# Patient Record
Sex: Female | Born: 1945 | Race: White | Hispanic: No | Marital: Married | Smoking: Never smoker
Health system: Southern US, Community
[De-identification: ages and names within clinical notes are randomized; demographics above are authoritative.]

## PROBLEM LIST (undated history)

## (undated) DIAGNOSIS — R519 Headache, unspecified: Secondary | ICD-10-CM

## (undated) DIAGNOSIS — I5022 Chronic systolic (congestive) heart failure: Secondary | ICD-10-CM

## (undated) DIAGNOSIS — I255 Ischemic cardiomyopathy: Secondary | ICD-10-CM

## (undated) DIAGNOSIS — I2510 Atherosclerotic heart disease of native coronary artery without angina pectoris: Secondary | ICD-10-CM

## (undated) DIAGNOSIS — Z79899 Other long term (current) drug therapy: Secondary | ICD-10-CM

## (undated) DIAGNOSIS — IMO0001 Reserved for inherently not codable concepts without codable children: Secondary | ICD-10-CM

## (undated) DIAGNOSIS — T7840XA Allergy, unspecified, initial encounter: Secondary | ICD-10-CM

## (undated) DIAGNOSIS — I219 Acute myocardial infarction, unspecified: Secondary | ICD-10-CM

## (undated) DIAGNOSIS — L659 Nonscarring hair loss, unspecified: Secondary | ICD-10-CM

## (undated) DIAGNOSIS — Z800 Family history of malignant neoplasm of digestive organs: Secondary | ICD-10-CM

## (undated) DIAGNOSIS — H269 Unspecified cataract: Secondary | ICD-10-CM

## (undated) DIAGNOSIS — E785 Hyperlipidemia, unspecified: Secondary | ICD-10-CM

## (undated) DIAGNOSIS — G4700 Insomnia, unspecified: Secondary | ICD-10-CM

## (undated) DIAGNOSIS — R5381 Other malaise: Secondary | ICD-10-CM

## (undated) DIAGNOSIS — R51 Headache: Secondary | ICD-10-CM

## (undated) DIAGNOSIS — M1990 Unspecified osteoarthritis, unspecified site: Secondary | ICD-10-CM

## (undated) DIAGNOSIS — R5383 Other fatigue: Secondary | ICD-10-CM

## (undated) DIAGNOSIS — Z8249 Family history of ischemic heart disease and other diseases of the circulatory system: Secondary | ICD-10-CM

## (undated) DIAGNOSIS — I10 Essential (primary) hypertension: Secondary | ICD-10-CM

## (undated) HISTORY — DX: Other long term (current) drug therapy: Z79.899

## (undated) HISTORY — DX: Headache: R51

## (undated) HISTORY — DX: Unspecified osteoarthritis, unspecified site: M19.90

## (undated) HISTORY — DX: Insomnia, unspecified: G47.00

## (undated) HISTORY — DX: Essential (primary) hypertension: I10

## (undated) HISTORY — DX: Headache, unspecified: R51.9

## (undated) HISTORY — DX: Other malaise: R53.81

## (undated) HISTORY — DX: Other fatigue: R53.83

## (undated) HISTORY — PX: EYE SURGERY: SHX253

## (undated) HISTORY — DX: Allergy, unspecified, initial encounter: T78.40XA

## (undated) HISTORY — DX: Family history of malignant neoplasm of digestive organs: Z80.0

## (undated) HISTORY — DX: Family history of ischemic heart disease and other diseases of the circulatory system: Z82.49

## (undated) HISTORY — DX: Unspecified cataract: H26.9

## (undated) HISTORY — DX: Hyperlipidemia, unspecified: E78.5

---

## 1974-07-06 HISTORY — PX: DILATION AND CURETTAGE OF UTERUS: SHX78

## 1988-07-06 HISTORY — PX: MASTOIDECTOMY: SHX711

## 1996-07-06 HISTORY — PX: CYSTECTOMY: SHX5119

## 1998-07-17 ENCOUNTER — Other Ambulatory Visit: Admission: RE | Admit: 1998-07-17 | Discharge: 1998-07-17 | Payer: Self-pay | Admitting: Obstetrics and Gynecology

## 1999-03-31 ENCOUNTER — Ambulatory Visit (HOSPITAL_COMMUNITY): Admission: RE | Admit: 1999-03-31 | Discharge: 1999-03-31 | Payer: Self-pay | Admitting: Cardiology

## 1999-07-07 HISTORY — PX: CARDIAC CATHETERIZATION: SHX172

## 1999-10-16 ENCOUNTER — Other Ambulatory Visit: Admission: RE | Admit: 1999-10-16 | Discharge: 1999-10-16 | Payer: Self-pay | Admitting: Obstetrics and Gynecology

## 2000-08-16 ENCOUNTER — Ambulatory Visit (HOSPITAL_COMMUNITY): Admission: RE | Admit: 2000-08-16 | Discharge: 2000-08-16 | Payer: Self-pay | Admitting: Gastroenterology

## 2000-12-01 ENCOUNTER — Other Ambulatory Visit: Admission: RE | Admit: 2000-12-01 | Discharge: 2000-12-01 | Payer: Self-pay | Admitting: Obstetrics and Gynecology

## 2001-12-02 ENCOUNTER — Other Ambulatory Visit: Admission: RE | Admit: 2001-12-02 | Discharge: 2001-12-02 | Payer: Self-pay | Admitting: Obstetrics and Gynecology

## 2002-12-06 ENCOUNTER — Other Ambulatory Visit: Admission: RE | Admit: 2002-12-06 | Discharge: 2002-12-06 | Payer: Self-pay | Admitting: Obstetrics and Gynecology

## 2003-02-02 ENCOUNTER — Encounter: Admission: RE | Admit: 2003-02-02 | Discharge: 2003-02-02 | Payer: Self-pay | Admitting: Family Medicine

## 2003-02-02 ENCOUNTER — Encounter: Payer: Self-pay | Admitting: Family Medicine

## 2003-09-12 ENCOUNTER — Ambulatory Visit (HOSPITAL_COMMUNITY): Admission: RE | Admit: 2003-09-12 | Discharge: 2003-09-12 | Payer: Self-pay | Admitting: Unknown Physician Specialty

## 2003-09-21 ENCOUNTER — Encounter: Admission: RE | Admit: 2003-09-21 | Discharge: 2003-09-21 | Payer: Self-pay | Admitting: Family Medicine

## 2003-09-27 ENCOUNTER — Ambulatory Visit (HOSPITAL_COMMUNITY): Admission: RE | Admit: 2003-09-27 | Discharge: 2003-09-28 | Payer: Self-pay | Admitting: Neurosurgery

## 2003-09-27 HISTORY — PX: CERVICAL SPINE SURGERY: SHX589

## 2003-12-19 ENCOUNTER — Other Ambulatory Visit: Admission: RE | Admit: 2003-12-19 | Discharge: 2003-12-19 | Payer: Self-pay | Admitting: Obstetrics and Gynecology

## 2004-05-16 ENCOUNTER — Encounter: Admission: RE | Admit: 2004-05-16 | Discharge: 2004-05-16 | Payer: Self-pay | Admitting: Family Medicine

## 2004-07-02 ENCOUNTER — Encounter (HOSPITAL_COMMUNITY): Admission: RE | Admit: 2004-07-02 | Discharge: 2004-08-01 | Payer: Self-pay | Admitting: Neurosurgery

## 2004-12-31 ENCOUNTER — Other Ambulatory Visit: Admission: RE | Admit: 2004-12-31 | Discharge: 2004-12-31 | Payer: Self-pay | Admitting: Obstetrics and Gynecology

## 2005-08-05 ENCOUNTER — Ambulatory Visit (HOSPITAL_COMMUNITY): Admission: RE | Admit: 2005-08-05 | Discharge: 2005-08-05 | Payer: Self-pay | Admitting: Family Medicine

## 2006-01-27 ENCOUNTER — Other Ambulatory Visit: Admission: RE | Admit: 2006-01-27 | Discharge: 2006-01-27 | Payer: Self-pay | Admitting: Obstetrics and Gynecology

## 2009-04-30 ENCOUNTER — Encounter: Payer: Self-pay | Admitting: Cardiology

## 2009-05-20 ENCOUNTER — Encounter: Payer: Self-pay | Admitting: Cardiology

## 2009-06-19 DIAGNOSIS — I10 Essential (primary) hypertension: Secondary | ICD-10-CM | POA: Insufficient documentation

## 2009-06-19 DIAGNOSIS — G4700 Insomnia, unspecified: Secondary | ICD-10-CM

## 2009-06-19 DIAGNOSIS — R079 Chest pain, unspecified: Secondary | ICD-10-CM

## 2009-06-20 ENCOUNTER — Encounter: Payer: Self-pay | Admitting: Cardiology

## 2009-07-03 ENCOUNTER — Encounter (INDEPENDENT_AMBULATORY_CARE_PROVIDER_SITE_OTHER): Payer: Self-pay | Admitting: *Deleted

## 2009-07-03 ENCOUNTER — Ambulatory Visit: Payer: Self-pay | Admitting: Cardiology

## 2009-07-03 DIAGNOSIS — R0602 Shortness of breath: Secondary | ICD-10-CM | POA: Insufficient documentation

## 2009-07-08 ENCOUNTER — Ambulatory Visit: Payer: Self-pay | Admitting: Cardiology

## 2009-10-16 ENCOUNTER — Ambulatory Visit: Payer: Self-pay | Admitting: Cardiology

## 2010-08-05 NOTE — Assessment & Plan Note (Signed)
Summary: NP-CHEST PAIN WITH EXERTION   Visit Type:  Initial Consult Primary Provider:  Mauverine Hall,MD  CC:  new patient eval, SOB, and chest pain.  History of Present Illness: the patient is a very pleasant 65 year old female seen by Dr. Margo Aye. The patient has no prior history of coronary artery disease, and has a strong family history. She has an older brother with a heart attack and bypass surgery as well as a younger brother with a myocardial infarction and underwent angioplasty. The patient has very little in the way of risk factors. She does have hypercholesterolemia but her HDL cholesterol is 75 mg percent with an LDL of 120. Based on these data, the patient's Framingham risk for a 10 year heart CHD risk is 4%, with no indication for statin drug therapy the less the LDL cholesterol is greater than 160 mg percent. Of note is also the patient had a cardiac catheterization in 2002 demonstrating no significant coronary artery disease. This procedure was done at the time for chest pain and dyspnea.  The patient's main complaint now is dyspnea which are sitting on him for quite a while. Her dyspnea is both at rest or on exertion. She even describes dyspnea sometimes when turning over in bed. She attributes this to a sedentary lifestyle and a stressful job as an Production designer, theatre/television/film. She does travel in the way of exercise. She also has associated to her increased shortness of breath with weight gain particularly around the waist. She denies however any orthopnea or PND. She has no palpitations or syncope and no exertional chest pain.the patient denies having had a chest X. ray done within the last 5-6 years.   Preventive Screening-Counseling & Management  Alcohol-Tobacco     Smoking Status: never  Current Problems (verified): 1)  Insomnia  (ICD-780.52) 2)  Malaise and Fatigue  (ICD-780.79) 3)  Hormone Replacement Therapy  (ICD-V07.4) 4)  Hypertension  (ICD-401.9) 5)  Chest Pain Unspecified   (ICD-786.50)  Current Medications (verified): 1)  Prempro 0.3-1.5 Mg Tabs (Conj Estrog-Medroxyprogest Ace) .... Take 1 Tablet By Mouth Once A Day 2)  Amlodipine Besylate 5 Mg Tabs (Amlodipine Besylate) .... Take 1 Tablet By Mouth Once A Day 3)  Lunesta 2 Mg Tabs (Eszopiclone) .... 1/4 By Mouth At Bedtime As Needed 4)  Aspir-Low 81 Mg Tbec (Aspirin) .... Take 1 Tablet By Mouth Once A Day 5)  Caltrate 600+d 600-400 Mg-Unit Tabs (Calcium Carbonate-Vitamin D) .... Take 1 Tablet By Mouth Once A Day 6)  B-100  Tabs (Vitamins-Lipotropics) .... Take 1 Tablet By Mouth Once A Day 7)  Vitamin D 2000 Unit Tabs (Cholecalciferol) .... Take 1 Tablet By Mouth Once A Day 8)  Fish Oil Maximum Strength 1200 Mg Caps (Omega-3 Fatty Acids) .... Take 1 Tablet By Mouth Once A Day  Allergies: 1)  ! Demerol 2)  ! Sulfa 3)  ! * Shellfish  Comments:  Nurse/Medical Assistant: The patient's medications and allergies were reviewed with the patient and were updated in the Medication and Allergy Lists. Meds listed on new patient forms.   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) neck surgery with C6-C7 cervical fusion 4-5 yrs ago D&C in 1976 2nd to miscarriage  Family History: Last  updated: 06/19/2009 Mother died age 22 from either MI or stroke father died age 22 (colon cancer) also had a pacemaker father had a stent in leg Brother age 60 who had pancreatic cancer he also had to have a cardiac stent Another brother have cardiac bypass in his 64's positive family hx of thyroid issues in a brother,sister and father  Social History: Last updated: 06/19/2009 Married town Production designer, 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  The patient denies fatigue, malaise, fever, vision loss, decreased hearing, hoarseness, chest pain, palpitations, prolonged cough, wheezing, sleep apnea, coughing up blood, abdominal pain, blood in stool, nausea, vomiting, diarrhea, heartburn, incontinence, blood in urine, muscle weakness, joint pain, leg swelling, rash, skin lesions, headache, fainting, dizziness, depression, anxiety, enlarged lymph nodes, easy bruising or bleeding, and environmental allergies.    Vital Signs:  Patient profile:   65 year old female Height:      65 inches Weight:      152 pounds BMI:     25.39 O2 Sat:      98 % Pulse rate:   60 / minute BP sitting:   143 / 78  (left arm) Cuff size:   regular  Vitals Entered By: Carlye Grippe (July 03, 2009 9:51 AM)  Nutrition Counseling: Patient's BMI is greater than 25 and therefore counseled on weight management options. CC: new patient eval,SOB,chest pain   Physical Exam  General:  Well developed, well nourished, in no acute distress. Head:  normocephalic and atraumatic Eyes:  PERRLA/EOM intact; conjunctiva and lids normal. Mouth:  Teeth, gums and palate normal. Oral mucosa normal. Neck:  Neck supple, no JVD. No masses, thyromegaly or abnormal cervical nodes. Lungs:  Clear bilaterally to auscultation and percussion. Heart:  Non-displaced PMI, chest non-tender; regular rate and rhythm, S1, S2 without murmurs, rubs or gallops. Carotid upstroke normal, no bruit. Normal abdominal aortic size, no bruits. Femorals normal pulses, no bruits. Pedals normal pulses. No edema, no varicosities. Abdomen:  Bowel sounds positive; abdomen soft and non-tender without masses, organomegaly, or hernias noted. No  hepatosplenomegaly. Msk:  Back normal, normal gait. Muscle strength and tone normal. Pulses:  pulses normal in all 4 extremities Extremities:  No clubbing or cyanosis. Neurologic:  Alert and oriented x 3. Skin:  Intact without lesions or rashes. Cervical Nodes:  no significant adenopathy Psych:  Normal affect.   Impression & Recommendations:  Problem # 1:  DYSPNEA (ICD-786.05) the patient reports dyspnea particularly exertion. I doubt this represents ischemic heart disease or primary lung disease. I suspect a large part patient is deconditioned. She does have a strong family history of coronary artery disease in female siblings. However, her Framingham cardiac risk or is still low at 4%. [Risk of myocardial infarction or coronary artery death.]. Based on ATP T. guidelines and her current lipid panel the patient does not meet lipid-lowering therapy. Her pretest probability for coronary artery disease is so low that even coronary artery calcium scoring is not indicated. We will proceed with a exercise echocardiographic study to evaluate the patient's exercise tolerance, make sure she does not have LV dysfunction or underlying heart disease limiting her.we'll also order a chest x-ray PA and lateral Her updated medication list for this problem includes:    Amlodipine Besylate  5 Mg Tabs (Amlodipine besylate) .Marland Kitchen... Take 1 tablet by mouth once a day    Aspir-low 81 Mg Tbec (Aspirin) .Marland Kitchen... Take 1 tablet by mouth once a day  Problem # 2:  HYPERTENSION (ICD-401.9) the patient blood pressure somewhat elevated in the office today. This can be further monitored by Dr. Margo Aye. If it remains elevated I would suggest obtaining a plasma renin activity level which will help guide the appropriate therapy with vasodilator therapy versus diuretic treatment. Her updated medication list for this problem includes:    Amlodipine Besylate 5 Mg Tabs (Amlodipine besylate) .Marland Kitchen... Take 1 tablet by mouth once a day    Aspir-low  81 Mg Tbec (Aspirin) .Marland Kitchen... Take 1 tablet by mouth once a day  Problem # 3:  HORMONE REPLACEMENT THERAPY (ICD-V07.4) Assessment: Comment Only  Other Orders: T-Chest x-ray, 2 views (71020) Echo- Stress (Stress Echo)  Patient Instructions: 1)  Stress Echo 2)  Chest X-ray - PA & Lat. 3)  If all testing normal, okay to cancell follow up appt.  4)  Follow up in  3 months.

## 2010-08-05 NOTE — Letter (Signed)
Summary: Internal Correspondence/ PATIENT HISTORY FORM  Internal Correspondence/ PATIENT HISTORY FORM   Imported By: Dorise Hiss 07/11/2009 14:11:21  _____________________________________________________________________  External Attachment:    Type:   Image     Comment:   External Document

## 2010-08-05 NOTE — Assessment & Plan Note (Signed)
Summary: 3 MONTH F/U PER REMINDER-JM   Visit Type:  Follow-up Primary Provider:  Mauverine Hall,MD  CC:  shortness of breath.  History of Present Illness: the patient is a 65 year old female with no prior stroke coronary arteries but strong family history of CAD. Basal prior evaluation in December calculated the patient's Framingham cardiac risk or at 4%. She did not meet guidelines for lipid-lowering therapy. She underwent a stress echocardiogram which was within normal limits. Ejection fraction is 55-60% and although there were EKG changes suggestive of ischemia the echocardiographic images were normal.  The patient now reports no recurrent chest pain shortness of breath orthopnea PND. She has no palpitations or syncope. She stable from a cardiovascular perspective.  Current Problems (verified): 1)  Dyspnea  (ICD-786.05) 2)  Insomnia  (ICD-780.52) 3)  Malaise and Fatigue  (ICD-780.79) 4)  Hormone Replacement Therapy  (ICD-V07.4) 5)  Hypertension  (ICD-401.9) 6)  Chest Pain Unspecified  (ICD-786.50)  Current Medications (verified): 1)  Prempro 0.3-1.5 Mg Tabs (Conj Estrog-Medroxyprogest Ace) .... Take 1 Tablet By Mouth Once A Day 2)  Amlodipine Besylate 5 Mg Tabs (Amlodipine Besylate) .... Take 1 Tablet By Mouth Once A Day 3)  Lunesta 2 Mg Tabs (Eszopiclone) .... 1/4 By Mouth At Bedtime As Needed 4)  Aspir-Low 81 Mg Tbec (Aspirin) .... Take 1 Tablet By Mouth Once A Day 5)  Caltrate 600+d 600-400 Mg-Unit Tabs (Calcium Carbonate-Vitamin D) .... Take 1 Tablet By Mouth Once A Day 6)  B-100  Tabs (Vitamins-Lipotropics) .... Take 1 Tablet By Mouth Once A Day 7)  Vitamin D 2000 Unit Tabs (Cholecalciferol) .... Take 1 Tablet By Mouth Once A Day 8)  Fish Oil Maximum Strength 1200 Mg Caps (Omega-3 Fatty Acids) .... Take 1 Tablet By Mouth Once A Day 9)  Ocuvite .Marland Kitchen.. 1 By Mouth Daily  Allergies (verified): 1)  ! Demerol 2)  ! Sulfa 3)  ! 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) neck surgery with C6-C7 cervical fusion 4-5 yrs ago D&C in 1976 2nd to miscarriage  Family History: Last updated: Jul 18, 2009 Mother died age 25 from either MI or stroke father died age 66 (colon cancer) also had a pacemaker father had a stent in leg Brother age 84 who had pancreatic cancer he also had to have a cardiac stent Another brother have cardiac bypass in his 75's positive family hx of thyroid issues in a brother,sister and father  Social History: Last updated: Jul 18, 2009 Married town Production designer, 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[place], [place]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 rare glass of wine no drugs Denies no tobacco  Risk Factors: Smoking Status: never (07/03/2009)  Vital Signs:  Patient profile:   65 year old female Height:      65 inches Weight:      154 pounds BMI:     25.72 Pulse rate:   55 / minute Resp:     16 per minute BP sitting:   145 / 72  (left arm)  Vitals Entered By: Marrion Coy, CNA (October 16, 2009 8:32 AM) CC: shortness of breath   Physical Exam  Additional Exam:  General: Well-developed, well-nourished in no distress head: Normocephalic and atraumatic eyes PERRLA/EOMI intact, conjunctiva and lids normal nose:  No deformity or lesions mouth normal dentition, normal posterior pharynx neck: Supple, no JVD.  No masses, thyromegaly or abnormal cervical nodes lungs: Normal breath sounds bilaterally without wheezing.  Normal percussion heart: regular rate and rhythm with normal S1 and S2, no S3 or S4.  PMI is normal.  No pathological murmurs abdomen: Normal bowel sounds, abdomen is soft and nontender without masses, organomegaly or hernias noted.  No  hepatosplenomegaly musculoskeletal: Back normal, normal gait muscle strength and tone normal pulsus: Pulse is normal in all 4 extremities Extremities: No peripheral pitting edema neurologic: Alert and oriented x 3 skin: Intact without lesions or rashes cervical nodes: No significant adenopathy psychologic: Normal affect    Impression & Recommendations:  Problem # 1:  DYSPNEA (ICD-786.05) improved. No evidence of ischemia by stress echocardiography. Her updated medication list for this problem includes:    Amlodipine Besylate 5 Mg Tabs (Amlodipine besylate) .Marland Kitchen... Take 1 tablet by mouth once a day    Aspir-low 81 Mg Tbec (Aspirin) .Marland Kitchen... Take 1 tablet by mouth once a day  Problem # 2:  HYPERTENSION (ICD-401.9) Assessment: Comment Only  Her updated medication list for this problem includes:    Amlodipine Besylate 5 Mg Tabs (Amlodipine besylate) .Marland Kitchen... Take 1 tablet by mouth once a day    Aspir-low 81 Mg Tbec (Aspirin) .Marland Kitchen... Take 1 tablet by mouth once a day  Problem # 3:  CHEST PAIN UNSPECIFIED (ICD-786.50) resolved. Her updated medication list for this problem includes:    Amlodipine Besylate 5 Mg Tabs (Amlodipine besylate) .Marland Kitchen... Take 1 tablet by mouth once a day    Aspir-low 81 Mg Tbec (Aspirin) .Marland Kitchen... Take 1 tablet by mouth once a day  Patient Instructions: 1)  Your physician recommends that you continue on your current medications as directed. Please refer to the Current Medication list given to you today. 2)  No follow up needed.

## 2010-08-13 ENCOUNTER — Telehealth (INDEPENDENT_AMBULATORY_CARE_PROVIDER_SITE_OTHER): Payer: Self-pay | Admitting: *Deleted

## 2010-08-21 NOTE — Progress Notes (Signed)
  Phone Note Other Incoming   Request: Send information Summary of Call: Request for records received from [facility] processing center. Request forwarded to [facility].  5 yrs

## 2010-11-21 NOTE — Op Note (Signed)
NAME:  Kelli Roy, Kelli Roy                           ACCOUNT NO.:  [id number]   MEDICAL RECORD NO.:  [id number]                   PATIENT TYPE:  OIB   LOCATION:  3006                                 FACILITY:  [facility]   PHYSICIAN:  Clydene Fake, M.D.               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   PROCEDURE:  Anterior cervical decompression, diskectomy and fusion, C4-5  with left __________ allograft with tether anterior cervical plate.   SURGEON:  Clydene Fake, M.D.   ASSISTANT:  Hewitt Shorts, M.D.   ANESTHESIA:  General endotracheal tube.   ESTIMATED BLOOD LOSS:  Minimal.   BLOOD GIVEN:  None.   DRAINS:  None.   COMPLICATIONS:  None.   INDICATIONS FOR PROCEDURE:  The patient is a 65 year old woman who had neck,  left shoulder pain, right arm pain and weakness, __________, disk  herniation, left side, C4-5.  The patient did have significant weakness of  the left deltoid radiculopathy.   PROCEDURE IN DETAIL:  The patient was brought to the operating room, general  anesthesia induced. The patient was placed in halter traction, prepped and  draped in the sterile fashion.  The site of incision was injected with 10 cc  of Lidocaine with epinephrine.  An incision was then made from the midline  to the anterior border of the sternocleidomastoid muscle on the left side of  the neck.  The incision was taken down to the platysma and hemostasis was  obtained with Bovie cauterization.  The platysma was incised with the Bovie  and blunt dissection was taken through the anterior cervical fascia to the  anterior cervical spine.  The needle was placed in the interspace, x-ray was  obtained showing this was the 4-5 interspace.  The disk space was incised  with a 15 blade as the needle was removed.  The longus colli muscles were  reflected laterally on each side using the Bovie and self-retaining  retractor system was then placed.  Disk space was then again incised with  the 15 blade and diskectomy performed with pituitary rongeurs and curet's.  Anterior osteophytes were removed with Leksell rongeurs and distraction pins  placed at the C4-5 and the interspace distracted.  Curet's, 1 and 2 mm  Kerrison punches were then used to continue the diskectomy and remove  posterior ligaments and posterior disk herniation, posterior osteophytes.  Bilateral foraminotomies were then performed.  Large fragments of disk were  removed from the left side over the nerve root to where when finished good  central and lateral decompression.  Hemostasis obtained with Gelfoam and  thrombin.  Gelfoam was then irrigated out.  A high speed drill was used to  remove the cartilaginous end plates.  The height of the disk space was  measured.  __________ bone trials measured to be 6 mm.  A 6 mm allograft  bone was then tapped in place, countersinking it about a millimeter or so.  We checked posterior to the graft and there was plenty of room between bone  graft and dura on each side.  Distraction was removed, along with the  distraction pins.  Hemostasis obtained with Gelfoam and thrombin.  Weight  was removed from the traction.  A tethered anterior cervical plate was  placed over the anterior cervical spine, two screws were placed in the C4  and two in the C5.  These were locked down.  Self-retaining retractors were  removed.  Hemostasis obtained with Bipolar cauterization and Gelfoam with  thrombin.  Gelfoam was irrigated out with good hemostasis.  We started the  closure.  X-rays obtained, showing good position of bone plate and screws at  the C4-5 level.  The platysma was closed with 3-0 Vicryl  interrupted suture,  subcutaneous tissue was closed with the same, and the skin closed with  Benzoin and Steri-Strips.  A dressing was placed.  The patient was placed in  a soft cervical collar, awakened from anesthesia and taken to recovery room  in stable condition.                                               Clydene Fake, M.D.    JRH/MEDQ  D:  09/27/2003  T:  09/28/2003  Job:  [id number]

## 2010-11-21 NOTE — Procedures (Signed)
[place]  Patient:    Kelli Roy, Kelli Roy                       MRN: [id number] Proc. Date: 08/16/00 Adm. Date:  08/16/00 Attending:  Verlin Roy, M.D. CC:         Kelli Roy, M.D.   Procedure Report  DATE OF BIRTH:  1946/04/26  REFERRING PHYSICIAN:  Desma Roy, M.D.  PROCEDURE PERFORMED:  Colonoscopy.  ENDOSCOPIST:  Kelli Roy, M.D.  INDICATIONS FOR PROCEDURE:  The patient is a 65 year old female who is referred to me by Dr. Donia Roy to evaluate intermittent hematochezia. Ms. Kelli Roy has been evaluated by Dr. Hassie Roy approximately two years ago and placed on nitroglycerin ointment to heal a fissure.  Ms. Kelli Roy 38 year old brother was recently diagnosed with rectal cancer.  Ms. Kelli Roy viewed our colonoscopy education film.  I discussed with her the complications associated with colonoscopy and polypectomy including intestinal bleeding and intestinal perforation.  Ms. Kelli Roy has signed the operative permit.  CHRONIC MEDICATIONS:  Prempro for hormone replacement therapy.  BC powder for headaches.  Sudafed for sinus congestion.  PAST MEDICAL HISTORY:  Mastectomy in 1989, fibroid cyst of the uterus requiring surgery, anal fissures.  FAMILY HISTORY:  A 17 year old brother diagnosed with rectal cancer a few months ago.  SOCIAL HISTORY:  Ms. Kelli Roy husband is in good health and her 2 year old son is in good health. I discussed with the patient the complications associated with colonoscopy and polypectomy including intestinal bleeding and intestinal perforation.  The patient has signed the operative permit.  PREMEDICATION:  Fentanyl 25 mcg, Versed 5 mg.  ENDOSCOPE:  Olympus pediatric colonoscope.  DESCRIPTION OF PROCEDURE:  After obtaining informed consent, the patient was placed in the left lateral decubitus position.  I administered intravenous fentanyl and intravenous Versed to achieve  sedation for the procedure.  The patients blood pressure, oxygen saturation and cardiac rhythm were monitored throughout the procedure and documented in the medical record.  Anal inspection was normal.  Digital rectal exam was normal.  I did not detect an anal fissure.  The Olympus pediatric colonoscope was then introduced into the rectum and under direct vision, advanced to the cecum as identified by a normal-appearing ileocecal valve.  Colonic preparation for the exam today was excellent.  Rectum:  Normal.  Sigmoid colon:  Normal.  Descending colon:  Normal.  Splenic flexure:  Normal.  Transverse colon:  Normal.  Hepatic flexure:  Normal.  Ascending colon:  Normal.  Cecum and ileocecal valve:  Normal.  ASSESSMENT:  Normal proctocolonoscopy to the cecum.  No endoscopic evidence of for the presence of colorectal neoplasia.  RECOMMENDATIONS:  Repeat colonoscopy in approximately five years based on her brothers diagnosis of rectal cancer. DD:  08/16/00 TD:  08/16/00 Job: [id number]

## 2011-05-25 LAB — HM COLONOSCOPY: HM Colonoscopy: NORMAL

## 2011-08-14 ENCOUNTER — Ambulatory Visit (HOSPITAL_COMMUNITY)
Admission: RE | Admit: 2011-08-14 | Discharge: 2011-08-14 | Disposition: A | Discharge status: Home / Self Care | Payer: BC Managed Care – PPO | Source: Ambulatory Visit | Attending: Internal Medicine | Admitting: Internal Medicine

## 2011-08-14 DIAGNOSIS — M25619 Stiffness of unspecified shoulder, not elsewhere classified: Secondary | ICD-10-CM | POA: Insufficient documentation

## 2011-08-14 DIAGNOSIS — M6281 Muscle weakness (generalized): Secondary | ICD-10-CM | POA: Insufficient documentation

## 2011-08-14 DIAGNOSIS — M25519 Pain in unspecified shoulder: Secondary | ICD-10-CM | POA: Insufficient documentation

## 2011-08-14 DIAGNOSIS — IMO0001 Reserved for inherently not codable concepts without codable children: Secondary | ICD-10-CM | POA: Insufficient documentation

## 2011-08-14 NOTE — Evaluation (Signed)
Occupational Therapy Evaluation  Patient Details  Name: Kelli Roy MRN: [id number] Date of Birth: 1945-11-11  Today's Date: 08/14/2011 Time: 4098-1191 Time Calculation (min): 45 min  Visit#: 1  of 8   Re-eval: 09/11/11  Assessment Diagnosis: Bilateral [place] Bursitis Next MD Visit: August , 2013 Prior Therapy: None  Past Medical History: No past medical history on file. Past Surgical History: No past surgical history on file.  Subjective Symptoms/Limitations Symptoms: S: When I start to go back behind my head. back and neck it get stiff, and hurts a little. Limitations: Ms. Sisk states she had a fall onto left side on 05/2010 and another fall OCt. 2012 on the right side. She states she also had cervical neck surgery to repair a disc approximately 6-7 years ago. She first noticed decreased function in her arms after last fall in October of 2012. Pain Assessment Currently in Pain?: Yes Pain Score:   1 Pain Location: Shoulder Pain Orientation: Right;Left Pain Radiating Towards: Right neck Pain Onset: More than a month ago Pain Frequency: Intermittent Pain Relieving Factors: rest Effect of Pain on Daily Activities: Pain only with end range of ER and elevation. Multiple Pain Sites: No  Precautions/Restrictions  Precautions Precautions:  (None) Restrictions Weight Bearing Restrictions: No  Prior Functioning  Home Living Lives With: Spouse Receives Help From: Family Type of Home: House Home Layout: Two level;Bed/bath upstairs Alternate Level Stairs-Rails: None Alternate Level Stairs-Number of Steps: 3 to get in and 12 inside Home Access: Stairs to enter Entrance Stairs-Rails: None Entrance Stairs-Number of Steps: 2 Bathroom Shower/Tub: Electrical engineer Equipment: None Prior Function Level of Independence: Independent with basic ADLs;Independent with homemaking with ambulation;Independent with gait;Independent with  transfers Able to Take Stairs?: Yes Driving: Yes Vocation: Full time employment 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[employer]) Leisure: Hobbies-no  Assessment ADL/Vision/Perception Perception Perception: Within Functional Limits Praxis Praxis: Intact  Cognition/Observation Cognition Overall Cognitive Status: Appears within functional limits for tasks assessed Arousal/Alertness: Awake/alert Orientation Level: Oriented X4 Safety/Judgment: Appears intact  Sensation/Coordination/Edema  Intact  Additional Assessments RUE Assessment RUE Assessment: Exceptions to [facility] RUE AROM (degrees) Right Shoulder Extension  0-60: 65 Degrees Right Shoulder Flexion  0-170: 142 Degrees Right Shoulder ABduction 0-140: 132 Degrees Right Shoulder Internal Rotation  0-70: 70 Degrees Right Shoulder External Rotation  0-90: 70 Degrees RUE PROM (degrees) Right Shoulder Flexion  0-170: 175 Degrees Right Shoulder ABduction 0-140: 178 Degrees RUE Strength Right Shoulder Flexion: 3+/5 Right Shoulder ABduction: 3+/5 Right Shoulder External Rotation: 4/5 LUE Assessment LUE Assessment: Exceptions to WFL LUE AROM (degrees) Left Shoulder Extension  0-60: 60 Degrees Left Shoulder Flexion  0-170: 140 Degrees Left Shoulder ABduction 0-40: 115 Degrees Left Shoulder Internal Rotation  0-70: 69 Degrees Left Shoulder External Rotation  0-90: 70 Degrees LUE PROM (degrees) Left Shoulder Flexion  0-170: 175 Degrees Left Shoulder ABduction 0-40: 150 Degrees LUE Strength Left Shoulder Flexion: 3+/5 Left Shoulder Extension: 4/5 Left Shoulder ABduction: 3+/5 Palpation Palpation: Patient demonstrates restrictions around scapula      Occupational Therapy Assessment and Plan OT Assessment and Plan Clinical Impression Statement: A: Patient demonstrates tight Scapular and humeral movement with pain at end range of AROM. PROM WNL. ER 4/5 bilaterally IR Flex ABD 3/5 Rehab Potential:  Excellent OT Frequency: Min 2X/week OT Duration: 4 weeks OT Treatment/Interventions: Self-care/ADL training;Therapeutic exercise;Therapeutic activities;Patient/family education;Manual therapy;Modalities OT Plan: P: Skilled OT requred to restore mobility and strength and decrease pain in BUE shoulders and to train  in a HEP for same.   Goals Home Exercise Program Pt will Perform Home Exercise Program: Independently Short Term Goals Time to Complete Short Term Goals: 2 weeks Short Term Goal 1: Patient will be independnt with AAROM and isometrics. Short Term Goal 2: Pateint will state no pain on end range movement into forward flexion and abduction. Short Term Goal 3: Patient will regain 20 degrees AROM of Flex and ABD. Short Term Goal 4: Patient will be able to wash her own hair and put on jewlry with no pain Short Term Goal 5: Patient will demonstrate increased strength in shoulder Flex / Abd to 4/5 Long Term Goals Long Term Goal 1: Patient will be independent with t-band exercises and AAROM and PROM of shoulders. Long Term Goal 2: Pateint will demonstrate 4+/5 Shoulder strength Flex and ABD/ IR/ ER. Long Term Goal 3: Patient will return to full functional use of shoulders reaching above head for light objects without pain. Long Term Goal 4: Patient will have decreased scapular restrictions from Mod to [place].  Problem List Patient Active Problem List  Diagnoses  . HYPERTENSION  . INSOMNIA  . MALAISE AND FATIGUE  . DYSPNEA  . CHEST PAIN UNSPECIFIED    End of Session Activity Tolerance: Patient tolerated treatment well General Behavior During Session: [facility] for tasks performed Cognition: [facility] for tasks performed   Lisa Roca OTR/L 08/14/2011, 12:05 PM  Physician Documentation Your signature is required to indicate approval of the treatment plan as stated above.  Please sign and either send electronically or make a copy of this report for your files and return this physician  signed original.  Please mark one 1.__approve of plan  2. ___approve of plan with the following conditions.   ______________________________                                                          _____________________ Physician Signature                                                                                                             Date

## 2011-08-17 ENCOUNTER — Ambulatory Visit (HOSPITAL_COMMUNITY): Payer: BC Managed Care – PPO | Admitting: Occupational Therapy

## 2011-08-18 ENCOUNTER — Telehealth (HOSPITAL_COMMUNITY): Payer: Self-pay

## 2011-08-18 ENCOUNTER — Ambulatory Visit (HOSPITAL_COMMUNITY): Payer: BC Managed Care – PPO | Admitting: Occupational Therapy

## 2011-08-20 ENCOUNTER — Ambulatory Visit (HOSPITAL_COMMUNITY): Payer: BC Managed Care – PPO | Admitting: Occupational Therapy

## 2011-08-24 ENCOUNTER — Ambulatory Visit (HOSPITAL_COMMUNITY): Payer: BC Managed Care – PPO | Admitting: Occupational Therapy

## 2011-08-27 ENCOUNTER — Ambulatory Visit (HOSPITAL_COMMUNITY)
Admission: RE | Admit: 2011-08-27 | Discharge: 2011-08-27 | Disposition: A | Discharge status: Home / Self Care | Payer: BC Managed Care – PPO | Source: Ambulatory Visit | Attending: Internal Medicine | Admitting: Internal Medicine

## 2011-08-27 NOTE — Progress Notes (Signed)
Occupational Therapy Treatment  Patient Details  Name: JEREMIE ABDELAZIZ MRN: [id number] Date of Birth: 02-21-1946  Today's Date: 08/27/2011 Time: 1100-1145 Therapeutic Exercise 45 min. Time Calculation (min): 45 min  Visit#: 2  of 8   Re-eval: 09/11/11   Subjective Symptoms/Limitations Symptoms: S: I was unable to come due to a sinus infection, and had so things come upat work that made it hard to get away." Repetition: Decreases Symptoms Pain Assessment Currently in Pain?: Yes Pain Score:   1 Pain Location: Shoulder Pain Orientation: Right;Left Pain Onset: More than a month ago Pain Frequency: Rarely Pain Relieving Factors: Rest  Precautions/Restrictions None    Exercise/Treatments Supine Protraction: AAROM;AROM;10 reps;PROM Horizontal ABduction: AAROM;AROM;10 reps;PROM External Rotation: AAROM;AROM;10 reps;PROM Internal Rotation: AAROM;AROM;10 reps;PROM Flexion: AROM;AAROM;10 reps;PROM ABduction: AROM;AAROM;10 reps;PROM Ball Flexion: 20 reps ABduction: 20 reps Right/Left: 5 reps ROM / Strengthening / Isometric Strengthening UBE (Upper Arm Bike): 3' forward 3' backward. level1 Wall Wash: 2'  Occupational Therapy Assessment and Plan OT Assessment and Plan Clinical Impression Statement: A: Decreased tightness and decreased discomfort today throughout range bilateral shoulders. Patient given supine AROM and wall wash and ball AAROM seated. Rehab Potential: Excellent OT Frequency: Min 2X/week OT Duration: 4 weeks OT Treatment/Interventions: Self-care/ADL training;Therapeutic exercise;Manual therapy;Modalities;Therapeutic activities;Patient/family education OT Plan: P: Begin training in sometrics and continue scapular strengthening.   Goals Home Exercise Program Pt will Perform Home Exercise Program: Independently Short Term Goals Time to Complete Short Term Goals: 2 weeks Short Term Goal 1: Patient will be independnt with AAROM and isometrics. Short Term Goal 2:  Pateint will state no pain on end range movement into forward flexion and abduction. Short Term Goal 3: Patient will regain 20 degrees AROM of Flex and ABD. Short Term Goal 4: Patient will be able to wash her own hair and put on jewlry with no pain Short Term Goal 5: Patient will demonstrate increased strength in shoulder Flex / Abd to 4/5 Long Term Goals Long Term Goal 1: Patient will be independent with t-band exercises and AAROM and PROM of shoulders. Long Term Goal 2: Pateint will demonstrate 4+/5 Shoulder strength Flex and ABD/ IR/ ER. Long Term Goal 3: Patient will return to full functional use of shoulders reaching above head for light objects without pain. Long Term Goal 4: Patient will have decreased scapular restrictions from Mod to [place].  Problem List Patient Active Problem List  Diagnoses  . HYPERTENSION  . INSOMNIA  . MALAISE AND FATIGUE  . DYSPNEA  . CHEST PAIN UNSPECIFIED    End of Session Activity Tolerance: Patient tolerated treatment well General Behavior During Session: [facility] for tasks performed Cognition: [facility] for tasks performed  Lisa Roca OTR/L 08/27/2011, 11:52 AM

## 2011-08-31 ENCOUNTER — Ambulatory Visit (HOSPITAL_COMMUNITY)
Admission: RE | Admit: 2011-08-31 | Discharge: 2011-08-31 | Disposition: A | Discharge status: Home / Self Care | Payer: BC Managed Care – PPO | Source: Ambulatory Visit | Attending: Internal Medicine | Admitting: Internal Medicine

## 2011-08-31 NOTE — Progress Notes (Signed)
Occupational Therapy Treatment  Patient Details  Name: Kelli Roy MRN: [id number] Date of Birth: 07/20/1945  Today's Date: 08/31/2011 Time: 4098-1191 Time Calculation (min): 58 min  Visit#: 3  of 8   Re-eval: 09/11/11 Manual Therapy [phone] 27' Therapeutic Exercise [id number] 30'    Subjective Symptoms/Limitations Symptoms: S: I am more limited by my range of motion.  Precautions/Restrictions     Exercise/Treatments Supine Protraction: PROM;10 reps;AAROM;15 reps Horizontal ABduction: PROM;10 reps;AAROM;15 reps External Rotation: PROM;10 reps;AAROM;15 reps Internal Rotation: PROM;10 reps;AAROM;15 reps Flexion: PROM;10 reps;AAROM;15 reps ABduction: PROM;10 reps;AAROM;15 reps Seated Elevation: AROM;10 reps Extension: AROM;10 reps Retraction: AROM;10 reps Row: AROM;10 reps Therapy Ball Flexion: 20 reps ABduction: 20 reps Right/Left: 5 reps ROM / Strengthening / Isometric Strengthening UBE (Upper Arm Bike): 3' forward 3' backward. level1.5 Wall Wash: 2' Thumb Tacks: 1'      Manual Therapy Manual Therapy: Myofascial release Myofascial Release: MFR and manual stretching to bilateral shoulders to decrease restrictions along scapula, upper trap and upper arm to decrease pain and increase AROM.  Occupational Therapy Assessment and Plan OT Assessment and Plan Clinical Impression Statement: A:  Added multiple new exercises which patient tolerated well. Rehab Potential: Excellent OT Plan: P:  Add tband for scapular strengthening.   Goals Home Exercise Program Pt will Perform Home Exercise Program: Independently Short Term Goals Time to Complete Short Term Goals: 2 weeks Short Term Goal 1: Patient will be independnt with AAROM and isometrics. Short Term Goal 2: Pateint will state no pain on end range movement into forward flexion and abduction. Short Term Goal 3: Patient will regain 20 degrees AROM of Flex and ABD. Short Term Goal 4: Patient will be able to wash her  own hair and put on jewlry with no pain Short Term Goal 5: Patient will demonstrate increased strength in shoulder Flex / Abd to 4/5 Long Term Goals Long Term Goal 1: Patient will be independent with t-band exercises and AAROM and PROM of shoulders. Long Term Goal 2: Pateint will demonstrate 4+/5 Shoulder strength Flex and ABD/ IR/ ER. Long Term Goal 3: Patient will return to full functional use of shoulders reaching above head for light objects without pain. Long Term Goal 4: Patient will have decreased scapular restrictions from Mod to [place].  Problem List Patient Active Problem List  Diagnoses  . HYPERTENSION  . INSOMNIA  . MALAISE AND FATIGUE  . DYSPNEA  . CHEST PAIN UNSPECIFIED    End of Session Activity Tolerance: Patient tolerated treatment well General Behavior During Session: [facility] for tasks performed Cognition: [facility] for tasks performed  GO No functional reporting required   Deneka Greenwalt L. Noralee Stain, COTA/L  08/31/2011, 5:56 PM

## 2011-09-03 ENCOUNTER — Ambulatory Visit (HOSPITAL_COMMUNITY): Payer: BC Managed Care – PPO | Admitting: Occupational Therapy

## 2011-09-07 ENCOUNTER — Ambulatory Visit (HOSPITAL_COMMUNITY)
Admission: RE | Admit: 2011-09-07 | Discharge: 2011-09-07 | Disposition: A | Discharge status: Home / Self Care | Payer: BC Managed Care – PPO | Source: Ambulatory Visit | Attending: Internal Medicine | Admitting: Internal Medicine

## 2011-09-07 DIAGNOSIS — M25519 Pain in unspecified shoulder: Secondary | ICD-10-CM | POA: Insufficient documentation

## 2011-09-07 DIAGNOSIS — IMO0001 Reserved for inherently not codable concepts without codable children: Secondary | ICD-10-CM | POA: Insufficient documentation

## 2011-09-07 DIAGNOSIS — M25619 Stiffness of unspecified shoulder, not elsewhere classified: Secondary | ICD-10-CM | POA: Insufficient documentation

## 2011-09-07 DIAGNOSIS — M6281 Muscle weakness (generalized): Secondary | ICD-10-CM | POA: Insufficient documentation

## 2011-09-07 NOTE — Progress Notes (Signed)
Occupational Therapy Treatment  Patient Details  Name: Kelli Roy MRN: [id number] Date of Birth: [birth year]  Today's Date: 09/07/2011 Time: 8295-6213 Time Calculation (min): 44 min  Visit#: 4  of 8   Re-eval: 09/11/11 Manual Therapy [phone] 18' Therapeutic Exercise [id number] 25'    Subjective Symptoms/Limitations Symptoms: S:  I feel ok.  The ROM does not seem quite as stiff. Pain Assessment Currently in Pain?: No/denies  Precautions/Restrictions     Exercise/Treatments Supine Protraction: PROM;10 reps;AAROM;15 reps Horizontal ABduction: PROM;10 reps;AAROM;15 reps External Rotation: PROM;10 reps;AAROM;15 reps Internal Rotation: PROM;10 reps;AAROM;15 reps Flexion: PROM;10 reps;AAROM;15 reps ABduction: PROM;10 reps;AAROM;15 reps Seated Extension: Theraband;10 reps Theraband Level (Shoulder Extension): Level 3 (Green) Retraction: Theraband;10 reps Theraband Level (Shoulder Retraction): Level 3 (Green) Row: Theraband;10 reps Theraband Level (Shoulder Row): Level 3 (Green) Therapy Ball Flexion: 20 reps ABduction: 20 reps Right/Left: 5 reps ROM / Strengthening / Isometric Strengthening UBE (Upper Arm Bike): 3' forward 3' backward. level1.5 Wall Wash: 3' Thumb Tacks: 1'          Manual Therapy Manual Therapy: Myofascial release Myofascial Release: MFR and manual stretching to bilateral shoulders to decrease restrictions along scapula, upper trap and upper arm to decrease pain and increase AROM.  Occupational Therapy Assessment and Plan OT Assessment and Plan Clinical Impression Statement: A:  Added tband to scapular strengthening.  Good PROM Rehab Potential: Excellent OT Plan: P:  switch from AAROM to AROM supine.   Goals Home Exercise Program Pt will Perform Home Exercise Program: Independently Short Term Goals Time to Complete Short Term Goals: 2 weeks Short Term Goal 1: Patient will be independnt with AAROM and isometrics. Short Term Goal 2: Pateint  will state no pain on end range movement into forward flexion and abduction. Short Term Goal 3: Patient will regain 20 degrees AROM of Flex and ABD. Short Term Goal 4: Patient will be able to wash her own hair and put on jewlry with no pain Short Term Goal 5: Patient will demonstrate increased strength in shoulder Flex / Abd to 4/5 Long Term Goals Long Term Goal 1: Patient will be independent with t-band exercises and AAROM and PROM of shoulders. Long Term Goal 2: Pateint will demonstrate 4+/5 Shoulder strength Flex and ABD/ IR/ ER. Long Term Goal 3: Patient will return to full functional use of shoulders reaching above head for light objects without pain. Long Term Goal 4: Patient will have decreased scapular restrictions from Mod to [place].  Problem List Patient Active Problem List  Diagnoses  . HYPERTENSION  . INSOMNIA  . MALAISE AND FATIGUE  . DYSPNEA  . CHEST PAIN UNSPECIFIED    End of Session Activity Tolerance: Patient tolerated treatment well General Behavior During Session: [facility] for tasks performed Cognition: [facility] for tasks performed  GO No functional reporting required   Erdem Naas L. Noralee Stain, COTA/L  09/07/2011, 6:16 PM

## 2011-09-10 ENCOUNTER — Ambulatory Visit (HOSPITAL_COMMUNITY)
Admission: RE | Admit: 2011-09-10 | Discharge: 2011-09-10 | Disposition: A | Discharge status: Home / Self Care | Payer: BC Managed Care – PPO | Source: Ambulatory Visit | Attending: Internal Medicine | Admitting: Internal Medicine

## 2011-09-10 NOTE — Progress Notes (Signed)
Occupational Therapy Treatment  Patient Details  Name: TWANISHA FOULK MRN: [id number] Date of Birth: Jul 20, 1945  Today's Date: 09/10/2011 Time: 1478-2956 Time Calculation (min): 59 min  Visit#: 5  of 8   Re-eval: 09/11/11 Manual Therapy [phone] 34' Therapeutic Exercise [id number] 24'    Subjective Symptoms/Limitations Symptoms: S:  I wake up all night I need a new mattress. Pain Assessment Currently in Pain?: No/denies  Precautions/Restrictions     Exercise/Treatments Supine Protraction: PROM;AROM;10 reps Horizontal ABduction: PROM;AROM;10 reps External Rotation: PROM;AROM;10 reps Internal Rotation: PROM;AROM;10 reps Flexion: PROM;AROM;10 reps ABduction: PROM;AROM;10 reps Seated Extension: Theraband;12 reps Theraband Level (Shoulder Extension): Level 3 (Green) Retraction: Theraband;12 reps Theraband Level (Shoulder Retraction): Level 3 (Green) Row: Theraband;12 reps Theraband Level (Shoulder Row): Level 3 (Green) Therapy Ball Flexion: 20 reps ABduction: 20 reps Right/Left: 5 reps ROM / Strengthening / Isometric Strengthening UBE (Upper Arm Bike): 3' forward 3' backward. level1.5 Wall Wash: 3' Thumb Tacks: 1'         Manual Therapy Manual Therapy: Myofascial release Myofascial Release: MFR and manual stretching to bilateral shoulders to decrease restrictions along scapula, upper trap and upper arm to decrease pain and increase AROM.  Occupational Therapy Assessment and Plan OT Assessment and Plan Clinical Impression Statement: A: Full PROM  Switched from AAROM to AROM Rehab Potential: Excellent OT Plan: P: REASSESS   Goals Home Exercise Program Pt will Perform Home Exercise Program: Independently Short Term Goals Time to Complete Short Term Goals: 2 weeks Short Term Goal 1: Patient will be independnt with AAROM and isometrics. Short Term Goal 2: Pateint will state no pain on end range movement into forward flexion and abduction. Short Term Goal 3: Patient  will regain 20 degrees AROM of Flex and ABD. Short Term Goal 4: Patient will be able to wash her own hair and put on jewlry with no pain Short Term Goal 5: Patient will demonstrate increased strength in shoulder Flex / Abd to 4/5 Long Term Goals Long Term Goal 1: Patient will be independent with t-band exercises and AAROM and PROM of shoulders. Long Term Goal 2: Pateint will demonstrate 4+/5 Shoulder strength Flex and ABD/ IR/ ER. Long Term Goal 3: Patient will return to full functional use of shoulders reaching above head for light objects without pain. Long Term Goal 4: Patient will have decreased scapular restrictions from Mod to [place].  Problem List Patient Active Problem List  Diagnoses  . HYPERTENSION  . INSOMNIA  . MALAISE AND FATIGUE  . DYSPNEA  . CHEST PAIN UNSPECIFIED    End of Session Activity Tolerance: Patient tolerated treatment well General Behavior During Session: [facility] for tasks performed Cognition: [facility] for tasks performed  GO No functional reporting required   Lalia Loudon L. Noralee Stain, COTA/L  09/10/2011, 6:14 PM

## 2011-09-14 ENCOUNTER — Ambulatory Visit (HOSPITAL_COMMUNITY)
Admission: RE | Admit: 2011-09-14 | Discharge: 2011-09-14 | Disposition: A | Discharge status: Home / Self Care | Payer: BC Managed Care – PPO | Source: Ambulatory Visit | Attending: Internal Medicine | Admitting: Internal Medicine

## 2011-09-14 NOTE — Progress Notes (Signed)
Occupational Therapy Treatment  Patient Details  Name: BRITANNI YARDE MRN: [id number] Date of Birth: 1946-03-22  Today's Date: 09/14/2011 Time: 9629-5284 Time Calculation (min): 56 min  Visit#: 6  of 16   Re-eval: 10/12/11 Manual Therapy [phone] 17' Therapeutic Exercise [phone] 19'    Subjective Symptoms/Limitations Symptoms: S:  It feels good. Pain Assessment Currently in Pain?: No/denies  Precautions/Restrictions     Exercise/Treatments Supine Protraction: PROM;Strengthening;10 reps Protraction Weight (lbs): 1# Horizontal ABduction: PROM;Strengthening;10 reps Horizontal ABduction Weight (lbs): 1# External Rotation: PROM;Strengthening;10 reps External Rotation Weight (lbs): 1# Internal Rotation: PROM;Strengthening;10 reps Internal Rotation Weight (lbs): 1# Flexion: PROM;Strengthening;10 reps Shoulder Flexion Weight (lbs): 1# ABduction: PROM;Strengthening;10 reps Shoulder ABduction Weight (lbs): 1# Seated Extension: Theraband;12 reps Theraband Level (Shoulder Extension): Level 3 (Green) Retraction: Theraband;12 reps Theraband Level (Shoulder Retraction): Level 3 (Green) Row: Theraband;12 reps Theraband Level (Shoulder Row): Level 3 (Green) Protraction: Strengthening;10 reps Protraction Weight (lbs): 1# Horizontal ABduction: Strengthening;10 reps Horizontal ABduction Weight (lbs): 1# External Rotation: Strengthening;10 reps External Rotation Weight (lbs): 1# Internal Rotation: Strengthening;10 reps Internal Rotation Weight (lbs): 1# Flexion: Strengthening;10 reps Flexion Weight (lbs): 1# Abduction: Strengthening;10 reps ABduction Weight (lbs): 1#   Therapy Ball Flexion: 20 reps ABduction: 20 reps Right/Left: 5 reps ROM / Strengthening / Isometric Strengthening UBE (Upper Arm Bike): 3' forward 3' backward. level 2.0 Wall Wash: 4' Thumb Tacks: 1' Prot/Ret//Elev/Dep: 1'      Manual Therapy Manual Therapy: Myofascial release Myofascial Release: MFR and  manual stretching to bilateral shoulders to decrease restrictions along scapula, upper trap and upper arm to decrease pain and increase AROM.  Occupational Therapy Assessment and Plan OT Assessment and Plan Clinical Impression Statement: A:  See progress note. Rehab Potential: Excellent OT Plan: P:  Continue 2x a week for 4 weeks.  Add tband to HEP.   Goals Home Exercise Program Pt will Perform Home Exercise Program: Independently Short Term Goals Time to Complete Short Term Goals: 2 weeks Short Term Goal 1: Patient will be independnt with AAROM and isometrics. Short Term Goal 1 Progress: Met Short Term Goal 2: Pateint will state no pain on end range movement into forward flexion and abduction. Short Term Goal 2 Progress: Met Short Term Goal 3: Patient will regain 20 degrees AROM of Flex and ABD. Short Term Goal 3 Progress: Met Short Term Goal 4: Patient will be able to wash her own hair and put on jewlry with no pain Short Term Goal 4 Progress: Met Short Term Goal 5: Patient will demonstrate increased strength in shoulder Flex / Abd to 4/5 Short Term Goal 5 Progress: Met Long Term Goals Long Term Goal 1: Patient will be independent with t-band exercises and AAROM and PROM of shoulders. Long Term Goal 1 Progress: Progressing toward goal Long Term Goal 2: Pateint will demonstrate 4+/5 Shoulder strength Flex and ABD/ IR/ ER. Long Term Goal 2 Progress: Met Long Term Goal 3: Patient will return to full functional use of shoulders reaching above head for light objects without pain. Long Term Goal 3 Progress: Met Long Term Goal 4: Patient will have decreased scapular restrictions from Mod to [place]. Long Term Goal 4 Progress: Met  Problem List Patient Active Problem List  Diagnoses  . HYPERTENSION  . INSOMNIA  . MALAISE AND FATIGUE  . DYSPNEA  . CHEST PAIN UNSPECIFIED    End of Session Activity Tolerance: Patient tolerated treatment well General Behavior During Session: [facility] for  tasks performed Cognition: [facility] for tasks performed  GO No functional reporting  required   Abhay Godbolt L. Noralee Stain, COTA/L  09/14/2011, 6:08 PM

## 2011-09-17 ENCOUNTER — Ambulatory Visit (HOSPITAL_COMMUNITY)
Admission: RE | Admit: 2011-09-17 | Discharge: 2011-09-17 | Disposition: A | Discharge status: Home / Self Care | Payer: BC Managed Care – PPO | Source: Ambulatory Visit | Attending: Internal Medicine | Admitting: Internal Medicine

## 2011-09-17 NOTE — Progress Notes (Signed)
Occupational Therapy Treatment  Patient Details  Name: Kelli Roy MRN: [id number] Date of Birth: 06-Sep-1945  Today's Date: 09/17/2011 Time: 1700-1746 Time Calculation (min): 46 min  Visit#: 7  of 16   Re-eval: 10/12/11 Manual Therapy 500-519 19' Therapeutic Exercise 520-546 26'    Subjective Symptoms/Limitations Symptoms: S:  Unless I move it doesn't hurt. Pain Assessment Currently in Pain?: No/denies  Precautions/Restrictions     Exercise/Treatments Supine Protraction: PROM;Strengthening;10 reps Protraction Weight (lbs): 2# Horizontal ABduction: PROM;Strengthening;10 reps Horizontal ABduction Weight (lbs): 2# External Rotation: PROM;Strengthening;10 reps External Rotation Weight (lbs): 2# Internal Rotation: PROM;Strengthening;10 reps Internal Rotation Weight (lbs): 2# Flexion: PROM;Strengthening;10 reps Shoulder Flexion Weight (lbs): 2# ABduction: PROM;Strengthening;10 reps Shoulder ABduction Weight (lbs): 2# Seated Extension: Theraband;15 reps Theraband Level (Shoulder Extension): Level 3 (Green) Retraction: Theraband;15 reps Theraband Level (Shoulder Retraction): Level 3 (Green) Row: Theraband;15 reps Theraband Level (Shoulder Row): Level 3 (Green) Protraction: Strengthening;10 reps Protraction Weight (lbs): 1# Horizontal ABduction: Strengthening;10 reps Horizontal ABduction Weight (lbs): 1# External Rotation: Strengthening;10 reps;Theraband;15 reps Theraband Level (Shoulder External Rotation): Level 3 (Green) External Rotation Weight (lbs): 1# Internal Rotation: Strengthening;10 reps;Theraband;15 reps Theraband Level (Shoulder Internal Rotation): Level 3 (Green) Internal Rotation Weight (lbs): 1# Flexion: Strengthening;10 reps Flexion Weight (lbs): 1# Abduction: Strengthening;10 reps ABduction Weight (lbs): 1# Therapy Ball Flexion: 20 reps ABduction: 20 reps Right/Left: 5 reps ROM / Strengthening / Isometric Strengthening UBE (Upper Arm Bike): 3'  forward 3' backward. level 2.5 Wall Wash: 4' Thumb Tacks: 1'       Manual Therapy Manual Therapy: Myofascial release Myofascial Release: MFR and manual stretching to bilateral shoulders to decrease restrictions along scapula, upper trap and upper arm to decrease pain and increase AROM.  Occupational Therapy Assessment and Plan OT Assessment and Plan Clinical Impression Statement: A:  Increased supine to 2#.   Rehab Potential: Excellent OT Plan: P:  Add cybex press and row.   Goals Home Exercise Program Pt will Perform Home Exercise Program: Independently Short Term Goals Time to Complete Short Term Goals: 2 weeks Short Term Goal 1: Patient will be independnt with AAROM and isometrics. Short Term Goal 2: Pateint will state no pain on end range movement into forward flexion and abduction. Short Term Goal 3: Patient will regain 20 degrees AROM of Flex and ABD. Short Term Goal 4: Patient will be able to wash her own hair and put on jewlry with no pain Short Term Goal 5: Patient will demonstrate increased strength in shoulder Flex / Abd to 4/5 Long Term Goals Long Term Goal 1: Patient will be independent with t-band exercises and AAROM and PROM of shoulders. Long Term Goal 2: Pateint will demonstrate 4+/5 Shoulder strength Flex and ABD/ IR/ ER. Long Term Goal 3: Patient will return to full functional use of shoulders reaching above head for light objects without pain. Long Term Goal 4: Patient will have decreased scapular restrictions from Mod to [place].  Problem List Patient Active Problem List  Diagnoses  . HYPERTENSION  . INSOMNIA  . MALAISE AND FATIGUE  . DYSPNEA  . CHEST PAIN UNSPECIFIED    End of Session Activity Tolerance: Patient tolerated treatment well General Behavior During Session: [facility] for tasks performed Cognition: [facility] for tasks performed  GO No functional reporting required   Abundio Teuscher L. Noralee Stain, COTA/L  09/17/2011, 5:44 PM

## 2012-01-21 ENCOUNTER — Encounter: Payer: Self-pay | Admitting: *Deleted

## 2012-01-21 ENCOUNTER — Encounter: Payer: Self-pay | Admitting: Internal Medicine

## 2012-02-08 ENCOUNTER — Other Ambulatory Visit: Payer: Self-pay

## 2012-02-08 DIAGNOSIS — R0602 Shortness of breath: Secondary | ICD-10-CM

## 2012-02-18 ENCOUNTER — Ambulatory Visit (HOSPITAL_COMMUNITY)
Admission: RE | Admit: 2012-02-18 | Discharge: 2012-02-18 | Disposition: A | Discharge status: Home / Self Care | Payer: BC Managed Care – PPO | Source: Ambulatory Visit | Attending: Internal Medicine | Admitting: Internal Medicine

## 2012-02-18 DIAGNOSIS — R0602 Shortness of breath: Secondary | ICD-10-CM | POA: Insufficient documentation

## 2012-02-24 NOTE — Procedures (Signed)
NAME:  MANSI, TOKAR                 ACCOUNT NO.:  [id number]  MEDICAL RECORD NO.:  [id number]  LOCATION:                                 FACILITY:  PHYSICIAN:  Artrell Lawless L. Juanetta Gosling, M.D.DATE OF BIRTH:  Aug 11, 1945  DATE OF PROCEDURE:  02/23/2012 DATE OF DISCHARGE:                           PULMONARY FUNCTION TEST   Reason for pulmonary function testing is shortness of breath.  1. Spirometry shows no definite ventilatory defect or airflow     obstruction. 2. Lung volumes show mild restrictive change. 3. DLCO is mildly reduced, it does correct some when volume is     accounted for. 4. Airway resistance is slightly high which may indicate airflow     obstruction that is not seen on spirometry. 5. 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     Tawfiq Favila L. Juanetta Gosling, M.D.     ELH/MEDQ  D:  02/23/2012  T:  02/24/2012  Job:  [id number]  cc:   Juline Patch, M.D. Fax: [phone]

## 2012-03-29 LAB — PULMONARY FUNCTION TEST

## 2012-04-06 DIAGNOSIS — D239 Other benign neoplasm of skin, unspecified: Secondary | ICD-10-CM | POA: Diagnosis not present

## 2012-04-06 DIAGNOSIS — L821 Other seborrheic keratosis: Secondary | ICD-10-CM | POA: Diagnosis not present

## 2012-04-06 DIAGNOSIS — L989 Disorder of the skin and subcutaneous tissue, unspecified: Secondary | ICD-10-CM | POA: Diagnosis not present

## 2012-04-08 DIAGNOSIS — Z1231 Encounter for screening mammogram for malignant neoplasm of breast: Secondary | ICD-10-CM | POA: Diagnosis not present

## 2012-06-08 DIAGNOSIS — I10 Essential (primary) hypertension: Secondary | ICD-10-CM | POA: Diagnosis not present

## 2012-06-13 DIAGNOSIS — M899 Disorder of bone, unspecified: Secondary | ICD-10-CM | POA: Diagnosis not present

## 2012-06-13 DIAGNOSIS — G4700 Insomnia, unspecified: Secondary | ICD-10-CM | POA: Diagnosis not present

## 2012-06-13 DIAGNOSIS — I10 Essential (primary) hypertension: Secondary | ICD-10-CM | POA: Diagnosis not present

## 2012-06-13 DIAGNOSIS — M949 Disorder of cartilage, unspecified: Secondary | ICD-10-CM | POA: Diagnosis not present

## 2012-06-20 DIAGNOSIS — H524 Presbyopia: Secondary | ICD-10-CM | POA: Diagnosis not present

## 2012-06-20 DIAGNOSIS — H353190 Nonexudative age-related macular degeneration, unspecified eye, stage unspecified: Secondary | ICD-10-CM | POA: Diagnosis not present

## 2012-06-20 DIAGNOSIS — H5200 Hypermetropia, unspecified eye: Secondary | ICD-10-CM | POA: Diagnosis not present

## 2012-06-20 DIAGNOSIS — H52229 Regular astigmatism, unspecified eye: Secondary | ICD-10-CM | POA: Diagnosis not present

## 2012-07-12 DIAGNOSIS — I10 Essential (primary) hypertension: Secondary | ICD-10-CM | POA: Diagnosis not present

## 2012-07-18 DIAGNOSIS — Z124 Encounter for screening for malignant neoplasm of cervix: Secondary | ICD-10-CM | POA: Diagnosis not present

## 2012-07-18 DIAGNOSIS — Z0000 Encounter for general adult medical examination without abnormal findings: Secondary | ICD-10-CM | POA: Diagnosis not present

## 2012-07-18 DIAGNOSIS — Z01419 Encounter for gynecological examination (general) (routine) without abnormal findings: Secondary | ICD-10-CM | POA: Diagnosis not present

## 2012-07-28 DIAGNOSIS — I10 Essential (primary) hypertension: Secondary | ICD-10-CM | POA: Diagnosis not present

## 2012-07-29 DIAGNOSIS — L259 Unspecified contact dermatitis, unspecified cause: Secondary | ICD-10-CM | POA: Diagnosis not present

## 2012-08-01 DIAGNOSIS — L259 Unspecified contact dermatitis, unspecified cause: Secondary | ICD-10-CM | POA: Diagnosis not present

## 2012-08-02 DIAGNOSIS — I10 Essential (primary) hypertension: Secondary | ICD-10-CM | POA: Diagnosis not present

## 2012-08-05 DIAGNOSIS — L259 Unspecified contact dermatitis, unspecified cause: Secondary | ICD-10-CM | POA: Diagnosis not present

## 2012-11-03 DIAGNOSIS — R609 Edema, unspecified: Secondary | ICD-10-CM | POA: Diagnosis not present

## 2012-11-30 DIAGNOSIS — M79609 Pain in unspecified limb: Secondary | ICD-10-CM | POA: Diagnosis not present

## 2012-11-30 DIAGNOSIS — I10 Essential (primary) hypertension: Secondary | ICD-10-CM | POA: Diagnosis not present

## 2012-11-30 DIAGNOSIS — R0609 Other forms of dyspnea: Secondary | ICD-10-CM | POA: Diagnosis not present

## 2012-11-30 DIAGNOSIS — R0989 Other specified symptoms and signs involving the circulatory and respiratory systems: Secondary | ICD-10-CM | POA: Diagnosis not present

## 2012-11-30 DIAGNOSIS — M1710 Unilateral primary osteoarthritis, unspecified knee: Secondary | ICD-10-CM | POA: Diagnosis not present

## 2013-04-10 DIAGNOSIS — Z1231 Encounter for screening mammogram for malignant neoplasm of breast: Secondary | ICD-10-CM | POA: Diagnosis not present

## 2013-04-26 DIAGNOSIS — L909 Atrophic disorder of skin, unspecified: Secondary | ICD-10-CM | POA: Diagnosis not present

## 2013-04-26 DIAGNOSIS — L821 Other seborrheic keratosis: Secondary | ICD-10-CM | POA: Diagnosis not present

## 2013-04-26 DIAGNOSIS — D239 Other benign neoplasm of skin, unspecified: Secondary | ICD-10-CM | POA: Diagnosis not present

## 2013-06-05 DIAGNOSIS — I10 Essential (primary) hypertension: Secondary | ICD-10-CM | POA: Diagnosis not present

## 2013-06-05 DIAGNOSIS — Z1331 Encounter for screening for depression: Secondary | ICD-10-CM | POA: Diagnosis not present

## 2013-06-05 DIAGNOSIS — M1990 Unspecified osteoarthritis, unspecified site: Secondary | ICD-10-CM | POA: Diagnosis not present

## 2013-06-05 DIAGNOSIS — E7800 Pure hypercholesterolemia, unspecified: Secondary | ICD-10-CM | POA: Diagnosis not present

## 2013-06-05 DIAGNOSIS — E559 Vitamin D deficiency, unspecified: Secondary | ICD-10-CM | POA: Diagnosis not present

## 2013-06-05 DIAGNOSIS — Z0000 Encounter for general adult medical examination without abnormal findings: Secondary | ICD-10-CM | POA: Diagnosis not present

## 2013-07-25 DIAGNOSIS — Z0000 Encounter for general adult medical examination without abnormal findings: Secondary | ICD-10-CM | POA: Diagnosis not present

## 2013-07-25 DIAGNOSIS — Z01419 Encounter for gynecological examination (general) (routine) without abnormal findings: Secondary | ICD-10-CM | POA: Diagnosis not present

## 2013-09-13 DIAGNOSIS — E7800 Pure hypercholesterolemia, unspecified: Secondary | ICD-10-CM | POA: Diagnosis not present

## 2013-09-13 DIAGNOSIS — Z79899 Other long term (current) drug therapy: Secondary | ICD-10-CM | POA: Diagnosis not present

## 2013-09-19 DIAGNOSIS — I872 Venous insufficiency (chronic) (peripheral): Secondary | ICD-10-CM | POA: Diagnosis not present

## 2013-09-19 DIAGNOSIS — L821 Other seborrheic keratosis: Secondary | ICD-10-CM | POA: Diagnosis not present

## 2013-10-16 DIAGNOSIS — H353190 Nonexudative age-related macular degeneration, unspecified eye, stage unspecified: Secondary | ICD-10-CM | POA: Diagnosis not present

## 2013-10-16 DIAGNOSIS — H524 Presbyopia: Secondary | ICD-10-CM | POA: Diagnosis not present

## 2013-10-16 DIAGNOSIS — H5200 Hypermetropia, unspecified eye: Secondary | ICD-10-CM | POA: Diagnosis not present

## 2013-10-16 DIAGNOSIS — H52229 Regular astigmatism, unspecified eye: Secondary | ICD-10-CM | POA: Diagnosis not present

## 2013-10-31 DIAGNOSIS — D485 Neoplasm of uncertain behavior of skin: Secondary | ICD-10-CM | POA: Diagnosis not present

## 2013-12-07 DIAGNOSIS — E785 Hyperlipidemia, unspecified: Secondary | ICD-10-CM | POA: Diagnosis not present

## 2013-12-07 DIAGNOSIS — E7800 Pure hypercholesterolemia, unspecified: Secondary | ICD-10-CM | POA: Diagnosis not present

## 2013-12-07 DIAGNOSIS — G4700 Insomnia, unspecified: Secondary | ICD-10-CM | POA: Diagnosis not present

## 2013-12-07 DIAGNOSIS — E559 Vitamin D deficiency, unspecified: Secondary | ICD-10-CM | POA: Diagnosis not present

## 2013-12-07 DIAGNOSIS — I10 Essential (primary) hypertension: Secondary | ICD-10-CM | POA: Diagnosis not present

## 2013-12-07 DIAGNOSIS — S81009A Unspecified open wound, unspecified knee, initial encounter: Secondary | ICD-10-CM | POA: Diagnosis not present

## 2013-12-14 DIAGNOSIS — H5200 Hypermetropia, unspecified eye: Secondary | ICD-10-CM | POA: Diagnosis not present

## 2013-12-14 DIAGNOSIS — H2589 Other age-related cataract: Secondary | ICD-10-CM | POA: Diagnosis not present

## 2013-12-26 ENCOUNTER — Encounter (HOSPITAL_COMMUNITY): Payer: Self-pay | Admitting: Pharmacy Technician

## 2013-12-26 DIAGNOSIS — H2589 Other age-related cataract: Secondary | ICD-10-CM | POA: Diagnosis not present

## 2013-12-26 NOTE — Patient Instructions (Signed)
Your procedure is scheduled on: 01/04/2014  Report to [facility] at  740  AM.  Call this number if you have problems the morning of surgery: [phone]   Do not eat food or drink liquids :After Midnight.      Take these medicines the morning of surgery with A SIP OF WATER: amlodipine   Do not wear jewelry, make-up or nail polish.  Do not wear lotions, powders, or perfumes.   Do not shave 48 hours prior to surgery.  Do not bring valuables to the hospital.  Contacts, dentures or bridgework may not be worn into surgery.  Leave suitcase in the car. After surgery it may be brought to your room.  For patients admitted to the hospital, checkout time is 11:00 AM the day of discharge.   Patients discharged the day of surgery will not be allowed to drive home.  :     Please read over the following fact sheets that you were given: Coughing and Deep Breathing, Surgical Site Infection Prevention, Anesthesia Post-op Instructions and Care and Recovery After Surgery    Cataract A cataract is a clouding of the lens of the eye. When a lens becomes cloudy, vision is reduced based on the degree and nature of the clouding. Many cataracts reduce vision to some degree. Some cataracts make people more near-sighted as they develop. Other cataracts increase glare. Cataracts that are ignored and become worse can sometimes look white. The white color can be seen through the pupil. CAUSES   Aging. However, cataracts may occur at any age, even in newborns.   Certain drugs.   Trauma to the eye.   Certain diseases such as diabetes.   Specific eye diseases such as chronic inflammation inside the eye or a sudden attack of a rare form of glaucoma.   Inherited or acquired medical problems.  SYMPTOMS   Gradual, progressive drop in vision in the affected eye.   Severe, rapid visual loss. This most often happens when trauma is the cause.  DIAGNOSIS  To detect a cataract, an eye doctor examines the lens. Cataracts  are best diagnosed with an exam of the eyes with the pupils enlarged (dilated) by drops.  TREATMENT  For an early cataract, vision may improve by using different eyeglasses or stronger lighting. If that does not help your vision, surgery is the only effective treatment. A cataract needs to be surgically removed when vision loss interferes with your everyday activities, such as driving, reading, or watching TV. A cataract may also have to be removed if it prevents examination or treatment of another eye problem. Surgery removes the cloudy lens and usually replaces it with a substitute lens (intraocular lens, IOL).  At a time when both you and your doctor agree, the cataract will be surgically removed. If you have cataracts in both eyes, only one is usually removed at a time. This allows the operated eye to heal and be out of danger from any possible problems after surgery (such as infection or poor wound healing). In rare cases, a cataract may be doing damage to your eye. In these cases, your caregiver may advise surgical removal right away. The vast majority of people who have cataract surgery have better vision afterward. HOME CARE INSTRUCTIONS  If you are not planning surgery, you may be asked to do the following:  Use different eyeglasses.   Use stronger or brighter lighting.   Ask your eye doctor about reducing your medicine dose or changing medicines if  it is thought that a medicine caused your cataract. Changing medicines does not make the cataract go away on its own.   Become familiar with your surroundings. Poor vision can lead to injury. Avoid bumping into things on the affected side. You are at a higher risk for tripping or falling.   Exercise extreme care when driving or operating machinery.   Wear sunglasses if you are sensitive to bright light or experiencing problems with glare.  SEEK IMMEDIATE MEDICAL CARE IF:   You have a worsening or sudden vision loss.   You notice redness,  swelling, or increasing pain in the eye.   You have a fever.  Document Released: 06/22/2005 Document Revised: 06/11/2011 Document Reviewed: 02/13/2011 [facility] Patient Information 2012 [place].PATIENT INSTRUCTIONS POST-ANESTHESIA  IMMEDIATELY FOLLOWING SURGERY:  Do not drive or operate machinery for the first twenty four hours after surgery.  Do not make any important decisions for twenty four hours after surgery or while taking narcotic pain medications or sedatives.  If you develop intractable nausea and vomiting or a severe headache please notify your doctor immediately.  FOLLOW-UP:  Please make an appointment with your surgeon as instructed. You do not need to follow up with anesthesia unless specifically instructed to do so.  WOUND CARE INSTRUCTIONS (if applicable):  Keep a dry clean dressing on the anesthesia/puncture wound site if there is drainage.  Once the wound has quit draining you may leave it open to air.  Generally you should leave the bandage intact for twenty four hours unless there is drainage.  If the epidural site drains for more than 36-48 hours please call the anesthesia department.  QUESTIONS?:  Please feel free to call your physician or the hospital operator if you have any questions, and they will be happy to assist you.

## 2013-12-27 ENCOUNTER — Encounter (HOSPITAL_COMMUNITY): Payer: Self-pay

## 2013-12-27 ENCOUNTER — Encounter (HOSPITAL_COMMUNITY)
Admission: RE | Admit: 2013-12-27 | Discharge: 2013-12-27 | Disposition: A | Discharge status: Home / Self Care | Payer: Medicare Other | Source: Ambulatory Visit | Attending: Ophthalmology | Admitting: Ophthalmology

## 2013-12-27 DIAGNOSIS — Z01812 Encounter for preprocedural laboratory examination: Secondary | ICD-10-CM | POA: Diagnosis not present

## 2013-12-27 DIAGNOSIS — Z01810 Encounter for preprocedural cardiovascular examination: Secondary | ICD-10-CM | POA: Insufficient documentation

## 2013-12-27 HISTORY — DX: Nonscarring hair loss, unspecified: L65.9

## 2013-12-27 LAB — BASIC METABOLIC PANEL
BUN: 15 mg/dL (ref 6–23)
CO2: 26 mEq/L (ref 19–32)
Calcium: 9.5 mg/dL (ref 8.4–10.5)
Chloride: 104 mEq/L (ref 96–112)
Creatinine, Ser: 0.74 mg/dL (ref 0.50–1.10)
GFR calc Af Amer: >90 mL/min (ref >90)
GFR calc non Af Amer: 85 mL/min — ABNORMAL LOW (ref >90)
Glucose, Bld: 132 mg/dL — ABNORMAL HIGH (ref 70–99)
Potassium: 4.3 mEq/L (ref 3.7–5.3)
SODIUM: 142 meq/L (ref 137–147)

## 2013-12-27 LAB — HEMOGLOBIN AND HEMATOCRIT, BLOOD
HEMATOCRIT: 39.5 % (ref 36.0–46.0)
Hemoglobin: 13.4 g/dL (ref 12.0–15.0)

## 2013-12-27 NOTE — Pre-Procedure Instructions (Signed)
Patient given information to sign up for my chart at home. 

## 2014-01-03 MED ORDER — LIDOCAINE HCL (PF) 1 % IJ SOLN
INTRAMUSCULAR | Status: AC
  Filled 2014-01-03: qty 2

## 2014-01-03 MED ORDER — TETRACAINE HCL 0.5 % OP SOLN
OPHTHALMIC | Status: AC
  Filled 2014-01-03: qty 2

## 2014-01-03 MED ORDER — NEOMYCIN-POLYMYXIN-DEXAMETH 3.5-10000-0.1 OP SUSP
OPHTHALMIC | Status: AC
  Filled 2014-01-03: qty 5

## 2014-01-03 MED ORDER — CYCLOPENTOLATE-PHENYLEPHRINE OP SOLN OPTIME - NO CHARGE
OPHTHALMIC | Status: AC
  Filled 2014-01-03: qty 2

## 2014-01-03 MED ORDER — PHENYLEPHRINE HCL 2.5 % OP SOLN
OPHTHALMIC | Status: AC
  Filled 2014-01-03: qty 15

## 2014-01-03 MED ORDER — LIDOCAINE HCL 3.5 % OP GEL
OPHTHALMIC | Status: AC
  Filled 2014-01-03: qty 1

## 2014-01-04 ENCOUNTER — Ambulatory Visit (HOSPITAL_COMMUNITY): Payer: Medicare Other | Admitting: Anesthesiology

## 2014-01-04 ENCOUNTER — Encounter (HOSPITAL_COMMUNITY)
Admission: RE | Disposition: A | Discharge status: Home / Self Care | Payer: Self-pay | Source: Ambulatory Visit | Attending: Ophthalmology

## 2014-01-04 ENCOUNTER — Encounter (HOSPITAL_COMMUNITY): Payer: Medicare Other | Admitting: Anesthesiology

## 2014-01-04 ENCOUNTER — Encounter (HOSPITAL_COMMUNITY): Payer: Self-pay | Admitting: *Deleted

## 2014-01-04 ENCOUNTER — Ambulatory Visit (HOSPITAL_COMMUNITY)
Admission: RE | Admit: 2014-01-04 | Discharge: 2014-01-04 | Disposition: A | Discharge status: Home / Self Care | Payer: Medicare Other | Source: Ambulatory Visit | Attending: Ophthalmology | Admitting: Ophthalmology

## 2014-01-04 DIAGNOSIS — Z79899 Other long term (current) drug therapy: Secondary | ICD-10-CM | POA: Diagnosis not present

## 2014-01-04 DIAGNOSIS — I10 Essential (primary) hypertension: Secondary | ICD-10-CM | POA: Insufficient documentation

## 2014-01-04 DIAGNOSIS — Z7982 Long term (current) use of aspirin: Secondary | ICD-10-CM | POA: Diagnosis not present

## 2014-01-04 DIAGNOSIS — H2589 Other age-related cataract: Secondary | ICD-10-CM | POA: Insufficient documentation

## 2014-01-04 DIAGNOSIS — H269 Unspecified cataract: Secondary | ICD-10-CM | POA: Diagnosis not present

## 2014-01-04 HISTORY — PX: CATARACT EXTRACTION W/PHACO: SHX586

## 2014-01-04 SURGERY — PHACOEMULSIFICATION, CATARACT, WITH IOL INSERTION
Anesthesia: Monitor Anesthesia Care | Site: Eye | Laterality: Left

## 2014-01-04 MED ORDER — EPINEPHRINE HCL 1 MG/ML IJ SOLN
INTRAMUSCULAR | Status: AC
  Filled 2014-01-04: qty 1

## 2014-01-04 MED ORDER — LACTATED RINGERS IV SOLN
INTRAVENOUS | Status: DC
  Administered 2014-01-04: 1000 mL via INTRAVENOUS

## 2014-01-04 MED ORDER — NEOMYCIN-POLYMYXIN-DEXAMETH 3.5-10000-0.1 OP SUSP
OPHTHALMIC | Status: DC | PRN
  Administered 2014-01-04: 2 [drp] via OPHTHALMIC

## 2014-01-04 MED ORDER — PROVISC 10 MG/ML IO SOLN
INTRAOCULAR | Status: DC | PRN
  Administered 2014-01-04: 0.85 mL via INTRAOCULAR

## 2014-01-04 MED ORDER — EPINEPHRINE HCL 1 MG/ML IJ SOLN
INTRAOCULAR | Status: DC | PRN
  Administered 2014-01-04: 09:00:00

## 2014-01-04 MED ORDER — PHENYLEPHRINE HCL 2.5 % OP SOLN
1.0000 [drp] | OPHTHALMIC | Status: AC
  Administered 2014-01-04 (×3): 1 [drp] via OPHTHALMIC

## 2014-01-04 MED ORDER — MIDAZOLAM HCL 2 MG/2ML IJ SOLN
INTRAMUSCULAR | Status: AC
  Filled 2014-01-04: qty 2

## 2014-01-04 MED ORDER — FENTANYL CITRATE 0.05 MG/ML IJ SOLN
INTRAMUSCULAR | Status: AC
  Filled 2014-01-04: qty 2

## 2014-01-04 MED ORDER — POVIDONE-IODINE 5 % OP SOLN
OPHTHALMIC | Status: DC | PRN
  Administered 2014-01-04: 1 via OPHTHALMIC

## 2014-01-04 MED ORDER — MIDAZOLAM HCL 2 MG/2ML IJ SOLN
1.0000 mg | INTRAMUSCULAR | Status: DC | PRN
  Administered 2014-01-04: 2 mg via INTRAVENOUS

## 2014-01-04 MED ORDER — CYCLOPENTOLATE-PHENYLEPHRINE 0.2-1 % OP SOLN
1.0000 [drp] | OPHTHALMIC | Status: AC
  Administered 2014-01-04 (×3): 1 [drp] via OPHTHALMIC

## 2014-01-04 MED ORDER — LIDOCAINE HCL 3.5 % OP GEL
1.0000 "application " | Freq: Once | OPHTHALMIC | Status: AC
  Administered 2014-01-04: 1 via OPHTHALMIC

## 2014-01-04 MED ORDER — FENTANYL CITRATE 0.05 MG/ML IJ SOLN
25.0000 ug | INTRAMUSCULAR | Status: AC
  Administered 2014-01-04 (×2): 25 ug via INTRAVENOUS

## 2014-01-04 MED ORDER — MIDAZOLAM HCL 5 MG/5ML IJ SOLN
INTRAMUSCULAR | Status: DC | PRN
  Administered 2014-01-04: 2 mg via INTRAVENOUS

## 2014-01-04 MED ORDER — TETRACAINE HCL 0.5 % OP SOLN
1.0000 [drp] | OPHTHALMIC | Status: AC
  Administered 2014-01-04 (×3): 1 [drp] via OPHTHALMIC

## 2014-01-04 MED ORDER — LIDOCAINE HCL (PF) 1 % IJ SOLN
INTRAMUSCULAR | Status: DC | PRN
  Administered 2014-01-04: .7 mL

## 2014-01-04 MED ORDER — BSS IO SOLN
INTRAOCULAR | Status: DC | PRN
  Administered 2014-01-04: 15 mL

## 2014-01-04 SURGICAL SUPPLY — 33 items
CAPSULAR TENSION RING-AMO (OPHTHALMIC RELATED) IMPLANT
CLOTH BEACON ORANGE TIMEOUT ST (SAFETY) ×2 IMPLANT
EYE SHIELD UNIVERSAL CLEAR (GAUZE/BANDAGES/DRESSINGS) ×2 IMPLANT
GLOVE BIO SURGEON STRL SZ 6.5 (GLOVE) IMPLANT
GLOVE BIO SURGEONS STRL SZ 6.5 (GLOVE)
GLOVE BIOGEL PI IND STRL 6.5 (GLOVE) IMPLANT
GLOVE BIOGEL PI IND STRL 7.0 (GLOVE) IMPLANT
GLOVE BIOGEL PI IND STRL 7.5 (GLOVE) IMPLANT
GLOVE BIOGEL PI INDICATOR 6.5 (GLOVE) ×2
GLOVE BIOGEL PI INDICATOR 7.0 (GLOVE) ×2
GLOVE BIOGEL PI INDICATOR 7.5 (GLOVE)
GLOVE ECLIPSE 6.5 STRL STRAW (GLOVE) IMPLANT
GLOVE ECLIPSE 7.0 STRL STRAW (GLOVE) IMPLANT
GLOVE ECLIPSE 7.5 STRL STRAW (GLOVE) IMPLANT
GLOVE EXAM NITRILE LRG STRL (GLOVE) IMPLANT
GLOVE EXAM NITRILE MD LF STRL (GLOVE) IMPLANT
GLOVE SKINSENSE NS SZ6.5 (GLOVE)
GLOVE SKINSENSE NS SZ7.0 (GLOVE)
GLOVE SKINSENSE STRL SZ6.5 (GLOVE) IMPLANT
GLOVE SKINSENSE STRL SZ7.0 (GLOVE) IMPLANT
KIT VITRECTOMY (OPHTHALMIC RELATED) IMPLANT
PAD ARMBOARD 7.5X6 YLW CONV (MISCELLANEOUS) ×2 IMPLANT
PROC W NO LENS (INTRAOCULAR LENS)
PROC W SPEC LENS (INTRAOCULAR LENS)
PROCESS W NO LENS (INTRAOCULAR LENS) IMPLANT
PROCESS W SPEC LENS (INTRAOCULAR LENS) IMPLANT
RING MALYGIN (MISCELLANEOUS) IMPLANT
SIGHTPATH CAT PROC W REG LENS (Ophthalmic Related) ×3 IMPLANT
SYR TB 1ML LL NO SAFETY (SYRINGE) ×2 IMPLANT
TAPE SURG TRANSPORE 1 IN (GAUZE/BANDAGES/DRESSINGS) IMPLANT
TAPE SURGICAL TRANSPORE 1 IN (GAUZE/BANDAGES/DRESSINGS) ×2
VISCOELASTIC ADDITIONAL (OPHTHALMIC RELATED) IMPLANT
WATER STERILE IRR 250ML POUR (IV SOLUTION) ×2 IMPLANT

## 2014-01-04 NOTE — Anesthesia Postprocedure Evaluation (Signed)
  Anesthesia Post-op Note  Patient: Kelli Roy  Procedure(s) Performed: Procedure(s) with comments: CATARACT EXTRACTION PHACO AND INTRAOCULAR LENS PLACEMENT (IOC) (Left) - CDE:5.65  Patient Location: Short Stay  Anesthesia Type:MAC  Level of Consciousness: awake, alert  and oriented  Airway and Oxygen Therapy: Patient Spontanous Breathing and Patient connected to face mask oxygen  Post-op Pain: none  Post-op Assessment: Post-op Vital signs reviewed, Patient's Cardiovascular Status Stable, Respiratory Function Stable, Patent Airway and No signs of Nausea or vomiting  Post-op Vital Signs: Reviewed and stable  Last Vitals:  Filed Vitals:   01/04/14 0802  BP: 158/68  Pulse: 70  Temp: 36.6 C  Resp: 18    Complications: No apparent anesthesia complications

## 2014-01-04 NOTE — Anesthesia Preprocedure Evaluation (Signed)
Anesthesia Evaluation  Patient identified by MRN, date of birth, ID band Patient awake    Reviewed: Allergy & Precautions, H&P , NPO status , Patient's Chart, lab work & pertinent test results  Airway Mallampati: II TM Distance: >3 FB     Dental   Pulmonary shortness of breath,  breath sounds clear to auscultation        Cardiovascular hypertension, Pt. on medications Rhythm:Regular Rate:Normal     Neuro/Psych  Headaches,    GI/Hepatic negative GI ROS,   Endo/Other    Renal/GU      Musculoskeletal   Abdominal   Peds  Hematology   Anesthesia Other Findings   Reproductive/Obstetrics                           Anesthesia Physical Anesthesia Plan  ASA: II  Anesthesia Plan: MAC   Post-op Pain Management:    Induction: Intravenous  Airway Management Planned: Nasal Cannula  Additional Equipment:   Intra-op Plan:   Post-operative Plan:   Informed Consent: I have reviewed the patients History and Physical, chart, labs and discussed the procedure including the risks, benefits and alternatives for the proposed anesthesia with the patient or authorized representative who has indicated his/her understanding and acceptance.     Plan Discussed with:   Anesthesia Plan Comments:         Anesthesia Quick Evaluation

## 2014-01-04 NOTE — Discharge Instructions (Signed)

## 2014-01-04 NOTE — H&P (Signed)
I have reviewed the H&P, the patient was re-examined, and I have identified no interval changes in medical condition and plan of care since the history and physical of record  

## 2014-01-04 NOTE — Transfer of Care (Signed)
Immediate Anesthesia Transfer of Care Note  Patient: Kelli Roy  Procedure(s) Performed: Procedure(s) with comments: CATARACT EXTRACTION PHACO AND INTRAOCULAR LENS PLACEMENT (IOC) (Left) - CDE:5.65  Patient Location: Short Stay  Anesthesia Type:MAC  Level of Consciousness: awake  Airway & Oxygen Therapy: Patient Spontanous Breathing  Post-op Assessment: Report given to PACU RN  Post vital signs: Reviewed  Complications: No apparent anesthesia complications

## 2014-01-04 NOTE — Op Note (Addendum)
Date of Admission: 01/04/2014  Date of Surgery: 01/04/2014   Pre-Op Dx: Cataract Left Eye  Post-Op Dx: Senile Combined Cataract Left  Eye,  Dx Code 366.19  Surgeon: Tonny Branch, M.D.  Assistants: None  Anesthesia: Topical with MAC  Indications: Painless, progressive loss of vision with compromise of daily activities.  Surgery: Cataract Extraction with Intraocular lens Implant Left Eye  Discription: The patient had dilating drops and viscous lidocaine placed into the Left eye in the pre-op holding area. After transfer to the operating room, a time out was performed. The patient was then prepped and draped. Beginning with a 54 degree blade a paracentesis port was made at the surgeon's 2 o'clock position. The anterior chamber was then filled with 1% non-preserved lidocaine. This was followed by filling the anterior chamber with Provisc.  A 2.11mm keratome blade was used to make a clear corneal incision at the temporal limbus.  A bent cystatome needle was used to create a continuous tear capsulotomy. Hydrodissection was performed with balanced salt solution on a Fine canula. The lens nucleus was then removed using the phacoemulsification handpiece. Residual cortex was removed with the I&A handpiece. The anterior chamber and capsular bag were refilled with Provisc. A posterior chamber intraocular lens was placed into the capsular bag with it's injector. The implant was positioned with the Kuglan hook. The Provisc was then removed from the anterior chamber and capsular bag with the I&A handpiece. Stromal hydration of the main incision and paracentesis port was performed with BSS on a Fine canula. The wounds were tested for leak which was negative. The patient tolerated the procedure well. There were no operative complications. The patient was then transferred to the recovery room in stable condition.  Complications: None  Specimen: None  EBL: None  Prosthetic device: Hoya iSert 250, power 26.0 D, SN  [id number].

## 2014-01-08 ENCOUNTER — Encounter (HOSPITAL_COMMUNITY): Payer: Self-pay | Admitting: Ophthalmology

## 2014-01-22 ENCOUNTER — Encounter (HOSPITAL_COMMUNITY): Payer: Self-pay | Admitting: Pharmacy Technician

## 2014-01-22 DIAGNOSIS — H2589 Other age-related cataract: Secondary | ICD-10-CM | POA: Diagnosis not present

## 2014-01-22 DIAGNOSIS — H5231 Anisometropia: Secondary | ICD-10-CM | POA: Diagnosis not present

## 2014-01-22 DIAGNOSIS — H18509 Unspecified hereditary corneal dystrophies, unspecified eye: Secondary | ICD-10-CM | POA: Diagnosis not present

## 2014-01-23 MED ORDER — ONDANSETRON HCL 4 MG/2ML IJ SOLN: 4.0000 mg | Freq: Once | INTRAMUSCULAR | Status: AC | PRN

## 2014-01-23 MED ORDER — FENTANYL CITRATE 0.05 MG/ML IJ SOLN: 25.0000 ug | INTRAMUSCULAR | Status: DC | PRN

## 2014-01-24 ENCOUNTER — Encounter (HOSPITAL_COMMUNITY)
Admission: RE | Admit: 2014-01-24 | Discharge: 2014-01-24 | Disposition: A | Discharge status: Home / Self Care | Payer: Medicare Other | Source: Ambulatory Visit | Attending: Ophthalmology | Admitting: Ophthalmology

## 2014-01-26 MED ORDER — TETRACAINE HCL 0.5 % OP SOLN
OPHTHALMIC | Status: AC
  Filled 2014-01-26: qty 2

## 2014-01-26 MED ORDER — PHENYLEPHRINE HCL 2.5 % OP SOLN
OPHTHALMIC | Status: AC
  Filled 2014-01-26: qty 15

## 2014-01-26 MED ORDER — LIDOCAINE HCL (PF) 1 % IJ SOLN
INTRAMUSCULAR | Status: AC
  Filled 2014-01-26: qty 2

## 2014-01-26 MED ORDER — LIDOCAINE HCL 3.5 % OP GEL
OPHTHALMIC | Status: AC
  Filled 2014-01-26: qty 1

## 2014-01-26 MED ORDER — NEOMYCIN-POLYMYXIN-DEXAMETH 3.5-10000-0.1 OP SUSP
OPHTHALMIC | Status: AC
  Filled 2014-01-26: qty 5

## 2014-01-26 MED ORDER — CYCLOPENTOLATE-PHENYLEPHRINE OP SOLN OPTIME - NO CHARGE
OPHTHALMIC | Status: AC
  Filled 2014-01-26: qty 2

## 2014-01-29 ENCOUNTER — Encounter (HOSPITAL_COMMUNITY): Payer: Medicare Other | Admitting: Anesthesiology

## 2014-01-29 ENCOUNTER — Encounter (HOSPITAL_COMMUNITY)
Admission: RE | Disposition: A | Discharge status: Home / Self Care | Payer: Self-pay | Source: Ambulatory Visit | Attending: Ophthalmology

## 2014-01-29 ENCOUNTER — Ambulatory Visit (HOSPITAL_COMMUNITY)
Admission: RE | Admit: 2014-01-29 | Discharge: 2014-01-29 | Disposition: A | Discharge status: Home / Self Care | Payer: Medicare Other | Source: Ambulatory Visit | Attending: Ophthalmology | Admitting: Ophthalmology

## 2014-01-29 ENCOUNTER — Encounter (HOSPITAL_COMMUNITY): Payer: Self-pay

## 2014-01-29 ENCOUNTER — Ambulatory Visit (HOSPITAL_COMMUNITY): Payer: Medicare Other | Admitting: Anesthesiology

## 2014-01-29 DIAGNOSIS — I10 Essential (primary) hypertension: Secondary | ICD-10-CM | POA: Insufficient documentation

## 2014-01-29 DIAGNOSIS — H2589 Other age-related cataract: Secondary | ICD-10-CM | POA: Insufficient documentation

## 2014-01-29 DIAGNOSIS — H259 Unspecified age-related cataract: Secondary | ICD-10-CM | POA: Diagnosis not present

## 2014-01-29 HISTORY — PX: CATARACT EXTRACTION W/PHACO: SHX586

## 2014-01-29 LAB — GLUCOSE, CAPILLARY: GLUCOSE-CAPILLARY: 103 mg/dL — AB (ref 70–99)

## 2014-01-29 SURGERY — PHACOEMULSIFICATION, CATARACT, WITH IOL INSERTION
Anesthesia: Monitor Anesthesia Care | Site: Eye | Laterality: Right

## 2014-01-29 MED ORDER — FENTANYL CITRATE 0.05 MG/ML IJ SOLN
INTRAMUSCULAR | Status: AC
  Filled 2014-01-29: qty 2

## 2014-01-29 MED ORDER — BSS IO SOLN
INTRAOCULAR | Status: DC | PRN
Start: 2014-01-29 — End: 2014-01-29
  Administered 2014-01-29: 15 mL

## 2014-01-29 MED ORDER — MIDAZOLAM HCL 2 MG/2ML IJ SOLN
INTRAMUSCULAR | Status: AC
  Filled 2014-01-29: qty 2

## 2014-01-29 MED ORDER — NEOMYCIN-POLYMYXIN-DEXAMETH 3.5-10000-0.1 OP SUSP
OPHTHALMIC | Status: DC | PRN
Start: 2014-01-29 — End: 2014-01-29
  Administered 2014-01-29: 2 [drp] via OPHTHALMIC

## 2014-01-29 MED ORDER — LIDOCAINE 3.5 % OP GEL OPTIME - NO CHARGE
OPHTHALMIC | Status: DC | PRN
Start: 2014-01-29 — End: 2014-01-29
  Administered 2014-01-29: 1 [drp] via OPHTHALMIC

## 2014-01-29 MED ORDER — MIDAZOLAM HCL 5 MG/5ML IJ SOLN
INTRAMUSCULAR | Status: DC | PRN
  Administered 2014-01-29 (×2): 1 mg via INTRAVENOUS

## 2014-01-29 MED ORDER — LIDOCAINE HCL (PF) 1 % IJ SOLN
INTRAMUSCULAR | Status: DC | PRN
  Administered 2014-01-29: .5 mL

## 2014-01-29 MED ORDER — LIDOCAINE HCL 3.5 % OP GEL
1.0000 "application " | Freq: Once | OPHTHALMIC | Status: AC
  Administered 2014-01-29: 1 via OPHTHALMIC

## 2014-01-29 MED ORDER — PHENYLEPHRINE HCL 2.5 % OP SOLN
1.0000 [drp] | OPHTHALMIC | Status: AC
  Administered 2014-01-29 (×3): 1 [drp] via OPHTHALMIC

## 2014-01-29 MED ORDER — MIDAZOLAM HCL 2 MG/2ML IJ SOLN
1.0000 mg | INTRAMUSCULAR | Status: DC | PRN
  Administered 2014-01-29: 2 mg via INTRAVENOUS

## 2014-01-29 MED ORDER — EPINEPHRINE HCL 1 MG/ML IJ SOLN
INTRAMUSCULAR | Status: AC
  Filled 2014-01-29: qty 1

## 2014-01-29 MED ORDER — NA CHONDROIT SULF-NA HYALURON 40-30 MG/ML IO SOLN
INTRAOCULAR | Status: DC | PRN
Start: 2014-01-29 — End: 2014-01-29
  Administered 2014-01-29: 0.5 mL via INTRAOCULAR

## 2014-01-29 MED ORDER — LACTATED RINGERS IV SOLN
INTRAVENOUS | Status: DC
  Administered 2014-01-29: 10:00:00 via INTRAVENOUS

## 2014-01-29 MED ORDER — CYCLOPENTOLATE-PHENYLEPHRINE 0.2-1 % OP SOLN
1.0000 [drp] | OPHTHALMIC | Status: AC
  Administered 2014-01-29 (×3): 1 [drp] via OPHTHALMIC

## 2014-01-29 MED ORDER — POVIDONE-IODINE 5 % OP SOLN
OPHTHALMIC | Status: DC | PRN
Start: 2014-01-29 — End: 2014-01-29
  Administered 2014-01-29: 1 via OPHTHALMIC

## 2014-01-29 MED ORDER — LACTATED RINGERS IV SOLN
INTRAVENOUS | Status: DC | PRN
  Administered 2014-01-29: 08:00:00 via INTRAVENOUS

## 2014-01-29 MED ORDER — EPINEPHRINE HCL 1 MG/ML IJ SOLN
INTRAMUSCULAR | Status: DC | PRN
  Administered 2014-01-29: 10:00:00

## 2014-01-29 MED ORDER — PROVISC 10 MG/ML IO SOLN
INTRAOCULAR | Status: DC | PRN
  Administered 2014-01-29: 0.85 mL via INTRAOCULAR

## 2014-01-29 MED ORDER — TETRACAINE HCL 0.5 % OP SOLN
1.0000 [drp] | OPHTHALMIC | Status: AC
  Administered 2014-01-29 (×3): 1 [drp] via OPHTHALMIC

## 2014-01-29 MED ORDER — TETRACAINE HCL 0.5 % OP SOLN
OPHTHALMIC | Status: AC
  Filled 2014-01-29: qty 2

## 2014-01-29 MED ORDER — FENTANYL CITRATE 0.05 MG/ML IJ SOLN
25.0000 ug | INTRAMUSCULAR | Status: AC
  Administered 2014-01-29: 25 ug via INTRAVENOUS

## 2014-01-29 SURGICAL SUPPLY — 12 items
CLOTH BEACON ORANGE TIMEOUT ST (SAFETY) ×2 IMPLANT
EYE SHIELD UNIVERSAL CLEAR (GAUZE/BANDAGES/DRESSINGS) ×2 IMPLANT
GLOVE BIOGEL PI IND STRL 7.0 (GLOVE) IMPLANT
GLOVE BIOGEL PI INDICATOR 7.0 (GLOVE) ×2
GLOVE EXAM NITRILE MD LF STRL (GLOVE) ×2 IMPLANT
GLOVE SURG SS PI 7.5 STRL IVOR (GLOVE) ×2 IMPLANT
PAD ARMBOARD 7.5X6 YLW CONV (MISCELLANEOUS) ×2 IMPLANT
SIGHTPATH CAT PROC W REG LENS (Ophthalmic Related) ×3 IMPLANT
SYRINGE LUER LOK 1CC (MISCELLANEOUS) ×2 IMPLANT
TAPE SURG TRANSPORE 1 IN (GAUZE/BANDAGES/DRESSINGS) IMPLANT
TAPE SURGICAL TRANSPORE 1 IN (GAUZE/BANDAGES/DRESSINGS) ×2
WATER STERILE IRR 250ML POUR (IV SOLUTION) ×2 IMPLANT

## 2014-01-29 NOTE — Transfer of Care (Signed)
Immediate Anesthesia Transfer of Care Note  Patient: Kelli Roy  Procedure(s) Performed: Procedure(s): CATARACT EXTRACTION PHACO AND INTRAOCULAR LENS PLACEMENT RIGHT EYE CDE=4.49 (Right)  Patient Location: Short Stay  Anesthesia Type:MAC  Level of Consciousness: awake, alert , oriented and patient cooperative  Airway & Oxygen Therapy: Patient Spontanous Breathing  Post-op Assessment: Report given to PACU RN and Post -op Vital signs reviewed and stable  Post vital signs: Reviewed and stable  Complications: No apparent anesthesia complications

## 2014-01-29 NOTE — Anesthesia Postprocedure Evaluation (Signed)
  Anesthesia Post-op Note  Patient: Kelli Roy  Procedure(s) Performed: Procedure(s): CATARACT EXTRACTION PHACO AND INTRAOCULAR LENS PLACEMENT RIGHT EYE CDE=4.49 (Right)  Patient Location: Short Stay  Anesthesia Type:MAC  Level of Consciousness: awake, alert , oriented and patient cooperative  Airway and Oxygen Therapy: Patient Spontanous Breathing  Post-op Pain: none  Post-op Assessment: Post-op Vital signs reviewed, Patient's Cardiovascular Status Stable, Respiratory Function Stable, Patent Airway, No signs of Nausea or vomiting and Pain level controlled  Post-op Vital Signs: Reviewed and stable  Last Vitals:  Filed Vitals:   01/29/14 0940  BP: 105/55  Pulse:   Temp:   Resp: 49    Complications: No apparent anesthesia complications

## 2014-01-29 NOTE — Op Note (Signed)
Date of Admission: 01/29/2014  Date of Surgery: 01/29/2014   Pre-Op Dx: Cataract Right Eye  Post-Op Dx: Senile Combined  Cataract Right  Eye,  Dx Code 366.19  Surgeon: Tonny Branch, M.D.  Assistants: None  Anesthesia: Topical with MAC  Indications: Painless, progressive loss of vision with compromise of daily activities.  Surgery: Cataract Extraction with Intraocular lens Implant Right Eye  Discription: The patient had dilating drops and viscous lidocaine placed into the Right eye in the pre-op holding area. After transfer to the operating room, a time out was performed. The patient was then prepped and draped. Beginning with a 60 degree blade a paracentesis port was made at the surgeon's 2 o'clock position. The anterior chamber was then filled with 1% non-preserved lidocaine. This was followed by filling the anterior chamber with Viscoat.  A 2.47mm keratome blade was used to make a clear corneal incision at the temporal limbus.  A bent cystatome needle was used to create a continuous tear capsulotomy. Hydrodissection was performed with balanced salt solution on a Fine canula. The lens nucleus was then removed using the phacoemulsification handpiece. Residual cortex was removed with the I&A handpiece. The anterior chamber and capsular bag were refilled with Provisc. A posterior chamber intraocular lens was placed into the capsular bag with it's injector. The implant was positioned with the Kuglan hook. The Provisc was then removed from the anterior chamber and capsular bag with the I&A handpiece. Stromal hydration of the main incision and paracentesis port was performed with BSS on a Fine canula. The wounds were tested for leak which was negative. The patient tolerated the procedure well. There were no operative complications. The patient was then transferred to the recovery room in stable condition.  Complications: None  Specimen: None  EBL: None  Prosthetic device: Hoya iSert 250, power 23.0  D, SN [id number].

## 2014-01-29 NOTE — Anesthesia Preprocedure Evaluation (Signed)
Anesthesia Evaluation  Patient identified by MRN, date of birth, ID band Patient awake    Reviewed: Allergy & Precautions, H&P , NPO status , Patient's Chart, lab work & pertinent test results  Airway Mallampati: II TM Distance: >3 FB     Dental   Pulmonary shortness of breath,  breath sounds clear to auscultation        Cardiovascular hypertension, Pt. on medications Rhythm:Regular Rate:Normal     Neuro/Psych  Headaches,    GI/Hepatic negative GI ROS,   Endo/Other    Renal/GU      Musculoskeletal   Abdominal   Peds  Hematology   Anesthesia Other Findings   Reproductive/Obstetrics                           Anesthesia Physical Anesthesia Plan  ASA: II  Anesthesia Plan: MAC   Post-op Pain Management:    Induction: Intravenous  Airway Management Planned: Nasal Cannula  Additional Equipment:   Intra-op Plan:   Post-operative Plan:   Informed Consent: I have reviewed the patients History and Physical, chart, labs and discussed the procedure including the risks, benefits and alternatives for the proposed anesthesia with the patient or authorized representative who has indicated his/her understanding and acceptance.     Plan Discussed with:   Anesthesia Plan Comments:         Anesthesia Quick Evaluation

## 2014-01-29 NOTE — Anesthesia Procedure Notes (Signed)
Procedure Name: MAC Date/Time: 01/29/2014 9:41 AM Performed by: Andree Elk, Zubayr Bednarczyk A Pre-anesthesia Checklist: Patient identified, Timeout performed, Emergency Drugs available, Suction available and Patient being monitored Patient Re-evaluated:Patient Re-evaluated prior to inductionOxygen Delivery Method: Nasal cannula

## 2014-01-29 NOTE — Discharge Instructions (Signed)
Cataract Surgery Care After Refer to this sheet in the next few weeks. These instructions provide you with information on caring for yourself after your procedure. Your caregiver may also give you more specific instructions. Your treatment has been planned according to current medical practices, but problems sometimes occur. Call your caregiver if you have any problems or questions after your procedure.  HOME CARE INSTRUCTIONS   Avoid strenuous activities as directed by your caregiver.  Ask your caregiver when you can resume driving.  Use eyedrops or other medicines to help healing and control pressure inside your eye as directed by your caregiver.  Only take over-the-counter or prescription medicines for pain, discomfort, or fever as directed by your caregiver.  Do not to touch or rub your eyes.  You may be instructed to use a protective shield during the first few days and nights after surgery. If not, wear sunglasses to protect your eyes. This is to protect the eye from pressure or from being accidentally bumped.  Keep the area around your eye clean and dry. Avoid swimming or allowing water to hit you directly in the face while showering. Keep soap and shampoo out of your eyes.  Do not bend or lift heavy objects. Bending increases pressure in the eye. You can walk, climb stairs, and do light household chores.  Do not put a contact lens into the eye that had surgery until your caregiver says it is okay to do so.  Ask your doctor when you can return to work. This will depend on the kind of work that you do. If you work in a dusty environment, you may be advised to wear protective eyewear for a period of time.  Ask your caregiver when it will be safe to engage in sexual activity.  Continue with your regular eye exams as directed by your caregiver. What to expect:  It is normal to feel itching and mild discomfort for a few days after cataract surgery. Some fluid discharge is also common,  and your eye may be sensitive to light and touch.  After 1 to 2 days, even moderate discomfort should disappear. In most cases, healing will take about 6 weeks.  If you received an intraocular lens (IOL), you may notice that colors are very bright or have a blue tinge. Also, if you have been in bright sunlight, everything may appear reddish for a few hours. If you see these color tinges, it is because your lens is clear and no longer cloudy. Within a few months after receiving an IOL, these extra colors should go away. When you have healed, you will probably need new glasses. SEEK MEDICAL CARE IF:   You have increased bruising around your eye.  You have discomfort not helped by medicine. SEEK IMMEDIATE MEDICAL CARE IF:   You have a fever.  You have a worsening or sudden vision loss.  You have redness, swelling, or increasing pain in the eye.  You have a thick discharge from the eye that had surgery. MAKE SURE YOU:  Understand these instructions.  Will watch your condition.  Will get help right away if you are not doing well or get worse. Document Released: 01/09/2005 Document Revised: 09/14/2011 Document Reviewed: 02/13/2011 [facility] Patient Information 2015 [place], [place]. This information is not intended to replace advice given to you by your health care provider. Make sure you discuss any questions you have with your health care provider.    PATIENT INSTRUCTIONS POST-ANESTHESIA  IMMEDIATELY FOLLOWING SURGERY:  Do not  drive or operate machinery for the first twenty four hours after surgery.  Do not make any important decisions for twenty four hours after surgery or while taking narcotic pain medications or sedatives.  If you develop intractable nausea and vomiting or a severe headache please notify your doctor immediately.  FOLLOW-UP:  Please make an appointment with your surgeon as instructed. You do not need to follow up with anesthesia unless specifically instructed to do  so.  WOUND CARE INSTRUCTIONS (if applicable):  Keep a dry clean dressing on the anesthesia/puncture wound site if there is drainage.  Once the wound has quit draining you may leave it open to air.  Generally you should leave the bandage intact for twenty four hours unless there is drainage.  If the epidural site drains for more than 36-48 hours please call the anesthesia department.  QUESTIONS?:  Please feel free to call your physician or the hospital operator if you have any questions, and they will be happy to assist you.

## 2014-01-29 NOTE — H&P (Signed)
I have reviewed the H&P, the patient was re-examined, and I have identified no interval changes in medical condition and plan of care since the history and physical of record  

## 2014-01-30 ENCOUNTER — Encounter (HOSPITAL_COMMUNITY): Payer: Self-pay | Admitting: Ophthalmology

## 2014-03-06 DIAGNOSIS — I219 Acute myocardial infarction, unspecified: Secondary | ICD-10-CM

## 2014-03-06 HISTORY — DX: Acute myocardial infarction, unspecified: I21.9

## 2014-03-07 ENCOUNTER — Emergency Department (HOSPITAL_COMMUNITY)
Admission: EM | Admit: 2014-03-07 | Discharge: 2014-03-07 | Disposition: A | Discharge status: Home / Self Care | Payer: Medicare Other | Source: Home / Self Care | Attending: Emergency Medicine | Admitting: Emergency Medicine

## 2014-03-07 ENCOUNTER — Emergency Department (HOSPITAL_COMMUNITY): Payer: Medicare Other

## 2014-03-07 ENCOUNTER — Encounter (HOSPITAL_COMMUNITY): Payer: Self-pay | Admitting: Emergency Medicine

## 2014-03-07 DIAGNOSIS — Z79899 Other long term (current) drug therapy: Secondary | ICD-10-CM

## 2014-03-07 DIAGNOSIS — Z862 Personal history of diseases of the blood and blood-forming organs and certain disorders involving the immune mechanism: Secondary | ICD-10-CM | POA: Insufficient documentation

## 2014-03-07 DIAGNOSIS — R51 Headache: Secondary | ICD-10-CM | POA: Insufficient documentation

## 2014-03-07 DIAGNOSIS — I219 Acute myocardial infarction, unspecified: Secondary | ICD-10-CM | POA: Diagnosis not present

## 2014-03-07 DIAGNOSIS — K270 Acute peptic ulcer, site unspecified, with hemorrhage: Secondary | ICD-10-CM | POA: Insufficient documentation

## 2014-03-07 DIAGNOSIS — Z8639 Personal history of other endocrine, nutritional and metabolic disease: Secondary | ICD-10-CM

## 2014-03-07 DIAGNOSIS — I10 Essential (primary) hypertension: Secondary | ICD-10-CM | POA: Diagnosis not present

## 2014-03-07 DIAGNOSIS — Z9889 Other specified postprocedural states: Secondary | ICD-10-CM

## 2014-03-07 DIAGNOSIS — Z7982 Long term (current) use of aspirin: Secondary | ICD-10-CM

## 2014-03-07 DIAGNOSIS — R220 Localized swelling, mass and lump, head: Secondary | ICD-10-CM

## 2014-03-07 DIAGNOSIS — R221 Localized swelling, mass and lump, neck: Secondary | ICD-10-CM

## 2014-03-07 DIAGNOSIS — R079 Chest pain, unspecified: Secondary | ICD-10-CM

## 2014-03-07 DIAGNOSIS — I2109 ST elevation (STEMI) myocardial infarction involving other coronary artery of anterior wall: Secondary | ICD-10-CM | POA: Diagnosis not present

## 2014-03-07 DIAGNOSIS — Z91040 Latex allergy status: Secondary | ICD-10-CM

## 2014-03-07 DIAGNOSIS — G4700 Insomnia, unspecified: Secondary | ICD-10-CM

## 2014-03-07 DIAGNOSIS — K279 Peptic ulcer, site unspecified, unspecified as acute or chronic, without hemorrhage or perforation: Secondary | ICD-10-CM

## 2014-03-07 DIAGNOSIS — Z872 Personal history of diseases of the skin and subcutaneous tissue: Secondary | ICD-10-CM

## 2014-03-07 LAB — PRO B NATRIURETIC PEPTIDE: PRO B NATRI PEPTIDE: 112.8 pg/mL (ref 0–125)

## 2014-03-07 LAB — CBC
HCT: 41.7 % (ref 36.0–46.0)
Hemoglobin: 14.1 g/dL (ref 12.0–15.0)
MCH: 31.4 pg (ref 26.0–34.0)
MCHC: 33.8 g/dL (ref 30.0–36.0)
MCV: 92.9 fL (ref 78.0–100.0)
Platelets: 217 10*3/uL (ref 150–400)
RBC: 4.49 MIL/uL (ref 3.87–5.11)
RDW: 12.8 % (ref 11.5–15.5)
WBC: 6.4 10*3/uL (ref 4.0–10.5)

## 2014-03-07 LAB — TROPONIN I

## 2014-03-07 LAB — BASIC METABOLIC PANEL
Anion gap: 11 (ref 5–15)
BUN: 16 mg/dL (ref 6–23)
CO2: 27 mEq/L (ref 19–32)
Calcium: 10.2 mg/dL (ref 8.4–10.5)
Chloride: 101 mEq/L (ref 96–112)
Creatinine, Ser: 0.76 mg/dL (ref 0.50–1.10)
GFR calc Af Amer: >90 mL/min (ref >90)
GFR calc non Af Amer: 84 mL/min — ABNORMAL LOW (ref >90)
Glucose, Bld: 99 mg/dL (ref 70–99)
Potassium: 4.4 mEq/L (ref 3.7–5.3)
Sodium: 139 mEq/L (ref 137–147)

## 2014-03-07 MED ORDER — RANITIDINE HCL 150 MG PO TABS: 150.0000 mg | ORAL_TABLET | Freq: Two times a day (BID) | ORAL | Status: DC

## 2014-03-07 MED ORDER — GI COCKTAIL ~~LOC~~
30.0000 mL | Freq: Once | ORAL | Status: AC
  Administered 2014-03-07: 30 mL via ORAL
  Filled 2014-03-07: qty 30

## 2014-03-07 NOTE — ED Notes (Signed)
The pt took 650 mg of aspirin enroute here tonight

## 2014-03-07 NOTE — Discharge Instructions (Signed)
Please follow the instructions provided.  Be sure to follow up with your primary care provider regarding management of this pain.  Please start the prescription of Zantac.  Be cautious with medicines containing ibuprofen, aspirin including BC powder.    SEEK IMMEDIATE MEDICAL ATTENTION IF: You have severe chest pain, especially if the pain is crushing or pressure-like and spreads to the arms, back, neck, or jaw, or if you have sweating, nausea (feeling sick to your stomach), or shortness of breath. THIS IS AN EMERGENCY. Don't wait to see if the pain will go away. Get medical help at once. Call 911 or [phone] (operator). DO NOT drive yourself to the hospital.  Your chest pain gets worse and does not go away with rest.  You have an attack of chest pain lasting longer than usual, despite rest and treatment with the medications your caregiver has prescribed.  You wake from sleep with chest pain or shortness of breath.  You feel dizzy or faint.  You have chest pain not typical of your usual pain for which you originally saw your caregiver.

## 2014-03-07 NOTE — ED Notes (Signed)
Pt sts she feels that gas in her chest is gone.  No pain reported.

## 2014-03-07 NOTE — ED Notes (Signed)
Pt sts she had one episode of diarrhea today.  Pt sts last night when she had her episode of chest pain that it radiated to her back.  No episodes today.

## 2014-03-07 NOTE — ED Notes (Signed)
The pt is c/o  Mid-chest pain with bi-lateral arm radiation since last pm.  With some sob that she reports she has all the time sl nausea.   No previous history

## 2014-03-07 NOTE — ED Provider Notes (Signed)
68 year old female no history of coronary disease but has history of hypertension and hyperlipidemia. She uses BC powder multiple times a week chronically, She presents because of intermittent chest pain described as epigastric, radiating to bilateral back, worse with lying down and better with sitting up. She took Rolaids with some improvement, no associated shortness of breath or swelling. On exam the patient is clear heart sounds, clear lung sounds, no tachycardia no reproducible tenderness. No peripheral edema. She was given a GI cocktail with complete resolution of the patient's symptoms, this is low risk chest pain, she'll be discharged to followup in the outpatient setting on Zantac. The patient has expressed her understanding to the indications for return followup with her family doctor.  Medical screening examination/treatment/procedure(s) were conducted as a shared visit with non-physician practitioner(s) and myself.  I personally evaluated the patient during the encounter.  Clinical Impression: Chest Pain, PUD        Johnna Acosta, MD 03/08/14 1440

## 2014-03-07 NOTE — ED Provider Notes (Signed)
CSN: [id number]     Arrival date & time 03/07/14  2031 History   First MD Initiated Contact with Patient 03/07/14 2057     Chief Complaint  Patient presents with  . Chest Pain   (Consider location/radiation/quality/duration/timing/severity/associated sxs/prior Treatment) HPI Kelli Roy is a 68 yo female with PMH: HTN, HLD, presenting to the ED with report of sudden onset chest pain.  Pt reports she had been sleeping on the couch last night and felt well.  She woke up around midnight, went to bed and upon lying flat, felt a sudden chest tightness, it radiated through to her back and down both arms. It slowly improved when she sat up and resolved enough for her to go to sleep.  Today, she felt well until about 3 pm and she laid down again and had a recurrence of the previous episode.  It lasted a few minutes, improved with sitting up, but has not fully resolved.  Pt currently ranks "chest tightness" as 3-4/10.  She endorses a mild cough for the last week, and reports she had 1 episode of diarrhea today but it has resolved.  She denies fever, chills, nausea, vomiting or abd pain.  Past Medical History  Diagnosis Date  . Hypertension   . Hyperlipidemia   . Sinus drainage   . Chest pain, unspecified   . Malaise and fatigue   . Insomnia   . Shortness of breath   . Family history of colon cancer   . Family history of cardiovascular disease   . History of long-term treatment with high-risk medication   . Sinus headache   . Alopecia    Past Surgical History  Procedure Laterality Date  . Cardiac catheterization  2001    showed no blockages  . Cystectomy  1998    fibroid cysts  . 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 Cervical spine surgery  09/27/2003    C6-67 servical fusion  . Dilation and curettage of uterus  1976    2nd to miscarriage  . Cataract extraction w/phaco Left 01/04/2014    Procedure: CATARACT EXTRACTION PHACO AND INTRAOCULAR LENS  PLACEMENT (IOC);  Surgeon: Tonny Branch, MD;  Location: [facility];  Service: Ophthalmology;  Laterality: Left;  CDE:5.65  . Cataract extraction w/phaco Right 01/29/2014    Procedure: CATARACT EXTRACTION PHACO AND INTRAOCULAR LENS PLACEMENT RIGHT EYE CDE=4.49;  Surgeon: Tonny Branch, MD;  Location: [facility];  Service: Ophthalmology;  Laterality: Right;   Family History  Problem Relation Age of Onset  . Stroke Mother   . Heart attack Mother   . Peripheral vascular disease Father     had stent in leg  . Colon cancer Father   . Atrial fibrillation Father     with pacemaker  . Thyroid disease Father   . Pancreatic cancer Brother   . Coronary artery disease Brother     with Stent  . Coronary artery disease Brother     with CABG in his 21's  . Thyroid disease Brother   . Thyroid disease Sister    History  Substance Use Topics  . Smoking status: Never Smoker   . Smokeless tobacco: Never Used  . Alcohol Use: Yes     Comment: rare glass of wine   OB History   Grav Para Term Preterm Abortions TAB SAB Ect Mult Living  Review of Systems  Constitutional: Negative for fever and chills.  HENT: Negative for sore throat.   Eyes: Negative for visual disturbance.  Respiratory: Positive for cough and chest tightness. Negative for shortness of breath.   Cardiovascular: Positive for chest pain. Negative for leg swelling.  Gastrointestinal: Negative for nausea, vomiting, abdominal pain and diarrhea.  Genitourinary: Negative for dysuria.  Musculoskeletal: Negative for myalgias.  Skin: Negative for rash.  Neurological: Negative for weakness, numbness and headaches.    Allergies  Demerol; Dexilant; Meperidine hcl; Sulfonamide derivatives; and Latex  Home Medications   Prior to Admission medications   Medication Sig Start Date End Date Taking? Authorizing Provider  amLODipine (NORVASC) 5 MG tablet Take 5 mg by mouth daily.    Historical Provider, MD  aspirin EC 81 MG tablet Take 81 mg  by mouth daily.    Historical Provider, MD  Calcium Carbonate-Vitamin D (CALTRATE 600+D) 600-400 MG-UNIT per tablet Take 1 tablet by mouth daily.    Historical Provider, MD  Cholecalciferol (VITAMIN D) 2000 UNITS tablet Take 2,000 Units by mouth daily.    Historical Provider, MD  eszopiclone (LUNESTA) 2 MG TABS Take 1 mg by mouth at bedtime as needed for sleep.     Historical Provider, MD  Glucosamine-Chondroit-Vit C-Mn (GLUCOSAMINE CHONDR 1500 COMPLX PO) Take 2 tablets by mouth daily.    Historical Provider, MD  irbesartan (AVAPRO) 300 MG tablet Take 300 mg by mouth daily.    Historical Provider, MD  Magnesium 400 MG CAPS Take 400 mg by mouth daily.    Historical Provider, MD  Multiple Vitamins-Minerals (ALIVE WOMENS ENERGY PO) Take 1 tablet by mouth daily.    Historical Provider, MD  Omega-3 Fatty Acids (FISH OIL) 1200 MG CAPS Take 2 capsules by mouth daily.    Historical Provider, MD  vitamin B-12 (CYANOCOBALAMIN) 1000 MCG tablet Take 1,000 mcg by mouth daily.    Historical Provider, MD   BP 144/73  Pulse 67  Temp(Src) 98.4 F (36.9 C) (Oral)  Resp 16  SpO2 98% Physical Exam  Nursing note and vitals reviewed. Constitutional: She is oriented to person, place, and time. She appears well-developed and well-nourished. No distress.  HENT:  Head: Normocephalic and atraumatic.  Mouth/Throat: Oropharynx is clear and moist. No oropharyngeal exudate.  Eyes: Conjunctivae are normal.  Neck: Neck supple. No thyromegaly present.  Cardiovascular: Normal rate, regular rhythm, S1 normal, S2 normal and intact distal pulses.  Exam reveals no gallop.   No murmur heard. Pulses:      Radial pulses are 2+ on the right side, and 2+ on the left side.       Dorsalis pedis pulses are 2+ on the right side, and 2+ on the left side.  Tender to palpation over sternum  Pulmonary/Chest: Effort normal and breath sounds normal. No respiratory distress. She has no wheezes. She has no rhonchi. She has no rales. She  exhibits no tenderness.  Abdominal: Soft. There is no tenderness.  Musculoskeletal: She exhibits no tenderness.  Lymphadenopathy:    She has no cervical adenopathy.  Neurological: She is alert and oriented to person, place, and time.  Skin: Skin is warm and dry. No rash noted. She is not diaphoretic.  Psychiatric: She has a normal mood and affect.   ED Course  Procedures (including critical care time) Labs Review Labs Reviewed  BASIC METABOLIC PANEL - Abnormal; Notable for the following:    GFR calc non Af Amer 84 (*)    All other components within  normal limits  CBC  PRO B NATRIURETIC PEPTIDE  TROPONIN I    Imaging Review Dg Chest 2 View  03/07/2014   CLINICAL DATA:  chest pain  EXAM: CHEST - 2 VIEW  COMPARISON:  07/08/2009 and earlier studies  FINDINGS: Lungs are clear. Heart size and mediastinal contours are within normal limits. No effusion. Cervical fixation hardware noted.  IMPRESSION: No acute cardiopulmonary disease.   Electronically Signed   By: Arne Cleveland M.D.   On: 03/07/2014 23:12     EKG Interpretation   Date/Time:  Wednesday March 07 2014 20:37:33 EDT Ventricular Rate:  66 PR Interval:  128 QRS Duration: 86 QT Interval:  396 QTC Calculation: 415 R Axis:   29 Text Interpretation:  Normal sinus rhythm Cannot rule out Anterior infarct  , age undetermined Abnormal ECG since last tracing no significant change  Confirmed by MILLER  MD, [place] ([id number]) on 03/07/2014 9:54:38 PM      MDM   Final diagnoses:  Peptic ulcer disease   MDM Number of Diagnoses or Management Options   CBC, BMP, BNP, and EKG ordered in triage.  Chest Xray and GI cocktail ordered. Heart score: 3 only due to age and 2 risk factors.  Based on presentation, chest pain is not likely of cardiac or pulmonary etiology, her vital signs are stable, no tracheal deviation, no JVD or new murmur, RRR, breath sounds equal bilaterally, EKG without acute abnormalities, negative troponin, and negative  CXR.  Pt's pain resolved after GI cocktail.  Patient appears safe to be discharged home.  Discharge instructions include follow up with PCP in regards to today's hospital visit and prescription for H2 blocker. Pt appears reliable for follow up and is agreeable to discharge.  Return precautions include return to the ED is CP becomes exertional, associated with diaphoresis or nausea, radiates to left jaw/arm, worsens or becomes concerning in any way.  Case has been discussed with and seen by Dr. Sabra Heck who agrees with the above plan to discharge.   Filed Vitals:   03/07/14 2130 03/07/14 2145 03/07/14 2215 03/07/14 2310  BP: 135/62 133/58 135/61 154/66  Pulse: 62 53 56 52  Temp:      TempSrc:      Resp: 16 13 17 14   SpO2: 98% 97% 95% 96%    Meds given in ED:  Medications  gi cocktail (Maalox,Lidocaine,Donnatal) (30 mLs Oral Given 03/07/14 2223)    Discharge Medication List as of 03/07/2014 11:14 PM    START taking these medications   Details  ranitidine (ZANTAC) 150 MG tablet Take 1 tablet (150 mg total) by mouth 2 (two) times daily., Starting 03/07/2014, Until Discontinued, Print              Britt Bottom, NP 03/08/14 0005

## 2014-03-08 ENCOUNTER — Emergency Department (HOSPITAL_COMMUNITY): Payer: Medicare Other

## 2014-03-08 ENCOUNTER — Inpatient Hospital Stay (HOSPITAL_COMMUNITY): Payer: Medicare Other

## 2014-03-08 ENCOUNTER — Encounter (HOSPITAL_COMMUNITY): Payer: Self-pay | Admitting: Emergency Medicine

## 2014-03-08 ENCOUNTER — Encounter (HOSPITAL_COMMUNITY)
Admission: EM | Disposition: A | Discharge status: Home / Self Care | Payer: Self-pay | Source: Home / Self Care | Attending: Cardiology

## 2014-03-08 ENCOUNTER — Inpatient Hospital Stay (HOSPITAL_COMMUNITY)
Admission: EM | Admit: 2014-03-08 | Discharge: 2014-03-12 | DRG: 248 | Disposition: A | Discharge status: Home / Self Care | Payer: Medicare Other | Attending: Cardiology | Admitting: Cardiology

## 2014-03-08 DIAGNOSIS — I509 Heart failure, unspecified: Secondary | ICD-10-CM | POA: Diagnosis not present

## 2014-03-08 DIAGNOSIS — Z8249 Family history of ischemic heart disease and other diseases of the circulatory system: Secondary | ICD-10-CM

## 2014-03-08 DIAGNOSIS — I2582 Chronic total occlusion of coronary artery: Secondary | ICD-10-CM | POA: Diagnosis present

## 2014-03-08 DIAGNOSIS — E785 Hyperlipidemia, unspecified: Secondary | ICD-10-CM | POA: Diagnosis present

## 2014-03-08 DIAGNOSIS — I2109 ST elevation (STEMI) myocardial infarction involving other coronary artery of anterior wall: Principal | ICD-10-CM | POA: Diagnosis present

## 2014-03-08 DIAGNOSIS — I059 Rheumatic mitral valve disease, unspecified: Secondary | ICD-10-CM | POA: Diagnosis not present

## 2014-03-08 DIAGNOSIS — Z91040 Latex allergy status: Secondary | ICD-10-CM | POA: Diagnosis not present

## 2014-03-08 DIAGNOSIS — Z7982 Long term (current) use of aspirin: Secondary | ICD-10-CM

## 2014-03-08 DIAGNOSIS — I255 Ischemic cardiomyopathy: Secondary | ICD-10-CM

## 2014-03-08 DIAGNOSIS — Z882 Allergy status to sulfonamides status: Secondary | ICD-10-CM

## 2014-03-08 DIAGNOSIS — J811 Chronic pulmonary edema: Secondary | ICD-10-CM | POA: Diagnosis not present

## 2014-03-08 DIAGNOSIS — I5021 Acute systolic (congestive) heart failure: Secondary | ICD-10-CM | POA: Diagnosis not present

## 2014-03-08 DIAGNOSIS — I2102 ST elevation (STEMI) myocardial infarction involving left anterior descending coronary artery: Secondary | ICD-10-CM | POA: Diagnosis present

## 2014-03-08 DIAGNOSIS — I2589 Other forms of chronic ischemic heart disease: Secondary | ICD-10-CM | POA: Diagnosis present

## 2014-03-08 DIAGNOSIS — Z800 Family history of malignant neoplasm of digestive organs: Secondary | ICD-10-CM

## 2014-03-08 DIAGNOSIS — Z823 Family history of stroke: Secondary | ICD-10-CM | POA: Diagnosis not present

## 2014-03-08 DIAGNOSIS — Z888 Allergy status to other drugs, medicaments and biological substances status: Secondary | ICD-10-CM

## 2014-03-08 DIAGNOSIS — I5043 Acute on chronic combined systolic (congestive) and diastolic (congestive) heart failure: Secondary | ICD-10-CM

## 2014-03-08 DIAGNOSIS — I10 Essential (primary) hypertension: Secondary | ICD-10-CM | POA: Diagnosis present

## 2014-03-08 DIAGNOSIS — I749 Embolism and thrombosis of unspecified artery: Secondary | ICD-10-CM | POA: Diagnosis present

## 2014-03-08 DIAGNOSIS — R079 Chest pain, unspecified: Secondary | ICD-10-CM | POA: Diagnosis not present

## 2014-03-08 DIAGNOSIS — I213 ST elevation (STEMI) myocardial infarction of unspecified site: Secondary | ICD-10-CM

## 2014-03-08 DIAGNOSIS — I2510 Atherosclerotic heart disease of native coronary artery without angina pectoris: Secondary | ICD-10-CM

## 2014-03-08 DIAGNOSIS — I219 Acute myocardial infarction, unspecified: Secondary | ICD-10-CM | POA: Diagnosis not present

## 2014-03-08 HISTORY — PX: LEFT HEART CATHETERIZATION WITH CORONARY ANGIOGRAM: SHX5451

## 2014-03-08 HISTORY — PX: PERCUTANEOUS CORONARY STENT INTERVENTION (PCI-S): SHX5485

## 2014-03-08 HISTORY — DX: Ischemic cardiomyopathy: I25.5

## 2014-03-08 HISTORY — DX: Chronic systolic (congestive) heart failure: I50.22

## 2014-03-08 HISTORY — DX: Atherosclerotic heart disease of native coronary artery without angina pectoris: I25.10

## 2014-03-08 LAB — COMPREHENSIVE METABOLIC PANEL
ALT: 15 U/L (ref 0–35)
AST: 20 U/L (ref 0–37)
Albumin: 3.8 g/dL (ref 3.5–5.2)
Alkaline Phosphatase: 128 U/L — ABNORMAL HIGH (ref 39–117)
Anion gap: 15 (ref 5–15)
BUN: 19 mg/dL (ref 6–23)
CALCIUM: 10.2 mg/dL (ref 8.4–10.5)
CO2: 26 mEq/L (ref 19–32)
Chloride: 102 mEq/L (ref 96–112)
Creatinine, Ser: 0.82 mg/dL (ref 0.50–1.10)
GFR calc non Af Amer: 71 mL/min — ABNORMAL LOW (ref >90)
GFR, EST AFRICAN AMERICAN: 83 mL/min — AB (ref >90)
GLUCOSE: 149 mg/dL — AB (ref 70–99)
Potassium: 4.4 mEq/L (ref 3.7–5.3)
Sodium: 143 mEq/L (ref 137–147)
Total Bilirubin: 0.5 mg/dL (ref 0.3–1.2)
Total Protein: 7.1 g/dL (ref 6.0–8.3)

## 2014-03-08 LAB — PROTIME-INR
INR: 0.98 (ref 0.00–1.49)
Prothrombin Time: 13 seconds (ref 11.6–15.2)

## 2014-03-08 LAB — CBC
HCT: 42.1 % (ref 36.0–46.0)
Hemoglobin: 13.7 g/dL (ref 12.0–15.0)
MCH: 31 pg (ref 26.0–34.0)
MCHC: 32.5 g/dL (ref 30.0–36.0)
MCV: 95.2 fL (ref 78.0–100.0)
PLATELETS: 204 10*3/uL (ref 150–400)
RBC: 4.42 MIL/uL (ref 3.87–5.11)
RDW: 12.8 % (ref 11.5–15.5)
WBC: 9.9 10*3/uL (ref 4.0–10.5)

## 2014-03-08 LAB — POCT I-STAT TROPONIN I: TROPONIN I, POC: 0.07 ng/mL (ref 0.00–0.08)

## 2014-03-08 LAB — MRSA PCR SCREENING: MRSA by PCR: NEGATIVE

## 2014-03-08 LAB — HEMOGLOBIN A1C
Hgb A1c MFr Bld: 6 % — ABNORMAL HIGH (ref <5.7)
MEAN PLASMA GLUCOSE: 126 mg/dL — AB (ref <117)

## 2014-03-08 LAB — APTT: aPTT: 24 seconds (ref 24–37)

## 2014-03-08 LAB — POCT ACTIVATED CLOTTING TIME: Activated Clotting Time: 709 seconds

## 2014-03-08 LAB — TROPONIN I: Troponin I: >20 ng/mL (ref <0.30)

## 2014-03-08 SURGERY — LEFT HEART CATHETERIZATION WITH CORONARY ANGIOGRAM
Anesthesia: LOCAL

## 2014-03-08 MED ORDER — SODIUM CHLORIDE 0.9 % IV SOLN: 1.0000 mL/kg/h | INTRAVENOUS | Status: AC

## 2014-03-08 MED ORDER — NITROGLYCERIN 0.2 MG/ML ON CALL CATH LAB
INTRAVENOUS | Status: AC
Start: 2014-03-08 — End: 2014-03-08
  Filled 2014-03-08: qty 1

## 2014-03-08 MED ORDER — ACETAMINOPHEN 325 MG PO TABS: 650.0000 mg | ORAL_TABLET | ORAL | Status: DC | PRN

## 2014-03-08 MED ORDER — HEPARIN (PORCINE) IN NACL 2-0.9 UNIT/ML-% IJ SOLN
INTRAMUSCULAR | Status: AC
  Filled 2014-03-08: qty 1500

## 2014-03-08 MED ORDER — BIVALIRUDIN 250 MG IV SOLR
INTRAVENOUS | Status: AC
  Filled 2014-03-08: qty 250

## 2014-03-08 MED ORDER — MIDAZOLAM HCL 2 MG/2ML IJ SOLN
INTRAMUSCULAR | Status: AC
  Filled 2014-03-08: qty 2

## 2014-03-08 MED ORDER — ASPIRIN 81 MG PO CHEW: 324.0000 mg | CHEWABLE_TABLET | Freq: Once | ORAL | Status: DC

## 2014-03-08 MED ORDER — ATORVASTATIN CALCIUM 80 MG PO TABS
80.0000 mg | ORAL_TABLET | Freq: Every day | ORAL | Status: DC
  Administered 2014-03-08 – 2014-03-11 (×4): 80 mg via ORAL
  Filled 2014-03-08 (×5): qty 1

## 2014-03-08 MED ORDER — TICAGRELOR 90 MG PO TABS
90.0000 mg | ORAL_TABLET | Freq: Two times a day (BID) | ORAL | Status: DC
  Administered 2014-03-08 – 2014-03-12 (×8): 90 mg via ORAL
  Filled 2014-03-08 (×11): qty 1

## 2014-03-08 MED ORDER — FUROSEMIDE 10 MG/ML IJ SOLN: 20.0000 mg | Freq: Once | INTRAMUSCULAR | Status: DC

## 2014-03-08 MED ORDER — ONDANSETRON HCL 4 MG/2ML IJ SOLN
4.0000 mg | Freq: Four times a day (QID) | INTRAMUSCULAR | Status: DC | PRN
  Administered 2014-03-08: 4 mg via INTRAVENOUS
  Filled 2014-03-08: qty 2

## 2014-03-08 MED ORDER — MORPHINE SULFATE 2 MG/ML IJ SOLN
2.0000 mg | INTRAMUSCULAR | Status: DC | PRN
  Administered 2014-03-08 (×2): 2 mg via INTRAVENOUS
  Filled 2014-03-08 (×2): qty 1

## 2014-03-08 MED ORDER — HEPARIN SODIUM (PORCINE) 1000 UNIT/ML IJ SOLN
INTRAMUSCULAR | Status: AC
  Filled 2014-03-08: qty 1

## 2014-03-08 MED ORDER — TICAGRELOR 90 MG PO TABS
ORAL_TABLET | ORAL | Status: AC
  Filled 2014-03-08: qty 2

## 2014-03-08 MED ORDER — FUROSEMIDE 10 MG/ML IJ SOLN: 40.0000 mg | Freq: Once | INTRAMUSCULAR | Status: DC

## 2014-03-08 MED ORDER — CETYLPYRIDINIUM CHLORIDE 0.05 % MT LIQD
7.0000 mL | Freq: Two times a day (BID) | OROMUCOSAL | Status: DC
  Administered 2014-03-08 – 2014-03-11 (×4): 7 mL via OROMUCOSAL

## 2014-03-08 MED ORDER — ASPIRIN 81 MG PO CHEW
81.0000 mg | CHEWABLE_TABLET | Freq: Every day | ORAL | Status: DC
Start: 2014-03-09 — End: 2014-03-12
  Administered 2014-03-09 – 2014-03-12 (×4): 81 mg via ORAL
  Filled 2014-03-08 (×4): qty 1

## 2014-03-08 MED ORDER — FUROSEMIDE 10 MG/ML IJ SOLN
INTRAMUSCULAR | Status: AC
  Administered 2014-03-08: 40 mg via INTRAVENOUS
  Filled 2014-03-08: qty 4

## 2014-03-08 MED ORDER — NITROGLYCERIN 0.4 MG SL SUBL: 0.4000 mg | SUBLINGUAL_TABLET | SUBLINGUAL | Status: DC | PRN

## 2014-03-08 MED ORDER — LIDOCAINE HCL (PF) 1 % IJ SOLN
INTRAMUSCULAR | Status: AC
  Filled 2014-03-08: qty 30

## 2014-03-08 MED ORDER — HEPARIN SODIUM (PORCINE) 5000 UNIT/ML IJ SOLN
5000.0000 [IU] | Freq: Three times a day (TID) | INTRAMUSCULAR | Status: DC
Start: 2014-03-08 — End: 2014-03-12
  Administered 2014-03-08 – 2014-03-12 (×11): 5000 [IU] via SUBCUTANEOUS
  Filled 2014-03-08 (×15): qty 1

## 2014-03-08 MED ORDER — MORPHINE SULFATE 4 MG/ML IJ SOLN: 4.0000 mg | Freq: Once | INTRAMUSCULAR | Status: DC

## 2014-03-08 MED ORDER — TICAGRELOR 90 MG PO TABS: 90.0000 mg | ORAL_TABLET | Freq: Two times a day (BID) | ORAL | Status: DC

## 2014-03-08 MED ORDER — FENTANYL CITRATE 0.05 MG/ML IJ SOLN
INTRAMUSCULAR | Status: AC
  Filled 2014-03-08: qty 2

## 2014-03-08 MED ORDER — FUROSEMIDE 10 MG/ML IJ SOLN
INTRAMUSCULAR | Status: AC
  Filled 2014-03-08: qty 2

## 2014-03-08 MED ORDER — SODIUM CHLORIDE 0.9 % IV SOLN: INTRAVENOUS | Status: DC

## 2014-03-08 MED ORDER — VERAPAMIL HCL 2.5 MG/ML IV SOLN
INTRAVENOUS | Status: AC
  Filled 2014-03-08: qty 2

## 2014-03-08 MED ORDER — FUROSEMIDE 10 MG/ML IJ SOLN
40.0000 mg | INTRAMUSCULAR | Status: AC
  Administered 2014-03-08: 40 mg via INTRAVENOUS

## 2014-03-08 MED ORDER — ASPIRIN 81 MG PO CHEW: 81.0000 mg | CHEWABLE_TABLET | Freq: Every day | ORAL | Status: DC

## 2014-03-08 MED ORDER — HEPARIN SODIUM (PORCINE) 5000 UNIT/ML IJ SOLN
60.0000 [IU]/kg | INTRAMUSCULAR | Status: AC
  Administered 2014-03-08: 4000 [IU] via INTRAVENOUS

## 2014-03-08 NOTE — Progress Notes (Signed)
Responded to page to provide support to patient and family.  Patient came into Ed by EMS  with Chest pains and was later sent to cath lab.   Chaplain spoke with patient's husband who indicated that patient had had some changes in her health relating to her heart and requested that she be brought to ED for evaluation.  Husband ask that I pray with him and for patient.  Husband is calm and positive about patient outcome.  Chaplain provided ministry of  presence,emotional and spiritual support, empathetic listening and conversation. Supported Biochemist, clinical and promoted information sharing.  Will follow as needed.  03/08/14 1100  Clinical Encounter Type  Visited With Family;Patient not available;Health care provider  Visit Type Initial;Spiritual support;Pre-op  Referral From Nurse  Spiritual Encounters  Spiritual Needs Prayer;Emotional  Stress Factors  Patient Stress Factors None identified  Family Stress Factors Health changes  Jaclynn Major, [place]

## 2014-03-08 NOTE — Progress Notes (Signed)
Called to see, pt with increased SOB and now with rales throughout.  sats decreased and her oxygen per [place] has been increased.   EF is 25-30% and grade 2 diastolic dysfunction. Cannot rule out apical thrombus.  Will recheck Echo with definity in AM.  BP improved from earlier, will give lasix. Dr. P. Martinique evaluated her as well.   Heart RRR Lungs with wet rales throughout.

## 2014-03-08 NOTE — Care Management Note (Addendum)
    Page 1 of 1   03/12/2014     2:01:03 PM CARE MANAGEMENT NOTE 03/12/2014  Patient:  Kelli Roy, Kelli Roy   Account Number:  [id number]  Date Initiated:  03/08/2014  Documentation initiated by:  Elissa Hefty  Subjective/Objective Assessment:   adm w mi     Action/Plan:   lives w husband, pcp dr Delfino Lovett pang   Anticipated DC Date:     Anticipated DC Plan:        [place]  CM consult  Medication Assistance      Choice offered to / List presented to:             Status of service:  Completed, signed off Medicare Important Message given?  YES (If response is "NO", the following Medicare IM given date fields will be blank) Date Medicare IM given:  03/12/2014 Medicare IM given by:  [facility] Date Additional Medicare IM given:   Additional Medicare IM given by:    Discharge Disposition:  HOME/SELF CARE  Per UR Regulation:  Reviewed for med. necessity/level of care/duration of stay  If discussed at [place] of Stay Meetings, dates discussed:    Comments:  9/4  1410 debbie dowell rn,bsn gave pt 30day free and copay assist card for brilinta. pt states has drug plan w [employer] medic[employer] complete for meds.

## 2014-03-08 NOTE — Progress Notes (Signed)
Echocardiogram 2D Echocardiogram has been performed.  Kelli Roy 03/08/2014, 2:36 PM

## 2014-03-08 NOTE — Progress Notes (Signed)
Cecilie Kicks notified of pt coughing, with coarse crackles audible in bases and increased oxygen requirements.  Will continue to monitor pt closely.

## 2014-03-08 NOTE — ED Notes (Signed)
Pt to ED via [facility] for evaluation of chest pain onset this AM- pt has had intermittent CP for the past 2 days- pt was seen here last night for the same and dx with reflux.  Pt currently c/o chest pain non radiating to center of chest that started 1 hour prior to arrival - 324 ASA and 1 Nitro given prior to arrival.  Denies relief of pain with Nitro.  Pt denies SOB, admits to some nausea.  NSR on monitor, IV in place.

## 2014-03-08 NOTE — ED Provider Notes (Signed)
Medical screening examination/treatment/procedure(s) were conducted as a shared visit with non-physician practitioner(s) and myself.  I personally evaluated the patient during the encounter  Please see my separate respective documentation pertaining to this patient encounter   Johnna Acosta, MD 03/08/14 [phone]

## 2014-03-08 NOTE — CV Procedure (Signed)
    Cardiac Catheterization Procedure Note  Name: Kelli Roy MRN: [id number] DOB: 31-Dec-1945  Procedure: Left Heart Cath, Selective Coronary Angiography, LV angiography, PTCA and stenting of the proximal LAD  Indication: 68 yo WF with history of HTN, HL, and family history of CAD presents with an anterior STEMI.  Procedural Details:  The right wrist was prepped, draped, and anesthetized with 1% lidocaine. Using the modified Seldinger technique, a 6 French slender sheath was introduced into the right radial artery. 3 mg of verapamil was administered through the sheath, weight-based unfractionated heparin was administered intravenously. Standard Judkins catheters were used for selective coronary angiography and left ventriculography. Catheter exchanges were performed over an exchange length guidewire.  PROCEDURAL FINDINGS Hemodynamics: AO 90/53 mean 69 mm Hg LV 89/27 mm Hg   Coronary angiography: Coronary dominance: right  Left mainstem: There is 50-70% stenosis in the distal left main.  Left anterior descending (LAD): 70-80% ostial LAD disease. The proximal LAD is 100% occluded.  Left circumflex (LCx): There is 50-70% ostial LCx disease. The proximal vessel has 40-50% stenosis.  Right coronary artery (RCA): The RCA is a dominant vessel. There is diffuse 50% disease in the proximal to mid vessel.  Left ventriculography: Left ventricular systolic function is assessed post PCI. LVEF is estimated at 25-30%. There is akinesis of the Mid anterior wall and distal inferior wall. There is dyskinesis of the distal anterior wall and apex. There is moderate MR.  PCI Note:  Following the diagnostic procedure, the decision was made to proceed with PCI.  Weight-based bivalirudin was given for anticoagulation. Brilinta 180 mg was given orally. Once a therapeutic ACT was achieved, a 6 [place] XBLAD 3.5  guide catheter was inserted.  A prowater coronary guidewire was used to cross the lesion.  The  lesion was predilated with a 2.5 mm balloon.  The lesion was then stented with a 2.75 x 20 mm Veriflex stent.  The stent was postdilated with a 3.0 mm noncompliant balloon.  Following PCI, there was 0% residual stenosis and TIMI-3 flow. Final angiography confirmed an excellent result. The patient tolerated the procedure well and was pain free at the end of procedure. There were no immediate procedural complications. A TR band was used for radial hemostasis. The patient was transferred to the post catheterization recovery area for further monitoring.  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 2. Severe LV dysfunction. 3. Elevated LVEDP 4. Successful stenting of the proximal LAD with a BMS.   Recommendations:  Continue DAPT. Will transfer to ICU for further management. Echo ordered. Hold beta blocker and ACEi for now due to low BP. I think ultimately the patient will need to be considered for CABG for management of Left main and ostial LAD/LCx disease. For this reason a BMS was used.    Peter Martinique, [place] 03/08/2014, 11:08 AM

## 2014-03-08 NOTE — H&P (Signed)
Physician History and Physical    Patient ID: Kelli Roy MRN: [id number] DOB/AGE: 69-Feb-1947 69 y.o. Admit date: 03/08/2014  Primary Care Physician: Tommy Medal, MD Primary Cardiologist N/A  HPI: 68 yo WF with history of HTN and hyperlipidema presents with an anterior STEMI this am. She was seen in the ED yesterday with intermittent chest pain. Ecg was normal. Labs and CXR were normal. She was DC home. Today at 7:15 am she developed acute substernal chest pain that was more severe. Pain radiated into throat and was associated with diaphoresis and nausea. She presented again to the ED and Ecg demonstrated ST elevation in the anterior leads. She had remote cardiac cath in 2001 that was normal. A stress Echo in 2010 showed ST changes but normal Echo findings. She has not been on cholesterol lowering therapy. No history of bleeding or TIA/CVA.  Review of systems complete and found to be negative unless listed above  Past Medical History  Diagnosis Date  . Hypertension   . Hyperlipidemia   . Sinus drainage   . Chest pain, unspecified   . Malaise and fatigue   . Insomnia   . Shortness of breath   . Family history of colon cancer   . Family history of cardiovascular disease   . History of long-term treatment with high-risk medication   . Sinus headache   . Alopecia     Family History  Problem Relation Age of Onset  . Stroke Mother   . Heart attack Mother   . Peripheral vascular disease Father     had stent in leg  . Colon cancer Father   . Atrial fibrillation Father     with pacemaker  . Thyroid disease Father   . Pancreatic cancer Brother   . Coronary artery disease Brother     with Stent  . Coronary artery disease Brother     with CABG in his 66's  . Thyroid disease Brother   . Thyroid disease Sister     History   Social History  . Marital Status: Married    Spouse Name: N/A    Number of Children: N/A  . Years of Education: N/A   Occupational History  . Not on  file.   Social History Main Topics  . Smoking status: Never Smoker   . Smokeless tobacco: Never Used  . Alcohol Use: Yes     Comment: rare glass of wine  . Drug Use: No  . Sexual Activity: Yes    Birth Control/ Protection: Post-menopausal   Other Topics Concern  . Not on file   Social History Narrative  . No narrative on file    Past Surgical History  Procedure Laterality Date  . Cardiac catheterization  2001    showed no blockages  . Cystectomy  1998    fibroid cysts  . 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 Cervical spine surgery  09/27/2003    C6-67 servical fusion  . Dilation and curettage of uterus  1976    2nd to miscarriage  . Cataract extraction w/phaco Left 01/04/2014    Procedure: CATARACT EXTRACTION PHACO AND INTRAOCULAR LENS PLACEMENT (IOC);  Surgeon: Tonny Branch, MD;  Location: [facility];  Service: Ophthalmology;  Laterality: Left;  CDE:5.65  . Cataract extraction w/phaco Right 01/29/2014    Procedure: CATARACT EXTRACTION PHACO AND INTRAOCULAR LENS PLACEMENT RIGHT EYE CDE=4.49;  Surgeon: Tonny Branch, MD;  Location: [facility];  Service: Ophthalmology;  Laterality: Right;     Prescriptions prior to admission  Medication Sig Dispense Refill  . amLODipine (NORVASC) 5 MG tablet Take 5 mg by mouth daily.      Marland Kitchen aspirin EC 81 MG tablet Take 81 mg by mouth daily.      . Calcium Carbonate-Vitamin D (CALTRATE 600+D) 600-400 MG-UNIT per tablet Take 1 tablet by mouth daily.      . Cholecalciferol (VITAMIN D) 2000 UNITS tablet Take 2,000 Units by mouth daily.      . eszopiclone (LUNESTA) 1 MG TABS tablet Take 0.5 mg by mouth at bedtime as needed for sleep. Take immediately before bedtime      . Glucosamine-Chondroit-Vit C-Mn (GLUCOSAMINE CHONDR 1500 COMPLX PO) Take 2 tablets by mouth daily.      . irbesartan (AVAPRO) 300 MG tablet Take 300 mg by mouth daily.      . Magnesium 400 MG CAPS Take 400 mg by mouth daily.      . Multiple  Vitamins-Minerals (ALIVE WOMENS ENERGY PO) Take 1 tablet by mouth daily.      . Omega-3 Fatty Acids (FISH OIL) 1200 MG CAPS Take 2 capsules by mouth daily.      . ranitidine (ZANTAC) 150 MG tablet Take 1 tablet (150 mg total) by mouth 2 (two) times daily.  60 tablet  0  . vitamin B-12 (CYANOCOBALAMIN) 1000 MCG tablet Take 1,000 mcg by mouth daily.        Physical Exam: Blood pressure 121/65, pulse 84, temperature 97.7 F (36.5 C), temperature source Oral, resp. rate 22, height 5\' 5"  (1.651 m), weight 160 lb 0.9 oz (72.6 kg), SpO2 97.00%. Current Weight  03/08/14 160 lb 0.9 oz (72.6 kg)  03/08/14 160 lb 0.9 oz (72.6 kg)  01/29/14 160 lb (72.576 kg)    GENERAL:  Well appearing WF in moderate distress. HEENT:  PERRL, EOMI, sclera are clear. Oropharynx is clear. NECK:  No jugular venous distention, carotid upstroke brisk and symmetric, no bruits, no thyromegaly or adenopathy LUNGS:  Clear to auscultation bilaterally CHEST:  Unremarkable HEART:  RRR,  PMI not displaced or sustained,S1 and S2 within normal limits, no S3, no S4: no clicks, no rubs, no murmurs ABD:  Soft, nontender. BS +, no masses or bruits. No hepatomegaly, no splenomegaly EXT:  2 + pulses throughout, no edema, no cyanosis no clubbing SKIN:  Warm and dry.  No rashes NEURO:  Alert and oriented x 3. Cranial nerves II through XII intact. PSYCH:  Cognitively intact    Labs:   Lab Results  Component Value Date   WBC 9.9 03/08/2014   HGB 13.7 03/08/2014   HCT 42.1 03/08/2014   MCV 95.2 03/08/2014   PLT 204 03/08/2014    Recent Labs Lab 03/08/14 0944  NA 143  K 4.4  CL 102  CO2 26  BUN 19  CREATININE 0.82  CALCIUM 10.2  PROT 7.1  BILITOT 0.5  ALKPHOS 128*  ALT 15  AST 20  GLUCOSE 149*   Lab Results  Component Value Date   TROPONINI <0.30 03/07/2014    No results found for this basename: CHOL   No results found for this basename: HDL   No results found for this basename: LDLCALC   No results found for this  basename: TRIG   No results found for this basename: CHOLHDL   No results found for this basename: LDLDIRECT    Lab Results  Component Value Date   PROBNP 112.8 03/07/2014   No results  found for this basename: TSH   No results found for this basename: HGBA1C    Radiology: Dg Chest 2 View  03/07/2014   CLINICAL DATA:  chest pain  EXAM: CHEST - 2 VIEW  COMPARISON:  07/08/2009 and earlier studies  FINDINGS: Lungs are clear. Heart size and mediastinal contours are within normal limits. No effusion. Cervical fixation hardware noted.  IMPRESSION: No acute cardiopulmonary disease.   Electronically Signed   By: Arne Cleveland M.D.   On: 03/07/2014 23:12    EKG: NSR, ST elevation in I, V1-4 c/w anterior MI  ASSESSMENT AND PLAN:  1. Acute anterior STEMI 2. HTN 3. Hyperlipidemia.  4. Family history of early CAD  Plan: emergent Cardiac cath with PCI.   Signed: Esias Mory [place], [place]  03/08/2014, 1:19 PM

## 2014-03-08 NOTE — ED Provider Notes (Signed)
CSN: [id number]     Arrival date & time 03/08/14  0913 History   First MD Initiated Contact with Patient 03/08/14 [phone]     Chief Complaint  Patient presents with  . Chest Pain    Patient is a 68 y.o. female presenting with chest pain. The history is provided by the patient.  Chest Pain Pain location:  Substernal area Pain quality: aching and pressure   Radiates to: throat. Pain radiates to the back: no   Pain severity:  Severe Duration: Off an on for last two days.  This epsisode about 0730 am. Timing:  Intermittent Progression:  Worsening Chronicity:  New Context: movement and at rest   Relieved by:  Nothing Ineffective treatments:  Nitroglycerin and aspirin Associated symptoms: nausea and shortness of breath   Associated symptoms: no abdominal pain, no fever and not vomiting   Risk factors: high cholesterol and hypertension   Risk factors: no smoking     Past Medical History  Diagnosis Date  . Hypertension   . Hyperlipidemia   . Sinus drainage   . Chest pain, unspecified   . Malaise and fatigue   . Insomnia   . Shortness of breath   . Family history of colon cancer   . Family history of cardiovascular disease   . History of long-term treatment with high-risk medication   . Sinus headache   . Alopecia    Past Surgical History  Procedure Laterality Date  . Cardiac catheterization  2001    showed no blockages  . Cystectomy  1998    fibroid cysts  . 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 Cervical spine surgery  09/27/2003    C6-67 servical fusion  . Dilation and curettage of uterus  1976    2nd to miscarriage  . Cataract extraction w/phaco Left 01/04/2014    Procedure: CATARACT EXTRACTION PHACO AND INTRAOCULAR LENS PLACEMENT (IOC);  Surgeon: Tonny Branch, MD;  Location: [facility];  Service: Ophthalmology;  Laterality: Left;  CDE:5.65  . Cataract extraction w/phaco Right 01/29/2014    Procedure: CATARACT EXTRACTION PHACO AND  INTRAOCULAR LENS PLACEMENT RIGHT EYE CDE=4.49;  Surgeon: Tonny Branch, MD;  Location: [facility];  Service: Ophthalmology;  Laterality: Right;   Family History  Problem Relation Age of Onset  . Stroke Mother   . Heart attack Mother   . Peripheral vascular disease Father     had stent in leg  . Colon cancer Father   . Atrial fibrillation Father     with pacemaker  . Thyroid disease Father   . Pancreatic cancer Brother   . Coronary artery disease Brother     with Stent  . Coronary artery disease Brother     with CABG in his 33's  . Thyroid disease Brother   . Thyroid disease Sister    History  Substance Use Topics  . Smoking status: Never Smoker   . Smokeless tobacco: Never Used  . Alcohol Use: Yes     Comment: rare glass of wine   OB History   Grav Para Term Preterm Abortions TAB SAB Ect Mult Living                 Review of Systems  Constitutional: Negative for fever.  Respiratory: Positive for shortness of breath.   Cardiovascular: Positive for chest pain.  Gastrointestinal: Positive for nausea. Negative for vomiting and abdominal pain.  All other systems reviewed and are negative.  Allergies  Demerol; Dexilant; Meperidine hcl; Sulfonamide derivatives; and Latex  Home Medications   Prior to Admission medications   Medication Sig Start Date End Date Taking? Authorizing Provider  amLODipine (NORVASC) 5 MG tablet Take 5 mg by mouth daily.    Historical Provider, MD  aspirin EC 81 MG tablet Take 81 mg by mouth daily.    Historical Provider, MD  Calcium Carbonate-Vitamin D (CALTRATE 600+D) 600-400 MG-UNIT per tablet Take 1 tablet by mouth daily.    Historical Provider, MD  Cholecalciferol (VITAMIN D) 2000 UNITS tablet Take 2,000 Units by mouth daily.    Historical Provider, MD  eszopiclone (LUNESTA) 1 MG TABS tablet Take 0.5 mg by mouth at bedtime as needed for sleep. Take immediately before bedtime    Historical Provider, MD  Glucosamine-Chondroit-Vit C-Mn (GLUCOSAMINE  CHONDR 1500 COMPLX PO) Take 2 tablets by mouth daily.    Historical Provider, MD  irbesartan (AVAPRO) 300 MG tablet Take 300 mg by mouth daily.    Historical Provider, MD  Magnesium 400 MG CAPS Take 400 mg by mouth daily.    Historical Provider, MD  Multiple Vitamins-Minerals (ALIVE WOMENS ENERGY PO) Take 1 tablet by mouth daily.    Historical Provider, MD  Omega-3 Fatty Acids (FISH OIL) 1200 MG CAPS Take 2 capsules by mouth daily.    Historical Provider, MD  ranitidine (ZANTAC) 150 MG tablet Take 1 tablet (150 mg total) by mouth 2 (two) times daily. 03/07/14   Britt Bottom, NP  vitamin B-12 (CYANOCOBALAMIN) 1000 MCG tablet Take 1,000 mcg by mouth daily.    Historical Provider, MD   BP 116/64  Pulse 79  Temp(Src) 97.7 F (36.5 C) (Oral)  Resp 20  SpO2 99% Physical Exam  Nursing note and vitals reviewed. Constitutional: She appears well-developed and well-nourished. No distress.  HENT:  Head: Normocephalic and atraumatic.  Right Ear: External ear normal.  Left Ear: External ear normal.  Eyes: Conjunctivae are normal. Right eye exhibits no discharge. Left eye exhibits no discharge. No scleral icterus.  Neck: Neck supple. No tracheal deviation present.  Cardiovascular: Normal rate, regular rhythm and intact distal pulses.   Pulmonary/Chest: Effort normal and breath sounds normal. No stridor. No respiratory distress. She has no wheezes. She has no rales.  Abdominal: Soft. Bowel sounds are normal. She exhibits no distension. There is no tenderness. There is no rebound and no guarding.  Musculoskeletal: She exhibits no edema and no tenderness.  Neurological: She is alert. She has normal strength. No cranial nerve deficit (no facial droop, extraocular movements intact, no slurred speech) or sensory deficit. She exhibits normal muscle tone. She displays no seizure activity. Coordination normal.  Skin: Skin is warm and dry. No rash noted.  Psychiatric: She has a normal mood and affect.     ED Course  Procedures (including critical care time) CRITICAL CARE Performed by: WIOXB,DZH Total critical care time: 35 Critical care time was exclusive of separately billable procedures and treating other patients. Critical care was necessary to treat or prevent imminent or life-threatening deterioration. Critical care was time spent personally by me on the following activities: development of treatment plan with patient and/or surrogate as well as nursing, discussions with consultants, evaluation of patient's response to treatment, examination of patient, obtaining history from patient or surrogate, ordering and performing treatments and interventions, ordering and review of laboratory studies, ordering and review of radiographic studies, pulse oximetry and re-evaluation of patient's condition.  Labs Review Labs Reviewed  APTT  CBC  COMPREHENSIVE METABOLIC  PANEL  PROTIME-INR  Randolm Idol, ED    Imaging Review Dg Chest 2 View  03/07/2014   CLINICAL DATA:  chest pain  EXAM: CHEST - 2 VIEW  COMPARISON:  07/08/2009 and earlier studies  FINDINGS: Lungs are clear. Heart size and mediastinal contours are within normal limits. No effusion. Cervical fixation hardware noted.  IMPRESSION: No acute cardiopulmonary disease.   Electronically Signed   By: Arne Cleveland M.D.   On: 03/07/2014 23:12     EKG Interpretation   Date/Time:  Thursday March 08 2014 09:23:53 EDT Ventricular Rate:  68 PR Interval:  139 QRS Duration: 79 QT Interval:  385 QTC Calculation: 409 R Axis:   62 Text Interpretation:  Age not entered, assumed to be  68 years old for  purpose of ECG interpretation Sinus rhythm Low voltage, precordial leads  st elevation concerning for acute ischemia changed since prior ECG  Confirmed by Wyatte Dames  MD-J, Alissah Redmon ([id number]) on 03/08/2014 9:37:12 AM     Code Stemi called at 2703 by me after review of the EKG.  MDM   Final diagnoses:  ST elevation myocardial infarction  (STEMI), unspecified artery    Pt seen in the ED yesterday for chest discomfort.  Negative workup.   Stuttering sx last few days. EKG today shows new changes concerning for ischemia.  Active pain in the ED.  Will start heparin, morphine.  Code stemi called, plan on cardiac cath.     Dorie Rank, MD 03/08/14 [phone]

## 2014-03-09 DIAGNOSIS — I5043 Acute on chronic combined systolic (congestive) and diastolic (congestive) heart failure: Secondary | ICD-10-CM

## 2014-03-09 DIAGNOSIS — I5021 Acute systolic (congestive) heart failure: Secondary | ICD-10-CM

## 2014-03-09 DIAGNOSIS — I10 Essential (primary) hypertension: Secondary | ICD-10-CM

## 2014-03-09 DIAGNOSIS — I2109 ST elevation (STEMI) myocardial infarction involving other coronary artery of anterior wall: Secondary | ICD-10-CM

## 2014-03-09 DIAGNOSIS — E785 Hyperlipidemia, unspecified: Secondary | ICD-10-CM

## 2014-03-09 LAB — BASIC METABOLIC PANEL
ANION GAP: 14 (ref 5–15)
BUN: 17 mg/dL (ref 6–23)
CO2: 24 mEq/L (ref 19–32)
Calcium: 9.5 mg/dL (ref 8.4–10.5)
Chloride: 101 mEq/L (ref 96–112)
Creatinine, Ser: 0.81 mg/dL (ref 0.50–1.10)
GFR calc Af Amer: 84 mL/min — ABNORMAL LOW (ref >90)
GFR, EST NON AFRICAN AMERICAN: 72 mL/min — AB (ref >90)
GLUCOSE: 155 mg/dL — AB (ref 70–99)
Potassium: 4.1 mEq/L (ref 3.7–5.3)
SODIUM: 139 meq/L (ref 137–147)

## 2014-03-09 LAB — CBC
HCT: 41.2 % (ref 36.0–46.0)
Hemoglobin: 13.9 g/dL (ref 12.0–15.0)
MCH: 31.2 pg (ref 26.0–34.0)
MCHC: 33.7 g/dL (ref 30.0–36.0)
MCV: 92.4 fL (ref 78.0–100.0)
Platelets: 226 10*3/uL (ref 150–400)
RBC: 4.46 MIL/uL (ref 3.87–5.11)
RDW: 12.9 % (ref 11.5–15.5)
WBC: 13.7 10*3/uL — AB (ref 4.0–10.5)

## 2014-03-09 LAB — LIPID PANEL
CHOLESTEROL: 264 mg/dL — AB (ref 0–200)
HDL: 60 mg/dL (ref >39)
LDL Cholesterol: 183 mg/dL — ABNORMAL HIGH (ref 0–99)
TRIGLYCERIDES: 103 mg/dL (ref <150)
Total CHOL/HDL Ratio: 4.4 RATIO
VLDL: 21 mg/dL (ref 0–40)

## 2014-03-09 LAB — TROPONIN I: Troponin I: >20 ng/mL (ref <0.30)

## 2014-03-09 MED ORDER — DIPHENHYDRAMINE HCL 50 MG/ML IJ SOLN: 12.5000 mg | Freq: Once | INTRAMUSCULAR | Status: DC

## 2014-03-09 MED ORDER — FUROSEMIDE 10 MG/ML IJ SOLN: 40.0000 mg | Freq: Two times a day (BID) | INTRAMUSCULAR | Status: DC

## 2014-03-09 MED ORDER — DIPHENHYDRAMINE HCL 25 MG PO CAPS
25.0000 mg | ORAL_CAPSULE | Freq: Four times a day (QID) | ORAL | Status: DC | PRN
  Administered 2014-03-09 – 2014-03-11 (×4): 25 mg via ORAL
  Filled 2014-03-09 (×4): qty 1

## 2014-03-09 MED ORDER — DIPHENHYDRAMINE HCL 25 MG PO CAPS: 25.0000 mg | ORAL_CAPSULE | Freq: Four times a day (QID) | ORAL | Status: DC | PRN

## 2014-03-09 MED ORDER — FUROSEMIDE 10 MG/ML IJ SOLN
40.0000 mg | Freq: Once | INTRAMUSCULAR | Status: AC
  Administered 2014-03-09: 40 mg via INTRAVENOUS
  Filled 2014-03-09: qty 4

## 2014-03-09 MED FILL — Sodium Chloride IV Soln 0.9%: INTRAVENOUS | Qty: 50 | Status: AC

## 2014-03-09 NOTE — Progress Notes (Signed)
     Patient complaining of itching and rash on UE and LE with erythema. Patient not in distress and feeling okay otherwise. Will give PO benadryl PRN q6hrs. She is worried this will make her too sleepy and she is on Lasix and will need to get up to go to the bathroom. I told her to take one dose and if it makes her too sleepy she does not have to take it again.   Angelena Form PA-C  [facility]

## 2014-03-09 NOTE — Clinical Documentation Improvement (Signed)
PLEASE SPECIFY TYPE AND ACUITY OF CHF: Possible Clinical Conditions?  Chronic Systolic Congestive Heart Failure Chronic Diastolic Congestive Heart Failure Chronic Systolic & Diastolic Congestive Heart Failure Acute Systolic Congestive Heart Failure Acute Diastolic Congestive Heart Failure Acute Systolic & Diastolic Congestive Heart Failure Acute on Chronic Systolic Congestive Heart Failure Acute on Chronic Diastolic Congestive Heart Failure Acute on Chronic Systolic & Diastolic Congestive Heart Failure Other Condition Cannot Clinically Determine  Supporting Information:(As per notes) "Acute left heart failure (LVEF 25-30% by echo) with pulmonary edema responding to Lasix and percutaneous coronary intervention with LAD recanalization."   Thank You, Alessandra Grout, RN, BSN, CCDS,Clinical Documentation Specialist:  [phone]  [phone]=[facility] [place]- Health Information Management

## 2014-03-09 NOTE — Progress Notes (Signed)
       Patient Name: Kelli Roy Date of Encounter: 03/09/2014    SUBJECTIVE: The patient is doing well this morning. She said no recurrent angina and denies dyspnea. She did develop congestive heart failure that improved after IV Lasix last evening. She denies orthopnea.  TELEMETRY:  Normal sinus rhythm Filed Vitals:   03/09/14 0700 03/09/14 0745 03/09/14 0800 03/09/14 1145  BP: 106/63  85/51   Pulse: 73  80   Temp:  98 F (36.7 C)  97.5 F (36.4 C)  TempSrc:  Oral  Oral  Resp: 18  17 15   Height:      Weight:      SpO2: 98%  98% 96%    Intake/Output Summary (Last 24 hours) at 03/09/14 1256 Last data filed at 03/09/14 1000  Gross per 24 hour  Intake   1293 ml  Output   1300 ml  Net     -7 ml   LABS: Basic Metabolic Panel:  Recent Labs  03/08/14 0944 03/09/14  NA 143 139  K 4.4 4.1  CL 102 101  CO2 26 24  GLUCOSE 149* 155*  BUN 19 17  CREATININE 0.82 0.81  CALCIUM 10.2 9.5   CBC:  Recent Labs  03/08/14 0944 03/09/14  WBC 9.9 13.7*  HGB 13.7 13.9  HCT 42.1 41.2  MCV 95.2 92.4  PLT 204 226   Cardiac Enzymes:  Recent Labs  03/08/14 1700 03/08/14 2351 03/09/14 0620  TROPONINI >20.00* >20.00* >20.00*   BNP    Component Value Date/Time   PROBNP 112.8 03/07/2014 2057    Electrocardiogram: Normal sinus rhythm with poor R-wave progression in precordial T-wave abnormality. Perform 03/09/2014  Hemoglobin A1C:  Recent Labs  03/08/14 0944  HGBA1C 6.0*   Fasting Lipid Panel:  Recent Labs  03/09/14  CHOL 264*  HDL 60  LDLCALC 183*  TRIG 103  CHOLHDL 4.4    Radiology/Studies:  Pulmonary edema on chest x-ray post PCI, 03/08/2014  Physical Exam: Blood pressure 85/51, pulse 80, temperature 97.5 F (36.4 C), temperature source Oral, resp. rate 15, height 5\' 5"  (1.651 m), weight 160 lb 0.9 oz (72.6 kg), SpO2 96.00%. Weight change:   Wt Readings from Last 3 Encounters:  03/08/14 160 lb 0.9 oz (72.6 kg)  03/08/14 160 lb 0.9 oz (72.6 kg)    01/29/14 160 lb (72.576 kg)   Neck veins are flat. A loud S4 gallop is heard. No rub or murmur. Abdomen is soft. No edema is noted. Radial cath site is unremarkable.  ASSESSMENT:  1. Proximal LAD total occlusion treated with bare-metal stent in setting of anterior ST elevation myocardial infarction 2. Severe residual coronary disease including distal left main and ostial circumflex/LAD 70-80% stenoses. Right coronary contains diffuse 50% proximal to mid narrowing. No recurrent angina since treatment of the proximal LAD in the acute setting. 3. Acute left heart failure (LVEF 25-30% by echo) with pulmonary edema responding to Lasix and percutaneous coronary intervention with LAD recanalization. Clinically improved. 4. Hypertension  Plan:  1. Institute beta blocker therapy as tolerated by blood pressure and hemodynamics 2. Resume renin angiotensin blockade 1 blood pressure allows 3. Dual antiplatelet therapy for at least 4 weeks 4. Life-Vest prior to discharge 5. Statin therapy 6. Diuretic therapy as tolerated by blood pressure and has required to render heart failure asymptomatic   Signed, Sinclair Grooms 03/09/2014, 12:56 PM

## 2014-03-09 NOTE — Progress Notes (Addendum)
CARDIAC REHAB PHASE I   PRE:  Rate/Rhythm: 80 SR  BP:  Supine: 94/68  Sitting:   Standing:    SaO2: 98 2L 94 RA  MODE:  Ambulation: 270 ft   POST:  Rate/Rhythm: 92 Sr  BP:  Supine:   Sitting: 87/52  Standing:    SaO2: 93 RA 1330-1455 Pt tolerated ambulation well without c/o of cp or SOB. Pt to recliner after walk with call light in reach. Completed MI, stent, CHF and pre-op education with pt. She voices understanding. I gave her MI, CHF and heart surgery education booklets. We discussed sternal precautions, use of IS and walking post-op. Pt is not appropriate for Outpt. CRP due to [facility] in a month.   Rodney Langton RN 03/09/2014 2:47 PM

## 2014-03-10 DIAGNOSIS — I219 Acute myocardial infarction, unspecified: Secondary | ICD-10-CM

## 2014-03-10 MED ORDER — HYDROCORTISONE 1 % EX CREA
TOPICAL_CREAM | Freq: Two times a day (BID) | CUTANEOUS | Status: DC
  Administered 2014-03-10 – 2014-03-11 (×3): 1 via TOPICAL
  Administered 2014-03-11 – 2014-03-12 (×2): via TOPICAL
  Filled 2014-03-10: qty 28

## 2014-03-10 MED ORDER — LISINOPRIL 5 MG PO TABS
5.0000 mg | ORAL_TABLET | Freq: Every day | ORAL | Status: DC
  Administered 2014-03-11: 5 mg via ORAL
  Filled 2014-03-10 (×2): qty 1

## 2014-03-10 MED ORDER — CARVEDILOL 3.125 MG PO TABS
3.1250 mg | ORAL_TABLET | Freq: Two times a day (BID) | ORAL | Status: DC
  Administered 2014-03-11 – 2014-03-12 (×2): 3.125 mg via ORAL
  Filled 2014-03-10 (×6): qty 1

## 2014-03-10 MED ORDER — LISINOPRIL 2.5 MG PO TABS
2.5000 mg | ORAL_TABLET | Freq: Every day | ORAL | Status: DC
  Filled 2014-03-10: qty 1

## 2014-03-10 NOTE — Progress Notes (Signed)
       Patient Name: Kelli Roy Date of Encounter: 03/10/2014    SUBJECTIVE: The patient is doing well this morning. She said no recurrent angina and denies dyspnea.  She has a diffuse macular rash across her trunk which is itching.  TELEMETRY:  Normal sinus rhythm Filed Vitals:   03/10/14 0300 03/10/14 0400 03/10/14 0600 03/10/14 0745  BP: 98/60 86/38 90/51  100/51  Pulse: 85     Temp: 98.5 F (36.9 C)   98.8 F (37.1 C)  TempSrc: Oral   Oral  Resp: 16     Height:      Weight:      SpO2: 94%   98%    Intake/Output Summary (Last 24 hours) at 03/10/14 1022 Last data filed at 03/10/14 0200  Gross per 24 hour  Intake    320 ml  Output    400 ml  Net    -80 ml   LABS: Basic Metabolic Panel:  Recent Labs  03/08/14 0944 03/09/14  NA 143 139  K 4.4 4.1  CL 102 101  CO2 26 24  GLUCOSE 149* 155*  BUN 19 17  CREATININE 0.82 0.81  CALCIUM 10.2 9.5   CBC:  Recent Labs  03/08/14 0944 03/09/14  WBC 9.9 13.7*  HGB 13.7 13.9  HCT 42.1 41.2  MCV 95.2 92.4  PLT 204 226   Cardiac Enzymes:  Recent Labs  03/08/14 1700 03/08/14 2351 03/09/14 0620  TROPONINI >20.00* >20.00* >20.00*   BNP    Component Value Date/Time   PROBNP 112.8 03/07/2014 2057    Electrocardiogram: Normal sinus rhythm with poor R-wave progression in precordial T-wave abnormality. Perform 03/09/2014  Hemoglobin A1C:  Recent Labs  03/08/14 0944  HGBA1C 6.0*   Fasting Lipid Panel:  Recent Labs  03/09/14  CHOL 264*  HDL 60  LDLCALC 183*  TRIG 103  CHOLHDL 4.4    Radiology/Studies:  Pulmonary edema on chest x-ray post PCI, 03/08/2014  Physical Exam: Blood pressure 100/51, pulse 85, temperature 98.8 F (37.1 C), temperature source Oral, resp. rate 16, height 5\' 5"  (1.651 m), weight 160 lb 0.9 oz (72.6 kg), SpO2 98.00%. Weight change:   Wt Readings from Last 3 Encounters:  03/08/14 160 lb 0.9 oz (72.6 kg)  03/08/14 160 lb 0.9 oz (72.6 kg)  01/29/14 160 lb (72.576 kg)   GEN-  NAD OP claer Neck veins are flat. CV- A loud S4 gallop is heard. No rub or murmur. Lungs are clear Abdomen is soft. No edema is noted. Radial cath site is unremarkable.  ASSESSMENT/PLAN:  1. Proximal LAD total occlusion treated with bare-metal stent in setting of anterior ST elevation myocardial infarction 2. Severe residual coronary disease including distal left main and ostial circumflex/LAD 70-80% stenoses. Right coronary contains diffuse 50% proximal to mid narrowing. No recurrent angina since treatment of the proximal LAD in the acute setting.   3. Acute left heart failure (LVEF 25-30% by echo) with pulmonary edema responding to Lasix and percutaneous coronary intervention with LAD recanalization. Clinically improved.  Dr Tamala Julian recommends lifevest at discharge.  This is reasonable.  Will need to continue to titrate medical therapy as BP allows 4. Hypertension 5. Rash- will try to determine source, topical steroids for itching, benadryl prn  Transfer to telemetry today  Signed, Thompson Grayer 03/10/2014, 10:22 AM

## 2014-03-11 MED ORDER — LISINOPRIL 2.5 MG PO TABS
2.5000 mg | ORAL_TABLET | Freq: Every day | ORAL | Status: DC
  Filled 2014-03-11: qty 1

## 2014-03-11 NOTE — Progress Notes (Signed)
Pt bp low 80/40's  Asymptomatic, called Dr. Stanford Breed made him aware. Orders given to change lisinopril to 2.5mg . Monitoring will continue.

## 2014-03-11 NOTE — Progress Notes (Signed)
       Patient Name: Kelli Roy Date of Encounter: 03/11/2014    SUBJECTIVE: No chest pain or dyspnea; rash improving  TELEMETRY:  Normal sinus rhythm Filed Vitals:   03/10/14 1826 03/10/14 1827 03/10/14 1925 03/11/14 0557  BP: 103/47 92/49 100/63 104/58  Pulse:   87 84  Temp: 98.1 F (36.7 C)  98.3 F (36.8 C) 98.2 F (36.8 C)  TempSrc:   Oral Oral  Resp: 18  16 16   Height:      Weight:      SpO2: 100%  100% 94%    Intake/Output Summary (Last 24 hours) at 03/11/14 0931 Last data filed at 03/10/14 1700  Gross per 24 hour  Intake    635 ml  Output    650 ml  Net    -15 ml   LABS: Basic Metabolic Panel:  Recent Labs  03/08/14 0944 03/09/14  NA 143 139  K 4.4 4.1  CL 102 101  CO2 26 24  GLUCOSE 149* 155*  BUN 19 17  CREATININE 0.82 0.81  CALCIUM 10.2 9.5   CBC:  Recent Labs  03/08/14 0944 03/09/14  WBC 9.9 13.7*  HGB 13.7 13.9  HCT 42.1 41.2  MCV 95.2 92.4  PLT 204 226   Cardiac Enzymes:  Recent Labs  03/08/14 1700 03/08/14 2351 03/09/14 0620  TROPONINI >20.00* >20.00* >20.00*   BNP    Component Value Date/Time   PROBNP 112.8 03/07/2014 2057     Hemoglobin A1C:  Recent Labs  03/08/14 0944  HGBA1C 6.0*   Fasting Lipid Panel:  Recent Labs  03/09/14  CHOL 264*  HDL 60  LDLCALC 183*  TRIG 103  CHOLHDL 4.4    Radiology/Studies:  Pulmonary edema on chest x-ray post PCI, 03/08/2014  Physical Exam: Blood pressure 104/58, pulse 84, temperature 98.2 F (36.8 C), temperature source Oral, resp. rate 16, height 5\' 5"  (1.651 m), weight 160 lb 0.9 oz (72.6 kg), SpO2 94.00%. Weight change:   Wt Readings from Last 3 Encounters:  03/08/14 160 lb 0.9 oz (72.6 kg)  03/08/14 160 lb 0.9 oz (72.6 kg)  01/29/14 160 lb (72.576 kg)   GEN- NAD OP claer Neck veins are flat. CV- A loud S4 gallop is heard. No rub or murmur. Lungs are clear Abdomen is soft. No edema is noted. Radial cath site is unremarkable.  ASSESSMENT/PLAN:  1.  Proximal LAD total occlusion treated with bare-metal stent in setting of anterior ST elevation myocardial infarction; continue ASA, brilinta and statin. 2. Severe residual coronary disease including distal left main and ostial circumflex/LAD 70-80% stenoses. Right coronary contains diffuse 50% proximal to mid narrowing. No recurrent angina since treatment of the proximal LAD in the acute setting.  Patient states Dr Cyndia Bent has seen and plans CABG in 4 weeks 3. Acute left heart failure (LVEF 25-30% by echo)-ICM; continue ACEI and beta blocker. Lifevest at discharge. Previous echo - thrombus could not be excluded; will repeat with definity. 4. Hypertension 5. Rash- improving, following  DC tomorrow if life vest arranged and fu limited echo shows no thrombus.  Alex Gardener 03/11/2014, 9:31 AM

## 2014-03-12 ENCOUNTER — Encounter (HOSPITAL_COMMUNITY): Payer: Self-pay | Admitting: Nurse Practitioner

## 2014-03-12 DIAGNOSIS — E785 Hyperlipidemia, unspecified: Secondary | ICD-10-CM

## 2014-03-12 DIAGNOSIS — I255 Ischemic cardiomyopathy: Secondary | ICD-10-CM

## 2014-03-12 DIAGNOSIS — I219 Acute myocardial infarction, unspecified: Secondary | ICD-10-CM

## 2014-03-12 DIAGNOSIS — I2510 Atherosclerotic heart disease of native coronary artery without angina pectoris: Secondary | ICD-10-CM

## 2014-03-12 MED ORDER — TICAGRELOR 90 MG PO TABS: 90.0000 mg | ORAL_TABLET | Freq: Two times a day (BID) | ORAL | Status: DC

## 2014-03-12 MED ORDER — CARVEDILOL 3.125 MG PO TABS: 3.1250 mg | ORAL_TABLET | Freq: Two times a day (BID) | ORAL | Status: DC

## 2014-03-12 MED ORDER — NITROGLYCERIN 0.4 MG SL SUBL: 0.4000 mg | SUBLINGUAL_TABLET | SUBLINGUAL | Status: DC | PRN

## 2014-03-12 MED ORDER — PERFLUTREN LIPID MICROSPHERE
1.0000 mL | INTRAVENOUS | Status: AC | PRN
  Administered 2014-03-12: 2 mL via INTRAVENOUS
  Filled 2014-03-12: qty 10

## 2014-03-12 MED ORDER — ATORVASTATIN CALCIUM 80 MG PO TABS: 80.0000 mg | ORAL_TABLET | Freq: Every day | ORAL | Status: DC

## 2014-03-12 NOTE — Progress Notes (Addendum)
   Subjective: No CP  No SOB NO dizzienss Objective: Filed Vitals:   03/11/14 1032 03/11/14 1430 03/11/14 2122 03/12/14 0645  BP: 105/60 88/54 87/50  101/53  Pulse: 90 79 75 73  Temp:  97.6 F (36.4 C) 98.4 F (36.9 C) 98 F (36.7 C)  TempSrc:  Oral Oral Oral  Resp:   16 18  Height:      Weight:      SpO2:  97% 93% 95%   Weight change:   Intake/Output Summary (Last 24 hours) at 03/12/14 0754 Last data filed at 03/11/14 1700  Gross per 24 hour  Intake    240 ml  Output      0 ml  Net    240 ml    General: Alert, awake, oriented x3, in no acute distress Neck:  JVP is normal Heart: Regular rate and rhythm, without murmurs, rubs, gallops.  Lungs: Clear to auscultation.  No rales or wheezes. Exemities:  No edema.   Neuro: Grossly intact, nonfocal.   Lab Results: No results found for this or any previous visit (from the past 24 hour(s)).  Studies/Results: No results found.  Medications: Reviewed   @PROBHOSP @  1.  CAD  Patient s/p STEMI with prox LAD occlusion.  Received BMS .  Severe CAD elsewhere (LM, 70/80% ostial LCx/LAD; 50% RCA.  Plan for CABG in 4 wks.   Contine ASA/Brilinta/statin  2.  Acute systolic CHF Volume status looks good  Continue medical Rx.  Patient does note being on  Lisinopril in past  Had to stop due to cough  WIll stop  If BP improves Cozaar 12.5 mg  Cannot Rx 6.25 mg.   Echo to exclude thrombus.  Need to check on lifevest.  3.  HTN  BP low  Patient denies dizziness.  WIll need to follow    4.  HL  Keep on lipitor 80   Dorris Carnes 03/12/2014, 7:54 AM

## 2014-03-12 NOTE — Discharge Summary (Signed)
Discharge Summary   Patient ID: Kelli Roy,  MRN: [id number], DOB/AGE: January 10, 1946 68 y.o.  Admit date: 03/08/2014 Discharge date: 03/12/2014  Primary Care Provider: Tommy Medal Primary Cardiologist: P. Martinique, MD   Discharge Diagnoses Principal Problem:   ST elevation myocardial infarction (STEMI) involving left anterior descending (LAD) coronary artery with complication  **S/P PCI/BMS to the LAD this admission.  **Residual moderate LM, proximal LAD, and LCX disease.  **Consideration for CABG in the future.  Active Problems:   Acute systolic heart failure/Ischemic Cardiomyopathy  **EF 25-30% by echo this admission.  **Discharge weight 160 lbs.  **LifeVest placed @ discharge.   CAD (coronary artery disease), native coronary artery   Hypertension   Hyperlipidemia  Allergies Allergies  Allergen Reactions  . Demerol [Meperidine]     Nausea   . Dexilant [Dexlansoprazole]     Rash   . Meperidine Hcl   . Sulfonamide Derivatives     Rash   . Latex Rash   Procedures  Cardiac Catheterization and Percutaneous Coronary Intervention 9.3.2015  Hemodynamics: AO 90/53 mean 69 mm Hg LV 89/27 mm Hg              Coronary angiography: Coronary dominance: right  Left mainstem: There is 50-70% stenosis in the distal left main. Left anterior descending (LAD): 70-80% ostial LAD disease. The proximal LAD is 100% occluded.   **The proximal LAD was successfully stented using a 2.75 x 50mm Veriflex bare-metal stent.  Left circumflex (LCx): There is 50-70% ostial LCx disease. The proximal vessel has 40-50% stenosis. Right coronary artery (RCA): The RCA is a dominant vessel. There is diffuse 50% disease in the proximal to mid vessel.  Left ventriculography: Left ventricular systolic function is assessed post PCI. LVEF is estimated at 25-30%. There is akinesis of the Mid anterior wall and distal inferior wall. There is dyskinesis of the distal anterior wall and apex. There is moderate  MR. _____________   2D Echocardiogram 9.3.2015  Study Conclusions  - Left ventricle: The cavity size was normal. Systolic function was   severely reduced. The estimated ejection fraction was in the   range of 25% to 30%. There is akinesis of the   mid-apicalanteroseptal and inferior myocardium. Features are   consistent with a pseudonormal left ventricular filling pattern,   with concomitant abnormal relaxation and increased filling   pressure (grade 2 diastolic dysfunction). Cannot exclude apical   thrombus (consider Definity constrast study). - Mitral valve: There was mild regurgitation. _____________   Limited 2D Echocardiogram (to assess for apical thrombus) 9.7.2015  No evidence of LV thrombus. _____________   History of Present Illness  68 y/o female with a h/o HTN and hyperlipidemia, along with a family h/o CAD.  She was in her USOH until the day prior to admission, when she began to experience intermittent chest pain.  She presented to the ED where labs and ECG were within normal limits and she was discharged home.  Unfortunately, on the morning of 9/3, she developed recurrent, severe, substernal chest pain associated with nausea and diaphoresis.  She presented to the [facility] ED again and this time her ECG was notable for anterior ST elevation.  A Code STEMI was activated and she was taken to the cardiac catheterization laboratory for emergent evaluation.  Hospital Course  Emergent diagnostic catheterization was performed and revealed moderate distal left main/ostial LAD disease, along with an occluded proximal LAD.  She also had moderate LCX and RCA disease.  The LAD was felt to be the  culprit and was successfully treated with a 2.75 x 20 mm Veriflex bare-metal stent.  A BMS was intentionally chosen secondary to moderate multivessel coronary artery disease and the strong likelihood that she would require coronary artery bypass grafting in the near future.  EF was 25-30% by  ventriculography.  She tolerated the procedure well and post-procedure was monitored in the coronary intensive care unit.  There, she peaked her troponin @ > 20.  She also developed dyspnea and required IV lasix.  2D echocardiogram was performed in follow-up and corroborated ventriculography findings, with an EF of 25-30% with mid-apical anteroseptal and inferior akinesis.  An apical thrombus could not be ruled out and recommendation was made for a limited echo with contrast to better evaluate.  This has been performed this AM and did not show any evidence of an LV thrombus.  On 9/3, Kelli Roy c/o itching and rash.  A diffuse macular rash was noted and she was treated with oral benadryl and subsequently topical steroids with improvement in rash and symptoms.  It was unclear as to what was the cause of her allergic reaction.  She was placed on lisinopril in the setting of ICM, however later reported past-intolerance to it (cough) and it was thus discontinued.  She was also placed on ASA, brilinta, statin, and bb therapy.  Her blood pressure has been relatively low, and thus at this time, we are not able to place her on an ARB.  In the setting of bare-metal stent placement, she will require Brilinta for a minimum of 4 wks.  Kelli Roy has been ambulating without symptoms or limitations.  In the setting of newly diagnosed ischemic cardiomyopathy with severe LV dysfunction, decision was made to place a LifeVest prior to discharge.  This has been carried out this morning.  She will be discharged home today in good condition and we will arrange for early f/u within the next week.  As noted above, she will likely be referred for cardiothoracic surgery evaluation in the setting of multivessel CAD including the LM and ostial LAD.  Discharge Vitals Blood pressure 101/53, pulse 73, temperature 98 F (36.7 C), temperature source Oral, resp. rate 18, height 5\' 5"  (1.651 m), weight 160 lb 0.9 oz (72.6 kg), SpO2 95.00%.    Filed Weights   03/08/14 0950 03/08/14 1130  Weight: 160 lb (72.576 kg) 160 lb 0.9 oz (72.6 kg)   Labs  CBC Lab Results  Component Value Date   WBC 13.7* 03/09/2014   HGB 13.9 03/09/2014   HCT 41.2 03/09/2014   MCV 92.4 03/09/2014   PLT 226 03/12/6733    Basic Metabolic Panel Lab Results  Component Value Date   CREATININE 0.81 03/09/2014   BUN 17 03/09/2014   NA 139 03/09/2014   K 4.1 03/09/2014   CL 101 03/09/2014   CO2 24 03/09/2014    Liver Function Tests Lab Results  Component Value Date   ALT 15 03/08/2014   AST 20 03/08/2014   ALKPHOS 128* 03/08/2014   BILITOT 0.5 03/08/2014    Cardiac Enzymes Lab Results  Component Value Date   TROPONINI >20.00* 03/09/2014   Hemoglobin A1C Lab Results  Component Value Date   HGBA1C 6.0* 03/08/2014   Fasting Lipid Panel Lab Results  Component Value Date   CHOL 264* 03/09/2014   HDL 60 03/09/2014   LDLCALC 183* 03/09/2014   TRIG 103 03/09/2014   CHOLHDL 4.4 03/09/2014     Disposition  Pt is being discharged home today  in good condition.  Follow-up Plans & Appointments  Follow-up Information   Follow up with Peter Martinique, MD. (We will arrange f/u and contact you.)    Specialty:  Cardiology   Contact information:   [address] [place] [address] [phone]       Follow up with Tommy Medal, MD. (as scheduled.)    Specialty:  Internal Medicine   Contact information:   [address] [place] [address] [phone]       Discharge Medications    Medication List    STOP taking these medications       amLODipine 5 MG tablet  Commonly known as:  NORVASC     irbesartan 300 MG tablet  Commonly known as:  AVAPRO      TAKE these medications       ALIVE WOMENS ENERGY PO  Take 1 tablet by mouth daily.     aspirin EC 81 MG tablet  Take 81 mg by mouth daily.     atorvastatin 80 MG tablet  Commonly known as:  LIPITOR  Take 1 tablet (80 mg total) by mouth daily at 6 PM.     CALTRATE 600+D  600-400 MG-UNIT per tablet  Generic drug:  Calcium Carbonate-Vitamin D  Take 1 tablet by mouth daily.     carvedilol 3.125 MG tablet  Commonly known as:  COREG  Take 1 tablet (3.125 mg total) by mouth 2 (two) times daily with a meal.     eszopiclone 1 MG Tabs tablet  Commonly known as:  LUNESTA  Take 0.5 mg by mouth at bedtime as needed for sleep. Take immediately before bedtime     Fish Oil 1200 MG Caps  Take 2 capsules by mouth daily.     GLUCOSAMINE CHONDR 1500 COMPLX PO  Take 2 tablets by mouth daily.     Magnesium 400 MG Caps  Take 400 mg by mouth daily.     ranitidine 150 MG tablet  Commonly known as:  ZANTAC  Take 1 tablet (150 mg total) by mouth 2 (two) times daily.     ticagrelor 90 MG Tabs tablet  Commonly known as:  BRILINTA  Take 1 tablet (90 mg total) by mouth 2 (two) times daily.     vitamin B-12 1000 MCG tablet  Commonly known as:  CYANOCOBALAMIN  Take 1,000 mcg by mouth daily.     Vitamin D 2000 UNITS tablet  Take 2,000 Units by mouth daily.       Outstanding Labs/Studies  Lipids and LFT's in 8 wks.  Duration of Discharge Encounter   Greater than 30 minutes including physician time.  SignedMurray Hodgkins NP 03/12/2014, 12:06 PM

## 2014-03-12 NOTE — Progress Notes (Addendum)
Echo Lab Limited Echocardiogram with Definity completed.  [place], RDCS 03/12/2014 11:24 AM

## 2014-03-12 NOTE — Discharge Summary (Signed)
Agree with plans for D/C.

## 2014-03-12 NOTE — Discharge Instructions (Signed)
**  PLEASE REMEMBER TO BRING ALL OF YOUR MEDICATIONS TO EACH OF YOUR FOLLOW-UP OFFICE VISITS. ° °NO HEAVY LIFTING X 4 WEEKS. °NO SEXUAL ACTIVITY X 4 WEEKS. °NO DRIVING X 2 WEEKS. °NO SOAKING BATHS, HOT TUBS, POOLS, ETC., X 7 DAYS. ° °Radial Site Care °Refer to this sheet in the next few weeks. These instructions provide you with information on caring for yourself after your procedure. Your caregiver may also give you more specific instructions. Your treatment has been planned according to current medical practices, but problems sometimes occur. Call your caregiver if you have any problems or questions after your procedure. °HOME CARE INSTRUCTIONS °· You may shower the day after the procedure. Remove the bandage (dressing) and gently wash the site with plain soap and water. Gently pat the site dry.  °· Do not apply powder or lotion to the site.  °· Do not submerge the affected site in water for 3 to 5 days.  °· Inspect the site at least twice daily.  °· Do not flex or bend the affected arm for 24 hours.  °· No lifting over 5 pounds (2.3 kg) for 5 days after your procedure.  °· Do not drive home if you are discharged the same day of the procedure. Have someone else drive you.  ° °What to expect: °· Any bruising will usually fade within 1 to 2 weeks.  °· Blood that collects in the tissue (hematoma) may be painful to the touch. It should usually decrease in size and tenderness within 1 to 2 weeks.  °SEEK IMMEDIATE MEDICAL CARE IF: °· You have unusual pain at the radial site.  °· You have redness, warmth, swelling, or pain at the radial site.  °· You have drainage (other than a small amount of blood on the dressing).  °· You have chills.  °· You have a fever or persistent symptoms for more than 72 hours.  °· You have a fever and your symptoms suddenly get worse.  °· Your arm becomes pale, cool, tingly, or numb.  °· You have heavy bleeding from the site. Hold pressure on the site.  ° °

## 2014-03-13 ENCOUNTER — Encounter: Payer: Self-pay | Admitting: Nurse Practitioner

## 2014-03-13 ENCOUNTER — Ambulatory Visit (INDEPENDENT_AMBULATORY_CARE_PROVIDER_SITE_OTHER): Payer: Medicare Other | Admitting: Nurse Practitioner

## 2014-03-13 ENCOUNTER — Telehealth: Payer: Self-pay | Admitting: *Deleted

## 2014-03-13 VITALS — BP 118/70 | HR 84 | Ht 65.0 in | Wt 158.0 lb

## 2014-03-13 DIAGNOSIS — I219 Acute myocardial infarction, unspecified: Secondary | ICD-10-CM

## 2014-03-13 DIAGNOSIS — I10 Essential (primary) hypertension: Secondary | ICD-10-CM

## 2014-03-13 DIAGNOSIS — E785 Hyperlipidemia, unspecified: Secondary | ICD-10-CM

## 2014-03-13 DIAGNOSIS — I213 ST elevation (STEMI) myocardial infarction of unspecified site: Secondary | ICD-10-CM

## 2014-03-13 NOTE — Telephone Encounter (Signed)
S/w pt will come in today for appointment

## 2014-03-13 NOTE — Patient Instructions (Addendum)
Stay on your current medicines  Monitor your blood pressure at home - hopefully we will add additional medicine when you come back  I will see you this Friday at 2:30pm  I have called Dr. Cyndia Bent and his office will be in touch - "TCTS"  Call the [place] office at [phone] if you have any questions, problems or concerns.

## 2014-03-13 NOTE — Progress Notes (Signed)
Kelli Roy Date of Birth: [birth year] Medical Record #[id number]  History of Present Illness: Kelli Roy is seen back today for a post hospital visit. Seen for Dr. Martinique.. She has HTN, HLD and family history of CAD.   Recently admitted with chest pain. Had emergent cath showing moderate distal left main/ostial LAD disease along with an occluded proximal LAD. Also with moderate LCX and RCA disease. BMS to the LAD was performed. BMS used due to strong likelihood of needing CABG with her moderate multivessel disease. EF was 25 to 30% by echo and was concerning for an apical thrombus however repeat limited echo at discharge with contrast did not show any evidence of LV thrombus. Troponin peaked over 20. She was discharged yesterday on Brilinta for a minimum of 4 weeks with ASA, statin and BB therapy. Her course was complicated by a rash - not clear as to the etiology - she was on ACE and has had past cough - thus her ACE was stopped. BP little soft so ARB was not started. She does have a LifeVest in place.   Comes in today. Here with her husband today. Just discharged and has barely been home 24 hours. Little anxious. No chest pain but has had some indigestion off and on - nothing like her prior chest pain syndrome. BP running 110 at home. Not dizzy. Rash still an issue. Has her LifeVest in place. Understands the need for daily weights, sodium restriction, etc.   Current Outpatient Prescriptions  Medication Sig Dispense Refill  . aspirin EC 81 MG tablet Take 81 mg by mouth daily.      Marland Kitchen atorvastatin (LIPITOR) 80 MG tablet Take 1 tablet (80 mg total) by mouth daily at 6 PM.  30 tablet  6  . carvedilol (COREG) 3.125 MG tablet Take 1 tablet (3.125 mg total) by mouth 2 (two) times daily with a meal.  60 tablet  6  . nitroGLYCERIN (NITROSTAT) 0.4 MG SL tablet Place 1 tablet (0.4 mg total) under the tongue every 5 (five) minutes as needed for chest pain.  25 tablet  3  . ticagrelor (BRILINTA) 90 MG TABS  tablet Take 1 tablet (90 mg total) by mouth 2 (two) times daily.  60 tablet  2   No current facility-administered medications for this visit.    Allergies  Allergen Reactions  . Demerol [Meperidine]     Nausea   . Dexilant [Dexlansoprazole]     Rash   . Meperidine Hcl   . Sulfonamide Derivatives     Rash   . Latex Rash    Past Medical History  Diagnosis Date  . Hypertension   . Hyperlipidemia   . Malaise and fatigue   . Insomnia   . Family history of colon cancer   . Family history of cardiovascular disease   . History of long-term treatment with high-risk medication   . Sinus headache   . Alopecia   . CAD (coronary artery disease)     a. 03/2014 Ant STEMI/PCI: LM 50-70d, LAD 70-80ost, 100p (2.75x20 Veriflex BMS), LCX 50-70ost, 40-50p, RCA dom - 50p/m, EF 25-30%, mid ant/dist inf AK, dist ant/apical DK, mod MR.  . Ischemic cardiomyopathy     a. 03/2014 Echo: EF 25-30%, mid-apical/anteroseptal and inf AK, Gr 2 DD, ? apical thrombus, Mild MR.-->Discharged with LifeVest.  . Chronic systolic CHF (congestive heart failure)     a. 03/2014 Echo: EF 25-30%.    Past Surgical History  Procedure Laterality Date  .  Cardiac catheterization  2001    showed no blockages  . Cystectomy  1998    fibroid cysts  . 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 Cervical spine surgery  09/27/2003    C6-67 servical fusion  . Dilation and curettage of uterus  1976    2nd to miscarriage  . Cataract extraction w/phaco Left 01/04/2014    Procedure: CATARACT EXTRACTION PHACO AND INTRAOCULAR LENS PLACEMENT (IOC);  Surgeon: Tonny Branch, MD;  Location: [facility];  Service: Ophthalmology;  Laterality: Left;  CDE:5.65  . Cataract extraction w/phaco Right 01/29/2014    Procedure: CATARACT EXTRACTION PHACO AND INTRAOCULAR LENS PLACEMENT RIGHT EYE CDE=4.49;  Surgeon: Tonny Branch, MD;  Location: [facility];  Service: Ophthalmology;  Laterality: Right;    History  Smoking status    . Never Smoker   Smokeless tobacco  . Never Used    History  Alcohol Use  . Yes    Comment: rare glass of wine    Family History  Problem Relation Age of Onset  . Stroke Mother   . Heart attack Mother   . Peripheral vascular disease Father     had stent in leg  . Colon cancer Father   . Atrial fibrillation Father     with pacemaker  . Thyroid disease Father   . Pancreatic cancer Brother   . Coronary artery disease Brother     with Stent  . Coronary artery disease Brother     with CABG in his 71's  . Thyroid disease Brother   . Thyroid disease Sister     Review of Systems: The review of systems is per the HPI.  All other systems were reviewed and are negative.  Physical Exam: BP 118/70  Pulse 84  Ht 5\' 5"  (1.651 m)  Wt 158 lb (71.668 kg)  BMI 26.29 kg/m2 Patient is very pleasant and in no acute distress. Skin is warm and dry. Color is normal.  HEENT is unremarkable. Normocephalic/atraumatic. PERRL. Sclera are nonicteric. Neck is supple. No masses. No JVD. Lungs are clear. Cardiac exam shows a regular rate and rhythm. Abdomen is soft. Extremities are without edema. Gait and ROM are intact. No gross neurologic deficits noted.  Wt Readings from Last 3 Encounters:  03/13/14 158 lb (71.668 kg)  03/08/14 160 lb 0.9 oz (72.6 kg)  03/08/14 160 lb 0.9 oz (72.6 kg)    LABORATORY DATA/PROCEDURES:  Lab Results  Component Value Date   WBC 13.7* 03/09/2014   HGB 13.9 03/09/2014   HCT 41.2 03/09/2014   PLT 226 03/09/2014   GLUCOSE 155* 03/09/2014   CHOL 264* 03/09/2014   TRIG 103 03/09/2014   HDL 60 03/09/2014   LDLCALC 183* 03/09/2014   ALT 15 03/08/2014   AST 20 03/08/2014   NA 139 03/09/2014   K 4.1 03/09/2014   CL 101 03/09/2014   CREATININE 0.81 03/09/2014   BUN 17 03/09/2014   CO2 24 03/09/2014   INR 0.98 03/08/2014   HGBA1C 6.0* 03/08/2014    BNP (last 3 results)  Recent Labs  03/07/14 2057  PROBNP 112.8     Assessment / Plan: 1. Recent anterior MI with BMS to the LAD with  residual CAD - felt to need CABG  2. Ischemic CM - LifeVest in place. EF of 25%. On low dose beta blocker. BP still a little soft. Would like to see her back on Friday - hope to start ARB and  then see weekly to titrate her medicines.   3. HLD - on Lipitor.   4. Rash - not clear as to the etiology. Ok to continue to use Benadryl.   See back on Friday. Check labs on Friday. Add ARB hopefully. Referral to Dr. Cyndia Bent arranged.   Patient is agreeable to this plan and will call if any problems develop in the interim.   Burtis Junes, RN, [place] [address] [place], [place]  [address] [phone]

## 2014-03-16 ENCOUNTER — Ambulatory Visit (INDEPENDENT_AMBULATORY_CARE_PROVIDER_SITE_OTHER): Payer: Medicare Other | Admitting: Nurse Practitioner

## 2014-03-16 ENCOUNTER — Encounter: Payer: Self-pay | Admitting: Nurse Practitioner

## 2014-03-16 VITALS — BP 100/70 | HR 68 | Ht 65.5 in | Wt 158.0 lb

## 2014-03-16 DIAGNOSIS — I2109 ST elevation (STEMI) myocardial infarction involving other coronary artery of anterior wall: Secondary | ICD-10-CM

## 2014-03-16 DIAGNOSIS — I5022 Chronic systolic (congestive) heart failure: Secondary | ICD-10-CM | POA: Diagnosis not present

## 2014-03-16 LAB — BASIC METABOLIC PANEL
BUN: 13 mg/dL (ref 6–23)
CO2: 27 mEq/L (ref 19–32)
Calcium: 9.3 mg/dL (ref 8.4–10.5)
Chloride: 102 mEq/L (ref 96–112)
Creatinine, Ser: 0.7 mg/dL (ref 0.4–1.2)
GFR: 84 mL/min (ref >60.00)
Glucose, Bld: 92 mg/dL (ref 70–99)
Potassium: 4.1 mEq/L (ref 3.5–5.1)
Sodium: 136 mEq/L (ref 135–145)

## 2014-03-16 LAB — CBC
HCT: 37.6 % (ref 36.0–46.0)
Hemoglobin: 12.8 g/dL (ref 12.0–15.0)
MCHC: 34.1 g/dL (ref 30.0–36.0)
MCV: 92.5 fl (ref 78.0–100.0)
Platelets: 214 10*3/uL (ref 150.0–400.0)
RBC: 4.07 Mil/uL (ref 3.87–5.11)
RDW: 13.4 % (ref 11.5–15.5)
WBC: 8.4 10*3/uL (ref 4.0–10.5)

## 2014-03-16 MED ORDER — LOSARTAN POTASSIUM 25 MG PO TABS: 12.5000 mg | ORAL_TABLET | Freq: Every day | ORAL | Status: DC

## 2014-03-16 MED ORDER — ISOSORBIDE MONONITRATE ER 30 MG PO TB24: 15.0000 mg | ORAL_TABLET | Freq: Every day | ORAL | Status: DC

## 2014-03-16 MED ORDER — LOSARTAN POTASSIUM 25 MG PO TABS: 25.0000 mg | ORAL_TABLET | Freq: Every day | ORAL | Status: DC

## 2014-03-16 NOTE — Patient Instructions (Addendum)
We will check labs today  I am starting low dose Losartan 25 mg take just 1/2 tablet at night (12.5mg )  I am starting low dose Imdur 30 mg to take only 1/2 tablet daily (15mg )  Monitor your blood pressure at home  I will see you in a week  Call the [place] office at [phone] if you have any questions, problems or concerns.

## 2014-03-16 NOTE — Progress Notes (Signed)
Kelli Roy Date of Birth: 1945/07/12 Medical Record #[id number]  History of Present Illness: Ms. Kelli Roy is seen back today for a 3 day visit. Seen for Dr. Martinique.. She has HTN, HLD and family history of CAD.   Recently admitted with chest pain. Had emergent cath showing moderate distal left main/ostial LAD disease along with an occluded proximal LAD. Also with moderate LCX and RCA disease. BMS to the LAD was performed. BMS used due to strong likelihood of needing CABG with her moderate multivessel disease. EF was 25 to 30% by echo and was concerning for an apical thrombus however repeat limited echo at discharge with contrast did not show any evidence of LV thrombus. Troponin peaked over 20. She was discharged yesterday on Brilinta for a minimum of 4 weeks with ASA, statin and BB therapy. Her course was complicated by a rash - not clear as to the etiology - she was on ACE and has had past cough - thus her ACE was stopped. BP little soft so ARB was not started. She does have a LifeVest in place.   I saw her on Tuesday of this week - had not even been home barely 24 hours - was doing ok. I asked her to come back today to reassess her BP and see if we could start ARB therapy. I also referred her to Dr. Cyndia Bent for CABG. Has her LifeVest in place.  Comes in today. Here with a friend today. Doing ok. Has done better as the week has progressed but a little discomfort earlier today - fleeting - happened after eating. She is not doing much in the way of activity. Not driving. Life Vest ok. BP about 110 at home. She has not been dizzy at all. Sees Dr. Cyndia Bent next Wednesday. Lots of questions about activity, diet, etc. Weight stable. No swelling. No shortness of breath.   Current Outpatient Prescriptions  Medication Sig Dispense Refill  . aspirin EC 81 MG tablet Take 81 mg by mouth daily.      Marland Kitchen atorvastatin (LIPITOR) 80 MG tablet Take 1 tablet (80 mg total) by mouth daily at 6 PM.  30 tablet  6  .  carvedilol (COREG) 3.125 MG tablet Take 1 tablet (3.125 mg total) by mouth 2 (two) times daily with a meal.  60 tablet  6  . nitroGLYCERIN (NITROSTAT) 0.4 MG SL tablet Place 1 tablet (0.4 mg total) under the tongue every 5 (five) minutes as needed for chest pain.  25 tablet  3  . ticagrelor (BRILINTA) 90 MG TABS tablet Take 1 tablet (90 mg total) by mouth 2 (two) times daily.  60 tablet  2   No current facility-administered medications for this visit.    Allergies  Allergen Reactions  . Demerol [Meperidine]     Nausea   . Dexilant [Dexlansoprazole]     Rash   . Lisinopril     cough  . Meperidine Hcl   . Sulfonamide Derivatives     Rash   . Latex Rash    Past Medical History  Diagnosis Date  . Hypertension   . Hyperlipidemia   . Malaise and fatigue   . Insomnia   . Family history of colon cancer   . Family history of cardiovascular disease   . History of long-term treatment with high-risk medication   . Sinus headache   . Alopecia   . CAD (coronary artery disease)     a. 03/2014 Ant STEMI/PCI: LM 50-70d, LAD 70-80ost, 100p (  2.75x20 Veriflex BMS), LCX 50-70ost, 40-50p, RCA dom - 50p/m, EF 25-30%, mid ant/dist inf AK, dist ant/apical DK, mod MR.  . Ischemic cardiomyopathy     a. 03/2014 Echo: EF 25-30%, mid-apical/anteroseptal and inf AK, Gr 2 DD, ? apical thrombus, Mild MR.-->Discharged with LifeVest.  . Chronic systolic CHF (congestive heart failure)     a. 03/2014 Echo: EF 25-30%.    Past Surgical History  Procedure Laterality Date  . Cardiac catheterization  2001    showed no blockages  . Cystectomy  1998    fibroid cysts  . 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 Cervical spine surgery  09/27/2003    C6-67 servical fusion  . Dilation and curettage of uterus  1976    2nd to miscarriage  . Cataract extraction w/phaco Left 01/04/2014    Procedure: CATARACT EXTRACTION PHACO AND INTRAOCULAR LENS PLACEMENT (IOC);  Surgeon: Tonny Branch, MD;  Location: [facility];  Service: Ophthalmology;  Laterality: Left;  CDE:5.65  . Cataract extraction w/phaco Right 01/29/2014    Procedure: CATARACT EXTRACTION PHACO AND INTRAOCULAR LENS PLACEMENT RIGHT EYE CDE=4.49;  Surgeon: Tonny Branch, MD;  Location: [facility];  Service: Ophthalmology;  Laterality: Right;    History  Smoking status  . Never Smoker   Smokeless tobacco  . Never Used    History  Alcohol Use  . Yes    Comment: rare glass of wine    Family History  Problem Relation Age of Onset  . Stroke Mother   . Heart attack Mother   . Peripheral vascular disease Father     had stent in leg  . Colon cancer Father   . Atrial fibrillation Father     with pacemaker  . Thyroid disease Father   . Pancreatic cancer Brother   . Coronary artery disease Brother     with Stent  . Coronary artery disease Brother     with CABG in his 63's  . Thyroid disease Brother   . Thyroid disease Sister     Review of Systems: The review of systems is per the HPI.  All other systems were reviewed and are negative.  Physical Exam: BP 100/70  Pulse 68  Ht 5' 5.5" (1.664 m)  Wt 158 lb (71.668 kg)  BMI 25.88 kg/m2 Patient is very pleasant and in no acute distress. Skin is warm and dry. Color is normal.  HEENT is unremarkable. Normocephalic/atraumatic. PERRL. Sclera are nonicteric. Neck is supple. No masses. No JVD. Lungs are clear. Cardiac exam shows a regular rate and rhythm. Abdomen is soft. Extremities are without edema. Gait and ROM are intact. No gross neurologic deficits noted.  Wt Readings from Last 3 Encounters:  03/16/14 158 lb (71.668 kg)  03/13/14 158 lb (71.668 kg)  03/08/14 160 lb 0.9 oz (72.6 kg)    LABORATORY DATA/PROCEDURES: PENDING  Lab Results  Component Value Date   WBC 13.7* 03/09/2014   HGB 13.9 03/09/2014   HCT 41.2 03/09/2014   PLT 226 03/09/2014   GLUCOSE 155* 03/09/2014   CHOL 264* 03/09/2014   TRIG 103 03/09/2014   HDL 60 03/09/2014   LDLCALC 183* 03/09/2014   ALT 15  03/08/2014   AST 20 03/08/2014   NA 139 03/09/2014   K 4.1 03/09/2014   CL 101 03/09/2014   CREATININE 0.81 03/09/2014   BUN 17 03/09/2014   CO2 24 03/09/2014   INR 0.98 03/08/2014   HGBA1C 6.0* 03/08/2014  BNP (last 3 results)  Recent Labs  03/07/14 2057  PROBNP 112.8   Procedure: Left Heart Cath, Selective Coronary Angiography, LV angiography, PTCA and stenting of the proximal LAD  Indication: 68 yo WF with history of HTN, HL, and family history of CAD presents with an anterior STEMI.  Procedural Details: The right wrist was prepped, draped, and anesthetized with 1% lidocaine. Using the modified Seldinger technique, a 6 French slender sheath was introduced into the right radial artery. 3 mg of verapamil was administered through the sheath, weight-based unfractionated heparin was administered intravenously. Standard Judkins catheters were used for selective coronary angiography and left ventriculography. Catheter exchanges were performed over an exchange length guidewire.  PROCEDURAL FINDINGS  Hemodynamics:  AO 90/53 mean 69 mm Hg  LV 89/27 mm Hg  Coronary angiography:  Coronary dominance: right  Left mainstem: There is 50-70% stenosis in the distal left main.  Left anterior descending (LAD): 70-80% ostial LAD disease. The proximal LAD is 100% occluded.  Left circumflex (LCx): There is 50-70% ostial LCx disease. The proximal vessel has 40-50% stenosis.  Right coronary artery (RCA): The RCA is a dominant vessel. There is diffuse 50% disease in the proximal to mid vessel.  Left ventriculography: Left ventricular systolic function is assessed post PCI. LVEF is estimated at 25-30%. There is akinesis of the Mid anterior wall and distal inferior wall. There is dyskinesis of the distal anterior wall and apex. There is moderate MR.  PCI Note: Following the diagnostic procedure, the decision was made to proceed with PCI. Weight-based bivalirudin was given for anticoagulation. Brilinta 180 mg was given orally.  Once a therapeutic ACT was achieved, a 6 [place] XBLAD 3.5 guide catheter was inserted. A prowater coronary guidewire was used to cross the lesion. The lesion was predilated with a 2.5 mm balloon. The lesion was then stented with a 2.75 x 20 mm Veriflex stent. The stent was postdilated with a 3.0 mm noncompliant balloon. Following PCI, there was 0% residual stenosis and TIMI-3 flow. Final angiography confirmed an excellent result. The patient tolerated the procedure well and was pain free at the end of procedure. There were no immediate procedural complications. A TR band was used for radial hemostasis. The patient was transferred to the post catheterization recovery area for further monitoring.  PCI Data:  Vessel - LAD/Segment - proximal  Percent Stenosis (pre) 100%  TIMI-flow 0  Stent 2.75 x 20 mm Veriflex  Percent Stenosis (post) 0%  TIMI-flow (post) 3  Final Conclusions:  1. 3 vessel and left main CAD. Culprit is occlusion of the proximal LAD.  2. Severe LV dysfunction.  3. Elevated LVEDP  4. Successful stenting of the proximal LAD with a BMS.  Recommendations:  Continue DAPT. Will transfer to ICU for further management. Echo ordered. Hold beta blocker and ACEi for now due to low BP. I think ultimately the patient will need to be considered for CABG for management of Left main and ostial LAD/LCx disease. For this reason a BMS was used.  Peter Martinique, [place]  03/08/2014, 11:08 AM     Echo Study Conclusions from September 2015  - Left ventricle: Limited study with contrast. LVEF is severely depressed 20 to 25% with akinesis of septum, distal lateral, anterior, inferior and apical walls. No thrombus.   Assessment / Plan:  1. Recent anterior MI with BMS to the LAD with residual CAD - felt to need CABG - already referred to Dr. Cyndia Bent -  Sees him next week. Will add very  low dose Imdur for this "indigestion/chest discomfort". See back next Wednesday  2. Ischemic CM - LifeVest in place. EF  of 25%. On low dose beta blocker. Will try to add low dose ARB.   3. HLD - on Lipitor.   4. Rash - not clear as to the etiology. Ok to continue to use Benadryl. Still not totally resolved.   I will see her next Wednesday as well. Check baseline labs today. Really want her to keep avoiding strenuous activities until we get her totally revascularized.   Patient is agreeable to this plan and will call if any problems develop in the interim.   Burtis Junes, RN, [place] [address] [place], [place]  [address] [phone]

## 2014-03-19 NOTE — Progress Notes (Signed)
We will discuss on Wednesday.

## 2014-03-21 ENCOUNTER — Encounter: Payer: Self-pay | Admitting: Nurse Practitioner

## 2014-03-21 ENCOUNTER — Encounter: Payer: Self-pay | Admitting: Surgery

## 2014-03-21 ENCOUNTER — Ambulatory Visit (INDEPENDENT_AMBULATORY_CARE_PROVIDER_SITE_OTHER): Payer: Medicare Other | Admitting: Nurse Practitioner

## 2014-03-21 ENCOUNTER — Institutional Professional Consult (permissible substitution) (INDEPENDENT_AMBULATORY_CARE_PROVIDER_SITE_OTHER): Payer: Medicare Other | Admitting: Surgery

## 2014-03-21 VITALS — BP 116/72 | HR 70 | Ht 65.5 in | Wt 157.0 lb

## 2014-03-21 VITALS — BP 112/64 | HR 67 | Ht 65.5 in | Wt 157.2 lb

## 2014-03-21 DIAGNOSIS — I209 Angina pectoris, unspecified: Secondary | ICD-10-CM

## 2014-03-21 DIAGNOSIS — I2109 ST elevation (STEMI) myocardial infarction involving other coronary artery of anterior wall: Secondary | ICD-10-CM

## 2014-03-21 DIAGNOSIS — E785 Hyperlipidemia, unspecified: Secondary | ICD-10-CM

## 2014-03-21 DIAGNOSIS — I2102 ST elevation (STEMI) myocardial infarction involving left anterior descending coronary artery: Secondary | ICD-10-CM

## 2014-03-21 DIAGNOSIS — I10 Essential (primary) hypertension: Secondary | ICD-10-CM

## 2014-03-21 DIAGNOSIS — I2510 Atherosclerotic heart disease of native coronary artery without angina pectoris: Secondary | ICD-10-CM

## 2014-03-21 DIAGNOSIS — I5022 Chronic systolic (congestive) heart failure: Secondary | ICD-10-CM | POA: Diagnosis not present

## 2014-03-21 DIAGNOSIS — I25119 Atherosclerotic heart disease of native coronary artery with unspecified angina pectoris: Secondary | ICD-10-CM

## 2014-03-21 NOTE — Patient Instructions (Signed)
Stay on your current medicines  Call me on Friday and let me know "what the plan is" and then we will decide when to see  Ok to use benadryl and cortisone   Call the [place] office at [phone] if you have any questions, problems or concerns.

## 2014-03-21 NOTE — Progress Notes (Signed)
Kelli Roy Date of Birth: 12-Dec-1945 Medical Record #[id number]  History of Present Illness: Kelli Roy is seen back today for a 5 day check visit. Seen for Dr. Martinique. She has HTN, HLD and family history of CAD.   Recently admitted in early September with chest pain. Had emergent cath showing moderate distal left main/ostial LAD disease along with an occluded proximal LAD. Also with moderate LCX and RCA disease. BMS to the LAD was performed. BMS used due to strong likelihood of needing CABG with her moderate multivessel disease. EF was 25 to 30% by echo and was concerning for an apical thrombus however repeat limited echo at discharge with contrast did not show any evidence of LV thrombus. Troponin peaked over 20. She was discharged on September 6th on Brilinta for a minimum of 4 weeks with ASA, statin and BB therapy. Her course was complicated by a rash - not clear as to the etiology - she was on ACE and has had past cough - thus her ACE was stopped. BP little soft so ARB was not started. She does have a LifeVest in place.  I saw her the following day and then 4 days later. Have added low dose Imdur and low dose ARB to her regimen - she had been on losartan in the past. Referred to Dr. Cyndia Bent as well.   Comes in today. Here with her husband today. She is doing ok. Not dizzy or lightheaded. Ok on her medicines. Had a little headache but this has resolved. No chest pain. BP running 893'Y systolic at home. She is walking for about 7 minutes a day and feels ok with this. Seeing Dr. Cyndia Bent later today. Still with some intermittent rash. Still with diffuse itching. She has had longstanding dermatology issues - she has alopecia - really never diagnosed as why. She is worried about not being able to use her turbine after surgery.    Current Outpatient Prescriptions  Medication Sig Dispense Refill  . aspirin EC 81 MG tablet Take 81 mg by mouth daily.      Marland Kitchen atorvastatin (LIPITOR) 80 MG tablet Take 1  tablet (80 mg total) by mouth daily at 6 PM.  30 tablet  6  . carvedilol (COREG) 3.125 MG tablet Take 1 tablet (3.125 mg total) by mouth 2 (two) times daily with a meal.  60 tablet  6  . isosorbide mononitrate (IMDUR) 30 MG 24 hr tablet Take 0.5 tablets (15 mg total) by mouth daily.  90 tablet  3  . losartan (COZAAR) 25 MG tablet Take 0.5 tablets (12.5 mg total) by mouth daily.  30 tablet  3  . nitroGLYCERIN (NITROSTAT) 0.4 MG SL tablet Place 1 tablet (0.4 mg total) under the tongue every 5 (five) minutes as needed for chest pain.  25 tablet  3  . ticagrelor (BRILINTA) 90 MG TABS tablet Take 1 tablet (90 mg total) by mouth 2 (two) times daily.  60 tablet  2   No current facility-administered medications for this visit.    Allergies  Allergen Reactions  . Demerol [Meperidine]     Nausea   . Dexilant [Dexlansoprazole]     Rash   . Lisinopril     cough  . Meperidine Hcl   . Sulfonamide Derivatives     Rash   . Latex Rash    Past Medical History  Diagnosis Date  . Hypertension   . Hyperlipidemia   . Malaise and fatigue   . Insomnia   .  Family history of colon cancer   . Family history of cardiovascular disease   . History of long-term treatment with high-risk medication   . Sinus headache   . Alopecia   . CAD (coronary artery disease)     a. 03/2014 Ant STEMI/PCI: LM 50-70d, LAD 70-80ost, 100p (2.75x20 Veriflex BMS), LCX 50-70ost, 40-50p, RCA dom - 50p/m, EF 25-30%, mid ant/dist inf AK, dist ant/apical DK, mod MR.  . Ischemic cardiomyopathy     a. 03/2014 Echo: EF 25-30%, mid-apical/anteroseptal and inf AK, Gr 2 DD, ? apical thrombus, Mild MR.-->Discharged with LifeVest.  . Chronic systolic CHF (congestive heart failure)     a. 03/2014 Echo: EF 25-30%.    Past Surgical History  Procedure Laterality Date  . Cardiac catheterization  2001    showed no blockages  . Cystectomy  1998    fibroid cysts  . 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 Cervical spine surgery  09/27/2003    C6-67 servical fusion  . Dilation and curettage of uterus  1976    2nd to miscarriage  . Cataract extraction w/phaco Left 01/04/2014    Procedure: CATARACT EXTRACTION PHACO AND INTRAOCULAR LENS PLACEMENT (IOC);  Surgeon: Tonny Branch, MD;  Location: [facility];  Service: Ophthalmology;  Laterality: Left;  CDE:5.65  . Cataract extraction w/phaco Right 01/29/2014    Procedure: CATARACT EXTRACTION PHACO AND INTRAOCULAR LENS PLACEMENT RIGHT EYE CDE=4.49;  Surgeon: Tonny Branch, MD;  Location: [facility];  Service: Ophthalmology;  Laterality: Right;    History  Smoking status  . Never Smoker   Smokeless tobacco  . Never Used    History  Alcohol Use  . Yes    Comment: rare glass of wine    Family History  Problem Relation Age of Onset  . Stroke Mother   . Heart attack Mother   . Peripheral vascular disease Father     had stent in leg  . Colon cancer Father   . Atrial fibrillation Father     with pacemaker  . Thyroid disease Father   . Pancreatic cancer Brother   . Coronary artery disease Brother     with Stent  . Coronary artery disease Brother     with CABG in his 42's  . Thyroid disease Brother   . Thyroid disease Sister     Review of Systems: The review of systems is per the HPI.  All other systems were reviewed and are negative.  Physical Exam: BP 112/64  Pulse 67  Ht 5' 5.5" (1.664 m)  Wt 157 lb 3.2 oz (71.305 kg)  BMI 25.75 kg/m2  SpO2 97% Patient is very pleasant and in no acute distress. Skin is warm and dry. Color is normal.  HEENT is unremarkable. Normocephalic/atraumatic. PERRL. Sclera are nonicteric. Neck is supple. No masses. No JVD. Lungs are clear. Cardiac exam shows a regular rate and rhythm. Life Vest in place. Abdomen is soft. Extremities are without edema. Gait and ROM are intact. No gross neurologic deficits noted.  Wt Readings from Last 3 Encounters:  03/21/14 157 lb 3.2 oz (71.305 kg)  03/16/14 158 lb (71.668 kg)    03/13/14 158 lb (71.668 kg)    LABORATORY DATA/PROCEDURES:  Lab Results  Component Value Date   WBC 8.4 03/16/2014   HGB 12.8 03/16/2014   HCT 37.6 03/16/2014   PLT 214.0 03/16/2014   GLUCOSE 92 03/16/2014   CHOL 264* 03/09/2014   TRIG 103  03/09/2014   HDL 60 03/09/2014   LDLCALC 183* 03/09/2014   ALT 15 03/08/2014   AST 20 03/08/2014   NA 136 03/16/2014   K 4.1 03/16/2014   CL 102 03/16/2014   CREATININE 0.7 03/16/2014   BUN 13 03/16/2014   CO2 27 03/16/2014   INR 0.98 03/08/2014   HGBA1C 6.0* 03/08/2014    BNP (last 3 results)  Recent Labs  03/07/14 2057  PROBNP 112.8    Procedure: Left Heart Cath, Selective Coronary Angiography, LV angiography, PTCA and stenting of the proximal LAD  Indication: 68 yo WF with history of HTN, HL, and family history of CAD presents with an anterior STEMI.  Procedural Details: The right wrist was prepped, draped, and anesthetized with 1% lidocaine. Using the modified Seldinger technique, a 6 French slender sheath was introduced into the right radial artery. 3 mg of verapamil was administered through the sheath, weight-based unfractionated heparin was administered intravenously. Standard Judkins catheters were used for selective coronary angiography and left ventriculography. Catheter exchanges were performed over an exchange length guidewire.  PROCEDURAL FINDINGS  Hemodynamics:  AO 90/53 mean 69 mm Hg  LV 89/27 mm Hg  Coronary angiography:  Coronary dominance: right  Left mainstem: There is 50-70% stenosis in the distal left main.  Left anterior descending (LAD): 70-80% ostial LAD disease. The proximal LAD is 100% occluded.  Left circumflex (LCx): There is 50-70% ostial LCx disease. The proximal vessel has 40-50% stenosis.  Right coronary artery (RCA): The RCA is a dominant vessel. There is diffuse 50% disease in the proximal to mid vessel.  Left ventriculography: Left ventricular systolic function is assessed post PCI. LVEF is estimated at 25-30%. There is  akinesis of the Mid anterior wall and distal inferior wall. There is dyskinesis of the distal anterior wall and apex. There is moderate MR.  PCI Note: Following the diagnostic procedure, the decision was made to proceed with PCI. Weight-based bivalirudin was given for anticoagulation. Brilinta 180 mg was given orally. Once a therapeutic ACT was achieved, a 6 [place] XBLAD 3.5 guide catheter was inserted. A prowater coronary guidewire was used to cross the lesion. The lesion was predilated with a 2.5 mm balloon. The lesion was then stented with a 2.75 x 20 mm Veriflex stent. The stent was postdilated with a 3.0 mm noncompliant balloon. Following PCI, there was 0% residual stenosis and TIMI-3 flow. Final angiography confirmed an excellent result. The patient tolerated the procedure well and was pain free at the end of procedure. There were no immediate procedural complications. A TR band was used for radial hemostasis. The patient was transferred to the post catheterization recovery area for further monitoring.  PCI Data:  Vessel - LAD/Segment - proximal  Percent Stenosis (pre) 100%  TIMI-flow 0  Stent 2.75 x 20 mm Veriflex  Percent Stenosis (post) 0%  TIMI-flow (post) 3  Final Conclusions:  1. 3 vessel and left main CAD. Culprit is occlusion of the proximal LAD.  2. Severe LV dysfunction.  3. Elevated LVEDP  4. Successful stenting of the proximal LAD with a BMS.  Recommendations:  Continue DAPT. Will transfer to ICU for further management. Echo ordered. Hold beta blocker and ACEi for now due to low BP. I think ultimately the patient will need to be considered for CABG for management of Left main and ostial LAD/LCx disease. For this reason a BMS was used.  Peter Martinique, [place]  03/08/2014, 11:08 AM    Echo Study Conclusions from September 2015  -  Left ventricle: Limited study with contrast. LVEF is severely depressed 20 to 25% with akinesis of septum, distal lateral, anterior, inferior and apical  walls. No thrombus.  Assessment / Plan:  1. Recent anterior MI with BMS to the LAD with residual CAD - felt to need CABG - already referred to Dr. Cyndia Bent - Sees him later today. She is doing well clinically. Now almost 2 weeks out from her MI. Has 2 more weeks of Brilinta. May need to transition with Integrelin.   2. Ischemic CM - LifeVest in place. EF of 25%. On low dose beta blocker. Have added low dose ARB as well - she has been on losartan before with no issue.   3. HLD - on Lipitor.   4. Rash - not clear as to the etiology and she has chronic derm issues with her skin and her hair (alopecia). The itching may be from the [place] - she is going to stay on all of her current medicines and continue to use benadryl and cortisone as needed.   Seeing Dr. Cyndia Bent today. She will call on Friday and give me an update. Will then decide about follow up here. I will be happy to continue to see.   Patient is agreeable to this plan and will call if any problems develop in the interim.   Burtis Junes, RN, [place] [address] [place], [place]  [address] [phone]

## 2014-03-23 ENCOUNTER — Encounter: Payer: Self-pay | Admitting: Surgery

## 2014-03-23 ENCOUNTER — Other Ambulatory Visit: Payer: Self-pay | Admitting: *Deleted

## 2014-03-23 ENCOUNTER — Telehealth: Payer: Self-pay | Admitting: Nurse Practitioner

## 2014-03-23 DIAGNOSIS — I2109 ST elevation (STEMI) myocardial infarction involving other coronary artery of anterior wall: Secondary | ICD-10-CM

## 2014-03-23 NOTE — Telephone Encounter (Signed)
S/w pt is agreeable to plan will come in on September 25 for echo and October 2 for ov appt with Cecille Rubin.

## 2014-03-23 NOTE — Telephone Encounter (Signed)
S/w pt saw Dr. Cyndia Bent, Friday, September 11th and would like to have a echo done on pt prior to seeing Dr. Cyndia Bent back on October 7th.  Will send to Cecille Rubin to advise.

## 2014-03-23 NOTE — Progress Notes (Signed)
Cardiothoracic Surgery Consultation   PCP is Tommy Medal, MD Referring Provider is Martinique, Peter M, MD  Chief Complaint  Patient presents with  . NEW CARDIAC    CATHED 9/3    HPI:  The patient is a 68 year old woman with hypertension and hyperlipidemia who presented on 03/07/2014 with a 2 day history of intermittent epigastric and lower substernal chest pain. In the ER her troponin was negative and her ECG showed no new changes. She was given a GI cocktail and her symptoms resolved so she was discharged. The following morning she developed recurrent pain in her chest associated with nausea and shortness of breath. She returned to the ER and her ECG showed anterior and inferior ST elevation with a troponin of 0.07. She was taken to the cath lab as a code STEMI and the culprit was a occluded proximal LAD. She also had 50-70% distal LM, 70-80% ostial LAD, 50-70% ostial LCX and diffuse 50% proximal RCA disease. The LAD was opened with PCI with BMS with a good result. The LVEF post- PCI on ventriculogram was 25-30% with moderate MR. There was akinesis of the mid anterior and distal inferior walls with dyskinesis of the distal anterior wall and apex. Her troponin later that day was > 20. She developed some pulmonary edema that night that responded to diuretics. Her ECG on 03/10/2014, 3 days after he PCI showed diffuse anterolateral ST changes. She was started on Coreg and Cozaar and maintained on Brilinta. She had a life vest placed at discharge. She says that she has been feeling fairly good but still having a little substernal chest discomfort at times.    Past Medical History  Diagnosis Date  . Hypertension   . Hyperlipidemia   . Malaise and fatigue   . Insomnia   . Family history of colon cancer   . Family history of cardiovascular disease   . History of long-term treatment with high-risk medication   . Sinus headache   . Alopecia   . CAD (coronary artery disease)     a. 03/2014 Ant  STEMI/PCI: LM 50-70d, LAD 70-80ost, 100p (2.75x20 Veriflex BMS), LCX 50-70ost, 40-50p, RCA dom - 50p/m, EF 25-30%, mid ant/dist inf AK, dist ant/apical DK, mod MR.  . Ischemic cardiomyopathy     a. 03/2014 Echo: EF 25-30%, mid-apical/anteroseptal and inf AK, Gr 2 DD, ? apical thrombus, Mild MR.-->Discharged with LifeVest.  . Chronic systolic CHF (congestive heart failure)     a. 03/2014 Echo: EF 25-30%.    Past Surgical History  Procedure Laterality Date  . Cardiac catheterization  2001    showed no blockages  . Cystectomy  1998    fibroid cysts  . 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 Cervical spine surgery  09/27/2003    C6-67 servical fusion  . Dilation and curettage of uterus  1976    2nd to miscarriage  . Cataract extraction w/phaco Left 01/04/2014    Procedure: CATARACT EXTRACTION PHACO AND INTRAOCULAR LENS PLACEMENT (IOC);  Surgeon: Tonny Branch, MD;  Location: [facility];  Service: Ophthalmology;  Laterality: Left;  CDE:5.65  . Cataract extraction w/phaco Right 01/29/2014    Procedure: CATARACT EXTRACTION PHACO AND INTRAOCULAR LENS PLACEMENT RIGHT EYE CDE=4.49;  Surgeon: Tonny Branch, MD;  Location: [facility];  Service: Ophthalmology;  Laterality: Right;    Family History  Problem Relation Age of Onset  . Stroke Mother   . Heart attack Mother   .  Peripheral vascular disease Father     had stent in leg  . Colon cancer Father   . Atrial fibrillation Father     with pacemaker  . Thyroid disease Father   . Pancreatic cancer Brother   . Coronary artery disease Brother     with Stent  . Coronary artery disease Brother     with CABG in his 52's  . Thyroid disease Brother   . Thyroid disease Sister     Social History History  Substance Use Topics  . Smoking status: Never Smoker   . Smokeless tobacco: Never Used  . Alcohol Use: Yes     Comment: rare glass of wine    Current Outpatient Prescriptions  Medication Sig Dispense Refill  .  aspirin EC 81 MG tablet Take 81 mg by mouth daily.      Marland Kitchen atorvastatin (LIPITOR) 80 MG tablet Take 1 tablet (80 mg total) by mouth daily at 6 PM.  30 tablet  6  . carvedilol (COREG) 3.125 MG tablet Take 1 tablet (3.125 mg total) by mouth 2 (two) times daily with a meal.  60 tablet  6  . isosorbide mononitrate (IMDUR) 30 MG 24 hr tablet Take 0.5 tablets (15 mg total) by mouth daily.  90 tablet  3  . losartan (COZAAR) 25 MG tablet Take 0.5 tablets (12.5 mg total) by mouth daily.  30 tablet  3  . ticagrelor (BRILINTA) 90 MG TABS tablet Take 1 tablet (90 mg total) by mouth 2 (two) times daily.  60 tablet  2  . nitroGLYCERIN (NITROSTAT) 0.4 MG SL tablet Place 1 tablet (0.4 mg total) under the tongue every 5 (five) minutes as needed for chest pain.  25 tablet  3   No current facility-administered medications for this visit.    Allergies  Allergen Reactions  . Demerol [Meperidine]     Nausea   . Dexilant [Dexlansoprazole]     Rash   . Lisinopril     cough  . Meperidine Hcl   . Sulfonamide Derivatives     Rash   . Latex Rash    Review of Systems  Constitutional: Positive for fatigue. Negative for fever, activity change and appetite change.       Has been watching her diet closely and has lost 6 lbs.  HENT: Negative.   Eyes: Negative.   Respiratory: Negative for cough and shortness of breath.   Cardiovascular: Positive for chest pain and leg swelling. Negative for palpitations.       No orthopnea or PND  Gastrointestinal: Negative.   Endocrine: Negative.   Genitourinary: Negative.   Musculoskeletal: Negative.   Skin: Negative.   Neurological: Negative.   Hematological: Negative.   Psychiatric/Behavioral: Negative.     BP 116/72  Pulse 70  Ht 5' 5.5" (1.664 m)  Wt 157 lb (71.215 kg)  BMI 25.72 kg/m2  SpO2 98% Physical Exam  Constitutional: She is oriented to person, place, and time. She appears well-developed and well-nourished. No distress.  HENT:  Head: Normocephalic and  atraumatic.  Mouth/Throat: Oropharynx is clear and moist.  Eyes: EOM are normal. Pupils are equal, round, and reactive to light.  Neck: Neck supple. No JVD present. No thyromegaly present.  Cardiovascular: Normal rate, regular rhythm, normal heart sounds and intact distal pulses.  Exam reveals no friction rub.   No murmur heard. Pulmonary/Chest: Effort normal and breath sounds normal. No respiratory distress. She has no rales.  Abdominal: Soft. Bowel sounds are normal. She exhibits  no distension and no mass. There is no tenderness.  Musculoskeletal: She exhibits no edema.  Neurological: She is alert and oriented to person, place, and time. She has normal strength. No cranial nerve deficit or sensory deficit.  Skin: Skin is warm and dry.  Psychiatric: She has a normal mood and affect.     Diagnostic Tests:  *[facility]* [address], [place] [address] [phone]  ------------------------------------------------------------------- Transthoracic Echocardiography  Patient: Jeydi, Klingel MR #: [id number] Study Date: 03/12/2014 Gender: F Age: 45 Height: 165.1 cm Weight: 72.6 kg BSA: 1.84 m^2 Pt. Status: Room: 2W07C  ADMITTING Peter Martinique, M.D. ATTENDING Peter Martinique, M.D. ORDERING Kirk Ruths SONOGRAPHER Melissa Morford, RDCS PERFORMING [facility], Inpatient  cc:  -------------------------------------------------------------------  ------------------------------------------------------------------- Indications: MI - acute 410.91.  ------------------------------------------------------------------- History: PMH: Chest pain. Coronary artery disease. Congestive heart failure. PMH: Myocardial infarction.  ------------------------------------------------------------------- Study Conclusions  - Left ventricle: Limited study with contrast. LVEF is severely depressed 20 to 25% with akinesis of septum, distal lateral, anterior,  inferior and apical walls. No thrombus.  Transthoracic echocardiography. M-mode, limited 2D, limited spectral Doppler, and color Doppler. Birthdate: Patient birthdate: 1945-11-18. Age: Patient is 68 yr old. Sex: Gender: female. BMI: 26.6 kg/m^2. Blood pressure: 101/53 Patient status: Inpatient. Study date: Study date: 03/12/2014. Study time: 11:05 AM. Location: Bedside.  -------------------------------------------------------------------  ------------------------------------------------------------------- Left ventricle: Limited study with contrast. LVEF is severely depressed 20 to 25% with akinesis of septum, distal lateral, anterior, inferior and apical walls. No thrombus.  ------------------------------------------------------------------- Prepared and Electronically Authenticated by  Dorris Carnes, M.D. 2015-09-07T12:16:33   Cardiac Catheterization Procedure Note  Name: Tinya Cadogan Rappaport  MRN: [id number]  DOB: Jun 25, 1946  Procedure: Left Heart Cath, Selective Coronary Angiography, LV angiography, PTCA and stenting of the proximal LAD  Indication: 68 yo WF with history of HTN, HL, and family history of CAD presents with an anterior STEMI.  Procedural Details: The right wrist was prepped, draped, and anesthetized with 1% lidocaine. Using the modified Seldinger technique, a 6 French slender sheath was introduced into the right radial artery. 3 mg of verapamil was administered through the sheath, weight-based unfractionated heparin was administered intravenously. Standard Judkins catheters were used for selective coronary angiography and left ventriculography. Catheter exchanges were performed over an exchange length guidewire.  PROCEDURAL FINDINGS  Hemodynamics:  AO 90/53 mean 69 mm Hg  LV 89/27 mm Hg  Coronary angiography:  Coronary dominance: right  Left mainstem: There is 50-70% stenosis in the distal left main.  Left anterior descending (LAD): 70-80% ostial LAD disease. The proximal  LAD is 100% occluded.  Left circumflex (LCx): There is 50-70% ostial LCx disease. The proximal vessel has 40-50% stenosis.  Right coronary artery (RCA): The RCA is a dominant vessel. There is diffuse 50% disease in the proximal to mid vessel.  Left ventriculography: Left ventricular systolic function is assessed post PCI. LVEF is estimated at 25-30%. There is akinesis of the Mid anterior wall and distal inferior wall. There is dyskinesis of the distal anterior wall and apex. There is moderate MR.  PCI Note: Following the diagnostic procedure, the decision was made to proceed with PCI. Weight-based bivalirudin was given for anticoagulation. Brilinta 180 mg was given orally. Once a therapeutic ACT was achieved, a 6 [place] XBLAD 3.5 guide catheter was inserted. A prowater coronary guidewire was used to cross the lesion. The lesion was predilated with a 2.5 mm balloon. The lesion was then stented with a 2.75 x 20 mm Veriflex stent. The  stent was postdilated with a 3.0 mm noncompliant balloon. Following PCI, there was 0% residual stenosis and TIMI-3 flow. Final angiography confirmed an excellent result. The patient tolerated the procedure well and was pain free at the end of procedure. There were no immediate procedural complications. A TR band was used for radial hemostasis. The patient was transferred to the post catheterization recovery area for further monitoring.  PCI Data:  Vessel - LAD/Segment - proximal  Percent Stenosis (pre) 100%  TIMI-flow 0  Stent 2.75 x 20 mm Veriflex  Percent Stenosis (post) 0%  TIMI-flow (post) 3  Final Conclusions:  1. 3 vessel and left main CAD. Culprit is occlusion of the proximal LAD.  2. Severe LV dysfunction.  3. Elevated LVEDP  4. Successful stenting of the proximal LAD with a BMS.  Recommendations:  Continue DAPT. Will transfer to ICU for further management. Echo ordered. Hold beta blocker and ACEi for now due to low BP. I think ultimately the patient will need to  be considered for CABG for management of Left main and ostial LAD/LCx disease. For this reason a BMS was used.  Peter Martinique, Dora  03/08/2014, 11:08 AM    Impression:  She has a significant distal left main bifurcation stenosis that extends into the ostium of the LAD and LCX and is still at risk of further ischemia and infarction despite opening up her occluded LAD. Unfortunately she had a large infarct resulting in poor LV function despite having a low troponin at presentation and quick reperfusion of the LAD. I think she must have embolized some of the thrombus present in the proximal LAD. I think she will require CABG to prevent further ischemia and infarction from the residual stenoses.  She needs to stay on DAPT for a month and then may need to be bridged once off Brilinta prior to surgery. She will need to be off Brilinta for at least 5 days preop. She will need another echo prior to surgery to assess LV function.    Plan:  I will see her back in early October to make surgical plans.

## 2014-03-23 NOTE — Telephone Encounter (Signed)
New message    Patient calling back to speak with Cecille Rubin regarding appt with Dr. Cyndia Bent.

## 2014-03-23 NOTE — Telephone Encounter (Signed)
Ok to schedule.

## 2014-03-26 ENCOUNTER — Telehealth: Payer: Self-pay | Admitting: Nurse Practitioner

## 2014-03-26 NOTE — Telephone Encounter (Signed)
Pt asked if could eat greens I stated yes.  Pt is not on coumadin.

## 2014-03-26 NOTE — Telephone Encounter (Signed)
°  Patient has a question about eating turnip greens and taking Berlinta medication. Please call and advise.

## 2014-03-30 ENCOUNTER — Ambulatory Visit (HOSPITAL_COMMUNITY): Payer: Medicare Other | Attending: Internal Medicine | Admitting: Radiology

## 2014-03-30 DIAGNOSIS — I2109 ST elevation (STEMI) myocardial infarction involving other coronary artery of anterior wall: Secondary | ICD-10-CM | POA: Diagnosis not present

## 2014-03-30 DIAGNOSIS — I10 Essential (primary) hypertension: Secondary | ICD-10-CM | POA: Diagnosis not present

## 2014-03-30 DIAGNOSIS — I255 Ischemic cardiomyopathy: Secondary | ICD-10-CM

## 2014-03-30 DIAGNOSIS — E785 Hyperlipidemia, unspecified: Secondary | ICD-10-CM | POA: Insufficient documentation

## 2014-03-30 DIAGNOSIS — Z8249 Family history of ischemic heart disease and other diseases of the circulatory system: Secondary | ICD-10-CM | POA: Diagnosis not present

## 2014-03-30 DIAGNOSIS — I25119 Atherosclerotic heart disease of native coronary artery with unspecified angina pectoris: Secondary | ICD-10-CM

## 2014-03-30 NOTE — Progress Notes (Signed)
Echocardiogram performed.  

## 2014-04-06 ENCOUNTER — Ambulatory Visit (INDEPENDENT_AMBULATORY_CARE_PROVIDER_SITE_OTHER): Payer: Medicare Other | Admitting: Nurse Practitioner

## 2014-04-06 ENCOUNTER — Encounter: Payer: Self-pay | Admitting: Nurse Practitioner

## 2014-04-06 ENCOUNTER — Ambulatory Visit: Payer: Medicare Other | Admitting: Nurse Practitioner

## 2014-04-06 VITALS — BP 102/80 | HR 61 | Ht 65.5 in | Wt 154.4 lb

## 2014-04-06 DIAGNOSIS — I5022 Chronic systolic (congestive) heart failure: Secondary | ICD-10-CM

## 2014-04-06 DIAGNOSIS — I2102 ST elevation (STEMI) myocardial infarction involving left anterior descending coronary artery: Secondary | ICD-10-CM | POA: Diagnosis not present

## 2014-04-06 DIAGNOSIS — E785 Hyperlipidemia, unspecified: Secondary | ICD-10-CM

## 2014-04-06 DIAGNOSIS — I255 Ischemic cardiomyopathy: Secondary | ICD-10-CM

## 2014-04-06 DIAGNOSIS — I2109 ST elevation (STEMI) myocardial infarction involving other coronary artery of anterior wall: Secondary | ICD-10-CM

## 2014-04-06 LAB — BASIC METABOLIC PANEL
BUN: 10 mg/dL (ref 6–23)
CO2: 24 mEq/L (ref 19–32)
Calcium: 9.7 mg/dL (ref 8.4–10.5)
Chloride: 106 mEq/L (ref 96–112)
Creatinine, Ser: 0.7 mg/dL (ref 0.4–1.2)
GFR: 82.68 mL/min (ref >60.00)
Glucose, Bld: 100 mg/dL — ABNORMAL HIGH (ref 70–99)
Potassium: 3.7 mEq/L (ref 3.5–5.1)
Sodium: 137 mEq/L (ref 135–145)

## 2014-04-06 MED ORDER — LOSARTAN POTASSIUM 25 MG PO TABS: 25.0000 mg | ORAL_TABLET | Freq: Every day | ORAL | Status: DC

## 2014-04-06 NOTE — Progress Notes (Signed)
Kelli Roy Date of Birth: 18-Nov-1945 Medical Record #[id number]  History of Present Illness: Kelli Roy is seen back today for a follow up visit. Seen for Kelli Roy. She has HTN, HLD and family history of CAD.   Recently admitted in early September with chest pain. Had emergent cath showing moderate distal left main/ostial LAD disease along with an occluded proximal LAD. Also with moderate LCX and RCA disease. BMS to the LAD was performed. BMS used due to strong likelihood of needing CABG with her moderate multivessel disease. EF was 25 to 30% by echo and was concerning for an apical thrombus however repeat limited echo at discharge with contrast did not show any evidence of LV thrombus. Troponin peaked over 20. She was discharged on September 6th on Brilinta for a minimum of 4 weeks with ASA, statin and BB therapy. Her course was complicated by a rash - not clear as to the etiology - she was on ACE and has had past cough - thus her ACE was stopped. BP little soft so ARB was not started. She does have a LifeVest in place.   I have seen her several times since her discharge - have added low dose Imdur and low dose ARB to her regimen - she had been on losartan in the past. Referred to Kelli Roy as well. Has had follow up echo per Kelli Roy request - results as noted below.   Comes in today. Here with her husband today. She is doing ok. Not lightheaded or dizzy. Some higher blood pressures at home noted. May have some fleeting chest pain - nothing like she had with her MI. She is disappointed in her echo results. Sees Kelli Roy back on the 14th. Remains on her Brilinta. Rash is better but not resolved. Not short of breath. Weight is down. Continues to restrict her activities. No problem with her Life Vest reported.   Current Outpatient Prescriptions  Medication Sig Dispense Refill  . aspirin EC 81 MG tablet Take 81 mg by mouth daily.      Marland Kitchen atorvastatin (LIPITOR) 80 MG tablet Take 1 tablet (80  mg total) by mouth daily at 6 PM.  30 tablet  6  . carvedilol (COREG) 3.125 MG tablet Take 1 tablet (3.125 mg total) by mouth 2 (two) times daily with a meal.  60 tablet  6  . isosorbide mononitrate (IMDUR) 30 MG 24 hr tablet Take 0.5 tablets (15 mg total) by mouth daily.  90 tablet  3  . losartan (COZAAR) 25 MG tablet Take 0.5 tablets (12.5 mg total) by mouth daily.  30 tablet  3  . nitroGLYCERIN (NITROSTAT) 0.4 MG SL tablet Place 1 tablet (0.4 mg total) under the tongue every 5 (five) minutes as needed for chest pain.  25 tablet  3  . ticagrelor (BRILINTA) 90 MG TABS tablet Take 1 tablet (90 mg total) by mouth 2 (two) times daily.  60 tablet  2   No current facility-administered medications for this visit.    Allergies  Allergen Reactions  . Demerol [Meperidine]     Nausea   . Dexilant [Dexlansoprazole]     Rash   . Lisinopril     cough  . Meperidine Hcl   . Sulfonamide Derivatives     Rash   . Latex Rash    Past Medical History  Diagnosis Date  . Hypertension   . Hyperlipidemia   . Malaise and fatigue   . Insomnia   . Family  history of colon cancer   . Family history of cardiovascular disease   . History of long-term treatment with high-risk medication   . Sinus headache   . Alopecia   . CAD (coronary artery disease)     a. 03/2014 Ant STEMI/PCI: LM 50-70d, LAD 70-80ost, 100p (2.75x20 Veriflex BMS), LCX 50-70ost, 40-50p, RCA dom - 50p/m, EF 25-30%, mid ant/dist inf AK, dist ant/apical DK, mod MR.  . Ischemic cardiomyopathy     a. 03/2014 Echo: EF 25-30%, mid-apical/anteroseptal and inf AK, Gr 2 DD, ? apical thrombus, Mild MR.-->Discharged with LifeVest.  . Chronic systolic CHF (congestive heart failure)     a. 03/2014 Echo: EF 25-30%.    Past Surgical History  Procedure Laterality Date  . Cardiac catheterization  2001    showed no blockages  . Cystectomy  1998    fibroid cysts  . 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  Cervical spine surgery  09/27/2003    C6-67 servical fusion  . Dilation and curettage of uterus  1976    2nd to miscarriage  . Cataract extraction w/phaco Left 01/04/2014    Procedure: CATARACT EXTRACTION PHACO AND INTRAOCULAR LENS PLACEMENT (IOC);  Surgeon: Kelli Branch, MD;  Location: [facility];  Service: Ophthalmology;  Laterality: Left;  CDE:5.65  . Cataract extraction w/phaco Right 01/29/2014    Procedure: CATARACT EXTRACTION PHACO AND INTRAOCULAR LENS PLACEMENT RIGHT EYE CDE=4.49;  Surgeon: Kelli Branch, MD;  Location: [facility];  Service: Ophthalmology;  Laterality: Right;    History  Smoking status  . Never Smoker   Smokeless tobacco  . Never Used    History  Alcohol Use  . Yes    Comment: rare glass of wine    Family History  Problem Relation Age of Onset  . Stroke Mother   . Heart attack Mother   . Peripheral vascular disease Father     had stent in leg  . Colon cancer Father   . Atrial fibrillation Father     with pacemaker  . Thyroid disease Father   . Pancreatic cancer Brother   . Coronary artery disease Brother     with Stent  . Coronary artery disease Brother     with CABG in his 63's  . Thyroid disease Brother   . Thyroid disease Sister     Review of Systems: The review of systems is per the HPI.  All other systems were reviewed and are negative.  Physical Exam: BP 102/80  Pulse 61  Ht 5' 5.5" (1.664 m)  Wt 154 lb 6.4 oz (70.035 kg)  BMI 25.29 kg/m2  SpO2 97% Patient is very pleasant and in no acute distress. Skin is warm and dry. Color is normal.  HEENT is unremarkable. Normocephalic/atraumatic. PERRL. Sclera are nonicteric. Neck is supple. No masses. No JVD. Lungs are clear. Cardiac exam shows a regular rate and rhythm. Abdomen is soft. Extremities are without edema. Gait and ROM are intact. No gross neurologic deficits noted.  Wt Readings from Last 3 Encounters:  04/06/14 154 lb 6.4 oz (70.035 kg)  03/21/14 157 lb (71.215 kg)  03/21/14 157 lb 3.2 oz  (71.305 kg)    LABORATORY DATA/PROCEDURES:  Lab Results  Component Value Date   WBC 8.4 03/16/2014   HGB 12.8 03/16/2014   HCT 37.6 03/16/2014   PLT 214.0 03/16/2014   GLUCOSE 92 03/16/2014   CHOL 264* 03/09/2014   TRIG 103 03/09/2014   HDL  60 03/09/2014   LDLCALC 183* 03/09/2014   ALT 15 03/08/2014   AST 20 03/08/2014   NA 136 03/16/2014   K 4.1 03/16/2014   CL 102 03/16/2014   CREATININE 0.7 03/16/2014   BUN 13 03/16/2014   CO2 27 03/16/2014   INR 0.98 03/08/2014   HGBA1C 6.0* 03/08/2014    BNP (last 3 results)  Recent Labs  03/07/14 2057  PROBNP 112.8    Procedure: Left Heart Cath, Selective Coronary Angiography, LV angiography, PTCA and stenting of the proximal LAD  Indication: 68 yo WF with history of HTN, HL, and family history of CAD presents with an anterior STEMI.  Procedural Details: The right wrist was prepped, draped, and anesthetized with 1% lidocaine. Using the modified Seldinger technique, a 6 French slender sheath was introduced into the right radial artery. 3 mg of verapamil was administered through the sheath, weight-based unfractionated heparin was administered intravenously. Standard Judkins catheters were used for selective coronary angiography and left ventriculography. Catheter exchanges were performed over an exchange length guidewire.  PROCEDURAL FINDINGS  Hemodynamics:  AO 90/53 mean 69 mm Hg  LV 89/27 mm Hg  Coronary angiography:  Coronary dominance: right  Left mainstem: There is 50-70% stenosis in the distal left main.  Left anterior descending (LAD): 70-80% ostial LAD disease. The proximal LAD is 100% occluded.  Left circumflex (LCx): There is 50-70% ostial LCx disease. The proximal vessel has 40-50% stenosis.  Right coronary artery (RCA): The RCA is a dominant vessel. There is diffuse 50% disease in the proximal to mid vessel.  Left ventriculography: Left ventricular systolic function is assessed post PCI. LVEF is estimated at 25-30%. There is akinesis of the Mid  anterior wall and distal inferior wall. There is dyskinesis of the distal anterior wall and apex. There is moderate MR.  PCI Note: Following the diagnostic procedure, the decision was made to proceed with PCI. Weight-based bivalirudin was given for anticoagulation. Brilinta 180 mg was given orally. Once a therapeutic ACT was achieved, a 6 [place] XBLAD 3.5 guide catheter was inserted. A prowater coronary guidewire was used to cross the lesion. The lesion was predilated with a 2.5 mm balloon. The lesion was then stented with a 2.75 x 20 mm Veriflex stent. The stent was postdilated with a 3.0 mm noncompliant balloon. Following PCI, there was 0% residual stenosis and TIMI-3 flow. Final angiography confirmed an excellent result. The patient tolerated the procedure well and was pain free at the end of procedure. There were no immediate procedural complications. A TR band was used for radial hemostasis. The patient was transferred to the post catheterization recovery area for further monitoring.  PCI Data:  Vessel - LAD/Segment - proximal  Percent Stenosis (pre) 100%  TIMI-flow 0  Stent 2.75 x 20 mm Veriflex  Percent Stenosis (post) 0%  TIMI-flow (post) 3  Final Conclusions:  1. 3 vessel and left main CAD. Culprit is occlusion of the proximal LAD.  2. Severe LV dysfunction.  3. Elevated LVEDP  4. Successful stenting of the proximal LAD with a BMS.  Recommendations:  Continue DAPT. Will transfer to ICU for further management. Echo ordered. Hold beta blocker and ACEi for now due to low BP. I think ultimately the patient will need to be considered for CABG for management of Left main and ostial LAD/LCx disease. For this reason a BMS was used.  Peter Roy, [place]  03/08/2014, 11:08 AM    Echo Study Conclusions from 03/12/14 - Left ventricle: Limited study with contrast.  LVEF is severely depressed 20 to 25% with akinesis of septum, distal lateral, anterior, inferior and apical walls. No thrombus.   Echo  Study Conclusions from 03/30/14  - Left ventricle: There is akinesis of all the apical and mid anterior, anteroseptal and anterolateral walls.  The cavity size was normal. Systolic function was severely reduced. The estimated ejection fraction was in the range of 25% to 30%. Doppler parameters are consistent with a restrictive pattern, indicative of decreased left ventricular diastolic compliance and/or increased left atrial pressure (grade 3 diastolic dysfunction). Doppler parameters are consistent with elevated ventricular end-diastolic filling pressure. - Aortic valve: Trileaflet; normal thickness leaflets. There was no regurgitation. - Mitral valve: There was mild to moderate regurgitation. - Left atrium: The atrium was mildly dilated. - Right atrium: The atrium was normal in size. - Tricuspid valve: There was mild-moderate regurgitation. - Pulmonary arteries: PA peak pressure: 55 mm Hg (S). - Pericardium, extracardiac: A mild pericardial effusion was identified posterior to the heart. Features were not consistent with tamponade physiology.  Impressions:  - No significant change when compared to the study from 03/12/2014.   Assessment / Plan:  1. Recent anterior MI with BMS to the LAD with residual CAD - now one month out from her event - felt to need CABG - already referred to Kelli Roy and sees him again on the 14th. I am leaving her on her current regimen which will include the Brilinta. Will need to decide about when to stop/if to bridge/admit prior, etc.    2. Ischemic CM - LifeVest in place. EF of 25%. On low dose beta blocker. Have added low dose ARB as well - I am increasing this to 25 mg today. She will continue to monitor her BP at home.   3. HLD - on Lipitor.   4. Rash - not clear as to the etiology and she has chronic derm issues with her skin and her hair (alopecia). The itching may be from the [place] - she is going to stay on all of her current medicines and  continue to use benadryl and cortisone as needed.  Check BMET today. ARB is increased. She is NOT to increase her activities from her current level. I have given her a time to come back in about 5 to 6 weeks for a post hospital visit.    Patient is agreeable to this plan and will call if any problems develop in the interim.   Burtis Junes, RN, [place] [address] [place], [place]  [id number] [phone]

## 2014-04-06 NOTE — Patient Instructions (Addendum)
We will be checking the following labs today BMET  Increase the Losartan to 25 mg (whole tablet) daily  See Dr. Cyndia Bent as planned  I will see you 5 to 6 weeks  Call the [place] office at [phone] if you have any questions, problems or concerns.

## 2014-04-11 ENCOUNTER — Ambulatory Visit: Payer: Medicare Other | Admitting: Surgery

## 2014-04-18 ENCOUNTER — Encounter: Payer: Self-pay | Admitting: Surgery

## 2014-04-18 ENCOUNTER — Ambulatory Visit (INDEPENDENT_AMBULATORY_CARE_PROVIDER_SITE_OTHER): Payer: Medicare Other | Admitting: Surgery

## 2014-04-18 VITALS — BP 132/77 | HR 63 | Ht 65.5 in | Wt 154.0 lb

## 2014-04-18 DIAGNOSIS — I25119 Atherosclerotic heart disease of native coronary artery with unspecified angina pectoris: Secondary | ICD-10-CM | POA: Diagnosis not present

## 2014-04-18 DIAGNOSIS — I209 Angina pectoris, unspecified: Secondary | ICD-10-CM

## 2014-04-18 DIAGNOSIS — I255 Ischemic cardiomyopathy: Secondary | ICD-10-CM | POA: Diagnosis not present

## 2014-04-20 ENCOUNTER — Encounter: Payer: Self-pay | Admitting: Surgery

## 2014-04-20 ENCOUNTER — Other Ambulatory Visit: Payer: Self-pay

## 2014-04-20 ENCOUNTER — Encounter (HOSPITAL_COMMUNITY): Payer: Self-pay

## 2014-04-20 DIAGNOSIS — I25119 Atherosclerotic heart disease of native coronary artery with unspecified angina pectoris: Secondary | ICD-10-CM

## 2014-04-20 NOTE — Progress Notes (Signed)
HPI:  She returns today for follow up to discuss further plans for coronary artery bypass graft surgery. The patient is a 68 year old woman with hypertension and hyperlipidemia who presented on 03/07/2014 with a 2 day history of intermittent epigastric and lower substernal chest pain. In the ER her troponin was negative and her ECG showed no new changes. She was given a GI cocktail and her symptoms resolved so she was discharged. The following morning she developed recurrent pain in her chest associated with nausea and shortness of breath. She returned to the ER and her ECG showed anterior and inferior ST elevation with a troponin of 0.07. She was taken to the cath lab as a code STEMI and the culprit was a occluded proximal LAD. She also had 50-70% distal LM, 70-80% ostial LAD, 50-70% ostial LCX and diffuse 50% proximal RCA disease. The LAD was opened with PCI with BMS with a good result. The LVEF post- PCI on ventriculogram was 25-30% with moderate MR. There was akinesis of the mid anterior and distal inferior walls with dyskinesis of the distal anterior wall and apex. Her troponin later that day was > 20. She developed some pulmonary edema that night that responded to diuretics. Her ECG on 03/10/2014, 3 days after he PCI showed diffuse anterolateral ST changes. She was started on Coreg and Cozaar and maintained on Brilinta. She had a life vest placed at discharge. She says that she has been feeling fairly good but still having a little substernal chest discomfort at times. She has been fatigued but is not sure if that is related to the medications, her heart disease, or the fact that she is not eating much and has lost weight. She says she has been very strict with her diet and sodium intake and nothing tastes good.      Current Outpatient Prescriptions  Medication Sig Dispense Refill  . aspirin EC 81 MG tablet Take 81 mg by mouth daily.      Marland Kitchen atorvastatin (LIPITOR) 80 MG tablet Take 1 tablet (80  mg total) by mouth daily at 6 PM.  30 tablet  6  . carvedilol (COREG) 3.125 MG tablet Take 1 tablet (3.125 mg total) by mouth 2 (two) times daily with a meal.  60 tablet  6  . isosorbide mononitrate (IMDUR) 30 MG 24 hr tablet Take 0.5 tablets (15 mg total) by mouth daily.  90 tablet  3  . losartan (COZAAR) 25 MG tablet Take 1 tablet (25 mg total) by mouth daily.  30 tablet  3  . ticagrelor (BRILINTA) 90 MG TABS tablet Take 1 tablet (90 mg total) by mouth 2 (two) times daily.  60 tablet  2  . nitroGLYCERIN (NITROSTAT) 0.4 MG SL tablet Place 1 tablet (0.4 mg total) under the tongue every 5 (five) minutes as needed for chest pain.  25 tablet  3   No current facility-administered medications for this visit.     Physical Exam: BP 132/77  Pulse 63  Ht 5' 5.5" (1.664 m)  Wt 154 lb (69.854 kg)  BMI 25.23 kg/m2  SpO2 98% She looks well Cardiovascular: Normal rate, regular rhythm, normal heart sounds and intact distal pulses. Exam reveals no friction rub.  No murmur heard.  Pulmonary/Chest: Effort normal and breath sounds normal. No respiratory distress. She has no rales.  There is no peripheral edema.  Diagnostic Tests:  [facility] [address], [place] [address] [phone]  ------------------------------------------------------------------- Transthoracic  Echocardiography  Patient: [place], Bujak MR #: [id number] Study Date: 03/30/2014 Gender: F Age: 48 Height: 167.6 cm Weight: 71.2 kg BSA: 1.83 m^2 Pt. Status: Room:  SONOGRAPHER Victorio Palm, [facility] ATTENDING Default, Provider [phone] Jolayne Haines, [place] Windell Moment, [place], [place], Outpatient  cc:  ------------------------------------------------------------------- LV EF: 25% - 30%  ------------------------------------------------------------------- Indications: MI - anterior wall - follow-up  410.12.  ------------------------------------------------------------------- History: PMH: Acquired from the patient and from the patient&'s chart. Chest pain. Bilateral lower extremity edema. Coronary artery disease. Ischemic cardiomyopathy, with congestive heart failure, with an ejection fraction of 25%by echocardiography. The dysfunction is primarily systolic. PMH: Myocardial infarction. Risk factors: Family history of coronary artery disease. Hypertension. Dyslipidemia.  ------------------------------------------------------------------- Study Conclusions  - Left ventricle: There is akinesis of all the apical and mid anterior, anteroseptal and anterolateral walls.  The cavity size was normal. Systolic function was severely reduced. The estimated ejection fraction was in the range of 25% to 30%. Doppler parameters are consistent with a restrictive pattern, indicative of decreased left ventricular diastolic compliance and/or increased left atrial pressure (grade 3 diastolic dysfunction). Doppler parameters are consistent with elevated ventricular end-diastolic filling pressure. - Aortic valve: Trileaflet; normal thickness leaflets. There was no regurgitation. - Mitral valve: There was mild to moderate regurgitation. - Left atrium: The atrium was mildly dilated. - Right atrium: The atrium was normal in size. - Tricuspid valve: There was mild-moderate regurgitation. - Pulmonary arteries: PA peak pressure: 55 mm Hg (S). - Pericardium, extracardiac: A mild pericardial effusion was identified posterior to the heart. Features were not consistent with tamponade physiology.  Impressions:  - No significant change when compared to the study from 03/12/2014.  ------------------------------------------------------------------- Labs, prior tests, procedures, and surgery: Echocardiography (September 2015). EF was 25%.  Catheterization (September 2015). The study demonstrated coronary  artery disease. There was an occlusion in the left anterior descending coronary artery which was treated with a bare-metal stent. Moderate RCA and LCX disease. Transthoracic echocardiography. M-mode, complete 2D, spectral Doppler, and color Doppler. Birthdate: Patient birthdate: 1945-08-15. Age: Patient is 67 yr old. Sex: Gender: female. BMI: 25.3 kg/m^2. Blood pressure: 120/75 Patient status: Outpatient. Study date: Study date: 03/30/2014. Study time: 03:55 PM. Location: [facility]  -------------------------------------------------------------------  ------------------------------------------------------------------- Left ventricle: There is akinesis of all the apical and mid anterior, anteroseptal and anterolateral walls.  The cavity size was normal. Systolic function was severely reduced. The estimated ejection fraction was in the range of 25% to 30%. Doppler parameters are consistent with a restrictive pattern, indicative of decreased left ventricular diastolic compliance and/or increased left atrial pressure (grade 3 diastolic dysfunction). Doppler parameters are consistent with elevated ventricular end-diastolic filling pressure.  ------------------------------------------------------------------- Aortic valve: Trileaflet; normal thickness leaflets. Mobility was not restricted. Doppler: Transvalvular velocity was within the normal range. There was no stenosis. There was no regurgitation.  ------------------------------------------------------------------- Aorta: Aortic root: The aortic root was normal in size.  ------------------------------------------------------------------- Mitral valve: Structurally normal valve. Mobility was not restricted. Doppler: Transvalvular velocity was within the normal range. There was no evidence for stenosis. There was mild to moderate regurgitation. Peak gradient (D): 5 mm  Hg.  ------------------------------------------------------------------- Left atrium: The atrium was mildly dilated.  ------------------------------------------------------------------- Right ventricle: The cavity size was normal. Wall thickness was normal. Systolic function was normal.  ------------------------------------------------------------------- Pulmonic valve: Structurally normal valve. Cusp separation was normal. Doppler: Transvalvular velocity was within the normal range. There was no evidence for stenosis. There was no regurgitation.  ------------------------------------------------------------------- Tricuspid valve:  Structurally normal valve. Doppler: Transvalvular velocity was within the normal range. There was mild-moderate regurgitation.  ------------------------------------------------------------------- Pulmonary artery: The main pulmonary artery was normal-sized. Systolic pressure was within the normal range.  ------------------------------------------------------------------- Right atrium: The atrium was normal in size.  ------------------------------------------------------------------- Pericardium: A mild pericardial effusion was identified posterior to the heart. Doppler: Features were not consistent with tamponade physiology.  ------------------------------------------------------------------- Systemic veins: Inferior vena cava: The vessel was normal in size.  ------------------------------------------------------------------- Measurements  Left ventricle Value Reference LV ID, ED, PLAX chordal 46 mm 43 - 52 LV ID, ES, PLAX chordal 29 mm 23 - 38 LV fx shortening, PLAX chordal 37 % >=29 LV PW thickness, ED 9 mm --------- IVS/LV PW ratio, ED 0.89 <=1.3 Stroke volume, 2D 45 ml --------- Stroke volume/bsa, 2D 25 ml/m^2 --------- LV e&', lateral 6.64 cm/s --------- LV E/e&', lateral 16.27 --------- LV e&', medial 4.68 cm/s --------- LV E/e&',  medial 23.08 --------- LV e&', average 5.66 cm/s --------- LV E/e&', average 19.08 ---------  Ventricular septum Value Reference IVS thickness, ED 8 mm ---------  LVOT Value Reference LVOT ID, S 17 mm --------- LVOT area 2.27 cm^2 --------- LVOT ID 17 mm --------- LVOT peak velocity, S 101 cm/s --------- LVOT mean velocity, S 70.2 cm/s --------- LVOT VTI, S 20 cm --------- Stroke volume (SV), LVOT DP 45.4 ml --------- Stroke index (SV/bsa), LVOT DP 24.8 ml/m^2 ---------  Aorta Value Reference Aortic root ID, ED 24 mm ---------  Left atrium Value Reference LA ID, A-P, ES 47 mm --------- LA ID/bsa, A-P (H) 2.56 cm/m^2 <=2.2 LA volume, ES, 1-p A4C 63.1 ml --------- LA volume/bsa, ES, 1-p A4C 34.4 ml/m^2 --------- LA volume, ES, 1-p A2C 74.5 ml --------- LA volume/bsa, ES, 1-p A2C 40.7 ml/m^2 ---------  Mitral valve Value Reference Mitral E-wave peak velocity 108 cm/s --------- Mitral A-wave peak velocity 30.1 cm/s --------- Mitral deceleration time (L) 134 ms 150 - 230 Mitral peak gradient, D 5 mm Hg --------- Mitral E/A ratio, peak 3.6 ---------  Pulmonary arteries Value Reference PA pressure, S, DP (H) 55 mm Hg <=30  Tricuspid valve Value Reference Tricuspid regurg peak velocity 359 cm/s --------- Tricuspid peak RV-RA gradient 52 mm Hg --------- Tricuspid maximal regurg velocity, 359 cm/s --------- PISA  Systemic veins Value Reference Estimated CVP 3 mm Hg ---------  Right ventricle Value Reference RV pressure, S, DP (H) 55 mm Hg <=30 RV s&', lateral, S 12.1 cm/s ---------  Legend: (L) and (H) mark values outside specified reference range.  ------------------------------------------------------------------- Prepared and Electronically Authenticated by  Ena Dawley, M.D. 2015-09-25T18:41:03   Impression:  She has an 80% distal left main bifurcation stenosis extending into the ostium of the LAD and LCX as well as an eccentric 50% proximal RCA stenosis.  She had a large anteroseptal and apical MI due to LAD occlusion and her EF has not improved despite opening the LAD promptly with PCI. Her LVEF is still in the range of 25-30%. I think CABG is needed to preserve the remaining myocardium. She has been on Brilinta for a month after BMS to the LAD. I discussed the need for Brilinta and bridging around the time of surgery and he feels that we can safely stop the Brilinta a week before surgery and do not need to bridge with another anticoagulant. I discussed the operative procedure with the patient and her husband including alternatives, benefits and risks; including but not limited to bleeding, blood transfusion, infection, stroke, myocardial infarction, graft failure, heart block requiring a permanent pacemaker,  organ dysfunction, and death.  I also discussed the possibility that her heart function will not improve any and she may still need an ICD and may continue to have some heart failure symptoms. Varney Baas Mullinax understands and agrees to proceed.    Plan:  We will schedule CABG for Tuesday 05/01/2014.

## 2014-04-27 ENCOUNTER — Encounter (HOSPITAL_COMMUNITY): Payer: Self-pay

## 2014-04-27 ENCOUNTER — Inpatient Hospital Stay (HOSPITAL_COMMUNITY): Payer: Medicare Other | Admitting: Certified Registered Nurse Anesthetist

## 2014-04-27 ENCOUNTER — Ambulatory Visit (HOSPITAL_COMMUNITY)
Admission: RE | Admit: 2014-04-27 | Discharge: 2014-04-27 | Disposition: A | Discharge status: Home / Self Care | Payer: Medicare Other | Source: Ambulatory Visit | Attending: Internal Medicine | Admitting: Internal Medicine

## 2014-04-27 ENCOUNTER — Ambulatory Visit (HOSPITAL_COMMUNITY)
Admission: RE | Admit: 2014-04-27 | Discharge: 2014-04-27 | Disposition: A | Discharge status: Home / Self Care | Payer: Medicare Other | Source: Ambulatory Visit | Attending: Surgery | Admitting: Surgery

## 2014-04-27 ENCOUNTER — Inpatient Hospital Stay (HOSPITAL_COMMUNITY): Payer: Medicare Other | Admitting: Vascular Surgery

## 2014-04-27 ENCOUNTER — Encounter (HOSPITAL_COMMUNITY)
Admission: RE | Admit: 2014-04-27 | Discharge: 2014-04-27 | Disposition: A | Discharge status: Home / Self Care | Payer: Medicare Other | Source: Ambulatory Visit | Attending: Surgery | Admitting: Surgery

## 2014-04-27 VITALS — BP 120/54 | HR 70 | Temp 97.9°F | Resp 18 | Ht 65.5 in | Wt 147.3 lb

## 2014-04-27 DIAGNOSIS — Z01818 Encounter for other preprocedural examination: Secondary | ICD-10-CM | POA: Diagnosis not present

## 2014-04-27 DIAGNOSIS — I25119 Atherosclerotic heart disease of native coronary artery with unspecified angina pectoris: Secondary | ICD-10-CM

## 2014-04-27 DIAGNOSIS — I5021 Acute systolic (congestive) heart failure: Secondary | ICD-10-CM | POA: Diagnosis not present

## 2014-04-27 DIAGNOSIS — I255 Ischemic cardiomyopathy: Secondary | ICD-10-CM | POA: Insufficient documentation

## 2014-04-27 DIAGNOSIS — E785 Hyperlipidemia, unspecified: Secondary | ICD-10-CM | POA: Diagnosis not present

## 2014-04-27 DIAGNOSIS — Z79899 Other long term (current) drug therapy: Secondary | ICD-10-CM | POA: Insufficient documentation

## 2014-04-27 DIAGNOSIS — I2510 Atherosclerotic heart disease of native coronary artery without angina pectoris: Secondary | ICD-10-CM | POA: Insufficient documentation

## 2014-04-27 DIAGNOSIS — Z01810 Encounter for preprocedural cardiovascular examination: Secondary | ICD-10-CM | POA: Diagnosis not present

## 2014-04-27 DIAGNOSIS — Z5309 Procedure and treatment not carried out because of other contraindication: Secondary | ICD-10-CM | POA: Diagnosis not present

## 2014-04-27 LAB — CBC
HCT: 41.7 % (ref 36.0–46.0)
HEMOGLOBIN: 13.6 g/dL (ref 12.0–15.0)
MCH: 30.8 pg (ref 26.0–34.0)
MCHC: 32.6 g/dL (ref 30.0–36.0)
MCV: 94.6 fL (ref 78.0–100.0)
Platelets: 201 10*3/uL (ref 150–400)
RBC: 4.41 MIL/uL (ref 3.87–5.11)
RDW: 14 % (ref 11.5–15.5)
WBC: 5.4 10*3/uL (ref 4.0–10.5)

## 2014-04-27 LAB — BLOOD GAS, ARTERIAL
Acid-base deficit: 0.4 mmol/L (ref 0.0–2.0)
Bicarbonate: 23.9 mEq/L (ref 20.0–24.0)
Drawn by: 242311
FIO2: 0.21 %
O2 Saturation: 95.4 %
PATIENT TEMPERATURE: 98.6
PCO2 ART: 39.7 mmHg (ref 35.0–45.0)
PH ART: 7.396 (ref 7.350–7.450)
PO2 ART: 81 mmHg (ref 80.0–100.0)
TCO2: 25.1 mmol/L (ref 0–100)

## 2014-04-27 LAB — URINALYSIS, ROUTINE W REFLEX MICROSCOPIC
Glucose, UA: NEGATIVE mg/dL
KETONES UR: NEGATIVE mg/dL
Leukocytes, UA: NEGATIVE
Nitrite: NEGATIVE
Protein, ur: 100 mg/dL — AB
Specific Gravity, Urine: 1.02 (ref 1.005–1.030)
Urobilinogen, UA: 1 mg/dL (ref 0.0–1.0)
pH: 5 (ref 5.0–8.0)

## 2014-04-27 LAB — COMPREHENSIVE METABOLIC PANEL
ALK PHOS: 711 U/L — AB (ref 39–117)
ALT: 1175 U/L — AB (ref 0–35)
AST: 1123 U/L — ABNORMAL HIGH (ref 0–37)
Albumin: 3.4 g/dL — ABNORMAL LOW (ref 3.5–5.2)
Anion gap: 13 (ref 5–15)
BILIRUBIN TOTAL: 1.4 mg/dL — AB (ref 0.3–1.2)
BUN: 8 mg/dL (ref 6–23)
CHLORIDE: 106 meq/L (ref 96–112)
CO2: 22 mEq/L (ref 19–32)
Calcium: 9.9 mg/dL (ref 8.4–10.5)
Creatinine, Ser: 0.79 mg/dL (ref 0.50–1.10)
GFR calc non Af Amer: 83 mL/min — ABNORMAL LOW (ref >90)
GLUCOSE: 126 mg/dL — AB (ref 70–99)
POTASSIUM: 3.6 meq/L — AB (ref 3.7–5.3)
SODIUM: 141 meq/L (ref 137–147)
TOTAL PROTEIN: 7.2 g/dL (ref 6.0–8.3)

## 2014-04-27 LAB — ABO/RH: ABO/RH(D): A NEG

## 2014-04-27 LAB — PROTIME-INR
INR: 1 (ref 0.00–1.49)
PROTHROMBIN TIME: 13.3 s (ref 11.6–15.2)

## 2014-04-27 LAB — SURGICAL PCR SCREEN
MRSA, PCR: NEGATIVE
Staphylococcus aureus: POSITIVE — AB

## 2014-04-27 LAB — URINE MICROSCOPIC-ADD ON

## 2014-04-27 LAB — HEMOGLOBIN A1C
Hgb A1c MFr Bld: 6.1 % — ABNORMAL HIGH (ref <5.7)
Mean Plasma Glucose: 128 mg/dL — ABNORMAL HIGH (ref <117)

## 2014-04-27 LAB — TYPE AND SCREEN
ABO/RH(D): A NEG
ANTIBODY SCREEN: NEGATIVE

## 2014-04-27 LAB — APTT: aPTT: 28 seconds (ref 24–37)

## 2014-04-27 MED ORDER — ALBUTEROL SULFATE (2.5 MG/3ML) 0.083% IN NEBU
2.5000 mg | INHALATION_SOLUTION | Freq: Once | RESPIRATORY_TRACT | Status: AC
  Administered 2014-04-27: 2.5 mg via RESPIRATORY_TRACT

## 2014-04-27 NOTE — Progress Notes (Signed)
Left message for MD re urine results.

## 2014-04-27 NOTE — Progress Notes (Signed)
Pre-op Cardiac Surgery  Carotid Findings:  Right = 40-59% ICA stenosis. Left = 1-39 % ICA stenosis.   Upper Extremity Right Left  Brachial Pressures 126 129  Radial Waveforms Tri Tri  Ulnar Waveforms Tri Tri  Palmar Arch (Allen's Test) Normal Decreases, but not greater than 50%, with radial and ulnar compression   Landry Mellow, RDMS, RVT 04/27/2014

## 2014-04-27 NOTE — Progress Notes (Signed)
Pt has external life vest on .Kelli Roy

## 2014-04-27 NOTE — Pre-Procedure Instructions (Signed)
Tamarah Bhullar Kaley  04/27/2014   Your procedure is scheduled on:  05/01/14  Report to [facility] Admitting at 530 AM.  Call this number if you have problems the morning of surgery: [phone]   Remember:   Do not eat food or drink liquids after midnight.   Take these medicines the morning of surgery with A SIP OF WATER: carvedilol,imdur   Do not wear jewelry, make-up or nail polish.  Do not wear lotions, powders, or perfumes. You may wear deodorant.  Do not shave 48 hours prior to surgery. Men may shave face and neck.  Do not bring valuables to the hospital.  [facility] is not responsible                  for any belongings or valuables.               Contacts, dentures or bridgework may not be worn into surgery.  Leave suitcase in the car. After surgery it may be brought to your room.  For patients admitted to the hospital, discharge time is determined by your                treatment team.               Patients discharged the day of surgery will not be allowed to drive  home.  Name and phone number of your driver: family  Special Instructions: Shower using CHG 2 nights before surgery and the night before surgery.  If you shower the day of surgery use CHG.  Use special wash - you have one bottle of CHG for all showers.  You should use approximately 1/3 of the bottle for each shower.   Please read over the following fact sheets that you were given: Pain Booklet, Coughing and Deep Breathing, Blood Transfusion Information, MRSA Information and Surgical Site Infection Prevention

## 2014-04-28 LAB — PULMONARY FUNCTION TEST
DL/VA % pred: 86 %
DL/VA: 4.18 ml/min/mmHg/L
DLCO COR % PRED: 61 %
DLCO UNC: 15.2 ml/min/mmHg
DLCO cor: 15.02 ml/min/mmHg
DLCO unc % pred: 62 %
FEF 25-75 POST: 1.64 L/s
FEF 25-75 Pre: 2.44 L/sec
FEF2575-%Change-Post: -32 %
FEF2575-%PRED-POST: 84 %
FEF2575-%Pred-Pre: 125 %
FEV1-%Change-Post: -7 %
FEV1-%Pred-Post: 82 %
FEV1-%Pred-Pre: 88 %
FEV1-POST: 1.89 L
FEV1-PRE: 2.03 L
FEV1FVC-%CHANGE-POST: 2 %
FEV1FVC-%Pred-Pre: 109 %
FEV6-%Change-Post: -9 %
FEV6-%Pred-Post: 76 %
FEV6-%Pred-Pre: 84 %
FEV6-Post: 2.21 L
FEV6-Pre: 2.44 L
FEV6FVC-%Pred-Post: 105 %
FEV6FVC-%Pred-Pre: 105 %
FVC-%CHANGE-POST: -9 %
FVC-%PRED-POST: 73 %
FVC-%Pred-Pre: 80 %
FVC-Post: 2.21 L
FVC-Pre: 2.44 L
PRE FEV1/FVC RATIO: 83 %
Post FEV1/FVC ratio: 85 %
Post FEV6/FVC ratio: 100 %
Pre FEV6/FVC Ratio: 100 %
RV % PRED: 72 %
RV: 1.57 L
TLC % pred: 81 %
TLC: 4.09 L

## 2014-04-30 MED ORDER — PHENYLEPHRINE HCL 10 MG/ML IJ SOLN
30.0000 ug/min | INTRAMUSCULAR | Status: DC
  Filled 2014-04-30: qty 2

## 2014-04-30 MED ORDER — PLASMA-LYTE 148 IV SOLN
INTRAVENOUS | Status: DC
  Filled 2014-04-30: qty 2.5

## 2014-04-30 MED ORDER — SODIUM CHLORIDE 0.9 % IV SOLN
INTRAVENOUS | Status: DC
  Filled 2014-04-30: qty 2.5

## 2014-04-30 MED ORDER — CEFUROXIME SODIUM 750 MG IJ SOLR
750.0000 mg | INTRAMUSCULAR | Status: DC
  Filled 2014-04-30: qty 750

## 2014-04-30 MED ORDER — EPINEPHRINE HCL 1 MG/ML IJ SOLN
0.5000 ug/min | INTRAVENOUS | Status: DC
  Filled 2014-04-30: qty 4

## 2014-04-30 MED ORDER — DOPAMINE-DEXTROSE 3.2-5 MG/ML-% IV SOLN
0.0000 ug/kg/min | INTRAVENOUS | Status: DC
  Filled 2014-04-30: qty 250

## 2014-04-30 MED ORDER — MAGNESIUM SULFATE 50 % IJ SOLN
40.0000 meq | INTRAMUSCULAR | Status: DC
  Filled 2014-04-30: qty 10

## 2014-04-30 MED ORDER — DEXMEDETOMIDINE HCL IN NACL 400 MCG/100ML IV SOLN
0.1000 ug/kg/h | INTRAVENOUS | Status: DC
  Filled 2014-04-30: qty 100

## 2014-04-30 MED ORDER — METOPROLOL TARTRATE 12.5 MG HALF TABLET: 12.5000 mg | ORAL_TABLET | Freq: Once | ORAL | Status: DC

## 2014-04-30 MED ORDER — SODIUM CHLORIDE 0.9 % IV SOLN
INTRAVENOUS | Status: DC
  Filled 2014-04-30: qty 40

## 2014-04-30 MED ORDER — SODIUM CHLORIDE 0.9 % IV SOLN
INTRAVENOUS | Status: DC
  Filled 2014-04-30: qty 30

## 2014-04-30 MED ORDER — VANCOMYCIN HCL 10 G IV SOLR
1250.0000 mg | INTRAVENOUS | Status: DC
  Filled 2014-04-30: qty 1250

## 2014-04-30 MED ORDER — NITROGLYCERIN IN D5W 200-5 MCG/ML-% IV SOLN
2.0000 ug/min | INTRAVENOUS | Status: DC
  Filled 2014-04-30: qty 250

## 2014-04-30 MED ORDER — DEXTROSE 5 % IV SOLN
1.5000 g | INTRAVENOUS | Status: DC
  Filled 2014-04-30: qty 1.5

## 2014-04-30 MED ORDER — POTASSIUM CHLORIDE 2 MEQ/ML IV SOLN
80.0000 meq | INTRAVENOUS | Status: DC
  Filled 2014-04-30: qty 40

## 2014-05-01 ENCOUNTER — Encounter (HOSPITAL_COMMUNITY)
Admission: RE | Disposition: A | Discharge status: Home / Self Care | Payer: Self-pay | Source: Ambulatory Visit | Attending: Surgery

## 2014-05-01 ENCOUNTER — Other Ambulatory Visit: Payer: Self-pay | Admitting: *Deleted

## 2014-05-01 ENCOUNTER — Encounter (HOSPITAL_COMMUNITY): Payer: Self-pay | Admitting: Surgery

## 2014-05-01 ENCOUNTER — Ambulatory Visit (HOSPITAL_COMMUNITY)
Admission: RE | Admit: 2014-05-01 | Discharge: 2014-05-01 | Disposition: A | Discharge status: Home / Self Care | Payer: Medicare Other | Source: Ambulatory Visit | Attending: Surgery | Admitting: Surgery

## 2014-05-01 DIAGNOSIS — I25119 Atherosclerotic heart disease of native coronary artery with unspecified angina pectoris: Secondary | ICD-10-CM

## 2014-05-01 DIAGNOSIS — I2510 Atherosclerotic heart disease of native coronary artery without angina pectoris: Secondary | ICD-10-CM

## 2014-05-01 DIAGNOSIS — Z5309 Procedure and treatment not carried out because of other contraindication: Secondary | ICD-10-CM | POA: Diagnosis not present

## 2014-05-01 SURGERY — CORONARY ARTERY BYPASS GRAFTING (CABG)
Anesthesia: General

## 2014-05-01 MED ORDER — FENTANYL CITRATE 0.05 MG/ML IJ SOLN
INTRAMUSCULAR | Status: AC
  Filled 2014-05-01: qty 5

## 2014-05-01 MED ORDER — HEPARIN SODIUM (PORCINE) 1000 UNIT/ML IJ SOLN
INTRAMUSCULAR | Status: AC
  Filled 2014-05-01: qty 1

## 2014-05-01 MED ORDER — PROPOFOL 10 MG/ML IV BOLUS
INTRAVENOUS | Status: AC
  Filled 2014-05-01: qty 20

## 2014-05-01 MED ORDER — CHLORHEXIDINE GLUCONATE 4 % EX LIQD
30.0000 mL | CUTANEOUS | Status: DC
  Filled 2014-05-01: qty 30

## 2014-05-01 MED ORDER — ESMOLOL HCL 10 MG/ML IV SOLN
INTRAVENOUS | Status: AC
  Filled 2014-05-01: qty 10

## 2014-05-01 MED ORDER — EPHEDRINE SULFATE 50 MG/ML IJ SOLN
INTRAMUSCULAR | Status: AC
  Filled 2014-05-01: qty 1

## 2014-05-01 MED ORDER — SODIUM CHLORIDE 0.9 % IJ SOLN
INTRAMUSCULAR | Status: AC
  Filled 2014-05-01: qty 10

## 2014-05-01 MED ORDER — MUPIROCIN 2 % EX OINT: 1.0000 "application " | TOPICAL_OINTMENT | Freq: Once | CUTANEOUS | Status: DC

## 2014-05-01 MED ORDER — FENTANYL CITRATE 0.05 MG/ML IJ SOLN
INTRAMUSCULAR | Status: AC
  Filled 2014-05-01: qty 2

## 2014-05-01 MED ORDER — MIDAZOLAM HCL 2 MG/2ML IJ SOLN
INTRAMUSCULAR | Status: AC
  Filled 2014-05-01: qty 2

## 2014-05-01 MED ORDER — FENTANYL CITRATE 0.05 MG/ML IJ SOLN
INTRAMUSCULAR | Status: DC | PRN
  Administered 2014-05-01: 100 ug via INTRAVENOUS

## 2014-05-01 MED ORDER — SUCCINYLCHOLINE CHLORIDE 20 MG/ML IJ SOLN
INTRAMUSCULAR | Status: AC
  Filled 2014-05-01: qty 1

## 2014-05-01 MED ORDER — MIDAZOLAM HCL 10 MG/2ML IJ SOLN
INTRAMUSCULAR | Status: AC
  Filled 2014-05-01: qty 2

## 2014-05-01 MED ORDER — MIDAZOLAM HCL 5 MG/5ML IJ SOLN
INTRAMUSCULAR | Status: DC | PRN
  Administered 2014-05-01: 2 mg via INTRAVENOUS

## 2014-05-01 MED ORDER — ROCURONIUM BROMIDE 50 MG/5ML IV SOLN
INTRAVENOUS | Status: AC
  Filled 2014-05-01: qty 2

## 2014-05-01 SURGICAL SUPPLY — 97 items
ATTRACTOMAT 16X20 MAGNETIC DRP (DRAPES) ×5 IMPLANT
BAG DECANTER FOR FLEXI CONT (MISCELLANEOUS) ×5 IMPLANT
BANDAGE ELASTIC 4 VELCRO ST LF (GAUZE/BANDAGES/DRESSINGS) ×5 IMPLANT
BANDAGE ELASTIC 6 VELCRO ST LF (GAUZE/BANDAGES/DRESSINGS) ×5 IMPLANT
BASKET HEART  (ORDER IN 25'S) (MISCELLANEOUS) ×1
BASKET HEART (ORDER IN 25'S) (MISCELLANEOUS) ×1
BASKET HEART (ORDER IN 25S) (MISCELLANEOUS) ×3 IMPLANT
BLADE STERNUM SYSTEM 6 (BLADE) ×5 IMPLANT
BNDG GAUZE ELAST 4 BULKY (GAUZE/BANDAGES/DRESSINGS) ×5 IMPLANT
CANISTER SUCTION 2500CC (MISCELLANEOUS) ×5 IMPLANT
CARDIAC SUCTION (MISCELLANEOUS) ×5 IMPLANT
CATH ROBINSON RED A/P 18FR (CATHETERS) ×10 IMPLANT
CATH THORACIC 28FR (CATHETERS) ×5 IMPLANT
CATH THORACIC 36FR (CATHETERS) ×5 IMPLANT
CATH THORACIC 36FR RT ANG (CATHETERS) ×5 IMPLANT
CLIP TI MEDIUM 24 (CLIP) IMPLANT
CLIP TI WIDE RED SMALL 24 (CLIP) IMPLANT
COVER SURGICAL LIGHT HANDLE (MISCELLANEOUS) ×5 IMPLANT
CRADLE DONUT ADULT HEAD (MISCELLANEOUS) ×5 IMPLANT
DRAPE CARDIOVASCULAR INCISE (DRAPES) ×4
DRAPE SLUSH/WARMER DISC (DRAPES) ×5 IMPLANT
DRAPE SRG 135X102X78XABS (DRAPES) ×3 IMPLANT
DRSG COVADERM 4X14 (GAUZE/BANDAGES/DRESSINGS) ×5 IMPLANT
ELECT CAUTERY BLADE 6.4 (BLADE) ×5 IMPLANT
ELECT REM PT RETURN 9FT ADLT (ELECTROSURGICAL) ×8
ELECTRODE REM PT RTRN 9FT ADLT (ELECTROSURGICAL) ×6 IMPLANT
GAUZE SPONGE 4X4 12PLY STRL (GAUZE/BANDAGES/DRESSINGS) ×10 IMPLANT
GLOVE BIO SURGEON STRL SZ 6 (GLOVE) IMPLANT
GLOVE BIO SURGEON STRL SZ 6.5 (GLOVE) IMPLANT
GLOVE BIO SURGEON STRL SZ7 (GLOVE) IMPLANT
GLOVE BIO SURGEON STRL SZ7.5 (GLOVE) IMPLANT
GLOVE BIO SURGEONS STRL SZ 6.5 (GLOVE)
GLOVE BIOGEL PI IND STRL 6 (GLOVE) IMPLANT
GLOVE BIOGEL PI IND STRL 6.5 (GLOVE) IMPLANT
GLOVE BIOGEL PI IND STRL 7.0 (GLOVE) IMPLANT
GLOVE BIOGEL PI INDICATOR 6 (GLOVE)
GLOVE BIOGEL PI INDICATOR 6.5 (GLOVE)
GLOVE BIOGEL PI INDICATOR 7.0 (GLOVE)
GLOVE EUDERMIC 7 POWDERFREE (GLOVE) ×10 IMPLANT
GLOVE ORTHO TXT STRL SZ7.5 (GLOVE) IMPLANT
GOWN STRL REUS W/ TWL LRG LVL3 (GOWN DISPOSABLE) ×12 IMPLANT
GOWN STRL REUS W/ TWL XL LVL3 (GOWN DISPOSABLE) ×3 IMPLANT
GOWN STRL REUS W/TWL LRG LVL3 (GOWN DISPOSABLE) ×16
GOWN STRL REUS W/TWL XL LVL3 (GOWN DISPOSABLE) ×4
HEMOSTAT POWDER SURGIFOAM 1G (HEMOSTASIS) ×15 IMPLANT
HEMOSTAT SURGICEL 2X14 (HEMOSTASIS) ×5 IMPLANT
INSERT FOGARTY 61MM (MISCELLANEOUS) IMPLANT
INSERT FOGARTY XLG (MISCELLANEOUS) IMPLANT
KIT BASIN OR (CUSTOM PROCEDURE TRAY) ×5 IMPLANT
KIT CATH CPB BARTLE (MISCELLANEOUS) ×5 IMPLANT
KIT ROOM TURNOVER OR (KITS) ×5 IMPLANT
KIT SUCTION CATH 14FR (SUCTIONS) ×5 IMPLANT
KIT VASOVIEW W/TROCAR VH 2000 (KITS) ×5 IMPLANT
NS IRRIG 1000ML POUR BTL (IV SOLUTION) ×25 IMPLANT
PACK OPEN HEART (CUSTOM PROCEDURE TRAY) ×5 IMPLANT
PAD ARMBOARD 7.5X6 YLW CONV (MISCELLANEOUS) ×10 IMPLANT
PAD ELECT DEFIB RADIOL ZOLL (MISCELLANEOUS) ×5 IMPLANT
PENCIL BUTTON HOLSTER BLD 10FT (ELECTRODE) ×5 IMPLANT
PUNCH AORTIC ROTATE 4.0MM (MISCELLANEOUS) IMPLANT
PUNCH AORTIC ROTATE 4.5MM 8IN (MISCELLANEOUS) ×5 IMPLANT
PUNCH AORTIC ROTATE 5MM 8IN (MISCELLANEOUS) IMPLANT
SPONGE INTESTINAL PEANUT (DISPOSABLE) IMPLANT
SPONGE LAP 18X18 X RAY DECT (DISPOSABLE) IMPLANT
SPONGE LAP 4X18 X RAY DECT (DISPOSABLE) ×5 IMPLANT
SUT BONE WAX W31G (SUTURE) ×5 IMPLANT
SUT MNCRL AB 4-0 PS2 18 (SUTURE) IMPLANT
SUT PROLENE 3 0 SH DA (SUTURE) IMPLANT
SUT PROLENE 3 0 SH1 36 (SUTURE) ×5 IMPLANT
SUT PROLENE 4 0 RB 1 (SUTURE)
SUT PROLENE 4 0 SH DA (SUTURE) IMPLANT
SUT PROLENE 4-0 RB1 .5 CRCL 36 (SUTURE) IMPLANT
SUT PROLENE 5 0 C 1 36 (SUTURE) IMPLANT
SUT PROLENE 6 0 C 1 30 (SUTURE) IMPLANT
SUT PROLENE 7 0 BV 1 (SUTURE) IMPLANT
SUT PROLENE 7 0 BV1 MDA (SUTURE) ×5 IMPLANT
SUT PROLENE 8 0 BV175 6 (SUTURE) IMPLANT
SUT SILK  1 MH (SUTURE)
SUT SILK 1 MH (SUTURE) IMPLANT
SUT STEEL STERNAL CCS#1 18IN (SUTURE) IMPLANT
SUT STEEL SZ 6 DBL 3X14 BALL (SUTURE) IMPLANT
SUT VIC AB 1 CTX 36 (SUTURE) ×8
SUT VIC AB 1 CTX36XBRD ANBCTR (SUTURE) ×6 IMPLANT
SUT VIC AB 2-0 CT1 27 (SUTURE)
SUT VIC AB 2-0 CT1 TAPERPNT 27 (SUTURE) IMPLANT
SUT VIC AB 2-0 CTX 27 (SUTURE) IMPLANT
SUT VIC AB 3-0 SH 27 (SUTURE)
SUT VIC AB 3-0 SH 27X BRD (SUTURE) IMPLANT
SUT VIC AB 3-0 X1 27 (SUTURE) IMPLANT
SUT VICRYL 4-0 PS2 18IN ABS (SUTURE) IMPLANT
SUTURE E-PAK OPEN HEART (SUTURE) ×5 IMPLANT
SYSTEM SAHARA CHEST DRAIN ATS (WOUND CARE) ×5 IMPLANT
TOWEL OR 17X24 6PK STRL BLUE (TOWEL DISPOSABLE) ×5 IMPLANT
TOWEL OR 17X26 10 PK STRL BLUE (TOWEL DISPOSABLE) ×5 IMPLANT
TRAY FOLEY IC TEMP SENS 16FR (CATHETERS) ×5 IMPLANT
TUBING INSUFFLATION (TUBING) ×5 IMPLANT
UNDERPAD 30X30 INCONTINENT (UNDERPADS AND DIAPERS) ×5 IMPLANT
WATER STERILE IRR 1000ML POUR (IV SOLUTION) ×10 IMPLANT

## 2014-05-01 NOTE — Progress Notes (Signed)
Called by anesthesia this am that LFT's were markedly elevated. These were done on 10/23 and the AST was 1123, ALT 1175, Alk Phos 711, and TB 1.4. I was not called by the lab about these markedly abnormal values. This is most likely due to the lipitor she was put on after her MI. Her last LFT's on 03/08/2014 were normal. Her surgery has been postponed and I will have my office call her to schedule follow up LFT's in a week. I told her to resume the Brilinta and the LifeVest. I will also hold the Losartan since this can occasionally cause hepatitis although I think the Lipitor is the more likely cause.

## 2014-05-01 NOTE — Progress Notes (Signed)
Pt voices understanding of instructions given by Dr Cyndia Bent. Encouraged pt and her husband to call Dr Vivi Martens office with any questions. IV's removed and pt getting dressed to go home.

## 2014-05-07 ENCOUNTER — Telehealth: Payer: Self-pay | Admitting: Nurse Practitioner

## 2014-05-07 LAB — HEPATIC FUNCTION PANEL
ALBUMIN: 3.5 g/dL (ref 3.5–5.2)
ALT: 445 U/L — ABNORMAL HIGH (ref 0–35)
AST: 237 U/L — ABNORMAL HIGH (ref 0–37)
Alkaline Phosphatase: 465 U/L — ABNORMAL HIGH (ref 39–117)
Bilirubin, Direct: 0.3 mg/dL (ref 0.0–0.3)
Indirect Bilirubin: 0.9 mg/dL (ref 0.2–1.2)
TOTAL PROTEIN: 7.2 g/dL (ref 6.0–8.3)
Total Bilirubin: 1.2 mg/dL (ref 0.3–1.2)

## 2014-05-07 NOTE — Telephone Encounter (Signed)
New message     Pt is scheduled to see Cecille Rubin on 05-18-14 for bypass follow up.  Her surgery was cancelled because of abn labs.  Should she still keep her appt with Cecille Rubin?

## 2014-05-07 NOTE — Telephone Encounter (Signed)
Spoke with patient and advised her that f/u appointment will not be needed on 11/13, that it will be needed approximately 4 weeks after surgery.  I advised patient that appointment should be made before she is discharged from the hospital. Patient states she had repeat lab work today.    I spoke with Truitt Merle, NP and updated her on the situation and advised patient that per Cecille Rubin, she can f/u with her post-surgery and to call our office and ask for Andee Poles if she has problems getting an appointment.  Patient verbalized understanding and gratitude.

## 2014-05-08 ENCOUNTER — Other Ambulatory Visit: Payer: Self-pay | Admitting: *Deleted

## 2014-05-08 DIAGNOSIS — I2510 Atherosclerotic heart disease of native coronary artery without angina pectoris: Secondary | ICD-10-CM

## 2014-05-17 ENCOUNTER — Ambulatory Visit (HOSPITAL_COMMUNITY)
Admission: RE | Admit: 2014-05-17 | Discharge: 2014-05-17 | Disposition: A | Discharge status: Home / Self Care | Payer: Medicare Other | Source: Ambulatory Visit | Attending: Surgery | Admitting: Surgery

## 2014-05-17 ENCOUNTER — Encounter (HOSPITAL_COMMUNITY)
Admission: RE | Admit: 2014-05-17 | Discharge: 2014-05-17 | Disposition: A | Discharge status: Home / Self Care | Payer: Medicare Other | Source: Ambulatory Visit | Attending: Surgery | Admitting: Surgery

## 2014-05-17 ENCOUNTER — Encounter (HOSPITAL_COMMUNITY): Payer: Self-pay

## 2014-05-17 VITALS — BP 131/63 | HR 58 | Temp 98.4°F | Resp 18 | Ht 65.5 in | Wt 148.1 lb

## 2014-05-17 DIAGNOSIS — I2510 Atherosclerotic heart disease of native coronary artery without angina pectoris: Secondary | ICD-10-CM | POA: Diagnosis not present

## 2014-05-17 DIAGNOSIS — I255 Ischemic cardiomyopathy: Secondary | ICD-10-CM | POA: Diagnosis not present

## 2014-05-17 DIAGNOSIS — Z79899 Other long term (current) drug therapy: Secondary | ICD-10-CM | POA: Insufficient documentation

## 2014-05-17 DIAGNOSIS — E785 Hyperlipidemia, unspecified: Secondary | ICD-10-CM | POA: Diagnosis not present

## 2014-05-17 DIAGNOSIS — I10 Essential (primary) hypertension: Secondary | ICD-10-CM | POA: Insufficient documentation

## 2014-05-17 DIAGNOSIS — Z01818 Encounter for other preprocedural examination: Secondary | ICD-10-CM | POA: Insufficient documentation

## 2014-05-17 LAB — URINALYSIS, ROUTINE W REFLEX MICROSCOPIC
BILIRUBIN URINE: NEGATIVE
GLUCOSE, UA: NEGATIVE mg/dL
HGB URINE DIPSTICK: NEGATIVE
Ketones, ur: NEGATIVE mg/dL
Leukocytes, UA: NEGATIVE
Nitrite: NEGATIVE
PROTEIN: NEGATIVE mg/dL
Specific Gravity, Urine: 1.02 (ref 1.005–1.030)
Urobilinogen, UA: 0.2 mg/dL (ref 0.0–1.0)
pH: 5 (ref 5.0–8.0)

## 2014-05-17 LAB — CBC
HCT: 41.3 % (ref 36.0–46.0)
Hemoglobin: 13.3 g/dL (ref 12.0–15.0)
MCH: 30.6 pg (ref 26.0–34.0)
MCHC: 32.2 g/dL (ref 30.0–36.0)
MCV: 95.2 fL (ref 78.0–100.0)
Platelets: 180 10*3/uL (ref 150–400)
RBC: 4.34 MIL/uL (ref 3.87–5.11)
RDW: 14.2 % (ref 11.5–15.5)
WBC: 5.2 10*3/uL (ref 4.0–10.5)

## 2014-05-17 LAB — PROTIME-INR
INR: 1.09 (ref 0.00–1.49)
PROTHROMBIN TIME: 14.3 s (ref 11.6–15.2)

## 2014-05-17 LAB — TYPE AND SCREEN
ABO/RH(D): A NEG
Antibody Screen: NEGATIVE

## 2014-05-17 LAB — COMPREHENSIVE METABOLIC PANEL
ALT: 198 U/L — ABNORMAL HIGH (ref 0–35)
AST: 93 U/L — ABNORMAL HIGH (ref 0–37)
Albumin: 3.5 g/dL (ref 3.5–5.2)
Alkaline Phosphatase: 243 U/L — ABNORMAL HIGH (ref 39–117)
Anion gap: 14 (ref 5–15)
BUN: 11 mg/dL (ref 6–23)
CALCIUM: 9.9 mg/dL (ref 8.4–10.5)
CO2: 19 mEq/L (ref 19–32)
CREATININE: 0.67 mg/dL (ref 0.50–1.10)
Chloride: 105 mEq/L (ref 96–112)
GFR calc Af Amer: >90 mL/min (ref >90)
GFR, EST NON AFRICAN AMERICAN: 88 mL/min — AB (ref >90)
Glucose, Bld: 97 mg/dL (ref 70–99)
Potassium: 4.4 mEq/L (ref 3.7–5.3)
Sodium: 138 mEq/L (ref 137–147)
TOTAL PROTEIN: 6.7 g/dL (ref 6.0–8.3)
Total Bilirubin: 0.9 mg/dL (ref 0.3–1.2)

## 2014-05-17 LAB — BLOOD GAS, ARTERIAL
ACID-BASE DEFICIT: 2.3 mmol/L — AB (ref 0.0–2.0)
Bicarbonate: 21.9 mEq/L (ref 20.0–24.0)
Drawn by: 206361
FIO2: 0.21 %
O2 SAT: 94 %
PCO2 ART: 37.3 mmHg (ref 35.0–45.0)
Patient temperature: 98.6
TCO2: 23 mmol/L (ref 0–100)
pH, Arterial: 7.386 (ref 7.350–7.450)
pO2, Arterial: 73.9 mmHg — ABNORMAL LOW (ref 80.0–100.0)

## 2014-05-17 LAB — HEMOGLOBIN A1C
HEMOGLOBIN A1C: 6.1 % — AB (ref <5.7)
MEAN PLASMA GLUCOSE: 128 mg/dL — AB (ref <117)

## 2014-05-17 LAB — APTT: aPTT: 27 seconds (ref 24–37)

## 2014-05-17 NOTE — Pre-Procedure Instructions (Signed)
Kelli Roy  05/17/2014   Your procedure is scheduled on:  Mon, Nov 16 @ 7:30 AM  Report to [facility]  at 5:30 AM.  Call this number if you have problems the morning of surgery: [phone]   Remember:   Do not eat food or drink liquids after midnight.   Take these medicines the morning of surgery with A SIP OF WATER: Carvedilol(Coreg) and Imdur(Isosorbide)              No Goody's,BC's,Aleve,Ibuprofen,Fish Oil,or any Herbal Medications   Do not wear jewelry, make-up or nail polish.  Do not wear lotions, powders, or perfumes.   Do not shave 48 hours prior to surgery.   Do not bring valuables to the hospital.  [facility] is not responsible                  for any belongings or valuables.               Contacts, dentures or bridgework may not be worn into surgery.  Leave suitcase in the car. After surgery it may be brought to your room.  For patients admitted to the hospital, discharge time is determined by your                treatment team.                   Special Instructions:  [facility] - Preparing for Surgery  Before surgery, you can play an important role.  Because skin is not sterile, your skin needs to be as free of germs as possible.  You can reduce the number of germs on you skin by washing with CHG (chlorahexidine gluconate) soap before surgery.  CHG is an antiseptic cleaner which kills germs and bonds with the skin to continue killing germs even after washing.  Please DO NOT use if you have an allergy to CHG or antibacterial soaps.  If your skin becomes reddened/irritated stop using the CHG and inform your nurse when you arrive at Short Stay.  Do not shave (including legs and underarms) for at least 48 hours prior to the first CHG shower.  You may shave your face.  Please follow these instructions carefully:   1.  Shower with CHG Soap the night before surgery and the                                morning of Surgery.  2.  If you choose to wash your  hair, wash your hair first as usual with your       normal shampoo.  3.  After you shampoo, rinse your hair and body thoroughly to remove the                      Shampoo.  4.  Use CHG as you would any other liquid soap.  You can apply chg directly       to the skin and wash gently with scrungie or a clean washcloth.  5.  Apply the CHG Soap to your body ONLY FROM THE NECK DOWN.        Do not use on open wounds or open sores.  Avoid contact with your eyes,       ears, mouth and genitals (private parts).  Wash genitals (private parts)       with your normal soap.  6.  Wash thoroughly, paying special attention to the area where your surgery        will be performed.  7.  Thoroughly rinse your body with warm water from the neck down.  8.  DO NOT shower/wash with your normal soap after using and rinsing off       the CHG Soap.  9.  Pat yourself dry with a clean towel.            10.  Wear clean pajamas.            11.  Place clean sheets on your bed the night of your first shower and do not        sleep with pets.  Day of Surgery  Do not apply any lotions/deoderants the morning of surgery.  Please wear clean clothes to the hospital/surgery center.     Please read over the following fact sheets that you were given: Pain Booklet, Coughing and Deep Breathing, Blood Transfusion Information and Surgical Site Infection Prevention

## 2014-05-17 NOTE — Pre-Procedure Instructions (Signed)
Kelli Roy  05/17/2014   Your procedure is scheduled on:  Mon, November 16 at 7:30 AM.   Report to [facility] Entr[facility]" Admitting Office at 5:30 AM.   Call this number if you have problems the morning of surgery: [phone]               Any questions prior to day of surgery, please call [phone].   Remember:   Do not eat food or drink liquids after midnight.   Take these medicines the morning of surgery with A SIP OF WATER: Carvedilol(Coreg) and Imdur(Isosorbide)              No Goody's,BC's,Aleve,Ibuprofen,Fish Oil,or any Herbal Medications   Do not wear jewelry, make-up or nail polish.  Do not wear lotions, powders, or perfumes. Do NOT wear deodorant  Do not shave 48 hours prior to surgery.   Do not bring valuables to the hospital.  [facility] is not responsible                  for any belongings or valuables.               Contacts, dentures or bridgework may not be worn into surgery.  Leave suitcase in the car. After surgery it may be brought to your room.  For patients admitted to the hospital, discharge time is determined by your                treatment team.                   Special Instructions:  [place] - Preparing for Surgery  Before surgery, you can play an important role.  Because skin is not sterile, your skin needs to be as free of germs as possible.  You can reduce the number of germs on you skin by washing with CHG (chlorahexidine gluconate) soap before surgery.  CHG is an antiseptic cleaner which kills germs and bonds with the skin to continue killing germs even after washing.  Please DO NOT use if you have an allergy to CHG or antibacterial soaps.  If your skin becomes reddened/irritated stop using the CHG and inform your nurse when you arrive at Short Stay.  Do not shave (including legs and underarms) for at least 48 hours prior to the first CHG shower.  You may shave your face.  Please follow these instructions carefully:   1.   Shower with CHG Soap the night before surgery and the                                morning of Surgery.  2.  If you choose to wash your hair, wash your hair first as usual with your       normal shampoo.  3.  After you shampoo, rinse your hair and body thoroughly to remove the                      Shampoo.  4.  Use CHG as you would any other liquid soap.  You can apply chg directly       to the skin and wash gently with scrungie or a clean washcloth.  5.  Apply the CHG Soap to your body ONLY FROM THE NECK DOWN.        Do not use on open wounds or open sores.  Avoid contact  with your eyes, ears, mouth and genitals (private parts).  Wash genitals (private parts) with your normal soap.  6.  Wash thoroughly, paying special attention to the area where your surgery        will be performed.  7.  Thoroughly rinse your body with warm water from the neck down.  8.  DO NOT shower/wash with your normal soap after using and rinsing off       the CHG Soap.  9.  Pat yourself dry with a clean towel.            10.  Wear clean pajamas.            11.  Place clean sheets on your bed the night of your first shower and do not        sleep with pets.  Day of Surgery  Do not apply any lotions/deodorants the morning of surgery.  Please wear clean clothes to the hospital.   Please read over the following fact sheets that you were given: Pain Booklet, Coughing and Deep Breathing, Blood Transfusion Information, MRSA Information and Surgical Site Infection Prevention

## 2014-05-18 ENCOUNTER — Ambulatory Visit: Payer: Medicare Other | Admitting: Nurse Practitioner

## 2014-05-20 MED ORDER — SODIUM CHLORIDE 0.9 % IV SOLN
INTRAVENOUS | Status: AC
  Administered 2014-05-21: 1 [IU]/h via INTRAVENOUS
  Filled 2014-05-20: qty 2.5

## 2014-05-20 MED ORDER — PLASMA-LYTE 148 IV SOLN
INTRAVENOUS | Status: AC
  Administered 2014-05-21: 500 mL
  Filled 2014-05-20: qty 2.5

## 2014-05-20 MED ORDER — DEXTROSE 5 % IV SOLN
1.5000 g | INTRAVENOUS | Status: AC
  Administered 2014-05-21: 1.5 g via INTRAVENOUS
  Administered 2014-05-21: .75 g via INTRAVENOUS
  Filled 2014-05-20: qty 1.5

## 2014-05-20 MED ORDER — POTASSIUM CHLORIDE 2 MEQ/ML IV SOLN
80.0000 meq | INTRAVENOUS | Status: DC
  Filled 2014-05-20: qty 40

## 2014-05-20 MED ORDER — NITROGLYCERIN IN D5W 200-5 MCG/ML-% IV SOLN
2.0000 ug/min | INTRAVENOUS | Status: AC
  Administered 2014-05-21: 5 ug/min via INTRAVENOUS
  Filled 2014-05-20: qty 250

## 2014-05-20 MED ORDER — PHENYLEPHRINE HCL 10 MG/ML IJ SOLN
30.0000 ug/min | INTRAVENOUS | Status: DC
  Filled 2014-05-20: qty 2

## 2014-05-20 MED ORDER — EPINEPHRINE HCL 1 MG/ML IJ SOLN
0.0000 ug/min | INTRAVENOUS | Status: DC
  Filled 2014-05-20: qty 4

## 2014-05-20 MED ORDER — DEXMEDETOMIDINE HCL IN NACL 400 MCG/100ML IV SOLN
0.1000 ug/kg/h | INTRAVENOUS | Status: AC
  Administered 2014-05-21: 0.3 ug/kg/h via INTRAVENOUS
  Filled 2014-05-20: qty 100

## 2014-05-20 MED ORDER — VANCOMYCIN HCL 1000 MG IV SOLR
INTRAVENOUS | Status: DC
  Filled 2014-05-20: qty 1000

## 2014-05-20 MED ORDER — METOPROLOL TARTRATE 12.5 MG HALF TABLET: 12.5000 mg | ORAL_TABLET | Freq: Once | ORAL | Status: DC

## 2014-05-20 MED ORDER — SODIUM CHLORIDE 0.9 % IV SOLN
INTRAVENOUS | Status: DC
  Filled 2014-05-20: qty 40

## 2014-05-20 MED ORDER — MAGNESIUM SULFATE 50 % IJ SOLN
40.0000 meq | INTRAMUSCULAR | Status: DC
  Filled 2014-05-20: qty 10

## 2014-05-20 MED ORDER — DOPAMINE-DEXTROSE 3.2-5 MG/ML-% IV SOLN
0.0000 ug/kg/min | INTRAVENOUS | Status: DC
  Filled 2014-05-20: qty 250

## 2014-05-20 MED ORDER — VANCOMYCIN HCL 10 G IV SOLR
1250.0000 mg | INTRAVENOUS | Status: AC
  Administered 2014-05-21: 1250 mg via INTRAVENOUS
  Filled 2014-05-20 (×2): qty 1250

## 2014-05-20 MED ORDER — SODIUM CHLORIDE 0.9 % IV SOLN
INTRAVENOUS | Status: DC
  Filled 2014-05-20: qty 30

## 2014-05-20 MED ORDER — DEXTROSE 5 % IV SOLN
750.0000 mg | INTRAVENOUS | Status: DC
  Filled 2014-05-20: qty 750

## 2014-05-21 ENCOUNTER — Inpatient Hospital Stay (HOSPITAL_COMMUNITY)
Admission: RE | Admit: 2014-05-21 | Discharge: 2014-05-25 | DRG: 236 | Disposition: A | Discharge status: Home / Self Care | Payer: Medicare Other | Source: Ambulatory Visit | Attending: Surgery | Admitting: Surgery

## 2014-05-21 ENCOUNTER — Encounter (HOSPITAL_COMMUNITY)
Admission: RE | Disposition: A | Discharge status: Home / Self Care | Payer: Medicare Other | Source: Ambulatory Visit | Attending: Surgery

## 2014-05-21 ENCOUNTER — Inpatient Hospital Stay (HOSPITAL_COMMUNITY): Payer: Medicare Other | Admitting: Anesthesiology

## 2014-05-21 ENCOUNTER — Encounter (HOSPITAL_COMMUNITY): Payer: Self-pay | Admitting: *Deleted

## 2014-05-21 ENCOUNTER — Inpatient Hospital Stay (HOSPITAL_COMMUNITY): Payer: Medicare Other

## 2014-05-21 DIAGNOSIS — Z7982 Long term (current) use of aspirin: Secondary | ICD-10-CM | POA: Diagnosis not present

## 2014-05-21 DIAGNOSIS — E785 Hyperlipidemia, unspecified: Secondary | ICD-10-CM | POA: Diagnosis present

## 2014-05-21 DIAGNOSIS — I2510 Atherosclerotic heart disease of native coronary artery without angina pectoris: Secondary | ICD-10-CM | POA: Diagnosis not present

## 2014-05-21 DIAGNOSIS — I2582 Chronic total occlusion of coronary artery: Secondary | ICD-10-CM | POA: Diagnosis present

## 2014-05-21 DIAGNOSIS — Z8249 Family history of ischemic heart disease and other diseases of the circulatory system: Secondary | ICD-10-CM

## 2014-05-21 DIAGNOSIS — H9192 Unspecified hearing loss, left ear: Secondary | ICD-10-CM | POA: Diagnosis present

## 2014-05-21 DIAGNOSIS — I2109 ST elevation (STEMI) myocardial infarction involving other coronary artery of anterior wall: Secondary | ICD-10-CM | POA: Diagnosis not present

## 2014-05-21 DIAGNOSIS — R110 Nausea: Secondary | ICD-10-CM | POA: Diagnosis not present

## 2014-05-21 DIAGNOSIS — I252 Old myocardial infarction: Secondary | ICD-10-CM

## 2014-05-21 DIAGNOSIS — Z951 Presence of aortocoronary bypass graft: Secondary | ICD-10-CM | POA: Diagnosis not present

## 2014-05-21 DIAGNOSIS — R072 Precordial pain: Secondary | ICD-10-CM | POA: Diagnosis present

## 2014-05-21 DIAGNOSIS — D6959 Other secondary thrombocytopenia: Secondary | ICD-10-CM | POA: Diagnosis not present

## 2014-05-21 DIAGNOSIS — I25110 Atherosclerotic heart disease of native coronary artery with unstable angina pectoris: Secondary | ICD-10-CM | POA: Diagnosis not present

## 2014-05-21 DIAGNOSIS — D62 Acute posthemorrhagic anemia: Secondary | ICD-10-CM | POA: Diagnosis not present

## 2014-05-21 DIAGNOSIS — I10 Essential (primary) hypertension: Secondary | ICD-10-CM | POA: Diagnosis present

## 2014-05-21 DIAGNOSIS — J9811 Atelectasis: Secondary | ICD-10-CM | POA: Diagnosis not present

## 2014-05-21 DIAGNOSIS — Z79899 Other long term (current) drug therapy: Secondary | ICD-10-CM

## 2014-05-21 DIAGNOSIS — E119 Type 2 diabetes mellitus without complications: Secondary | ICD-10-CM | POA: Diagnosis present

## 2014-05-21 DIAGNOSIS — Z4682 Encounter for fitting and adjustment of non-vascular catheter: Secondary | ICD-10-CM | POA: Diagnosis not present

## 2014-05-21 DIAGNOSIS — I5023 Acute on chronic systolic (congestive) heart failure: Secondary | ICD-10-CM | POA: Diagnosis not present

## 2014-05-21 DIAGNOSIS — J811 Chronic pulmonary edema: Secondary | ICD-10-CM | POA: Diagnosis not present

## 2014-05-21 DIAGNOSIS — J90 Pleural effusion, not elsewhere classified: Secondary | ICD-10-CM | POA: Diagnosis not present

## 2014-05-21 HISTORY — PX: CORONARY ARTERY BYPASS GRAFT: SHX141

## 2014-05-21 HISTORY — PX: INTRAOPERATIVE TRANSESOPHAGEAL ECHOCARDIOGRAM: SHX5062

## 2014-05-21 LAB — CBC
HCT: 30.7 % — ABNORMAL LOW (ref 36.0–46.0)
HCT: 29.8 % — ABNORMAL LOW (ref 36.0–46.0)
HEMOGLOBIN: 9.7 g/dL — AB (ref 12.0–15.0)
Hemoglobin: 10.3 g/dL — ABNORMAL LOW (ref 12.0–15.0)
MCH: 31.6 pg (ref 26.0–34.0)
MCH: 31.2 pg (ref 26.0–34.0)
MCHC: 33.6 g/dL (ref 30.0–36.0)
MCHC: 32.6 g/dL (ref 30.0–36.0)
MCV: 94.2 fL (ref 78.0–100.0)
MCV: 95.8 fL (ref 78.0–100.0)
PLATELETS: 99 10*3/uL — AB (ref 150–400)
PLATELETS: 94 10*3/uL — AB (ref 150–400)
RBC: 3.26 MIL/uL — ABNORMAL LOW (ref 3.87–5.11)
RBC: 3.11 MIL/uL — AB (ref 3.87–5.11)
RDW: 14.5 % (ref 11.5–15.5)
RDW: 14.6 % (ref 11.5–15.5)
WBC: 5 10*3/uL (ref 4.0–10.5)
WBC: 6.5 10*3/uL (ref 4.0–10.5)

## 2014-05-21 LAB — PROTIME-INR
INR: 1.46 (ref 0.00–1.49)
PROTHROMBIN TIME: 17.8 s — AB (ref 11.6–15.2)

## 2014-05-21 LAB — GLUCOSE, CAPILLARY
GLUCOSE-CAPILLARY: 64 mg/dL — AB (ref 70–99)
GLUCOSE-CAPILLARY: 126 mg/dL — AB (ref 70–99)
GLUCOSE-CAPILLARY: 160 mg/dL — AB (ref 70–99)
Glucose-Capillary: 104 mg/dL — ABNORMAL HIGH (ref 70–99)
Glucose-Capillary: 128 mg/dL — ABNORMAL HIGH (ref 70–99)

## 2014-05-21 LAB — POCT I-STAT, CHEM 8
BUN: 8 mg/dL (ref 6–23)
BUN: 8 mg/dL (ref 6–23)
BUN: 6 mg/dL (ref 6–23)
BUN: 7 mg/dL (ref 6–23)
BUN: 6 mg/dL (ref 6–23)
BUN: 5 mg/dL — AB (ref 6–23)
CALCIUM ION: 1.14 mmol/L (ref 1.13–1.30)
CHLORIDE: 104 meq/L (ref 96–112)
CREATININE: 0.5 mg/dL (ref 0.50–1.10)
CREATININE: 0.5 mg/dL (ref 0.50–1.10)
CREATININE: 0.6 mg/dL (ref 0.50–1.10)
Calcium, Ion: 1.34 mmol/L — ABNORMAL HIGH (ref 1.13–1.30)
Calcium, Ion: 1.33 mmol/L — ABNORMAL HIGH (ref 1.13–1.30)
Calcium, Ion: 1.03 mmol/L — ABNORMAL LOW (ref 1.13–1.30)
Calcium, Ion: 1.06 mmol/L — ABNORMAL LOW (ref 1.13–1.30)
Calcium, Ion: 1.26 mmol/L (ref 1.13–1.30)
Chloride: 107 mEq/L (ref 96–112)
Chloride: 108 mEq/L (ref 96–112)
Chloride: 103 mEq/L (ref 96–112)
Chloride: 111 mEq/L (ref 96–112)
Chloride: 109 mEq/L (ref 96–112)
Creatinine, Ser: 0.6 mg/dL (ref 0.50–1.10)
Creatinine, Ser: 0.5 mg/dL (ref 0.50–1.10)
Creatinine, Ser: 0.4 mg/dL — ABNORMAL LOW (ref 0.50–1.10)
GLUCOSE: 78 mg/dL (ref 70–99)
GLUCOSE: 106 mg/dL — AB (ref 70–99)
Glucose, Bld: 116 mg/dL — ABNORMAL HIGH (ref 70–99)
Glucose, Bld: 94 mg/dL (ref 70–99)
Glucose, Bld: 159 mg/dL — ABNORMAL HIGH (ref 70–99)
Glucose, Bld: 134 mg/dL — ABNORMAL HIGH (ref 70–99)
HCT: 37 % (ref 36.0–46.0)
HCT: 26 % — ABNORMAL LOW (ref 36.0–46.0)
HCT: 24 % — ABNORMAL LOW (ref 36.0–46.0)
HCT: 28 % — ABNORMAL LOW (ref 36.0–46.0)
HEMATOCRIT: 30 % — AB (ref 36.0–46.0)
HEMATOCRIT: 26 % — AB (ref 36.0–46.0)
HEMOGLOBIN: 12.6 g/dL (ref 12.0–15.0)
Hemoglobin: 10.2 g/dL — ABNORMAL LOW (ref 12.0–15.0)
Hemoglobin: 8.8 g/dL — ABNORMAL LOW (ref 12.0–15.0)
Hemoglobin: 8.8 g/dL — ABNORMAL LOW (ref 12.0–15.0)
Hemoglobin: 8.2 g/dL — ABNORMAL LOW (ref 12.0–15.0)
Hemoglobin: 9.5 g/dL — ABNORMAL LOW (ref 12.0–15.0)
POTASSIUM: 3.9 meq/L (ref 3.7–5.3)
POTASSIUM: 4.1 meq/L (ref 3.7–5.3)
POTASSIUM: 4.6 meq/L (ref 3.7–5.3)
Potassium: 4 mEq/L (ref 3.7–5.3)
Potassium: 4.8 mEq/L (ref 3.7–5.3)
Potassium: 3.8 mEq/L (ref 3.7–5.3)
SODIUM: 141 meq/L (ref 137–147)
SODIUM: 140 meq/L (ref 137–147)
SODIUM: 135 meq/L — AB (ref 137–147)
Sodium: 138 mEq/L (ref 137–147)
Sodium: 137 mEq/L (ref 137–147)
Sodium: 142 mEq/L (ref 137–147)
TCO2: 24 mmol/L (ref 0–100)
TCO2: 26 mmol/L (ref 0–100)
TCO2: 22 mmol/L (ref 0–100)
TCO2: 22 mmol/L (ref 0–100)
TCO2: 27 mmol/L (ref 0–100)
TCO2: 20 mmol/L (ref 0–100)

## 2014-05-21 LAB — APTT: aPTT: 31 seconds (ref 24–37)

## 2014-05-21 LAB — CREATININE, SERUM
Creatinine, Ser: 0.63 mg/dL (ref 0.50–1.10)
GFR calc non Af Amer: 89 mL/min — ABNORMAL LOW (ref >90)

## 2014-05-21 LAB — POCT I-STAT 3, ART BLOOD GAS (G3+)
Acid-Base Excess: 1 mmol/L (ref 0.0–2.0)
Acid-base deficit: 1 mmol/L (ref 0.0–2.0)
Acid-base deficit: 4 mmol/L — ABNORMAL HIGH (ref 0.0–2.0)
Acid-base deficit: 4 mmol/L — ABNORMAL HIGH (ref 0.0–2.0)
BICARBONATE: 22.5 meq/L (ref 20.0–24.0)
Bicarbonate: 25.5 mEq/L — ABNORMAL HIGH (ref 20.0–24.0)
Bicarbonate: 21.5 mEq/L (ref 20.0–24.0)
Bicarbonate: 22.1 mEq/L (ref 20.0–24.0)
O2 SAT: 95 %
O2 SAT: 96 %
O2 Saturation: 100 %
O2 Saturation: 96 %
PCO2 ART: 40.9 mmHg (ref 35.0–45.0)
PCO2 ART: 39.4 mmHg (ref 35.0–45.0)
PH ART: 7.342 — AB (ref 7.350–7.450)
PO2 ART: 464 mmHg — AB (ref 80.0–100.0)
PO2 ART: 89 mmHg (ref 80.0–100.0)
Patient temperature: 37
TCO2: 27 mmol/L (ref 0–100)
TCO2: 23 mmol/L (ref 0–100)
TCO2: 23 mmol/L (ref 0–100)
TCO2: 23 mmol/L (ref 0–100)
pCO2 arterial: 31.5 mmHg — ABNORMAL LOW (ref 35.0–45.0)
pCO2 arterial: 44.6 mmHg (ref 35.0–45.0)
pH, Arterial: 7.402 (ref 7.350–7.450)
pH, Arterial: 7.458 — ABNORMAL HIGH (ref 7.350–7.450)
pH, Arterial: 7.303 — ABNORMAL LOW (ref 7.350–7.450)
pO2, Arterial: 66 mmHg — ABNORMAL LOW (ref 80.0–100.0)
pO2, Arterial: 87 mmHg (ref 80.0–100.0)

## 2014-05-21 LAB — POCT I-STAT 4, (NA,K, GLUC, HGB,HCT)
Glucose, Bld: 87 mg/dL (ref 70–99)
HEMATOCRIT: 31 % — AB (ref 36.0–46.0)
Hemoglobin: 10.5 g/dL — ABNORMAL LOW (ref 12.0–15.0)
Potassium: 3.3 mEq/L — ABNORMAL LOW (ref 3.7–5.3)
Sodium: 143 mEq/L (ref 137–147)

## 2014-05-21 LAB — PLATELET COUNT: PLATELETS: 109 10*3/uL — AB (ref 150–400)

## 2014-05-21 LAB — HEMOGLOBIN AND HEMATOCRIT, BLOOD
HCT: 26.8 % — ABNORMAL LOW (ref 36.0–46.0)
HEMOGLOBIN: 8.9 g/dL — AB (ref 12.0–15.0)

## 2014-05-21 SURGERY — CORONARY ARTERY BYPASS GRAFTING (CABG)
Anesthesia: General | Site: Chest

## 2014-05-21 MED ORDER — SODIUM CHLORIDE 0.9 % IV SOLN: INTRAVENOUS | Status: DC | PRN

## 2014-05-21 MED ORDER — MORPHINE SULFATE 2 MG/ML IJ SOLN
2.0000 mg | INTRAMUSCULAR | Status: DC | PRN
  Administered 2014-05-22 (×4): 2 mg via INTRAVENOUS
  Filled 2014-05-21 (×5): qty 1

## 2014-05-21 MED ORDER — SODIUM CHLORIDE 0.9 % IJ SOLN
INTRAMUSCULAR | Status: AC
  Filled 2014-05-21: qty 10

## 2014-05-21 MED ORDER — SODIUM CHLORIDE 0.9 % IJ SOLN
3.0000 mL | Freq: Two times a day (BID) | INTRAMUSCULAR | Status: DC
  Administered 2014-05-22 – 2014-05-23 (×4): 3 mL via INTRAVENOUS

## 2014-05-21 MED ORDER — FENTANYL CITRATE 0.05 MG/ML IJ SOLN
INTRAMUSCULAR | Status: AC
  Filled 2014-05-21: qty 5

## 2014-05-21 MED ORDER — ACETAMINOPHEN 500 MG PO TABS
1000.0000 mg | ORAL_TABLET | Freq: Four times a day (QID) | ORAL | Status: DC
  Administered 2014-05-22 – 2014-05-23 (×7): 1000 mg via ORAL
  Filled 2014-05-21 (×10): qty 2

## 2014-05-21 MED ORDER — ASPIRIN 81 MG PO CHEW
324.0000 mg | CHEWABLE_TABLET | Freq: Every day | ORAL | Status: DC
Start: 2014-05-22 — End: 2014-05-23

## 2014-05-21 MED ORDER — LIDOCAINE HCL (CARDIAC) 20 MG/ML IV SOLN
INTRAVENOUS | Status: AC
  Filled 2014-05-21: qty 5

## 2014-05-21 MED ORDER — SODIUM CHLORIDE 0.9 % IJ SOLN: 3.0000 mL | INTRAMUSCULAR | Status: DC | PRN

## 2014-05-21 MED ORDER — METOPROLOL TARTRATE 1 MG/ML IV SOLN: 2.5000 mg | INTRAVENOUS | Status: DC | PRN

## 2014-05-21 MED ORDER — THROMBIN 20000 UNITS EX SOLR
CUTANEOUS | Status: AC
  Filled 2014-05-21: qty 20000

## 2014-05-21 MED ORDER — PHENYLEPHRINE HCL 10 MG/ML IJ SOLN
0.0000 ug/min | INTRAMUSCULAR | Status: DC
  Filled 2014-05-21 (×2): qty 2

## 2014-05-21 MED ORDER — MORPHINE SULFATE 2 MG/ML IJ SOLN
1.0000 mg | INTRAMUSCULAR | Status: AC | PRN
  Administered 2014-05-21 (×2): 1 mg via INTRAVENOUS

## 2014-05-21 MED ORDER — ONDANSETRON HCL 4 MG/2ML IJ SOLN
4.0000 mg | Freq: Four times a day (QID) | INTRAMUSCULAR | Status: DC | PRN
  Administered 2014-05-22: 4 mg via INTRAVENOUS
  Filled 2014-05-21: qty 2

## 2014-05-21 MED ORDER — FAMOTIDINE IN NACL 20-0.9 MG/50ML-% IV SOLN
20.0000 mg | Freq: Two times a day (BID) | INTRAVENOUS | Status: AC
  Administered 2014-05-21: 20 mg via INTRAVENOUS

## 2014-05-21 MED ORDER — SODIUM CHLORIDE 0.9 % IJ SOLN
INTRAMUSCULAR | Status: AC
  Filled 2014-05-21: qty 30

## 2014-05-21 MED ORDER — TRAMADOL HCL 50 MG PO TABS: 50.0000 mg | ORAL_TABLET | ORAL | Status: DC | PRN

## 2014-05-21 MED ORDER — SODIUM CHLORIDE 0.45 % IV SOLN
INTRAVENOUS | Status: DC
Start: 2014-05-21 — End: 2014-05-23
  Administered 2014-05-21: 15:00:00 via INTRAVENOUS

## 2014-05-21 MED ORDER — SODIUM CHLORIDE 0.9 % IV SOLN
10.0000 g | INTRAVENOUS | Status: DC | PRN
  Administered 2014-05-21: 5 g/h via INTRAVENOUS

## 2014-05-21 MED ORDER — MAGNESIUM SULFATE 4 GM/100ML IV SOLN
4.0000 g | Freq: Once | INTRAVENOUS | Status: AC
  Administered 2014-05-21: 4 g via INTRAVENOUS
  Filled 2014-05-21: qty 100

## 2014-05-21 MED ORDER — HEMOSTATIC AGENTS (NO CHARGE) OPTIME
TOPICAL | Status: DC | PRN
  Administered 2014-05-21 (×2): 1 via TOPICAL

## 2014-05-21 MED ORDER — FENTANYL CITRATE 0.05 MG/ML IJ SOLN
INTRAMUSCULAR | Status: DC | PRN
  Administered 2014-05-21: 50 ug via INTRAVENOUS
  Administered 2014-05-21: 150 ug via INTRAVENOUS
  Administered 2014-05-21: 100 ug via INTRAVENOUS
  Administered 2014-05-21: 50 ug via INTRAVENOUS
  Administered 2014-05-21 (×2): 100 ug via INTRAVENOUS
  Administered 2014-05-21: 50 ug via INTRAVENOUS
  Administered 2014-05-21 (×2): 250 ug via INTRAVENOUS
  Administered 2014-05-21: 100 ug via INTRAVENOUS
  Administered 2014-05-21: 50 ug via INTRAVENOUS
  Administered 2014-05-21: 250 ug via INTRAVENOUS

## 2014-05-21 MED ORDER — ETOMIDATE 2 MG/ML IV SOLN
INTRAVENOUS | Status: DC | PRN
  Administered 2014-05-21: 15 mg via INTRAVENOUS

## 2014-05-21 MED ORDER — ACETAMINOPHEN 160 MG/5ML PO SOLN: 1000.0000 mg | Freq: Four times a day (QID) | ORAL | Status: DC

## 2014-05-21 MED ORDER — POTASSIUM CHLORIDE 10 MEQ/50ML IV SOLN
10.0000 meq | INTRAVENOUS | Status: DC
  Administered 2014-05-21: 10 meq via INTRAVENOUS

## 2014-05-21 MED ORDER — PROTAMINE SULFATE 10 MG/ML IV SOLN
INTRAVENOUS | Status: AC
  Filled 2014-05-21: qty 25

## 2014-05-21 MED ORDER — NITROGLYCERIN IN D5W 200-5 MCG/ML-% IV SOLN: 0.0000 ug/min | INTRAVENOUS | Status: DC

## 2014-05-21 MED ORDER — PHENYLEPHRINE 40 MCG/ML (10ML) SYRINGE FOR IV PUSH (FOR BLOOD PRESSURE SUPPORT)
PREFILLED_SYRINGE | INTRAVENOUS | Status: AC
  Filled 2014-05-21: qty 10

## 2014-05-21 MED ORDER — ARTIFICIAL TEARS OP OINT
TOPICAL_OINTMENT | OPHTHALMIC | Status: AC
  Filled 2014-05-21: qty 3.5

## 2014-05-21 MED ORDER — SODIUM CHLORIDE 0.9 % IV SOLN
INTRAVENOUS | Status: DC
  Administered 2014-05-21: 16:00:00 via INTRAVENOUS

## 2014-05-21 MED ORDER — METOPROLOL TARTRATE 25 MG/10 ML ORAL SUSPENSION
12.5000 mg | Freq: Two times a day (BID) | ORAL | Status: DC
  Filled 2014-05-21 (×3): qty 5

## 2014-05-21 MED ORDER — ROCURONIUM BROMIDE 100 MG/10ML IV SOLN
INTRAVENOUS | Status: DC | PRN
  Administered 2014-05-21 (×2): 50 mg via INTRAVENOUS

## 2014-05-21 MED ORDER — LACTATED RINGERS IV SOLN: 500.0000 mL | Freq: Once | INTRAVENOUS | Status: AC | PRN

## 2014-05-21 MED ORDER — SODIUM CHLORIDE 0.9 % IV SOLN: 250.0000 mL | INTRAVENOUS | Status: DC

## 2014-05-21 MED ORDER — CHLORHEXIDINE GLUCONATE 4 % EX LIQD
30.0000 mL | CUTANEOUS | Status: DC
  Filled 2014-05-21: qty 30

## 2014-05-21 MED ORDER — DEXMEDETOMIDINE HCL IN NACL 200 MCG/50ML IV SOLN
0.0000 ug/kg/h | INTRAVENOUS | Status: DC
  Administered 2014-05-21: 0.2 ug/kg/h via INTRAVENOUS
  Filled 2014-05-21: qty 50

## 2014-05-21 MED ORDER — ALBUMIN HUMAN 5 % IV SOLN
INTRAVENOUS | Status: DC | PRN
  Administered 2014-05-21: 14:00:00 via INTRAVENOUS

## 2014-05-21 MED ORDER — POTASSIUM CHLORIDE 10 MEQ/50ML IV SOLN
10.0000 meq | INTRAVENOUS | Status: AC
Start: 2014-05-21 — End: 2014-05-21
  Administered 2014-05-21 (×3): 10 meq via INTRAVENOUS

## 2014-05-21 MED ORDER — ARTIFICIAL TEARS OP OINT
TOPICAL_OINTMENT | OPHTHALMIC | Status: DC | PRN
  Administered 2014-05-21: 1 via OPHTHALMIC

## 2014-05-21 MED ORDER — STERILE WATER FOR INJECTION IJ SOLN
INTRAMUSCULAR | Status: AC
  Filled 2014-05-21: qty 10

## 2014-05-21 MED ORDER — CETYLPYRIDINIUM CHLORIDE 0.05 % MT LIQD
7.0000 mL | Freq: Four times a day (QID) | OROMUCOSAL | Status: DC
  Administered 2014-05-21 – 2014-05-23 (×8): 7 mL via OROMUCOSAL

## 2014-05-21 MED ORDER — SODIUM CHLORIDE 0.9 % IJ SOLN: 10.0000 mL | INTRAMUSCULAR | Status: DC | PRN

## 2014-05-21 MED ORDER — MIDAZOLAM HCL 10 MG/2ML IJ SOLN
INTRAMUSCULAR | Status: AC
  Filled 2014-05-21: qty 2

## 2014-05-21 MED ORDER — VECURONIUM BROMIDE 10 MG IV SOLR
INTRAVENOUS | Status: AC
  Filled 2014-05-21: qty 10

## 2014-05-21 MED ORDER — PROTAMINE SULFATE 10 MG/ML IV SOLN
INTRAVENOUS | Status: DC | PRN
  Administered 2014-05-21: 200 mg via INTRAVENOUS

## 2014-05-21 MED ORDER — EPHEDRINE SULFATE 50 MG/ML IJ SOLN
INTRAMUSCULAR | Status: AC
  Filled 2014-05-21: qty 1

## 2014-05-21 MED ORDER — DEXTROSE 5 % IV SOLN
1.5000 g | Freq: Two times a day (BID) | INTRAVENOUS | Status: AC
  Administered 2014-05-21 – 2014-05-23 (×4): 1.5 g via INTRAVENOUS
  Filled 2014-05-21 (×4): qty 1.5

## 2014-05-21 MED ORDER — MIDAZOLAM HCL 5 MG/5ML IJ SOLN
INTRAMUSCULAR | Status: DC | PRN
  Administered 2014-05-21: 2 mg via INTRAVENOUS
  Administered 2014-05-21: 1 mg via INTRAVENOUS
  Administered 2014-05-21: 2 mg via INTRAVENOUS
  Administered 2014-05-21: 1 mg via INTRAVENOUS
  Administered 2014-05-21 (×2): 2 mg via INTRAVENOUS

## 2014-05-21 MED ORDER — SUCCINYLCHOLINE CHLORIDE 20 MG/ML IJ SOLN
INTRAMUSCULAR | Status: AC
  Filled 2014-05-21: qty 1

## 2014-05-21 MED ORDER — SODIUM CHLORIDE 0.9 % IJ SOLN
10.0000 mL | Freq: Two times a day (BID) | INTRAMUSCULAR | Status: DC
  Administered 2014-05-22 (×3): 10 mL

## 2014-05-21 MED ORDER — INSULIN REGULAR BOLUS VIA INFUSION
0.0000 [IU] | Freq: Three times a day (TID) | INTRAVENOUS | Status: DC
  Filled 2014-05-21: qty 10

## 2014-05-21 MED ORDER — THROMBIN 20000 UNITS EX SOLR
CUTANEOUS | Status: DC | PRN
  Administered 2014-05-21: 20000 [IU] via TOPICAL

## 2014-05-21 MED ORDER — ROCURONIUM BROMIDE 50 MG/5ML IV SOLN
INTRAVENOUS | Status: AC
  Filled 2014-05-21: qty 2

## 2014-05-21 MED ORDER — BISACODYL 10 MG RE SUPP: 10.0000 mg | Freq: Every day | RECTAL | Status: DC

## 2014-05-21 MED ORDER — HEPARIN SODIUM (PORCINE) 1000 UNIT/ML IJ SOLN
INTRAMUSCULAR | Status: AC
  Filled 2014-05-21: qty 1

## 2014-05-21 MED ORDER — DEXTROSE 50 % IV SOLN
14.0000 mL | Freq: Once | INTRAVENOUS | Status: AC
  Filled 2014-05-21: qty 50

## 2014-05-21 MED ORDER — ASPIRIN EC 325 MG PO TBEC
325.0000 mg | DELAYED_RELEASE_TABLET | Freq: Every day | ORAL | Status: DC
  Administered 2014-05-22 – 2014-05-23 (×2): 325 mg via ORAL
  Filled 2014-05-21 (×2): qty 1

## 2014-05-21 MED ORDER — PROPOFOL 10 MG/ML IV BOLUS
INTRAVENOUS | Status: AC
  Filled 2014-05-21: qty 20

## 2014-05-21 MED ORDER — VECURONIUM BROMIDE 10 MG IV SOLR
INTRAVENOUS | Status: DC | PRN
  Administered 2014-05-21: 10 mg via INTRAVENOUS

## 2014-05-21 MED ORDER — ALBUMIN HUMAN 5 % IV SOLN
250.0000 mL | INTRAVENOUS | Status: AC | PRN
  Administered 2014-05-21 (×3): 250 mL via INTRAVENOUS
  Filled 2014-05-21: qty 250

## 2014-05-21 MED ORDER — SODIUM CHLORIDE 0.9 % IV SOLN
INTRAVENOUS | Status: DC
  Administered 2014-05-21: 0.7 [IU]/h via INTRAVENOUS
  Filled 2014-05-21 (×2): qty 2.5

## 2014-05-21 MED ORDER — BISACODYL 5 MG PO TBEC
10.0000 mg | DELAYED_RELEASE_TABLET | Freq: Every day | ORAL | Status: DC
  Administered 2014-05-22 – 2014-05-23 (×2): 10 mg via ORAL
  Filled 2014-05-21 (×2): qty 2

## 2014-05-21 MED ORDER — ACETAMINOPHEN 650 MG RE SUPP
650.0000 mg | Freq: Once | RECTAL | Status: AC
  Administered 2014-05-21: 650 mg via RECTAL

## 2014-05-21 MED ORDER — OXYCODONE HCL 5 MG PO TABS
5.0000 mg | ORAL_TABLET | ORAL | Status: DC | PRN
  Administered 2014-05-22 – 2014-05-23 (×2): 5 mg via ORAL
  Filled 2014-05-21 (×2): qty 1

## 2014-05-21 MED ORDER — VANCOMYCIN HCL IN DEXTROSE 1-5 GM/200ML-% IV SOLN
1000.0000 mg | Freq: Once | INTRAVENOUS | Status: AC
  Administered 2014-05-21: 1000 mg via INTRAVENOUS
  Filled 2014-05-21: qty 200

## 2014-05-21 MED ORDER — DEXTROSE 50 % IV SOLN
INTRAVENOUS | Status: AC
  Administered 2014-05-21: 14 mL
  Filled 2014-05-21: qty 50

## 2014-05-21 MED ORDER — LACTATED RINGERS IV SOLN
INTRAVENOUS | Status: DC | PRN
  Administered 2014-05-21 (×2): via INTRAVENOUS

## 2014-05-21 MED ORDER — METOPROLOL TARTRATE 12.5 MG HALF TABLET
12.5000 mg | ORAL_TABLET | Freq: Two times a day (BID) | ORAL | Status: DC
  Filled 2014-05-21 (×3): qty 1

## 2014-05-21 MED ORDER — CHLORHEXIDINE GLUCONATE 0.12 % MT SOLN
15.0000 mL | Freq: Two times a day (BID) | OROMUCOSAL | Status: DC
  Administered 2014-05-21: 15 mL via OROMUCOSAL
  Filled 2014-05-21: qty 15

## 2014-05-21 MED ORDER — DOCUSATE SODIUM 100 MG PO CAPS
200.0000 mg | ORAL_CAPSULE | Freq: Every day | ORAL | Status: DC
  Administered 2014-05-22 – 2014-05-23 (×2): 200 mg via ORAL
  Filled 2014-05-21 (×2): qty 2

## 2014-05-21 MED ORDER — 0.9 % SODIUM CHLORIDE (POUR BTL) OPTIME
TOPICAL | Status: DC | PRN
  Administered 2014-05-21: 6000 mL

## 2014-05-21 MED ORDER — HEPARIN SODIUM (PORCINE) 1000 UNIT/ML IJ SOLN
INTRAMUSCULAR | Status: DC | PRN
  Administered 2014-05-21: 20000 [IU] via INTRAVENOUS

## 2014-05-21 MED ORDER — MUPIROCIN 2 % EX OINT
1.0000 "application " | TOPICAL_OINTMENT | Freq: Two times a day (BID) | CUTANEOUS | Status: DC
  Administered 2014-05-21: 1 via NASAL
  Filled 2014-05-21 (×2): qty 22

## 2014-05-21 MED ORDER — LACTATED RINGERS IV SOLN
INTRAVENOUS | Status: DC
  Administered 2014-05-21: 15:00:00 via INTRAVENOUS
  Administered 2014-05-22: 20 mL/h via INTRAVENOUS

## 2014-05-21 MED ORDER — MIDAZOLAM HCL 2 MG/2ML IJ SOLN: 2.0000 mg | INTRAMUSCULAR | Status: DC | PRN

## 2014-05-21 MED ORDER — SODIUM CHLORIDE 0.9 % IV SOLN
INTRAVENOUS | Status: DC | PRN
  Administered 2014-05-21: 13:00:00 via INTRAVENOUS

## 2014-05-21 MED ORDER — ACETAMINOPHEN 160 MG/5ML PO SOLN: 650.0000 mg | Freq: Once | ORAL | Status: AC

## 2014-05-21 MED ORDER — CHLORHEXIDINE GLUCONATE CLOTH 2 % EX PADS
6.0000 | MEDICATED_PAD | Freq: Every day | CUTANEOUS | Status: DC
  Administered 2014-05-21: 6 via TOPICAL

## 2014-05-21 SURGICAL SUPPLY — 108 items
ATTRACTOMAT 16X20 MAGNETIC DRP (DRAPES) ×2 IMPLANT
BAG DECANTER FOR FLEXI CONT (MISCELLANEOUS) ×4 IMPLANT
BANDAGE ELASTIC 4 VELCRO ST LF (GAUZE/BANDAGES/DRESSINGS) ×4 IMPLANT
BANDAGE ELASTIC 6 VELCRO ST LF (GAUZE/BANDAGES/DRESSINGS) ×4 IMPLANT
BASKET HEART  (ORDER IN 25'S) (MISCELLANEOUS) ×1
BASKET HEART (ORDER IN 25'S) (MISCELLANEOUS) ×1
BASKET HEART (ORDER IN 25S) (MISCELLANEOUS) ×2 IMPLANT
BLADE STERNUM SYSTEM 6 (BLADE) ×4 IMPLANT
BNDG GAUZE ELAST 4 BULKY (GAUZE/BANDAGES/DRESSINGS) ×4 IMPLANT
CABLE PACING FASLOC BIEGE (MISCELLANEOUS) ×2 IMPLANT
CANISTER SUCTION 2500CC (MISCELLANEOUS) ×4 IMPLANT
CARDIAC SUCTION (MISCELLANEOUS) ×4 IMPLANT
CATH ROBINSON RED A/P 18FR (CATHETERS) ×4 IMPLANT
CATH THORACIC 28FR (CATHETERS) ×4 IMPLANT
CATH THORACIC 36FR (CATHETERS) ×4 IMPLANT
CATH THORACIC 36FR RT ANG (CATHETERS) ×4 IMPLANT
CLIP TI MEDIUM 24 (CLIP) IMPLANT
CLIP TI WIDE RED SMALL 24 (CLIP) ×10 IMPLANT
COUNTER NEEDLE 20 DBL MAG RED (NEEDLE) ×2 IMPLANT
COVER SURGICAL LIGHT HANDLE (MISCELLANEOUS) ×4 IMPLANT
CRADLE DONUT ADULT HEAD (MISCELLANEOUS) ×4 IMPLANT
DRAPE CARDIOVASCULAR INCISE (DRAPES) ×4
DRAPE SLUSH/WARMER DISC (DRAPES) ×4 IMPLANT
DRAPE SRG 135X102X78XABS (DRAPES) ×2 IMPLANT
DRSG COVADERM 4X14 (GAUZE/BANDAGES/DRESSINGS) ×4 IMPLANT
ELECT CAUTERY BLADE 6.4 (BLADE) ×4 IMPLANT
ELECT REM PT RETURN 9FT ADLT (ELECTROSURGICAL) ×8
ELECTRODE REM PT RTRN 9FT ADLT (ELECTROSURGICAL) ×4 IMPLANT
GAUZE SPONGE 4X4 12PLY STRL (GAUZE/BANDAGES/DRESSINGS) ×8 IMPLANT
GLOVE BIO SURGEON STRL SZ 6 (GLOVE) IMPLANT
GLOVE BIO SURGEON STRL SZ 6.5 (GLOVE) IMPLANT
GLOVE BIO SURGEON STRL SZ7 (GLOVE) IMPLANT
GLOVE BIO SURGEON STRL SZ7.5 (GLOVE) IMPLANT
GLOVE BIO SURGEONS STRL SZ 6.5 (GLOVE)
GLOVE BIOGEL PI IND STRL 6 (GLOVE) IMPLANT
GLOVE BIOGEL PI IND STRL 6.5 (GLOVE) IMPLANT
GLOVE BIOGEL PI IND STRL 7.0 (GLOVE) IMPLANT
GLOVE BIOGEL PI INDICATOR 6 (GLOVE) ×4
GLOVE BIOGEL PI INDICATOR 6.5 (GLOVE) ×8
GLOVE BIOGEL PI INDICATOR 7.0 (GLOVE) ×4
GLOVE EUDERMIC 7 POWDERFREE (GLOVE) ×8 IMPLANT
GLOVE ORTHO TXT STRL SZ7.5 (GLOVE) IMPLANT
GLOVE SURG SS PI 6.0 STRL IVOR (GLOVE) ×2 IMPLANT
GLOVE SURG SS PI 6.5 STRL IVOR (GLOVE) ×4 IMPLANT
GLOVE SURG SS PI 7.5 STRL IVOR (GLOVE) ×4 IMPLANT
GOWN STRL REUS W/ TWL LRG LVL3 (GOWN DISPOSABLE) ×8 IMPLANT
GOWN STRL REUS W/ TWL XL LVL3 (GOWN DISPOSABLE) ×2 IMPLANT
GOWN STRL REUS W/TWL LRG LVL3 (GOWN DISPOSABLE) ×28
GOWN STRL REUS W/TWL XL LVL3 (GOWN DISPOSABLE) ×4
HEMOSTAT POWDER SURGIFOAM 1G (HEMOSTASIS) ×12 IMPLANT
HEMOSTAT SURGICEL 2X14 (HEMOSTASIS) ×4 IMPLANT
INSERT FOGARTY 61MM (MISCELLANEOUS) IMPLANT
INSERT FOGARTY XLG (MISCELLANEOUS) IMPLANT
KIT BASIN OR (CUSTOM PROCEDURE TRAY) ×4 IMPLANT
KIT CATH CPB BARTLE (MISCELLANEOUS) ×4 IMPLANT
KIT ROOM TURNOVER OR (KITS) ×4 IMPLANT
KIT SUCTION CATH 14FR (SUCTIONS) ×4 IMPLANT
KIT VASOVIEW W/TROCAR VH 2000 (KITS) ×4 IMPLANT
NS IRRIG 1000ML POUR BTL (IV SOLUTION) ×22 IMPLANT
PACK OPEN HEART (CUSTOM PROCEDURE TRAY) ×4 IMPLANT
PAD ARMBOARD 7.5X6 YLW CONV (MISCELLANEOUS) ×8 IMPLANT
PAD ELECT DEFIB RADIOL ZOLL (MISCELLANEOUS) ×4 IMPLANT
PENCIL BUTTON HOLSTER BLD 10FT (ELECTRODE) ×4 IMPLANT
PUNCH AORTIC ROTATE 4.0MM (MISCELLANEOUS) IMPLANT
PUNCH AORTIC ROTATE 4.5MM 8IN (MISCELLANEOUS) ×4 IMPLANT
PUNCH AORTIC ROTATE 5MM 8IN (MISCELLANEOUS) IMPLANT
SET CARDIOPLEGIA MPS 5001102 (MISCELLANEOUS) ×2 IMPLANT
SPONGE GAUZE 4X4 12PLY STER LF (GAUZE/BANDAGES/DRESSINGS) ×4 IMPLANT
SPONGE INTESTINAL PEANUT (DISPOSABLE) IMPLANT
SPONGE LAP 18X18 X RAY DECT (DISPOSABLE) ×2 IMPLANT
SPONGE LAP 4X18 X RAY DECT (DISPOSABLE) ×2 IMPLANT
SUT BONE WAX W31G (SUTURE) ×4 IMPLANT
SUT MNCRL AB 4-0 PS2 18 (SUTURE) IMPLANT
SUT PROLENE 3 0 SH DA (SUTURE) IMPLANT
SUT PROLENE 3 0 SH1 36 (SUTURE) ×4 IMPLANT
SUT PROLENE 4 0 RB 1 (SUTURE) ×8
SUT PROLENE 4 0 SH DA (SUTURE) IMPLANT
SUT PROLENE 4-0 RB1 .5 CRCL 36 (SUTURE) IMPLANT
SUT PROLENE 5 0 C 1 36 (SUTURE) ×4 IMPLANT
SUT PROLENE 5 0 RB 2 (SUTURE) ×4 IMPLANT
SUT PROLENE 6 0 C 1 30 (SUTURE) ×8 IMPLANT
SUT PROLENE 7 0 BV 1 (SUTURE) IMPLANT
SUT PROLENE 7 0 BV1 MDA (SUTURE) ×8 IMPLANT
SUT PROLENE 8 0 BV175 6 (SUTURE) ×4 IMPLANT
SUT SILK  1 MH (SUTURE)
SUT SILK 1 MH (SUTURE) IMPLANT
SUT SILK 2 0 SH CR/8 (SUTURE) ×2 IMPLANT
SUT STEEL STERNAL CCS#1 18IN (SUTURE) IMPLANT
SUT STEEL SZ 6 DBL 3X14 BALL (SUTURE) ×6 IMPLANT
SUT VIC AB 1 CTX 36 (SUTURE)
SUT VIC AB 1 CTX36XBRD ANBCTR (SUTURE) ×4 IMPLANT
SUT VIC AB 2-0 CT1 27 (SUTURE) ×4
SUT VIC AB 2-0 CT1 TAPERPNT 27 (SUTURE) IMPLANT
SUT VIC AB 2-0 CTX 27 (SUTURE) IMPLANT
SUT VIC AB 3-0 SH 27 (SUTURE)
SUT VIC AB 3-0 SH 27X BRD (SUTURE) IMPLANT
SUT VIC AB 3-0 X1 27 (SUTURE) ×2 IMPLANT
SUT VICRYL 4-0 PS2 18IN ABS (SUTURE) IMPLANT
SUTURE E-PAK OPEN HEART (SUTURE) ×4 IMPLANT
SYSTEM SAHARA CHEST DRAIN ATS (WOUND CARE) ×4 IMPLANT
TAPE CLOTH SURG 4X10 WHT LF (GAUZE/BANDAGES/DRESSINGS) ×2 IMPLANT
TAPE PAPER 2X10 WHT MICROPORE (GAUZE/BANDAGES/DRESSINGS) ×2 IMPLANT
TOWEL OR 17X24 6PK STRL BLUE (TOWEL DISPOSABLE) ×4 IMPLANT
TOWEL OR 17X26 10 PK STRL BLUE (TOWEL DISPOSABLE) ×4 IMPLANT
TRAY FOLEY IC TEMP SENS 16FR (CATHETERS) ×4 IMPLANT
TUBING INSUFFLATION (TUBING) ×4 IMPLANT
UNDERPAD 30X30 INCONTINENT (UNDERPADS AND DIAPERS) ×4 IMPLANT
WATER STERILE IRR 1000ML POUR (IV SOLUTION) ×8 IMPLANT

## 2014-05-21 NOTE — Anesthesia Preprocedure Evaluation (Addendum)
Anesthesia Evaluation  Patient identified by MRN, date of birth, ID band Patient awake    Reviewed: Allergy & Precautions, H&P , NPO status , Patient's Chart, lab work & pertinent test results, reviewed documented beta blocker date and time   Airway Mallampati: II  TM Distance: >3 FB Neck ROM: full    Dental  (+) Teeth Intact, Dental Advisory Given   Pulmonary shortness of breath,          Cardiovascular hypertension, Pt. on medications and Pt. on home beta blockers + CAD, + Past MI and +CHF Rhythm:Regular Rate:Normal  03/2014 Echo: EF 25-30%, mid-apical/anteroseptal and inf AK, Gr 2 DD, ? apical thrombus, Mild MR.-->Discharged with LifeVest.   Neuro/Psych  Headaches,    GI/Hepatic LFTs greatly elevated 3 weeks ago.  Lipitor was held and now LFTs close to normal range.   Endo/Other    Renal/GU      Musculoskeletal   Abdominal   Peds  Hematology   Anesthesia Other Findings   Reproductive/Obstetrics                            Anesthesia Physical Anesthesia Plan  ASA: III  Anesthesia Plan: General   Post-op Pain Management:    Induction: Intravenous  Airway Management Planned: Oral ETT  Additional Equipment: Arterial line, CVP, PA Cath, TEE and Ultrasound Guidance Line Placement  Intra-op Plan:   Post-operative Plan: Post-operative intubation/ventilation  Informed Consent: I have reviewed the patients History and Physical, chart, labs and discussed the procedure including the risks, benefits and alternatives for the proposed anesthesia with the patient or authorized representative who has indicated his/her understanding and acceptance.     Plan Discussed with: CRNA, Anesthesiologist and Surgeon  Anesthesia Plan Comments:         Anesthesia Quick Evaluation

## 2014-05-21 NOTE — Progress Notes (Addendum)
Hypoglycemic Event  CBG:64  Treatment: Dextrose 50% 14 ml per glucostabilizer  Symptoms: None  Follow-up CBG: Time:1550 CBG Result:104  Possible Reasons for Event: insulin drip post op cabg  Comments/MD notified:treated and will notify on rounds Dr. Darcey Nora notified on rounds at 1815.   Lucy Chris  Remember to initiate Hypoglycemia Order Set & complete

## 2014-05-21 NOTE — Plan of Care (Signed)
Problem: Phase I - Pre-Op Goal: Point person for discharge identified Outcome: Completed/Met Date Met:  05/21/14 Husband Bill     

## 2014-05-21 NOTE — Op Note (Signed)
CARDIOVASCULAR SURGERY OPERATIVE NOTE  05/21/2014  Surgeon:  Gaye Pollack, MD  First Assistant: Jadene Pierini,  PA-C   Preoperative Diagnosis:  Severe left main and multi-vessel coronary artery disease, s/p acute anteroseptal STEMI and PCI LAD.   Postoperative Diagnosis:  Same   Procedure:  1. Median Sternotomy 2. Extracorporeal circulation 3.   Coronary artery bypass grafting x 4   Left internal mammary graft to the LAD  SVG to diagonal  SVG to OM  SVG to RCA 4.   Endoscopic vein harvest from the right leg   Anesthesia:  General Endotracheal   Clinical History/Surgical Indication:  The patient is a 68 year old woman with hypertension and hyperlipidemia who presented on 03/07/2014 with a 2 day history of intermittent epigastric and lower substernal chest pain. In the ER her troponin was negative and her ECG showed no new changes. She was given a GI cocktail and her symptoms resolved so she was discharged. The following morning she developed recurrent pain in her chest associated with nausea and shortness of breath. She returned to the ER and her ECG showed anterior and inferior ST elevation with a troponin of 0.07. She was taken to the cath lab as a code STEMI and the culprit was a occluded proximal LAD. She also had 50-70% distal LM, 70-80% ostial LAD, 50-70% ostial LCX and diffuse 50% proximal RCA disease. The LAD was opened with PCI with BMS with a good result. The LVEF post- PCI on ventriculogram was 25-30% with moderate MR. There was akinesis of the mid anterior and distal inferior walls with dyskinesis of the distal anterior wall and apex. Her troponin later that day was > 20. She developed some pulmonary edema that night that responded to diuretics. Her ECG on 03/10/2014, 3 days after he PCI showed diffuse anterolateral ST changes. She was started on Coreg and Cozaar and maintained on  Brilinta. She had a life vest placed at discharge. She says that she has been feeling fairly good but still having a little substernal chest discomfort at times. She was scheduled for surgery on 04/20/2014 but her preop LFT's were markedly elevated due to the statin she was placed on during her initial hospitalization for anterior MI. Her surgery was cancelled and the statin was stopped. Her follow-up LFT's have progressively improved and are almost back to normal. She has a significant distal left main bifurcation stenosis that extends into the ostium of the LAD and LCX and is still at risk of further ischemia and infarction despite opening up her occluded LAD. Unfortunately she had a large infarct resulting in poor LV function despite having a low troponin at presentation and quick reperfusion of the LAD. I think she must have embolized some of the thrombus present in the proximal LAD. I think CABG is indicated to prevent further ischemia and infarction from the residual stenoses. I discussed the operative procedure with the patient and family including alternatives, benefits and risks; including but not limited to bleeding, blood transfusion, infection, stroke, myocardial infarction, graft failure, heart block requiring a permanent pacemaker, organ dysfunction, and death. Varney Baas Ohanesian understands and agrees to proceed.    Preparation:  The patient was seen in the preoperative holding area and the correct patient, correct operation were confirmed with the patient after reviewing the medical record and catheterization. The consent was signed by me. Preoperative antibiotics were given. A pulmonary arterial line and radial arterial line were placed by the anesthesia team. The patient was taken back  to the operating room and positioned supine on the operating room table. After being placed under general endotracheal anesthesia by the anesthesia team a foley catheter was placed. The neck, chest, abdomen, and  both legs were prepped with betadine soap and solution and draped in the usual sterile manner. A surgical time-out was taken and the correct patient and operative procedure were confirmed with the nursing and anesthesia staff.  TEE: performed by Dr. Albertha Ghee  Pre-bypass TEE showed an improved LVEF of approximately 50% with septal akinesis. There was mild MR.  Post-bypass TEE unchanged.  Cardiopulmonary Bypass:  A median sternotomy was performed. The pericardium was opened in the midline. Right ventricular function appeared normal. The ascending aorta was of normal size and had no palpable plaque. There were no contraindications to aortic cannulation or cross-clamping. The patient was fully systemically heparinized and the ACT was maintained > 400 sec. The proximal aortic arch was cannulated with a 20 F aortic cannula for arterial inflow. Venous cannulation was performed via the right atrial appendage using a two-staged venous cannula. An antegrade cardioplegia/vent cannula was inserted into the mid-ascending aorta. Aortic occlusion was performed with a single cross-clamp. Systemic cooling to 32 degrees Centigrade and topical cooling of the heart with iced saline were used. Hyperkalemic antegrade cold blood cardioplegia was used to induce diastolic arrest and was then given at about 20 minute intervals throughout the period of arrest to maintain myocardial temperature at or below 10 degrees centigrade. A temperature probe was inserted into the interventricular septum and an insulating pad was placed in the pericardium.   Left internal mammary harvest:  The left side of the sternum was retracted using the Rultract retractor. The left internal mammary artery was harvested as a pedicle graft. All side branches were clipped. It was a medium-sized vessel of good quality with excellent blood flow. It was ligated distally and divided. It was sprayed with topical papaverine solution to prevent  vasospasm.   Endoscopic vein harvest:  The right greater saphenous vein was harvested endoscopically through a 2 cm incision medial to the right knee. It was harvested from the upper thigh to below the knee. It was a medium-sized vein of good quality. The side branches were all ligated with 4-0 silk ties.    Coronary arteries:  The coronary arteries were examined.   LAD:  Large vessel with mild distal disease.There was mild patchy scar on the anterior wall. The first diagonal was a medium-sized graftable vessel and since this came off between the LM and the LAD stent I decided to graft it.  LCX:  The OM was a moderate-sized vessel with no distal disease.  RCA:  The RCA beyond the crux was a large vessel with no disease.   Grafts:  1. LIMA to the LAD: 1.75 mm. It was sewn end to side using 8-0 prolene continuous suture. 2. SVG to diagonal:  1.6 mm. It was sewn end to side using 7-0 prolene continuous suture. 3. SVG to OM:  1.75 mm. It was sewn end to side using 7-0 prolene continuous suture. 4. SVG to RCA :  2.5 mm. It was sewn end to side using 7-0 prolene continuous suture.  The proximal vein graft anastomoses were performed to the mid-ascending aorta using continuous 6-0 prolene suture. Graft markers were placed around the proximal anastomoses.   Completion:  The patient was rewarmed to 37 degrees Centigrade. The clamp was removed from the LIMA pedicle and there was rapid warming of the septum  and return of ventricular fibrillation. The crossclamp was removed with a time of 94 minutes. There was spontaneous return of sinus rhythm. The distal and proximal anastomoses were checked for hemostasis. The position of the grafts was satisfactory. Two temporary epicardial pacing wires were placed on the right atrium and two on the right ventricle. The patient was weaned from CPB without difficulty on no inotropes. CPB time was 113 minutes. Cardiac output was 4.5 LPM. Heparin was fully  reversed with protamine and the aortic and venous cannulas removed. Hemostasis was achieved. Mediastinal and left pleural drainage tubes were placed. The sternum was closed with double #6 stainless steel wires. The fascia was closed with continuous # 1 vicryl suture. The subcutaneous tissue was closed with 2-0 vicryl continuous suture. The skin was closed with 3-0 vicryl subcuticular suture. All sponge, needle, and instrument counts were reported correct at the end of the case. Dry sterile dressings were placed over the incisions and around the chest tubes which were connected to pleurevac suction. The patient was then transported to the surgical intensive care unit in critical but stable condition.

## 2014-05-21 NOTE — H&P (Signed)
[address]       [name]             [phone]      Cardiothoracic Surgery History and Physical   PCP is Tommy Medal, MD Referring Provider is Martinique, Peter M, MD  Chief Complaint  Patient presents with  . NEW CARDIAC    CATHED 9/3    HPI:  The patient is a 68 year old woman with hypertension and hyperlipidemia who presented on 03/07/2014 with a 2 day history of intermittent epigastric and lower substernal chest pain. In the ER her troponin was negative and her ECG showed no new changes. She was given a GI cocktail and her symptoms resolved so she was discharged. The following morning she developed recurrent pain in her chest associated with nausea and shortness of breath. She returned to the ER and her ECG showed anterior and inferior ST elevation with a troponin of 0.07. She was taken to the cath lab as a code STEMI and the culprit was a occluded proximal LAD. She also had 50-70% distal LM, 70-80% ostial LAD, 50-70% ostial LCX and diffuse 50% proximal RCA disease. The LAD was opened with PCI with BMS with a good result. The LVEF post- PCI on ventriculogram was 25-30% with moderate MR. There was akinesis of the mid anterior and distal inferior walls with dyskinesis of the distal anterior wall and apex. Her troponin later that day was > 20. She developed some pulmonary edema that night that responded to diuretics. Her ECG on 03/10/2014, 3 days after he PCI showed diffuse anterolateral ST changes. She was started on Coreg and Cozaar and maintained on Brilinta. She had a life vest placed at discharge. She says that she has been feeling fairly good but still having a little substernal chest discomfort at times. She was scheduled for surgery on 04/20/2014 but her preop LFT's were markedly elevated due to the statin she was placed on during her initial hospitalization for anterior MI. Her surgery was cancelled and the statin was stopped. Her follow-up  LFT's have progressively improved and are almost back to normal.   Past Medical History  Diagnosis Date  . Hypertension   . Hyperlipidemia   . Malaise and fatigue   . Insomnia   . Family history of colon cancer   . Family history of cardiovascular disease   . History of long-term treatment with high-risk medication   . Sinus headache   . Alopecia   . CAD (coronary artery disease)     a. 03/2014 Ant STEMI/PCI: LM 50-70d, LAD 70-80ost, 100p (2.75x20 Veriflex BMS), LCX 50-70ost, 40-50p, RCA dom - 50p/m, EF 25-30%, mid ant/dist inf AK, dist ant/apical DK, mod MR.  . Ischemic cardiomyopathy     a. 03/2014 Echo: EF 25-30%, mid-apical/anteroseptal and inf AK, Gr 2 DD, ? apical thrombus, Mild MR.-->Discharged with LifeVest.  . Chronic systolic CHF (congestive heart failure)     a. 03/2014 Echo: EF 25-30%.    Past Surgical History  Procedure Laterality Date  . Cardiac catheterization  2001    showed no blockages  . Cystectomy  1998    fibroid cysts  . 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 Cervical spine surgery  09/27/2003    C6-67 servical fusion  . Dilation and curettage of uterus  1976    2nd to miscarriage  . Cataract extraction w/phaco Left 01/04/2014  Procedure: CATARACT EXTRACTION PHACO AND INTRAOCULAR LENS PLACEMENT (IOC); Surgeon: Tonny Branch, MD; Location: [facility]; Service: Ophthalmology; Laterality: Left; CDE:5.65  . Cataract extraction w/phaco Right 01/29/2014    Procedure: CATARACT EXTRACTION PHACO AND INTRAOCULAR LENS PLACEMENT RIGHT EYE CDE=4.49; Surgeon: Tonny Branch, MD; Location: [facility]; Service: Ophthalmology; Laterality: Right;    Family History  Problem Relation Age of Onset  . Stroke Mother   . Heart attack Mother   . Peripheral vascular disease Father     had stent in leg  . Colon cancer  Father   . Atrial fibrillation Father     with pacemaker  . Thyroid disease Father   . Pancreatic cancer Brother   . Coronary artery disease Brother     with Stent  . Coronary artery disease Brother     with CABG in his 29's  . Thyroid disease Brother   . Thyroid disease Sister     Social History History  Substance Use Topics  . Smoking status: Never Smoker   . Smokeless tobacco: Never Used  . Alcohol Use: Yes     Comment: rare glass of wine    Current Outpatient Prescriptions  Medication Sig Dispense Refill  . aspirin EC 81 MG tablet Take 81 mg by mouth daily.    Marland Kitchen atorvastatin (LIPITOR) 80 MG tablet Take 1 tablet (80 mg total) by mouth daily at 6 PM. 30 tablet 6  . carvedilol (COREG) 3.125 MG tablet Take 1 tablet (3.125 mg total) by mouth 2 (two) times daily with a meal. 60 tablet 6  . isosorbide mononitrate (IMDUR) 30 MG 24 hr tablet Take 0.5 tablets (15 mg total) by mouth daily. 90 tablet 3  . losartan (COZAAR) 25 MG tablet Take 0.5 tablets (12.5 mg total) by mouth daily. 30 tablet 3  . ticagrelor (BRILINTA) 90 MG TABS tablet Take 1 tablet (90 mg total) by mouth 2 (two) times daily. 60 tablet 2  . nitroGLYCERIN (NITROSTAT) 0.4 MG SL tablet Place 1 tablet (0.4 mg total) under the tongue every 5 (five) minutes as needed for chest pain. 25 tablet 3   No current facility-administered medications for this visit.    Allergies  Allergen Reactions  . Demerol [Meperidine]     Nausea   . Dexilant [Dexlansoprazole]     Rash   . Lisinopril     cough  . Meperidine Hcl   . Sulfonamide Derivatives     Rash   . Latex Rash    Review of Systems  Constitutional: Positive for fatigue. Negative for fever, activity change and appetite change.   Has been watching her diet closely and has lost 6 lbs.  HENT: Negative.  Eyes:  Negative.  Respiratory: Negative for cough and shortness of breath.  Cardiovascular: Positive for chest pain and leg swelling. Negative for palpitations.   No orthopnea or PND  Gastrointestinal: Negative.  Endocrine: Negative.  Genitourinary: Negative.  Musculoskeletal: Negative.  Skin: Negative.  Neurological: Negative.  Hematological: Negative.  Psychiatric/Behavioral: Negative.    BP 116/72  Pulse 70  Ht 5' 5.5" (1.664 m)  Wt 157 lb (71.215 kg)  BMI 25.72 kg/m2  SpO2 98% Physical Exam  Constitutional: She is oriented to person, place, and time. She appears well-developed and well-nourished. No distress.  HENT:  Head: Normocephalic and atraumatic.  Mouth/Throat: Oropharynx is clear and moist.  Eyes: EOM are normal. Pupils are equal, round, and reactive to light.  Neck: Neck supple. No JVD present. No thyromegaly present.  Cardiovascular: Normal  rate, regular rhythm, normal heart sounds and intact distal pulses. Exam reveals no friction rub.  No murmur heard. Pulmonary/Chest: Effort normal and breath sounds normal. No respiratory distress. She has no rales.  Abdominal: Soft. Bowel sounds are normal. She exhibits no distension and no mass. There is no tenderness.  Musculoskeletal: She exhibits no edema.  Neurological: She is alert and oriented to person, place, and time. She has normal strength. No cranial nerve deficit or sensory deficit.  Skin: Skin is warm and dry.  Psychiatric: She has a normal mood and affect.     Diagnostic Tests:  *[facility]* [place], [place] [address] [phone]  ------------------------------------------------------------------- Transthoracic Echocardiography  Patient: Kelli Roy, Kelli Roy MR #: [id number] Study Date: 03/12/2014 Gender: F Age: 52 Height: 165.1 cm Weight: 72.6 kg BSA: 1.84 m^2 Pt. Status: Room: 2W07C  ADMITTING Peter Martinique, M.D. ATTENDING Peter Martinique,  M.D. ORDERING Kirk Ruths SONOGRAPHER Melissa Morford, RDCS PERFORMING [facility], Inpatient  cc:  -------------------------------------------------------------------  ------------------------------------------------------------------- Indications: MI - acute 410.91.  ------------------------------------------------------------------- History: PMH: Chest pain. Coronary artery disease. Congestive heart failure. PMH: Myocardial infarction.  ------------------------------------------------------------------- Study Conclusions  - Left ventricle: Limited study with contrast. LVEF is severely depressed 20 to 25% with akinesis of septum, distal lateral, anterior, inferior and apical walls. No thrombus.  Transthoracic echocardiography. M-mode, limited 2D, limited spectral Doppler, and color Doppler. Birthdate: Patient birthdate: [birth year]. Age: Patient is 68 yr old. Sex: Gender: female. BMI: 26.6 kg/m^2. Blood pressure: 101/53 Patient status: Inpatient. Study date: Study date: 03/12/2014. Study time: 11:05 AM. Location: Bedside.  -------------------------------------------------------------------  ------------------------------------------------------------------- Left ventricle: Limited study with contrast. LVEF is severely depressed 20 to 25% with akinesis of septum, distal lateral, anterior, inferior and apical walls. No thrombus.  ------------------------------------------------------------------- Prepared and Electronically Authenticated by  Dorris Carnes, M.D. 2015-09-07T12:16:33   Cardiac Catheterization Procedure Note  Name: Kelli Roy  MRN: [id number]  DOB: September 14, 1945  Procedure: Left Heart Cath, Selective Coronary Angiography, LV angiography, PTCA and stenting of the proximal LAD  Indication: 68 yo WF with history of HTN, HL, and family history of CAD presents with an anterior STEMI.  Procedural Details: The right wrist was prepped, draped, and anesthetized with  1% lidocaine. Using the modified Seldinger technique, a 6 French slender sheath was introduced into the right radial artery. 3 mg of verapamil was administered through the sheath, weight-based unfractionated heparin was administered intravenously. Standard Judkins catheters were used for selective coronary angiography and left ventriculography. Catheter exchanges were performed over an exchange length guidewire.  PROCEDURAL FINDINGS  Hemodynamics:  AO 90/53 mean 69 mm Hg  LV 89/27 mm Hg  Coronary angiography:  Coronary dominance: right  Left mainstem: There is 50-70% stenosis in the distal left main.  Left anterior descending (LAD): 70-80% ostial LAD disease. The proximal LAD is 100% occluded.  Left circumflex (LCx): There is 50-70% ostial LCx disease. The proximal vessel has 40-50% stenosis.  Right coronary artery (RCA): The RCA is a dominant vessel. There is diffuse 50% disease in the proximal to mid vessel.  Left ventriculography: Left ventricular systolic function is assessed post PCI. LVEF is estimated at 25-30%. There is akinesis of the Mid anterior wall and distal inferior wall. There is dyskinesis of the distal anterior wall and apex. There is moderate MR.  PCI Note: Following the diagnostic procedure, the decision was made to proceed with PCI. Weight-based bivalirudin was given for anticoagulation. Brilinta 180 mg was given orally. Once a therapeutic ACT was achieved,  a 6 [place] XBLAD 3.5 guide catheter was inserted. A prowater coronary guidewire was used to cross the lesion. The lesion was predilated with a 2.5 mm balloon. The lesion was then stented with a 2.75 x 20 mm Veriflex stent. The stent was postdilated with a 3.0 mm noncompliant balloon. Following PCI, there was 0% residual stenosis and TIMI-3 flow. Final angiography confirmed an excellent result. The patient tolerated the procedure well and was pain free at the end of procedure. There were no immediate procedural  complications. A TR band was used for radial hemostasis. The patient was transferred to the post catheterization recovery area for further monitoring.  PCI Data:  Vessel - LAD/Segment - proximal  Percent Stenosis (pre) 100%  TIMI-flow 0  Stent 2.75 x 20 mm Veriflex  Percent Stenosis (post) 0%  TIMI-flow (post) 3  Final Conclusions:  1. 3 vessel and left main CAD. Culprit is occlusion of the proximal LAD.  2. Severe LV dysfunction.  3. Elevated LVEDP  4. Successful stenting of the proximal LAD with a BMS.  Recommendations:  Continue DAPT. Will transfer to ICU for further management. Echo ordered. Hold beta blocker and ACEi for now due to low BP. I think ultimately the patient will need to be considered for CABG for management of Left main and ostial LAD/LCx disease. For this reason a BMS was used.  Peter Martinique, [place]  03/08/2014, 11:08 AM    Impression:  She has a significant distal left main bifurcation stenosis that extends into the ostium of the LAD and LCX and is still at risk of further ischemia and infarction despite opening up her occluded LAD. Unfortunately she had a large infarct resulting in poor LV function despite having a low troponin at presentation and quick reperfusion of the LAD. I think she must have embolized some of the thrombus present in the proximal LAD. I think CABG is indicated to prevent further ischemia and infarction from the residual stenoses. I discussed the operative procedure with the patient and family including alternatives, benefits and risks; including but not limited to bleeding, blood transfusion, infection, stroke, myocardial infarction, graft failure, heart block requiring a permanent pacemaker, organ dysfunction, and death.  Kelli Roy understands and agrees to proceed.    Plan:  CABG

## 2014-05-21 NOTE — Interval H&P Note (Signed)
History and Physical Interval Note:  05/21/2014 7:56 AM  Kelli Roy  has presented today for surgery, with the diagnosis of CAD  The various methods of treatment have been discussed with the patient and family. After consideration of risks, benefits and other options for treatment, the patient has consented to  Procedure(s): CORONARY ARTERY BYPASS GRAFTING (CABG) (N/A) INTRAOPERATIVE TRANSESOPHAGEAL ECHOCARDIOGRAM (N/A) as a surgical intervention .  The patient's history has been reviewed, patient examined, no change in status, stable for surgery.  I have reviewed the patient's chart and labs.  Questions were answered to the patient's satisfaction.     Gaye Pollack

## 2014-05-21 NOTE — Progress Notes (Signed)
  Echocardiogram Echocardiogram Transesophageal has been performed.  Tomio Kirk FRANCES 05/21/2014, 8:50 AM

## 2014-05-21 NOTE — Plan of Care (Signed)
Problem: Phase I - Pre-Op Goal: Point person for discharge identified Outcome: Completed/Met Date Met:  05/21/14 Husband Bill

## 2014-05-21 NOTE — Anesthesia Procedure Notes (Signed)
Procedures

## 2014-05-21 NOTE — Procedures (Signed)
Extubation Procedure Note  Patient Details:   Name: Kelli Roy DOB: 1946-03-08 MRN: [id number]   Airway Documentation:     Evaluation  O2 sats: stable throughout Complications: No apparent complications Patient did tolerate procedure well. Bilateral Breath Sounds: Clear   Yes  Patient tolerated rapid wean. VC.7 L and NIF -20. MD ordered to extubate. Positive for cuff leak. Patient extubated to a 4 Lpm nasal cannula. No signs of dyspnea or stridor. Patient instructed on Incentive Spirometer achieving 250 mL, three times. RN at bedside. Patient resting comfortably.  Myrtie Neither 05/21/2014, 6:56 PM

## 2014-05-21 NOTE — Progress Notes (Signed)
CT surgery p.m. Rounds  Patient doing well after CABG earlier today Hemodynamics stable, off inotropes Patient ready for extubation imminently Minimal chest tube drainage Postoperative hematocrit 31%

## 2014-05-21 NOTE — Progress Notes (Signed)
   05/21/14 1415  Vitals  Temp 97.3 F (36.3 C)  Temp Source Core  BP (!) 89/59 mmHg  MAP (mmHg) 66  BP Location Right Arm  BP Method Automatic  Patient Position (if appropriate) Lying  Pulse Rate 80  Pulse Rate Source Monitor  ECG Heart Rate 80  Cardiac Rhythm Atrial paced;NSR  Resp (!) 22  Oxygen Therapy  SpO2 96 %  O2 Device Ventilator  Pulse Oximetry Type Continuous  SpO2 Alarm Limit Low (!) 90  Oximetry Probe Site Changed No  Pre-WUA / WUA Start  [place] Agitation Sedation Scale (RASS) -4  RASS Goal -4  Art Line  Arterial Line BP 104/52 mmHg  Arterial Line MAP (mmHg) 68 mmHg  Arterial Line Location Left radial  Art Line Wave Form Rezeroed;Appropriate wave forms  Invasive Hemodynamic Monitoring  PAP 28/9 mmHg  PAP (Mean) 13 mmHg  CO (L/min) 2.3 L/min  CI (L/min/m2) 1.3 L/min/m2  Pain Assessment  Pain Assessment CPOT  Critical Care Pain Observation Tool (CPOT)  Facial Expression 0  Body Movements 0  Muscle Tension 0  Compliance with ventilator (intubated pts.) 0  Vocalization (extubated pts.) N/A  CPOT Total 0  Glasgow Coma Scale  Eye Opening 1 (sedated and on vent from OR)  Modified Verbal Response (INTUBATED) 1  Best Motor Response 1  Glasgow Coma Scale Score 3  Height and Weight  Height 5' 5.5" (1.664 m)  Weight 67.1 kg (147 lb 14.9 oz)  BSA (Calculated - sq m) 1.76 sq meters  BMI (Calculated) 24.3  Weight in (lb) to have BMI = 25 152.2  Albumin 5% 250 ml hung.

## 2014-05-21 NOTE — Brief Op Note (Signed)
      [address]       [place] [address]             [phone]     05/21/2014  11:59 AM  PATIENT:  Kelli Roy  68 y.o. female  PRE-OPERATIVE DIAGNOSIS:  CAD  POST-OPERATIVE DIAGNOSIS:  CAD  PROCEDURE:  Procedure(s): CORONARY ARTERY BYPASS GRAFTING (CABG)X4 LIMA-LAD; SVG-DIAG; SVG-OM; SVG-RCA INTRAOPERATIVE TRANSESOPHAGEAL ECHOCARDIOGRAM [facility] RIGHT THIGH  SURGEON:  Surgeon(s): Gaye Pollack, MD  PHYSICIAN ASSISTANT: Torie Towle PA-C  ANESTHESIA:   general  PATIENT CONDITION:  ICU - intubated and hemodynamically stable.  PRE-OPERATIVE WEIGHT: 67kg  EBL: SEE ANEST/PERFUSION RECORDS  COMPLICATIONS: NO KNOWN

## 2014-05-21 NOTE — Anesthesia Postprocedure Evaluation (Signed)
  Anesthesia Post-op Note  Patient: Kelli Roy  Procedure(s) Performed: Procedure(s): CORONARY ARTERY BYPASS GRAFTING (CABG) (N/A) INTRAOPERATIVE TRANSESOPHAGEAL ECHOCARDIOGRAM (N/A)  Patient Location: ICU  Anesthesia Type:General  Level of Consciousness: Patient remains intubated per anesthesia plan  Airway and Oxygen Therapy: Patient remains intubated per anesthesia plan  Post-op Pain: none  Post-op Assessment: Post-op Vital signs reviewed  Post-op Vital Signs: Reviewed  Last Vitals:  Filed Vitals:   05/21/14 2030  BP:   Pulse: 90  Temp: 36.9 C  Resp: 23    Complications: No apparent anesthesia complications

## 2014-05-21 NOTE — Transfer of Care (Signed)
Immediate Anesthesia Transfer of Care Note  Patient: Kelli Roy  Procedure(s) Performed: Procedure(s): CORONARY ARTERY BYPASS GRAFTING (CABG) (N/A) INTRAOPERATIVE TRANSESOPHAGEAL ECHOCARDIOGRAM (N/A)  Patient Location: SICU  Anesthesia Type:General  Level of Consciousness: Patient remains intubated per anesthesia plan  Airway & Oxygen Therapy: Patient remains intubated per anesthesia plan and Patient placed on Ventilator (see vital sign flow sheet for setting)  Post-op Assessment: Report given to PACU RN and Post -op Vital signs reviewed and stable  Post vital signs: Reviewed and stable  Complications: No apparent anesthesia complications

## 2014-05-21 NOTE — Plan of Care (Signed)
Problem: Phase II - Intermediate Post-Op Goal: Wean to Extubate Outcome: Completed/Met Date Met:  05/21/14     

## 2014-05-22 ENCOUNTER — Inpatient Hospital Stay (HOSPITAL_COMMUNITY): Payer: Medicare Other

## 2014-05-22 ENCOUNTER — Encounter (HOSPITAL_COMMUNITY): Payer: Self-pay | Admitting: Surgery

## 2014-05-22 LAB — CBC
HCT: 29 % — ABNORMAL LOW (ref 36.0–46.0)
HEMATOCRIT: 27.8 % — AB (ref 36.0–46.0)
Hemoglobin: 9.5 g/dL — ABNORMAL LOW (ref 12.0–15.0)
Hemoglobin: 9 g/dL — ABNORMAL LOW (ref 12.0–15.0)
MCH: 31.6 pg (ref 26.0–34.0)
MCH: 31.3 pg (ref 26.0–34.0)
MCHC: 32.8 g/dL (ref 30.0–36.0)
MCHC: 32.4 g/dL (ref 30.0–36.0)
MCV: 96.3 fL (ref 78.0–100.0)
MCV: 96.5 fL (ref 78.0–100.0)
Platelets: 124 10*3/uL — ABNORMAL LOW (ref 150–400)
Platelets: 100 10*3/uL — ABNORMAL LOW (ref 150–400)
RBC: 3.01 MIL/uL — ABNORMAL LOW (ref 3.87–5.11)
RBC: 2.88 MIL/uL — AB (ref 3.87–5.11)
RDW: 14.8 % (ref 11.5–15.5)
RDW: 14.9 % (ref 11.5–15.5)
WBC: 9.9 10*3/uL (ref 4.0–10.5)
WBC: 7.7 10*3/uL (ref 4.0–10.5)

## 2014-05-22 LAB — CREATININE, SERUM
CREATININE: 0.62 mg/dL (ref 0.50–1.10)
GFR calc Af Amer: >90 mL/min (ref >90)
GFR, EST NON AFRICAN AMERICAN: 90 mL/min — AB (ref >90)

## 2014-05-22 LAB — BASIC METABOLIC PANEL
Anion gap: 12 (ref 5–15)
BUN: 8 mg/dL (ref 6–23)
CO2: 21 mEq/L (ref 19–32)
Calcium: 8.4 mg/dL (ref 8.4–10.5)
Chloride: 108 mEq/L (ref 96–112)
Creatinine, Ser: 0.64 mg/dL (ref 0.50–1.10)
GFR calc non Af Amer: 89 mL/min — ABNORMAL LOW (ref >90)
Glucose, Bld: 134 mg/dL — ABNORMAL HIGH (ref 70–99)
POTASSIUM: 4.6 meq/L (ref 3.7–5.3)
SODIUM: 141 meq/L (ref 137–147)

## 2014-05-22 LAB — GLUCOSE, CAPILLARY
GLUCOSE-CAPILLARY: 131 mg/dL — AB (ref 70–99)
GLUCOSE-CAPILLARY: 105 mg/dL — AB (ref 70–99)
GLUCOSE-CAPILLARY: 120 mg/dL — AB (ref 70–99)
Glucose-Capillary: 102 mg/dL — ABNORMAL HIGH (ref 70–99)
Glucose-Capillary: 115 mg/dL — ABNORMAL HIGH (ref 70–99)
Glucose-Capillary: 129 mg/dL — ABNORMAL HIGH (ref 70–99)
Glucose-Capillary: 136 mg/dL — ABNORMAL HIGH (ref 70–99)
Glucose-Capillary: 169 mg/dL — ABNORMAL HIGH (ref 70–99)
Glucose-Capillary: 125 mg/dL — ABNORMAL HIGH (ref 70–99)
Glucose-Capillary: 144 mg/dL — ABNORMAL HIGH (ref 70–99)

## 2014-05-22 LAB — POCT I-STAT, CHEM 8
BUN: 7 mg/dL (ref 6–23)
Calcium, Ion: 1.33 mmol/L — ABNORMAL HIGH (ref 1.13–1.30)
Chloride: 104 meq/L (ref 96–112)
Creatinine, Ser: 0.5 mg/dL (ref 0.50–1.10)
Glucose, Bld: 125 mg/dL — ABNORMAL HIGH (ref 70–99)
HCT: 27 % — ABNORMAL LOW (ref 36.0–46.0)
Hemoglobin: 9.2 g/dL — ABNORMAL LOW (ref 12.0–15.0)
Potassium: 4.3 meq/L (ref 3.7–5.3)
Sodium: 137 meq/L (ref 137–147)
TCO2: 22 mmol/L (ref 0–100)

## 2014-05-22 LAB — MAGNESIUM
MAGNESIUM: 2.5 mg/dL (ref 1.5–2.5)
MAGNESIUM: 2.4 mg/dL (ref 1.5–2.5)

## 2014-05-22 MED ORDER — MUPIROCIN 2 % EX OINT
1.0000 | TOPICAL_OINTMENT | Freq: Two times a day (BID) | CUTANEOUS | Status: DC
Start: 2014-05-22 — End: 2014-05-25
  Administered 2014-05-22 – 2014-05-25 (×7): 1 via NASAL

## 2014-05-22 MED ORDER — METOCLOPRAMIDE HCL 10 MG PO TABS
10.0000 mg | ORAL_TABLET | Freq: Three times a day (TID) | ORAL | Status: AC
Start: 2014-05-22 — End: 2014-05-24
  Administered 2014-05-22 – 2014-05-24 (×5): 10 mg via ORAL
  Filled 2014-05-22 (×6): qty 1

## 2014-05-22 MED ORDER — SODIUM CHLORIDE 0.9 % IV SOLN: INTRAVENOUS | Status: DC | PRN

## 2014-05-22 MED ORDER — INSULIN ASPART 100 UNIT/ML ~~LOC~~ SOLN: 0.0000 [IU] | SUBCUTANEOUS | Status: DC

## 2014-05-22 MED ORDER — INSULIN ASPART 100 UNIT/ML ~~LOC~~ SOLN
0.0000 [IU] | SUBCUTANEOUS | Status: DC
  Administered 2014-05-22 (×3): 2 [IU] via SUBCUTANEOUS

## 2014-05-22 MED ORDER — INSULIN ASPART 100 UNIT/ML ~~LOC~~ SOLN
0.0000 [IU] | SUBCUTANEOUS | Status: DC
  Administered 2014-05-22: 2 [IU] via SUBCUTANEOUS

## 2014-05-22 MED ORDER — CHLORHEXIDINE GLUCONATE CLOTH 2 % EX PADS
6.0000 | MEDICATED_PAD | Freq: Every day | CUTANEOUS | Status: DC
  Administered 2014-05-22 – 2014-05-24 (×3): 6 via TOPICAL

## 2014-05-22 MED ORDER — ENOXAPARIN SODIUM 40 MG/0.4ML ~~LOC~~ SOLN
40.0000 mg | Freq: Every day | SUBCUTANEOUS | Status: DC
  Administered 2014-05-22: 40 mg via SUBCUTANEOUS
  Filled 2014-05-22 (×2): qty 0.4

## 2014-05-22 MED FILL — Dextrose Inj 5%: INTRAVENOUS | Qty: 250 | Status: AC

## 2014-05-22 MED FILL — Sodium Chloride IV Soln 0.9%: INTRAVENOUS | Qty: 2000 | Status: AC

## 2014-05-22 MED FILL — Lidocaine HCl IV Inj 20 MG/ML: INTRAVENOUS | Qty: 5 | Status: AC

## 2014-05-22 MED FILL — Phenylephrine HCl Inj 10 MG/ML: INTRAMUSCULAR | Qty: 2 | Status: AC

## 2014-05-22 MED FILL — Potassium Chloride Inj 2 mEq/ML: INTRAVENOUS | Qty: 40 | Status: AC

## 2014-05-22 MED FILL — Heparin Sodium (Porcine) Inj 1000 Unit/ML: INTRAMUSCULAR | Qty: 30 | Status: AC

## 2014-05-22 MED FILL — Magnesium Sulfate Inj 50%: INTRAMUSCULAR | Qty: 10 | Status: AC

## 2014-05-22 MED FILL — Electrolyte-R (PH 7.4) Solution: INTRAVENOUS | Qty: 1000 | Status: AC

## 2014-05-22 MED FILL — Sodium Bicarbonate IV Soln 8.4%: INTRAVENOUS | Qty: 50 | Status: AC

## 2014-05-22 MED FILL — Mannitol IV Soln 20%: INTRAVENOUS | Qty: 500 | Status: AC

## 2014-05-22 NOTE — Progress Notes (Signed)
No complaints  BP 102/52 mmHg  Pulse 90  Temp(Src) 97.6 F (36.4 C) (Oral)  Resp 19  Ht 5' 5.5" (1.664 m)  Wt 150 lb 1.6 oz (68.085 kg)  BMI 24.59 kg/m2  SpO2 98%   Intake/Output Summary (Last 24 hours) at 05/22/14 1902 Last data filed at 05/22/14 1800  Gross per 24 hour  Intake 2139.96 ml  Output   1385 ml  Net 754.96 ml    Creatinine 0.62 Hct 28 PLT 100K- down slightly

## 2014-05-22 NOTE — Addendum Note (Signed)
Addendum  created 05/22/14 1236 by Babs Bertin, CRNA   Modules edited: Anesthesia Medication Administration

## 2014-05-22 NOTE — Progress Notes (Signed)
1 Day Post-Op Procedure(s) (LRB): CORONARY ARTERY BYPASS GRAFTING (CABG) (N/A) INTRAOPERATIVE TRANSESOPHAGEAL ECHOCARDIOGRAM (N/A) Subjective: No complaints  Objective: Vital signs in last 24 hours: Temp:  [95.2 F (35.1 C)-99.3 F (37.4 C)] 99.1 F (37.3 C) (11/17 0700) Pulse Rate:  [79-90] 90 (11/17 0700) Cardiac Rhythm:  [-] Atrial paced (11/17 0600) Resp:  [11-33] 17 (11/17 0700) BP: (78-121)/(41-70) 88/49 mmHg (11/17 0700) SpO2:  [95 %-100 %] 97 % (11/17 0700) Arterial Line BP: (95-152)/(39-75) 108/52 mmHg (11/17 0700) FiO2 (%):  [40 %-50 %] 40 % (11/16 1812) Weight:  [67.1 kg (147 lb 14.9 oz)-68.085 kg (150 lb 1.6 oz)] 68.085 kg (150 lb 1.6 oz) (11/17 0400)  Hemodynamic parameters for last 24 hours: PAP: (21-39)/(6-22) 27/11 mmHg CO:  [2.3 L/min-3.9 L/min] 3.5 L/min CI:  [1.3 L/min/m2-2.2 L/min/m2] 2 L/min/m2  Intake/Output from previous day: 11/16 0701 - 11/17 0700 In: 5349.3 [I.V.:3639.3; Blood:350; NG/GT:30; IV Piggyback:1330] Out: 6384 [Urine:4075; Blood:900; Chest Tube:460] Intake/Output this shift:    General appearance: alert and cooperative Neurologic: intact Heart: regular rate and rhythm, S1, S2 normal, no murmur, click, rub or gallop Lungs: clear to auscultation bilaterally Extremities: extremities normal, atraumatic, no cyanosis or edema Wound: dressing dry  Lab Results:  Recent Labs  05/21/14 2015 05/22/14 0353  WBC 6.5 9.9  HGB 9.7* 9.5*  HCT 29.8* 29.0*  PLT 94* 124*   BMET:  Recent Labs  05/21/14 2004 05/21/14 2015 05/22/14 0353  NA 142  --  141  Roy 4.6  --  4.6  CL 109  --  108  CO2  --   --  21  GLUCOSE 134*  --  134*  BUN 5*  --  8  CREATININE 0.60 0.63 0.64  CALCIUM  --   --  8.4    PT/INR:  Recent Labs  05/21/14 1417  LABPROT 17.8*  INR 1.46   ABG    Component Value Date/Time   PHART 7.303* 05/21/2014 2008   HCO3 22.1 05/21/2014 2008   TCO2 23 05/21/2014 2008   ACIDBASEDEF 4.0* 05/21/2014 2008   O2SAT 96.0  05/21/2014 2008   CBG (last 3)   Recent Labs  05/22/14 0021 05/22/14 0121 05/22/14 0408  GLUCAP 102* 115* 129*   CXR: mild bibasilar atelectasis  ECG:  NSR, no acute changes  Assessment/Plan: S/P Procedure(s) (LRB): CORONARY ARTERY BYPASS GRAFTING (CABG) (N/A) INTRAOPERATIVE TRANSESOPHAGEAL ECHOCARDIOGRAM (N/A)  Hemodynamically stable. Still on a little neo. Wean as tolerated. Hold beta blocker today while weaning neo.  Prior echo on 03/30/2014 showed an EF of 25% but TEE in OR showed an EF around 50% pre-bypass and post-bypass. Will need to decide about need for continuing LifeVest before discharge. Mobilize Diabetes control: preop Hgb A1c was 6.1. Continue CBG's and SSI. d/c tubes/lines Continue foley due to patient in ICU and urinary output monitoring See progression orders   LOS: 1 day    Kelli Roy 05/22/2014

## 2014-05-22 NOTE — Progress Notes (Signed)
Utilization Review Completed.Donne Anon T11/17/2015

## 2014-05-23 ENCOUNTER — Inpatient Hospital Stay (HOSPITAL_COMMUNITY): Payer: Medicare Other

## 2014-05-23 LAB — GLUCOSE, CAPILLARY
GLUCOSE-CAPILLARY: 101 mg/dL — AB (ref 70–99)
GLUCOSE-CAPILLARY: 129 mg/dL — AB (ref 70–99)
Glucose-Capillary: 90 mg/dL (ref 70–99)
Glucose-Capillary: 108 mg/dL — ABNORMAL HIGH (ref 70–99)
Glucose-Capillary: 133 mg/dL — ABNORMAL HIGH (ref 70–99)

## 2014-05-23 LAB — CBC
HCT: 26.8 % — ABNORMAL LOW (ref 36.0–46.0)
HEMOGLOBIN: 8.5 g/dL — AB (ref 12.0–15.0)
MCH: 30.1 pg (ref 26.0–34.0)
MCHC: 31.7 g/dL (ref 30.0–36.0)
MCV: 95 fL (ref 78.0–100.0)
Platelets: 92 10*3/uL — ABNORMAL LOW (ref 150–400)
RBC: 2.82 MIL/uL — AB (ref 3.87–5.11)
RDW: 14.9 % (ref 11.5–15.5)
WBC: 6.8 10*3/uL (ref 4.0–10.5)

## 2014-05-23 LAB — BASIC METABOLIC PANEL
Anion gap: 6 (ref 5–15)
BUN: 9 mg/dL (ref 6–23)
CHLORIDE: 107 meq/L (ref 96–112)
CO2: 25 mEq/L (ref 19–32)
Calcium: 8.8 mg/dL (ref 8.4–10.5)
Creatinine, Ser: 0.63 mg/dL (ref 0.50–1.10)
GFR calc non Af Amer: 89 mL/min — ABNORMAL LOW (ref >90)
Glucose, Bld: 88 mg/dL (ref 70–99)
POTASSIUM: 3.9 meq/L (ref 3.7–5.3)
SODIUM: 138 meq/L (ref 137–147)

## 2014-05-23 MED ORDER — DOCUSATE SODIUM 100 MG PO CAPS
200.0000 mg | ORAL_CAPSULE | Freq: Every day | ORAL | Status: DC
  Administered 2014-05-24 – 2014-05-25 (×2): 200 mg via ORAL
  Filled 2014-05-23 (×2): qty 2

## 2014-05-23 MED ORDER — OXYCODONE HCL 5 MG PO TABS
5.0000 mg | ORAL_TABLET | ORAL | Status: DC | PRN
  Administered 2014-05-23 – 2014-05-24 (×3): 5 mg via ORAL
  Filled 2014-05-23 (×3): qty 1

## 2014-05-23 MED ORDER — SODIUM CHLORIDE 0.9 % IJ SOLN: 3.0000 mL | INTRAMUSCULAR | Status: DC | PRN

## 2014-05-23 MED ORDER — FERROUS GLUCONATE 324 (38 FE) MG PO TABS
324.0000 mg | ORAL_TABLET | Freq: Two times a day (BID) | ORAL | Status: DC
  Administered 2014-05-24 – 2014-05-25 (×4): 324 mg via ORAL
  Filled 2014-05-23 (×6): qty 1

## 2014-05-23 MED ORDER — ONDANSETRON HCL 4 MG/2ML IJ SOLN: 4.0000 mg | Freq: Four times a day (QID) | INTRAMUSCULAR | Status: DC | PRN

## 2014-05-23 MED ORDER — SODIUM CHLORIDE 0.9 % IV SOLN: 250.0000 mL | INTRAVENOUS | Status: DC | PRN

## 2014-05-23 MED ORDER — ACETAMINOPHEN 325 MG PO TABS
650.0000 mg | ORAL_TABLET | Freq: Four times a day (QID) | ORAL | Status: DC | PRN
Start: 2014-05-23 — End: 2014-05-25

## 2014-05-23 MED ORDER — POTASSIUM CHLORIDE CRYS ER 20 MEQ PO TBCR
20.0000 meq | EXTENDED_RELEASE_TABLET | Freq: Two times a day (BID) | ORAL | Status: DC
  Administered 2014-05-23 – 2014-05-25 (×5): 20 meq via ORAL
  Filled 2014-05-23 (×6): qty 1

## 2014-05-23 MED ORDER — SODIUM CHLORIDE 0.9 % IJ SOLN
3.0000 mL | Freq: Two times a day (BID) | INTRAMUSCULAR | Status: DC
  Administered 2014-05-23 – 2014-05-25 (×4): 3 mL via INTRAVENOUS

## 2014-05-23 MED ORDER — ONDANSETRON HCL 4 MG PO TABS: 4.0000 mg | ORAL_TABLET | Freq: Four times a day (QID) | ORAL | Status: DC | PRN

## 2014-05-23 MED ORDER — TRAMADOL HCL 50 MG PO TABS
50.0000 mg | ORAL_TABLET | ORAL | Status: DC | PRN
  Administered 2014-05-23: 50 mg via ORAL
  Filled 2014-05-23: qty 1

## 2014-05-23 MED ORDER — BISACODYL 5 MG PO TBEC: 10.0000 mg | DELAYED_RELEASE_TABLET | Freq: Every day | ORAL | Status: DC | PRN

## 2014-05-23 MED ORDER — FUROSEMIDE 40 MG PO TABS
40.0000 mg | ORAL_TABLET | Freq: Every day | ORAL | Status: AC
  Administered 2014-05-23 – 2014-05-25 (×3): 40 mg via ORAL
  Filled 2014-05-23 (×3): qty 1

## 2014-05-23 MED ORDER — FAMOTIDINE 20 MG PO TABS
20.0000 mg | ORAL_TABLET | Freq: Two times a day (BID) | ORAL | Status: DC
  Administered 2014-05-23 – 2014-05-25 (×5): 20 mg via ORAL
  Filled 2014-05-23 (×8): qty 1

## 2014-05-23 MED ORDER — MOVING RIGHT ALONG BOOK
Freq: Once | Status: AC
  Administered 2014-05-23: 13:00:00
  Filled 2014-05-23: qty 1

## 2014-05-23 MED ORDER — ASPIRIN EC 325 MG PO TBEC
325.0000 mg | DELAYED_RELEASE_TABLET | Freq: Every day | ORAL | Status: DC
  Administered 2014-05-24: 325 mg via ORAL
  Filled 2014-05-23 (×3): qty 1

## 2014-05-23 MED ORDER — BISACODYL 10 MG RE SUPP: 10.0000 mg | Freq: Every day | RECTAL | Status: DC | PRN

## 2014-05-23 NOTE — Progress Notes (Signed)
Pt due to void at 8pm, pt is asymptomatic.  Bladder scanner indicated 126 ml, pt assisted to bathroom and spontaneusly voided 100 ml.  Pt encouraged to drink fluids.  RN will continue to monitor and encourage bathroom visit. Call light in reach.   Newt Minion, RN

## 2014-05-23 NOTE — Progress Notes (Addendum)
2 Days Post-Op Procedure(s) (LRB): CORONARY ARTERY BYPASS GRAFTING (CABG) (N/A) INTRAOPERATIVE TRANSESOPHAGEAL ECHOCARDIOGRAM (N/A) Subjective:  No complaints.  Objective: Vital signs in last 24 hours: Temp:  [97.4 F (36.3 C)-99.1 F (37.3 C)] 97.8 F (36.6 C) (11/18 0734) Pulse Rate:  [79-92] 79 (11/18 0700) Cardiac Rhythm:  [-] Atrial paced;Normal sinus rhythm (11/17 2000) Resp:  [9-25] 23 (11/18 0700) BP: (87-118)/(43-72) 99/72 mmHg (11/18 0700) SpO2:  [94 %-100 %] 94 % (11/18 0700) Arterial Line BP: (77-122)/(51-74) 77/74 mmHg (11/17 1100) Weight:  [69.8 kg (153 lb 14.1 oz)] 69.8 kg (153 lb 14.1 oz) (11/18 0500)  Hemodynamic parameters for last 24 hours: PAP: (22-36)/(8-17) 25/10 mmHg  Intake/Output from previous day: 11/17 0701 - 11/18 0700 In: 1952.5 [P.O.:480; I.V.:1422.5; IV Piggyback:50] Out: 1450 [Urine:1400; Chest Tube:50] Intake/Output this shift:    General appearance: alert and cooperative Neurologic: intact Heart: regular rate and rhythm, S1, S2 normal, no murmur, click, rub or gallop Lungs: diminished breath sounds bibasilar Extremities: extremities normal, atraumatic, no cyanosis or edema Wound: dressing dry  Lab Results:  Recent Labs  05/22/14 1642 05/23/14 0428  WBC 7.7 6.8  HGB 9.0* 8.5*  HCT 27.8* 26.8*  PLT 100* 92*   BMET:  Recent Labs  05/22/14 0353 05/22/14 1631 05/22/14 1642 05/23/14 0428  NA 141 137  --  138  Roy 4.6 4.3  --  3.9  CL 108 104  --  107  CO2 21  --   --  25  GLUCOSE 134* 125*  --  88  BUN 8 7  --  9  CREATININE 0.64 0.50 0.62 0.63  CALCIUM 8.4  --   --  8.8    PT/INR:  Recent Labs  05/21/14 1417  LABPROT 17.8*  INR 1.46   ABG    Component Value Date/Time   PHART 7.303* 05/21/2014 2008   HCO3 22.1 05/21/2014 2008   TCO2 22 05/22/2014 1631   ACIDBASEDEF 4.0* 05/21/2014 2008   O2SAT 96.0 05/21/2014 2008   CBG (last 3)   Recent Labs  05/22/14 1934 05/23/14 0004 05/23/14 0431  GLUCAP 144* 101*  90   CXR: bibasilar atelectasis  Assessment/Plan: S/P Procedure(s) (LRB): CORONARY ARTERY BYPASS GRAFTING (CABG) (N/A) INTRAOPERATIVE TRANSESOPHAGEAL ECHOCARDIOGRAM (N/A)  She has been hemodynamically stable. BP has been on the low side overnight but she was in bed and asleep. She was back on neo briefly. HR 60's. Will hold off on beta blocker and ARB for now. Mobilize Diuresis Diabetes control: glucose under good control. Will stop CBG's Thrombocytopenia: mild. Her platelet count in down a little from yesterday. Will stop lovenox for now and use SCD's for DVT prophylaxis.  Expected acute blood loss anemia: start iron. Plan for transfer to step-down: see transfer orders   LOS: 2 days    Kelli Roy 05/23/2014

## 2014-05-23 NOTE — Plan of Care (Signed)
Problem: Phase II - Intermediate Post-Op Goal: Maintain Hemodynamic Stability Outcome: Completed/Met Date Met:  05/23/14

## 2014-05-24 LAB — GLUCOSE, CAPILLARY: Glucose-Capillary: 99 mg/dL (ref 70–99)

## 2014-05-24 NOTE — Progress Notes (Signed)
Medicare Important Message given? YES  (If response is "NO", the following Medicare IM given date fields will be blank)  Date Medicare IM given: 05/24/14 Medicare IM given by:  Dahlia Client [employer]

## 2014-05-24 NOTE — Progress Notes (Signed)
Utilization review completed.  

## 2014-05-24 NOTE — Progress Notes (Signed)
CARDIAC REHAB PHASE I   PRE:  Rate/Rhythm: 70 SR  BP:  Supine:   Sitting: 118/64  Standing:    SaO2: 94%RA  MODE:  Ambulation: 700 ft   POST:  Rate/Rhythm: 73SR  BP:  Supine:   Sitting: 120/50  Standing:    SaO2: 94%RA 1030-1102 Pt walked 700 ft with rolling walker with steady gait. Tolerated well. To recliner after walk. Discussed CRP 2 and pt to make decision as to where she wants to go-- [place] or [place] and let us know tomorrow.    Graylon Good, RN BSN  05/24/2014 10:57 AM

## 2014-05-24 NOTE — Progress Notes (Signed)
3 Days Post-Op Procedure(s) (LRB): CORONARY ARTERY BYPASS GRAFTING (CABG) (N/A) INTRAOPERATIVE TRANSESOPHAGEAL ECHOCARDIOGRAM (N/A) Subjective:  Mild incisional soreness  Bowels working overnight  Objective: Vital signs in last 24 hours: Temp:  [97.4 F (36.3 C)-98 F (36.7 C)] 98 F (36.7 C) (11/19 0309) Pulse Rate:  [55-72] 72 (11/19 0309) Cardiac Rhythm:  [-] Atrial paced (11/18 2100) Resp:  [12-22] 18 (11/19 0309) BP: (75-122)/(39-60) 122/57 mmHg (11/19 0309) SpO2:  [91 %-100 %] 91 % (11/19 0309) Weight:  [70.126 kg (154 lb 9.6 oz)] 70.126 kg (154 lb 9.6 oz) (11/19 0309)  Hemodynamic parameters for last 24 hours:    Intake/Output from previous day: 11/18 0701 - 11/19 0700 In: 600 [P.O.:540; I.V.:60] Out: 650 [Urine:650] Intake/Output this shift:    General appearance: alert and cooperative Heart: regular rate and rhythm, S1, S2 normal, no murmur, click, rub or gallop Lungs: rales bibasilar Extremities: extremities normal, atraumatic, no cyanosis or edema Wound: incision ok  Lab Results:  Recent Labs  05/22/14 1642 05/23/14 0428  WBC 7.7 6.8  HGB 9.0* 8.5*  HCT 27.8* 26.8*  PLT 100* 92*   BMET:  Recent Labs  05/22/14 0353 05/22/14 1631 05/22/14 1642 05/23/14 0428  NA 141 137  --  138  K 4.6 4.3  --  3.9  CL 108 104  --  107  CO2 21  --   --  25  GLUCOSE 134* 125*  --  88  BUN 8 7  --  9  CREATININE 0.64 0.50 0.62 0.63  CALCIUM 8.4  --   --  8.8    PT/INR:  Recent Labs  05/21/14 1417  LABPROT 17.8*  INR 1.46   ABG    Component Value Date/Time   PHART 7.303* 05/21/2014 2008   HCO3 22.1 05/21/2014 2008   TCO2 22 05/22/2014 1631   ACIDBASEDEF 4.0* 05/21/2014 2008   O2SAT 96.0 05/21/2014 2008   CBG (last 3)   Recent Labs  05/23/14 1633 05/23/14 2149 05/24/14 0629  GLUCAP 133* 129* 99    Assessment/Plan: S/P Procedure(s) (LRB): CORONARY ARTERY BYPASS GRAFTING (CABG) (N/A) INTRAOPERATIVE TRANSESOPHAGEAL ECHOCARDIOGRAM (N/A)   She is hemodynamically stable and HR in the 75' s so will stop pacing and observe. Hold off on beta blocker today and reevaluate tomorrow. Mobilize Diuresis She may be able to go home tomorrow if nothing changes. I don't think she will need a Lifevest since he EF looks significantly better in the OR around 50%.   LOS: 3 days    BARTLE,BRYAN K 05/24/2014

## 2014-05-24 NOTE — Plan of Care (Signed)
Problem: Surgery Discharge Goal: Pain controlled with appropriate interventions Outcome: Progressing

## 2014-05-25 MED ORDER — FERROUS GLUCONATE 324 (38 FE) MG PO TABS: 324.0000 mg | ORAL_TABLET | Freq: Two times a day (BID) | ORAL | Status: DC

## 2014-05-25 MED ORDER — LOSARTAN POTASSIUM 25 MG PO TABS
25.0000 mg | ORAL_TABLET | Freq: Every day | ORAL | Status: DC
  Filled 2014-05-25: qty 1

## 2014-05-25 MED ORDER — LOSARTAN POTASSIUM 25 MG PO TABS: 25.0000 mg | ORAL_TABLET | Freq: Every day | ORAL | Status: DC

## 2014-05-25 MED ORDER — CLOPIDOGREL BISULFATE 75 MG PO TABS
75.0000 mg | ORAL_TABLET | Freq: Every day | ORAL | Status: DC
  Filled 2014-05-25 (×2): qty 1

## 2014-05-25 MED ORDER — OXYCODONE HCL 5 MG PO TABS: 5.0000 mg | ORAL_TABLET | ORAL | Status: DC | PRN

## 2014-05-25 MED ORDER — CLOPIDOGREL BISULFATE 75 MG PO TABS: 75.0000 mg | ORAL_TABLET | Freq: Every day | ORAL | Status: DC

## 2014-05-25 MED ORDER — ASPIRIN EC 81 MG PO TBEC
81.0000 mg | DELAYED_RELEASE_TABLET | Freq: Every day | ORAL | Status: DC
  Filled 2014-05-25: qty 1

## 2014-05-25 NOTE — Discharge Instructions (Signed)
Endoscopic Saphenous Vein Harvesting °Care After °Refer to this sheet in the next few weeks. These instructions provide you with information on caring for yourself after your procedure. Your health care provider may also give you more specific instructions. Your treatment has been planned according to current medical practices, but problems sometimes occur. Call your health care provider if you have any problems or questions after your procedure. °HOME CARE INSTRUCTIONS °Medicine °· Take whatever pain medicine your surgeon prescribes. Follow the directions carefully. Do not take over-the-counter pain medicine unless your surgeon says it is okay. Some pain medicine can cause bleeding problems for several weeks after surgery. °· Follow your surgeon's instructions about driving. You will probably not be permitted to drive after heart surgery. °· Take any medicines your surgeon prescribes. Any medicines you took before your heart surgery should be checked with your health care provider before you start taking them again. °Wound care °· If your surgeon has prescribed an elastic bandage or stocking, ask how long you should wear it. °· Check the area around your surgical cuts (incisions) whenever your bandages (dressings) are changed. Look for any redness or swelling. °· You will need to return to have the stitches (sutures) or staples taken out. Ask your surgeon when to do that. °· Ask your surgeon when you can shower or bathe. °Activity °· Try to keep your legs raised when you are sitting. °· Do any exercises your health care providers have given you. These may include deep breathing exercises, coughing, walking, or other exercises. °SEEK MEDICAL CARE IF: °· You have any questions about your medicines. °· You have more leg pain, especially if your pain medicine stops working. °· New or growing bruises develop on your leg. °· Your leg swells, feels tight, or becomes red. °· You have numbness in your leg. °SEEK IMMEDIATE  MEDICAL CARE IF: °· Your pain gets much worse. °· Blood or fluid leaks from any of the incisions. °· Your incisions become warm, swollen, or red. °· You have chest pain. °· You have trouble breathing. °· You have a fever. °· You have more pain near your leg incision. °MAKE SURE YOU: °· Understand these instructions. °· Will watch your condition. °· Will get help right away if you are not doing well or get worse. °Document Released: 03/04/2011 Document Revised: 06/27/2013 Document Reviewed: 03/04/2011 °ExitCare® Patient Information ©2015 [employer]. This information is not intended to replace advice given to you by your health care provider. Make sure you discuss any questions you have with your health care provider. °Coronary Artery Bypass Grafting, Care After °These instructions give you information on caring for yourself after your procedure. Your doctor may also give you more specific instructions. Call your doctor if you have any problems or questions after your procedure.  °HOME CARE °· Only take medicine as told by your doctor. Take medicines exactly as told. Do not stop taking medicines or start any new medicines without talking to your doctor first. °· Take your pulse as told by your doctor. °· Do deep breathing as told by your doctor. Use your breathing device (incentive spirometer), if given, to practice deep breathing several times a day. Support your chest with a pillow or your arms when you take deep breaths or cough. °· Keep the area clean, dry, and protected where the surgery cuts (incisions) were made. Remove bandages (dressings) only as told by your doctor. If strips were applied to surgical area, do not take them off. They fall off   on their own. °· Check the surgery area daily for puffiness (swelling), redness, or leaking fluid. °· If surgery cuts were made in your legs: °· Avoid crossing your legs. °· Avoid sitting for long periods of time. Change positions every 30 minutes. °· Raise your legs  when you are sitting. Place them on pillows. °· Wear stockings that help keep blood clots from forming in your legs (compression stockings). °· Only take sponge baths until your doctor says it is okay to take showers. Pat the surgery area dry. Do not rub the surgery area with a washcloth or towel. Do not bathe, swim, or use a hot tub until your doctor says it is okay. °· Eat foods that are high in fiber. These include raw fruits and vegetables, whole grains, beans, and nuts. Choose lean meats. Avoid canned, processed, and fried foods. °· Drink enough fluids to keep your pee (urine) clear or pale yellow. °· Weigh yourself every day. °· Rest and limit activity as told by your doctor. You may be told to: °· Stop any activity if you have chest pain, shortness of breath, changes in heartbeat, or dizziness. Get help right away if this happens. °· Move around often for short amounts of time or take short walks as told by your doctor. Gradually become more active. You may need help to strengthen your muscles and build endurance. °· Avoid lifting, pushing, or pulling anything heavier than 10 pounds (4.5 kg) for at least 6 weeks after surgery. °· Do not drive until your doctor says it is okay. °· Ask your doctor when you can go back to work. °· Ask your doctor when you can begin sexual activity again. °· Follow up with your doctor as told. °GET HELP IF: °· You have puffiness, redness, more pain, or fluid draining from the incision site. °· You have a fever. °· You have puffiness in your ankles or legs. °· You have pain in your legs. °· You gain 2 or more pounds (0.9 kg) a day. °· You feel sick to your stomach (nauseous) or throw up (vomit). °· You have watery poop (diarrhea). °GET HELP RIGHT AWAY IF: °· You have chest pain that goes to your jaw or arms. °· You have shortness of breath. °· You have a fast or irregular heartbeat. °· You notice a "clicking" in your breastbone when you move. °· You have numbness or weakness in  your arms or legs. °· You feel dizzy or light-headed. °MAKE SURE YOU: °· Understand these instructions. °· Will watch your condition. °· Will get help right away if you are not doing well or get worse. °Document Released: 06/27/2013 Document Reviewed: 06/27/2013 °ExitCare® Patient Information ©2015 [employer]. This information is not intended to replace advice given to you by your health care provider. Make sure you discuss any questions you have with your health care provider. ° °

## 2014-05-25 NOTE — Plan of Care (Signed)
Problem: Surgery Discharge Goal: Pain controlled with appropriate interventions Outcome: Adequate for Discharge

## 2014-05-25 NOTE — Progress Notes (Signed)
119/20/15 Nursing note EPW pulled per protocol and as ordered. All ends intact. Patient reminded to lie supine approximately one hour. Patient tolerated well BP 127/65 heart rate 79 on monitor. Frequent vital signs completed as ordered will continue to closely monitor patient. Florrie Ramires, Bettina Gavia RN

## 2014-05-25 NOTE — Progress Notes (Addendum)
[address]       [place] [address]             [phone]      4 Days Post-Op Procedure(s) (LRB): CORONARY ARTERY BYPASS GRAFTING (CABG) (N/A) INTRAOPERATIVE TRANSESOPHAGEAL ECHOCARDIOGRAM (N/A) Subjective: Feels well overall  Objective: Vital signs in last 24 hours: Temp:  [98.2 F (36.8 C)-98.9 F (37.2 C)] 98.9 F (37.2 C) (11/20 0420) Pulse Rate:  [75-80] 78 (11/20 0420) Cardiac Rhythm:  [-] Normal sinus rhythm (11/19 2040) Resp:  [17-19] 17 (11/20 0420) BP: (101-138)/(57-61) 138/60 mmHg (11/20 0420) SpO2:  [93 %-94 %] 94 % (11/20 0420) Weight:  [152 lb 14.4 oz (69.355 kg)] 152 lb 14.4 oz (69.355 kg) (11/20 0420)  Hemodynamic parameters for last 24 hours:    Intake/Output from previous day: 11/19 0701 - 11/20 0700 In: 480 [P.O.:480] Out: 1000 [Urine:1000] Intake/Output this shift:    General appearance: alert, cooperative and no distress Heart: regular rate and rhythm Lungs: clear to auscultation bilaterally Abdomen: benign Extremities: minor right leg edema Wound: incis healing well  Lab Results:  Recent Labs  05/22/14 1642 05/23/14 0428  WBC 7.7 6.8  HGB 9.0* 8.5*  HCT 27.8* 26.8*  PLT 100* 92*   BMET:  Recent Labs  05/22/14 1631 05/22/14 1642 05/23/14 0428  NA 137  --  138  K 4.3  --  3.9  CL 104  --  107  CO2  --   --  25  GLUCOSE 125*  --  88  BUN 7  --  9  CREATININE 0.50 0.62 0.63  CALCIUM  --   --  8.8    PT/INR: No results for input(s): LABPROT, INR in the last 72 hours. ABG    Component Value Date/Time   PHART 7.303* 05/21/2014 2008   HCO3 22.1 05/21/2014 2008   TCO2 22 05/22/2014 1631   ACIDBASEDEF 4.0* 05/21/2014 2008   O2SAT 96.0 05/21/2014 2008   CBG (last 3)   Recent Labs  05/23/14 1633 05/23/14 2149 05/24/14 0629  GLUCAP 133* 129* 99    Meds Scheduled Meds: . aspirin EC  325 mg Oral Daily  . Chlorhexidine Gluconate Cloth  6 each Topical Daily  . docusate sodium  200 mg Oral  Daily  . famotidine  20 mg Oral BID  . ferrous gluconate  324 mg Oral BID WC  . furosemide  40 mg Oral Daily  . mupirocin ointment  1 application Nasal BID  . potassium chloride  20 mEq Oral BID  . sodium chloride  3 mL Intravenous Q12H   Continuous Infusions:  PRN Meds:.sodium chloride, acetaminophen, bisacodyl **OR** bisacodyl, ondansetron **OR** ondansetron (ZOFRAN) IV, oxyCODONE, sodium chloride, traMADol  Xrays No results found.  Assessment/Plan: S/P Procedure(s) (LRB): CORONARY ARTERY BYPASS GRAFTING (CABG) (N/A) INTRAOPERATIVE TRANSESOPHAGEAL ECHOCARDIOGRAM (N/A)  1 rhythm stable- will remove epw's this am 2 currently on no beta blocker ore ACE/ARB- will discuss with MD 3 poss start statin vs other agent but did have significant spike in LFT's with Lipitor 4 Will discuss restart Brilinta vs only ASA 5 poss home later this afternoon    LOS: 4 days    GOLD,WAYNE E 05/25/2014  Addendum : D/W surgeon and will place on plavix/baby asa, low dose cozaar. No statin for now. No beta blocker for now.   Chart reviewed, patient examined, agree with above. She looks good and has been ambulating well. No arrhythmias. Discussed with Dr. Martinique and will send  her home on Plavix 75 and ASA 81 daily. Resume Cozaar now since BP is rising and will consider beta blocker when we see her back in the office.

## 2014-05-25 NOTE — Progress Notes (Signed)
1020-1100 Cardiac Rehab Completed discharge education with pt and husband. They voice understanding. Pt agrees to [facility]. CRP in [place] or [facility]. I will send referrals to both places, so that she can decide later. I encouraged pt to watch recovery from heart surgery video before discharge. Deon Pilling, RN 05/25/2014 11:08 AM

## 2014-05-25 NOTE — Discharge Summary (Signed)
Physician Discharge Summary  Patient ID: Kelli Roy MRN: [id number] DOB/AGE: 69/30/1947 69 y.o.  Admit date: 05/21/2014 Discharge date: 05/25/2014  Admission Diagnoses: CAD  Discharge Diagnoses:  Active Problems:   S/P CABG x 4  Patient Active Problem List   Diagnosis Date Noted  . 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 Hyperlipidemia 03/12/2014  . CAD (coronary artery disease), native coronary artery 03/12/2014  . Cardiomyopathy, ischemic 03/12/2014  . Acute systolic heart failure 76/22/6333  . ST elevation myocardial infarction (STEMI) involving left anterior descending (LAD) coronary artery with complication 54/56/2563  . DYSPNEA 07/03/2009  . HYPERTENSION 06/19/2009  . INSOMNIA 06/19/2009  . MALAISE AND FATIGUE 06/19/2009  . CHEST PAIN UNSPECIFIED 06/19/2009    HPI:   The patient is a 68 year old woman with hypertension and hyperlipidemia who presented on 03/07/2014 with a 2 day history of intermittent epigastric and lower substernal chest pain. In the ER her troponin was negative and her ECG showed no new changes. She was given a GI cocktail and her symptoms resolved so she was discharged. The following morning she developed recurrent pain in her chest associated with nausea and shortness of breath. She returned to the ER and her ECG showed anterior and inferior ST elevation with a troponin of 0.07. She was taken to the cath lab as a code STEMI and the culprit was a occluded proximal LAD. She also had 50-70% distal LM, 70-80% ostial LAD, 50-70% ostial LCX and diffuse 50% proximal RCA disease. The LAD was opened with PCI with BMS with a good result. The LVEF post- PCI on ventriculogram was 25-30% with moderate MR. There was akinesis of the mid anterior and distal inferior walls with dyskinesis of the distal anterior wall and apex. Her troponin later that day was > 20. She developed some pulmonary edema that night that responded to diuretics. Her ECG on 03/10/2014, 3 days after he PCI showed  diffuse anterolateral ST changes. She was started on Coreg and Cozaar and maintained on Brilinta. She had a life vest placed at discharge. She says that she has been feeling fairly good but still having a little substernal chest discomfort at times. She was scheduled for surgery on 04/20/2014 but her preop LFT's were markedly elevated due to the statin she was placed on during her initial hospitalization for anterior MI. Her surgery was cancelled and the statin was stopped. Her follow-up LFT's have progressively improved and are almost back to normal. She was readmitted for her procedure.   Past Medical History  Diagnosis Date  . Hypertension   . Hyperlipidemia   . Malaise and fatigue   . Insomnia   . Family history of colon cancer   . Family history of cardiovascular disease   . History of long-term treatment with high-risk medication   . Sinus headache   . Alopecia   . CAD (coronary artery disease)     a. 03/2014 Ant STEMI/PCI: LM 50-70d, LAD 70-80ost, 100p (2.75x20 Veriflex BMS), LCX 50-70ost, 40-50p, RCA dom - 50p/m, EF 25-30%, mid ant/dist inf AK, dist ant/apical DK, mod MR.  . Ischemic cardiomyopathy     a. 03/2014 Echo: EF 25-30%, mid-apical/anteroseptal and inf AK, Gr 2 DD, ? apical thrombus, Mild MR.-->Discharged with LifeVest.  . Chronic systolic CHF (congestive heart failure)     a. 03/2014 Echo: EF 25-30%.    Past Surgical History  Procedure Laterality Date  . Cardiac catheterization  2001    showed no blockages  . Cystectomy  1998    fibroid cysts  . 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 Cervical spine surgery  09/27/2003    C6-67 servical fusion  . Dilation and curettage of uterus  1976    2nd to miscarriage  . Cataract extraction w/phaco Left  01/04/2014    Procedure: CATARACT EXTRACTION PHACO AND INTRAOCULAR LENS PLACEMENT (IOC); Surgeon: Tonny Branch, MD; Location: [facility]; Service: Ophthalmology; Laterality: Left; CDE:5.65  . Cataract extraction w/phaco Right 01/29/2014    Procedure: CATARACT EXTRACTION PHACO AND INTRAOCULAR LENS PLACEMENT RIGHT EYE CDE=4.49; Surgeon: Tonny Branch, MD; Location: [facility]; Service: Ophthalmology; Laterality: Right;    Family History  Problem Relation Age of Onset  . Stroke Mother   . Heart attack Mother   . Peripheral vascular disease Father     had stent in leg  . Colon cancer Father   . Atrial fibrillation Father     with pacemaker  . Thyroid disease Father   . Pancreatic cancer Brother   . Coronary artery disease Brother     with Stent  . Coronary artery disease Brother     with CABG in his 58's  . Thyroid disease Brother   . Thyroid disease Sister     Social History History  Substance Use Topics  . Smoking status: Never Smoker   . Smokeless tobacco: Never Used  . Alcohol Use: Yes     Comment: rare glass of wine    Current Outpatient Prescriptions  Medication Sig Dispense Refill  . aspirin EC 81 MG tablet Take 81 mg by mouth daily.    Marland Kitchen atorvastatin (LIPITOR) 80 MG tablet Take 1 tablet (80 mg total) by mouth daily at 6 PM. 30 tablet 6  . carvedilol (COREG) 3.125 MG tablet Take 1 tablet (3.125 mg total) by mouth 2 (two) times daily with a meal. 60 tablet 6  . isosorbide mononitrate (IMDUR) 30 MG 24 hr tablet Take 0.5 tablets (15 mg total) by mouth daily. 90 tablet 3  . losartan (COZAAR) 25 MG tablet Take 0.5 tablets (12.5 mg total) by mouth daily. 30 tablet 3  . ticagrelor (BRILINTA) 90 MG TABS tablet Take 1 tablet (90 mg total) by  mouth 2 (two) times daily. 60 tablet 2  . nitroGLYCERIN (NITROSTAT) 0.4 MG SL tablet Place 1 tablet (0.4 mg total) under the tongue every 5 (five) minutes as needed for chest pain. 25 tablet 3   No current facility-administered medications for this visit.    Allergies  Allergen Reactions  . Demerol [Meperidine]     Nausea   . Dexilant [Dexlansoprazole]     Rash   . Lisinopril     cough  . Meperidine Hcl   . Sulfonamide Derivatives     Rash   . Latex Rash    Discharged Condition: good  Hospital Course:  The patient was admitted and taken to the operating room on 05/21/2014 at which time she underwent the following procedure:  CARDIOVASCULAR SURGERY OPERATIVE NOTE  05/21/2014  Surgeon: Gaye Pollack, MD  First Assistant: Jadene Pierini, PA-C   Preoperative Diagnosis: Severe left main and multi-vessel coronary artery disease, s/p acute anteroseptal STEMI and PCI LAD.   Postoperative Diagnosis: Same   Procedure:  1. Median Sternotomy 2. Extracorporeal circulation 3. Coronary artery bypass grafting x 4   Left internal mammary graft to the LAD  SVG to diagonal  SVG to OM  SVG to RCA 4. Endoscopic vein  harvest from the right leg The patient was then transported to the surgical intensive care unit in critical but stable condition.  Postoperative hospital course:  The patient has done quite well. She has maintained stable hemodynamics and normal sinus rhythm. All routine lines, monitors and drainage devices have been discontinued in the standard fashion. She does have a mild postoperative thrombocytopenia and values have appeared to stabilize. She has a moderate acute blood loss anemia and has been started on oral iron supplement. Most recent hemoglobin is 8.5. She has a mild volume overload but has responded well to diuretics. Oxygen has been weaned and she maintains  good saturations on room air. Capillary blood glucoses under good control using standard protocols. She does not have a history of diabetes. Incisions are noted to be healing well without evidence of infection. She is tolerating diet. She is tolerating ambulation. Her overall status is felt to be quite stable for discharge on today's date.   Consults: None  Discharge Exam: Blood pressure 127/68, pulse 79, temperature 98.9 F (37.2 C), temperature source Oral, resp. rate 17, height 5\' 5"  (1.651 m), weight 152 lb 14.4 oz (69.355 kg), SpO2 90 %.  General appearance: alert, cooperative and no distress Heart: regular rate and rhythm Lungs: clear to auscultation bilaterally Abdomen: benign Extremities: minor right leg edema Wound: incis healing well  Disposition: 01-Home or Self Care  Discharge Instructions    Amb Referral to Cardiac Rehabilitation    Complete by:  As directed      Amb Referral to Cardiac Rehabilitation    Complete by:  As directed             Medication List    STOP taking these medications        atorvastatin 80 MG tablet  Commonly known as:  LIPITOR     carvedilol 3.125 MG tablet  Commonly known as:  COREG     isosorbide mononitrate 30 MG 24 hr tablet  Commonly known as:  IMDUR     nitroGLYCERIN 0.4 MG SL tablet  Commonly known as:  NITROSTAT     ticagrelor 90 MG Tabs tablet  Commonly known as:  BRILINTA      TAKE these medications        aspirin EC 81 MG tablet  Take 81 mg by mouth daily.     clopidogrel 75 MG tablet  Commonly known as:  PLAVIX  Take 1 tablet (75 mg total) by mouth daily with breakfast.     ferrous gluconate 324 MG tablet  Commonly known as:  FERGON  Take 1 tablet (324 mg total) by mouth 2 (two) times daily with a meal.     losartan 25 MG tablet  Commonly known as:  COZAAR  Take 1 tablet (25 mg total) by mouth daily.     oxyCODONE 5 MG immediate release tablet  Commonly known as:  Oxy IR/ROXICODONE  Take 1-2 tablets  (5-10 mg total) by mouth every 4 (four) hours as needed for severe pain.           Follow-up Information    Follow up with Gaye Pollack, MD.   Specialty:  Cardiothoracic Surgery   Why:  06/20/2014 at 12 noon to see the surgeon. Please obtain a chest x-ray [facility] at 11 AM. [facility] is located in the same office complex.   Contact information:   [address] [place] [address] [phone]       Follow up with  [facility].   Why:  05/30/2014 at 10:30 AM to see the nurse at Dr. Vivi Martens office for chest suture removal.      Follow up with Peter Martinique, MD.   Specialty:  Cardiology   Why:  Please arrange for appointment for 2 weeks to see her cardiologist.   Contact information:   [address] [place] [address] [phone]       The patient has been discharged on:   1.Beta Blocker:  Yes [   ]                              No   [ n  ]                              If No, reason: Early low postoperative heart rate.  2.Ace Inhibitor/ARB: Yes [  y ]                                     No  [    ]                                     If No, reason:  3.Statin:   Yes [   ]                  No  [ y  ]                  If No, reason: Elevated liver enzymes on Lipitor  4.Shela CommonsVelta Addison  Blue.Reese   ]                  No   [   ]                  If No, reason: Signed: GOLD,WAYNE E 05/25/2014, 1:10 PM

## 2014-05-28 ENCOUNTER — Telehealth: Payer: Self-pay | Admitting: Nurse Practitioner

## 2014-05-28 NOTE — Telephone Encounter (Signed)
Can you find out what her question is?

## 2014-05-28 NOTE — Telephone Encounter (Signed)
S/w pt developed a rash, thinks it's a reaction to plavix.  Pt s/w Dr. Cyndia Bent and stated Dr. Cyndia Bent would s/w Dr. Neita Garnet  Or call the office. Pt stated already taking benadryl. Pt wanted to know if should hold off on plavix till pt hears something. Cecille Rubin stated will send a note to Dr. Neita Garnet this am and to hold off on plavix till this pm when someone from the office calls.  Pt stated verbal understanding

## 2014-05-28 NOTE — Telephone Encounter (Signed)
HAVE CABG 05-21-14 WITH DR Cherie Ouch AND HAS MED QUESTIONS

## 2014-05-28 NOTE — Telephone Encounter (Signed)
S/w Verlee Rossetti , LPN, Dr. Theodis Sato nurse. Dr. Neita Garnet did not have a chance to review the message Truitt Merle, NP, sent to him.  Cecille Rubin stated to the pt to take Plavix right now and hold off tomorrow in the am till somebody from our office contacts patient to let pt know what the next step is. I will route this message to Verlee Rossetti, LPN.

## 2014-05-29 ENCOUNTER — Telehealth: Payer: Self-pay | Admitting: *Deleted

## 2014-05-29 NOTE — Telephone Encounter (Signed)
Spoke to patient Dr.Jordan advised to stop Plavix.

## 2014-05-29 NOTE — Telephone Encounter (Signed)
-----   Message from Burtis Junes, NP sent at 05/29/2014  7:28 AM EST ----- Please call with Dr. Doug Sou recommendation.  Cecille Rubin ----- Message -----    From: Peter M Martinique, MD    Sent: 05/28/2014   5:37 PM      To: Burtis Junes, NP  Just stop Plavix for now until rash resolves then we can decide to rechallenge or not.  Peter ----- Message -----    From: Burtis Junes, NP    Sent: 05/28/2014  10:54 AM      To: Peter M Martinique, MD  Susa Loffler morning,   This patient called here this morning - she has a rash - feels like it is from her Plavix - was told that Dr. Cyndia Bent was going to talk to you about this today.   She is wondering if she should stop Plavix and resume Brilinta - but if I remember correctly - she had a rash pre op that was assumed to be from the [place].   Your thoughts?  Cecille Rubin

## 2014-05-29 NOTE — Telephone Encounter (Signed)
S/w pt changed appointment to Truitt Merle, NP, is agreeable to plan will stop plavix for now.  Pt stated was up last night every hour and a half going to the bathroom and has loose stools feeling worn out today.  Does pt need to be worried.  Rash is a little better but still itching? I stated will all the changes with medication and rash pt's body is trying to adjust but keep eye on loose stools, rest today. If symptoms get worse let us know.  Stated I would Mickle Asper and if Cecille Rubin was agreeable would not call pt back. Will route to Truitt Merle, NP.

## 2014-05-29 NOTE — Addendum Note (Signed)
Addended by: Golden Hurter D on: 05/29/2014 11:08 AM   Modules accepted: Orders, Medications

## 2014-05-30 ENCOUNTER — Ambulatory Visit (INDEPENDENT_AMBULATORY_CARE_PROVIDER_SITE_OTHER): Payer: Self-pay

## 2014-05-30 DIAGNOSIS — I2510 Atherosclerotic heart disease of native coronary artery without angina pectoris: Secondary | ICD-10-CM

## 2014-05-30 DIAGNOSIS — Z4802 Encounter for removal of sutures: Secondary | ICD-10-CM

## 2014-05-30 NOTE — Progress Notes (Signed)
Removed 3 sutures from chest tube sites, No signs of infection and tolerated well.

## 2014-06-06 ENCOUNTER — Ambulatory Visit (INDEPENDENT_AMBULATORY_CARE_PROVIDER_SITE_OTHER): Payer: Self-pay | Admitting: Surgery

## 2014-06-06 ENCOUNTER — Ambulatory Visit (INDEPENDENT_AMBULATORY_CARE_PROVIDER_SITE_OTHER): Payer: Medicare Other | Admitting: Nurse Practitioner

## 2014-06-06 ENCOUNTER — Ambulatory Visit (INDEPENDENT_AMBULATORY_CARE_PROVIDER_SITE_OTHER): Payer: Medicare Other | Admitting: *Deleted

## 2014-06-06 ENCOUNTER — Encounter: Payer: Self-pay | Admitting: Nurse Practitioner

## 2014-06-06 ENCOUNTER — Encounter: Payer: Self-pay | Admitting: Surgery

## 2014-06-06 VITALS — BP 151/81 | HR 67 | Resp 18 | Ht 65.5 in | Wt 143.0 lb

## 2014-06-06 VITALS — BP 140/60 | HR 58 | Ht 65.5 in | Wt 143.4 lb

## 2014-06-06 DIAGNOSIS — Z23 Encounter for immunization: Secondary | ICD-10-CM | POA: Diagnosis not present

## 2014-06-06 DIAGNOSIS — I5021 Acute systolic (congestive) heart failure: Secondary | ICD-10-CM

## 2014-06-06 DIAGNOSIS — I2510 Atherosclerotic heart disease of native coronary artery without angina pectoris: Secondary | ICD-10-CM

## 2014-06-06 DIAGNOSIS — Z951 Presence of aortocoronary bypass graft: Secondary | ICD-10-CM | POA: Diagnosis not present

## 2014-06-06 DIAGNOSIS — I255 Ischemic cardiomyopathy: Secondary | ICD-10-CM

## 2014-06-06 LAB — CBC
HCT: 36.7 % (ref 36.0–46.0)
Hemoglobin: 11.9 g/dL — ABNORMAL LOW (ref 12.0–15.0)
MCHC: 32.4 g/dL (ref 30.0–36.0)
MCV: 94.4 fl (ref 78.0–100.0)
Platelets: 419 10*3/uL — ABNORMAL HIGH (ref 150.0–400.0)
RBC: 3.89 Mil/uL (ref 3.87–5.11)
RDW: 16.2 % — ABNORMAL HIGH (ref 11.5–15.5)
WBC: 6.2 10*3/uL (ref 4.0–10.5)

## 2014-06-06 NOTE — Patient Instructions (Signed)
We will be checking the following labs today BMET and CBC  Stay on your current medicines  Keep a check on your blood pressure  See me or Dr. Martinique in 4 weeks with fasting labs  I will send a note to cardiac rehab   I will talk with Dr. Martinique about your lipids  Call the [facility] office at [phone] if you have any questions, problems or concerns.

## 2014-06-06 NOTE — Progress Notes (Signed)
Kelli Roy Date of Birth: Mar 27, 1946 Medical Record #[id number]  History of Present Illness: Ms. Dykes is seen back today for a post hospital visit.  Seen for Dr. Martinique. She has HTN, HLD and family history of CAD.    Admitted in early September with chest pain. Had emergent cath showing moderate distal left main/ostial LAD disease along with an occluded proximal LAD. Also with moderate LCX and RCA disease. BMS to the LAD was performed. BMS used due to strong likelihood of needing CABG with her moderate multivessel disease. EF was 25 to 30% by echo and was concerning for an apical thrombus however repeat limited echo at discharge with contrast did not show any evidence of LV thrombus. Troponin peaked over 20. She was discharged on September 6th on Brilinta for a minimum of 4 weeks with ASA, statin and BB therapy. Her course was complicated by a rash - not clear as to the etiology but probably was her Brilinta.   I saw her several times since her original discharge - got her on low dose ARB.  Presented for her surgery in mid October but had marked elevation in her LFTs - surgery was placed on hold and she proceeded with CABG on 05/21/2014. She had CABG x 4 with LIMA to LAD, SVG to DX, SVG to OM and SVG to RCA. EF per the TEE preop showed an EF of 50%.   Post op course uneventful - was anemic. Did develop significant rash after being put on Plavix. Stopped after consultation with Dr. Martinique.   Comes in today. Here alone. Doing ok but weak and tired. Admits that she is probably doing too much. Some soreness in her chest. Stools are dark from the iron. Fatigued. No fever or chills. Did not want her flu shot while she was hospitalized. Eating ok. Bowels working ok. She is mostly worried about the vein harvesting site of her right leg - says she has lots of "knots".   Current Outpatient Prescriptions  Medication Sig Dispense Refill  . aspirin EC 81 MG tablet Take 81 mg by mouth daily.    .  ferrous gluconate (FERGON) 324 MG tablet Take 1 tablet (324 mg total) by mouth 2 (two) times daily with a meal. 30 tablet 3  . losartan (COZAAR) 25 MG tablet Take 1 tablet (25 mg total) by mouth daily. 30 tablet 1  . oxyCODONE (OXY IR/ROXICODONE) 5 MG immediate release tablet Take 1-2 tablets (5-10 mg total) by mouth every 4 (four) hours as needed for severe pain. 30 tablet 0   No current facility-administered medications for this visit.   Facility-Administered Medications Ordered in Other Visits  Medication Dose Route Frequency Provider Last Rate Last Dose  . fentaNYL (SUBLIMAZE) injection    Anesthesia Intra-op Zorita Pang, CRNA   100 mcg at 05/01/14 0636  . midazolam (VERSED) 5 MG/5ML injection    Anesthesia Intra-op Zorita Pang, CRNA   2 mg at 05/01/14 0630    Allergies  Allergen Reactions  . Demerol [Meperidine] Nausea Only  . Lipitor [Atorvastatin] Itching  . Lisinopril Cough  . Meperidine Hcl Nausea Only  . Plavix [Clopidogrel Bisulfate]     Rash   . Dexilant [Dexlansoprazole] Rash  . Latex Rash  . Sulfonamide Derivatives Rash    Past Medical History  Diagnosis Date  . Hypertension   . Hyperlipidemia   . Malaise and fatigue   . Insomnia   . Family history of colon cancer   .  Family history of cardiovascular disease   . History of long-term treatment with high-risk medication   . Sinus headache   . Alopecia   . CAD (coronary artery disease)     a. 03/2014 Ant STEMI/PCI: LM 50-70d, LAD 70-80ost, 100p (2.75x20 Veriflex BMS), LCX 50-70ost, 40-50p, RCA dom - 50p/m, EF 25-30%, mid ant/dist inf AK, dist ant/apical DK, mod MR.  . Ischemic cardiomyopathy     a. 03/2014 Echo: EF 25-30%, mid-apical/anteroseptal and inf AK, Gr 2 DD, ? apical thrombus, Mild MR.-->Discharged with LifeVest.  . Chronic systolic CHF (congestive heart failure)     a. 03/2014 Echo: EF 25-30%.    Past Surgical History  Procedure Laterality Date  . Cardiac catheterization  2001    showed no  blockages  . Cystectomy  1998    fibroid cysts  . 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 Cervical spine surgery  09/27/2003    C6-67 servical fusion  . Dilation and curettage of uterus  1976    2nd to miscarriage  . Cataract extraction w/phaco Left 01/04/2014    Procedure: CATARACT EXTRACTION PHACO AND INTRAOCULAR LENS PLACEMENT (IOC);  Surgeon: Tonny Branch, MD;  Location: [facility];  Service: Ophthalmology;  Laterality: Left;  CDE:5.65  . Cataract extraction w/phaco Right 01/29/2014    Procedure: CATARACT EXTRACTION PHACO AND INTRAOCULAR LENS PLACEMENT RIGHT EYE CDE=4.49;  Surgeon: Tonny Branch, MD;  Location: [facility];  Service: Ophthalmology;  Laterality: Right;  . Eye surgery    . Coronary artery bypass graft N/A 05/21/2014    Procedure: CORONARY ARTERY BYPASS GRAFTING (CABG);  Surgeon: Gaye Pollack, MD;  Location: [place];  Service: Open Heart Surgery;  Laterality: N/A;  . Intraoperative transesophageal echocardiogram N/A 05/21/2014    Procedure: INTRAOPERATIVE TRANSESOPHAGEAL ECHOCARDIOGRAM;  Surgeon: Gaye Pollack, MD;  Location: [facility];  Service: Open Heart Surgery;  Laterality: N/A;    History  Smoking status  . Never Smoker   Smokeless tobacco  . Never Used    History  Alcohol Use  . Yes    Comment: rare glass of wine    Family History  Problem Relation Age of Onset  . Stroke Mother   . Heart attack Mother   . Peripheral vascular disease Father     had stent in leg  . Colon cancer Father   . Atrial fibrillation Father     with pacemaker  . Thyroid disease Father   . Pancreatic cancer Brother   . Coronary artery disease Brother     with Stent  . Coronary artery disease Brother     with CABG in his 54's  . Thyroid disease Brother   . Thyroid disease Sister     Review of Systems: The review of systems is per the HPI.  All other systems were reviewed and are negative.  Physical Exam: BP 140/60 mmHg  Pulse 58  Ht 5' 5.5"  (1.664 m)  Wt 143 lb 6.4 oz (65.046 kg)  BMI 23.49 kg/m2 Patient is pleasant and in no acute distress. Skin is warm and dry. Color is normal.  HEENT is unremarkable. Normocephalic/atraumatic. PERRL. Sclera are nonicteric. Neck is supple. No masses. No JVD. Lungs are clear. Cardiac exam shows a regular rate and rhythm. Sternum looks ok. Abdomen is soft. Extremities are without edema. Her right leg vein harvesting incision looks ok - but she does have multiple knots tracking up the right thigh. Her leg is  not red or hot. It is sore to touch. Gait and ROM are intact. No gross neurologic deficits noted.  Wt Readings from Last 3 Encounters:  06/06/14 143 lb 6.4 oz (65.046 kg)  05/25/14 152 lb 14.4 oz (69.355 kg)  05/17/14 148 lb 1.6 oz (67.178 kg)    LABORATORY DATA/PROCEDURES:  Lab Results  Component Value Date   WBC 6.8 05/23/2014   HGB 8.5* 05/23/2014   HCT 26.8* 05/23/2014   PLT 92* 05/23/2014   GLUCOSE 88 05/23/2014   CHOL 264* 03/09/2014   TRIG 103 03/09/2014   HDL 60 03/09/2014   LDLCALC 183* 03/09/2014   ALT 198* 05/17/2014   AST 93* 05/17/2014   NA 138 05/23/2014   K 3.9 05/23/2014   CL 107 05/23/2014   CREATININE 0.63 05/23/2014   BUN 9 05/23/2014   CO2 25 05/23/2014   INR 1.46 05/21/2014   HGBA1C 6.1* 05/17/2014    BNP (last 3 results)  Recent Labs  03/07/14 2057  PROBNP 112.8   Pre-bypass TEE showed an improved LVEF of approximately 50% with septal akinesis. There was mild MR.  Post-bypass TEE unchanged.  Assessment / Plan: 1. CAD with prior anterior MI and PCI - now s/p CABG x 4. Doing ok. Probably overdoing it in the way of her activity level - she is cautioned about this. Recheck baseline labs today. She is worried about her leg - will stop by [facility] and let them check her. See back in 4 weeks with fasting labs. I have sent a note to cardiac rehab. Flu shot was given today. She will get a pneumonia shot by her PCP.   2. HLD - not on statin due to marked  abnormality with LFTS with Lipitor. Will get Dr. Doug Sou input.   3. Prior LV systolic dysfunction - EF on TEE had improved. No longer has her lifevest - probably repeat echo in about 3 months.   Patient is agreeable to this plan and will call if any problems develop in the interim.   Burtis Junes, RN, [facility] [address] [place], [place]  [id number] [phone]

## 2014-06-07 ENCOUNTER — Encounter: Payer: Self-pay | Admitting: Surgery

## 2014-06-07 LAB — BASIC METABOLIC PANEL
BUN: 10 mg/dL (ref 6–23)
CO2: 23 mEq/L (ref 19–32)
Calcium: 9.2 mg/dL (ref 8.4–10.5)
Chloride: 107 mEq/L (ref 96–112)
Creatinine, Ser: 0.7 mg/dL (ref 0.4–1.2)
GFR: 95.98 mL/min (ref >60.00)
Glucose, Bld: 86 mg/dL (ref 70–99)
Potassium: 4 mEq/L (ref 3.5–5.1)
Sodium: 139 mEq/L (ref 135–145)

## 2014-06-07 NOTE — Progress Notes (Signed)
       HPI:  Patient returns for postoperative follow-up having undergone CABG x 4 on 05/21/2014. She was seen by Truitt Merle, NP in the cardiology office for routine follow up and was concerned about her right thigh vein harvest site because she felt some bumps along the medial thigh. It has been non-tender. She is otherwise doing well.   Current Outpatient Prescriptions  Medication Sig Dispense Refill  . acetaminophen (TYLENOL) 500 MG tablet Take 500 mg by mouth every 6 (six) hours as needed.    Marland Kitchen aspirin EC 81 MG tablet Take 81 mg by mouth daily.    . ferrous gluconate (FERGON) 324 MG tablet Take 1 tablet (324 mg total) by mouth 2 (two) times daily with a meal. 30 tablet 3  . losartan (COZAAR) 25 MG tablet Take 1 tablet (25 mg total) by mouth daily. 30 tablet 1  . oxyCODONE (OXY IR/ROXICODONE) 5 MG immediate release tablet Take 1-2 tablets (5-10 mg total) by mouth every 4 (four) hours as needed for severe pain. 30 tablet 0   No current facility-administered medications for this visit.   Facility-Administered Medications Ordered in Other Visits  Medication Dose Route Frequency Provider Last Rate Last Dose  . fentaNYL (SUBLIMAZE) injection    Anesthesia Intra-op Zorita Pang, CRNA   100 mcg at 05/01/14 0636  . midazolam (VERSED) 5 MG/5ML injection    Anesthesia Intra-op Zorita Pang, CRNA   2 mg at 05/01/14 0630    Physical Exam: BP 151/81 mmHg  Pulse 67  Resp 18  Ht 5' 5.5" (1.664 m)  Wt 143 lb (64.864 kg)  BMI 23.43 kg/m2  SpO2 97% She looks well Cardiac exam shows a regular rate and rhythm with normal heart sounds. Lung exam is clear The chest incision is healing well and the sternum is stable The right thigh looks fine. There is slight bumpiness along the vein harvest tunnel as usual. There is no tenderness or erythema and no swelling. The incision is healing well.   Impression:  She is doing well following CABG.  Plan:  She will continue ambulation and start  cardiac rehab. I will see her back in two weeks for her regular follow up with a CXR.

## 2014-06-11 ENCOUNTER — Encounter: Payer: Medicare Other | Admitting: Physician Assistant

## 2014-06-14 ENCOUNTER — Encounter (HOSPITAL_COMMUNITY): Payer: Self-pay | Admitting: Cardiology

## 2014-06-19 ENCOUNTER — Other Ambulatory Visit: Payer: Self-pay | Admitting: Surgery

## 2014-06-19 ENCOUNTER — Encounter: Payer: Medicare Other | Admitting: Nurse Practitioner

## 2014-06-19 DIAGNOSIS — Z951 Presence of aortocoronary bypass graft: Secondary | ICD-10-CM

## 2014-06-20 ENCOUNTER — Other Ambulatory Visit: Payer: Self-pay | Admitting: *Deleted

## 2014-06-20 ENCOUNTER — Encounter: Payer: Self-pay | Admitting: Surgery

## 2014-06-20 ENCOUNTER — Ambulatory Visit
Admission: RE | Admit: 2014-06-20 | Discharge: 2014-06-20 | Disposition: A | Discharge status: Home / Self Care | Payer: Medicare Other | Source: Ambulatory Visit | Attending: Surgery | Admitting: Surgery

## 2014-06-20 ENCOUNTER — Ambulatory Visit (INDEPENDENT_AMBULATORY_CARE_PROVIDER_SITE_OTHER): Payer: Self-pay | Admitting: Surgery

## 2014-06-20 VITALS — BP 160/86 | HR 63 | Resp 20 | Ht 65.5 in | Wt 143.0 lb

## 2014-06-20 DIAGNOSIS — Z951 Presence of aortocoronary bypass graft: Secondary | ICD-10-CM

## 2014-06-20 DIAGNOSIS — J9811 Atelectasis: Secondary | ICD-10-CM | POA: Diagnosis not present

## 2014-06-20 DIAGNOSIS — J918 Pleural effusion in other conditions classified elsewhere: Secondary | ICD-10-CM | POA: Diagnosis not present

## 2014-06-20 DIAGNOSIS — I2510 Atherosclerotic heart disease of native coronary artery without angina pectoris: Secondary | ICD-10-CM

## 2014-06-20 DIAGNOSIS — R609 Edema, unspecified: Secondary | ICD-10-CM

## 2014-06-20 MED ORDER — POTASSIUM CHLORIDE CRYS ER 10 MEQ PO TBCR
20.0000 meq | EXTENDED_RELEASE_TABLET | Freq: Every day | ORAL | Status: DC
Start: 2014-06-20 — End: 2014-06-26

## 2014-06-20 MED ORDER — FUROSEMIDE 40 MG PO TABS: 40.0000 mg | ORAL_TABLET | Freq: Every day | ORAL | Status: DC

## 2014-06-20 NOTE — Progress Notes (Signed)
HPI:  Patient returns for postoperative follow-up having undergone CABG x 4 on 05/21/2014. Since I last saw her on 12/3 she has continued to improve. Her energy level is better and she is walking daily without chest pain or dyspnea. She has no cough. She has not noticed any peripheral edema. A recent Hgb on 12/2 was up to 11.9.  Current Outpatient Prescriptions  Medication Sig Dispense Refill  . acetaminophen (TYLENOL) 500 MG tablet Take 500 mg by mouth every 6 (six) hours as needed.    Marland Kitchen aspirin EC 81 MG tablet Take 81 mg by mouth daily.    . ferrous gluconate (FERGON) 324 MG tablet Take 1 tablet (324 mg total) by mouth 2 (two) times daily with a meal. 30 tablet 3  . losartan (COZAAR) 25 MG tablet Take 1 tablet (25 mg total) by mouth daily. 30 tablet 1  . furosemide (LASIX) 40 MG tablet Take 1 tablet (40 mg total) by mouth daily. 5 tablet 0  . potassium chloride (K-DUR,KLOR-CON) 10 MEQ tablet Take 2 tablets (20 mEq total) by mouth daily. Take x 5 days only 10 tablet 0   No current facility-administered medications for this visit.   Facility-Administered Medications Ordered in Other Visits  Medication Dose Route Frequency Provider Last Rate Last Dose  . fentaNYL (SUBLIMAZE) injection    Anesthesia Intra-op Zorita Pang, CRNA   100 mcg at 05/01/14 0636  . midazolam (VERSED) 5 MG/5ML injection    Anesthesia Intra-op Zorita Pang, CRNA   2 mg at 05/01/14 0630    CBC    Component Value Date/Time   WBC 6.2 06/06/2014 1231   RBC 3.89 06/06/2014 1231   HGB 11.9* 06/06/2014 1231   HCT 36.7 06/06/2014 1231   PLT 419.0* 06/06/2014 1231   MCV 94.4 06/06/2014 1231   MCH 30.1 05/23/2014 0428   MCHC 32.4 06/06/2014 1231   RDW 16.2* 06/06/2014 1231    BMET    Component Value Date/Time   NA 139 06/06/2014 1231   K 4.0 06/06/2014 1231   CL 107 06/06/2014 1231   CO2 23 06/06/2014 1231   GLUCOSE 86 06/06/2014 1231   BUN 10 06/06/2014 1231   CREATININE 0.7 06/06/2014 1231   CALCIUM 9.2 06/06/2014 1231   GFRNONAA 89* 05/23/2014 0428   GFRAA >90 05/23/2014 0428     Physical Exam: BP 160/86 mmHg  Pulse 63  Resp 20  Ht 5' 5.5" (1.664 m)  Wt 143 lb (64.864 kg)  BMI 23.43 kg/m2  SpO2 98%   She looks well Cardiac exam shows a regular rate and rhythm with normal heart sounds. Lung exam reveals decreased breath sounds at the left base. The chest incision is healing well and the sternum is stable The right leg incisions look good. There is no lower extremity edema.   CLINICAL DATA: CABG 4 weeks ago, some chest tightness and cough  EXAM: CHEST 2 VIEW  COMPARISON: Portable chest x-ray of 05/23/2014  FINDINGS: Opacity at the left lung base is consistent with a left pleural effusion of small to moderate volume with left basilar volume loss. The right lung is clear. Mild cardiomegaly is stable. Median sternotomy sutures appear intact. No bony abnormality is seen.  IMPRESSION: Small to moderate sized left pleural effusion with left basilar atelectasis.   Electronically Signed  By: Ivar Drape M.D.  On: 06/20/2014 11:37  Impression:  She continues to improve after surgery. She still has a small left pleural effusion and  I will start he on a brief course of lasix and KCL for 5 days to help resolve this. She has no peripheral edema.  Plan:  Lasix 40 mg daily and KCL 20 meq daily for 5 days. Return in two weeks with a CXR.

## 2014-06-26 ENCOUNTER — Encounter (HOSPITAL_COMMUNITY)
Admission: RE | Admit: 2014-06-26 | Discharge: 2014-06-26 | Disposition: A | Discharge status: Home / Self Care | Payer: Medicare Other | Source: Ambulatory Visit | Attending: Cardiology | Admitting: Cardiology

## 2014-06-26 ENCOUNTER — Encounter (HOSPITAL_COMMUNITY): Payer: Self-pay

## 2014-06-26 VITALS — BP 92/52 | HR 77 | Ht 65.0 in | Wt 140.0 lb

## 2014-06-26 DIAGNOSIS — Z951 Presence of aortocoronary bypass graft: Secondary | ICD-10-CM

## 2014-06-26 NOTE — Progress Notes (Signed)
Patient referred to Cardiac Rehab by Dr. Peter Martinique due to [facility] CABG (Z95.1) Dr. Martinique is her cardiologist and Dr. Minna Antis is her PCP.  During orientation advised patient on arrival and appointment times what to wear, what to do before, during and after exercise.  Reviewed attendance and class policy.  Talked about inclement weather and class consultation policy. Patient is scheduled to start cardiac Rehab on January 4th, 2016 at 1100 .  Patient was advised to come to class 5 minutes before class starts.  He was also given instructions on meeting with the dietician and attending the Family Structure classes. Pt is eager to get started.  Patient finished 6 minute walk test.

## 2014-06-26 NOTE — Patient Instructions (Signed)
Pt has finished orientation and is scheduled to start CR on January 4th 2016 at 11am . Pt has been instructed to arrive to class 15 minutes early for scheduled class. Pt has been instructed to wear comfortable clothing and shoes with rubber soles. Pt has been told to take their medications 1 hour prior to coming to class.  If the patient is not going to attend class, she has been instructed to call.

## 2014-07-03 ENCOUNTER — Other Ambulatory Visit: Payer: Self-pay | Admitting: Surgery

## 2014-07-03 DIAGNOSIS — I5021 Acute systolic (congestive) heart failure: Secondary | ICD-10-CM

## 2014-07-04 ENCOUNTER — Ambulatory Visit
Admission: RE | Admit: 2014-07-04 | Discharge: 2014-07-04 | Disposition: A | Discharge status: Home / Self Care | Payer: Medicare Other | Source: Ambulatory Visit | Attending: Surgery | Admitting: Surgery

## 2014-07-04 ENCOUNTER — Encounter: Payer: Self-pay | Admitting: Surgery

## 2014-07-04 ENCOUNTER — Ambulatory Visit (INDEPENDENT_AMBULATORY_CARE_PROVIDER_SITE_OTHER): Payer: Self-pay | Admitting: Surgery

## 2014-07-04 VITALS — BP 150/87 | HR 63 | Resp 20 | Ht 65.0 in | Wt 144.0 lb

## 2014-07-04 DIAGNOSIS — J90 Pleural effusion, not elsewhere classified: Secondary | ICD-10-CM | POA: Diagnosis not present

## 2014-07-04 DIAGNOSIS — Z951 Presence of aortocoronary bypass graft: Secondary | ICD-10-CM

## 2014-07-04 DIAGNOSIS — I5021 Acute systolic (congestive) heart failure: Secondary | ICD-10-CM

## 2014-07-04 DIAGNOSIS — J9811 Atelectasis: Secondary | ICD-10-CM | POA: Diagnosis not present

## 2014-07-04 DIAGNOSIS — Z23 Encounter for immunization: Secondary | ICD-10-CM | POA: Diagnosis not present

## 2014-07-04 DIAGNOSIS — I517 Cardiomegaly: Secondary | ICD-10-CM | POA: Diagnosis not present

## 2014-07-04 DIAGNOSIS — I2510 Atherosclerotic heart disease of native coronary artery without angina pectoris: Secondary | ICD-10-CM

## 2014-07-04 DIAGNOSIS — J948 Other specified pleural conditions: Secondary | ICD-10-CM

## 2014-07-04 NOTE — Progress Notes (Signed)
      HPI:  Patient returns for postoperative follow-up having undergone CABG x 4 on 05/21/2014. When I saw her two weeks ago she had a moderate left pleural effusion that was asymptomatic. I treated her with a 5 day course of lasix. She continues to do well with no shortness of breath and only mild right sided chest wall pain with deep breaths. She is walking well.  Current Outpatient Prescriptions  Medication Sig Dispense Refill  . acetaminophen (TYLENOL) 500 MG tablet Take 500 mg by mouth every 6 (six) hours as needed.    Marland Kitchen aspirin EC 81 MG tablet Take 81 mg by mouth daily.    Marland Kitchen losartan (COZAAR) 25 MG tablet Take 1 tablet (25 mg total) by mouth daily. 30 tablet 1   No current facility-administered medications for this visit.   Facility-Administered Medications Ordered in Other Visits  Medication Dose Route Frequency Provider Last Rate Last Dose  . fentaNYL (SUBLIMAZE) injection    Anesthesia Intra-op Zorita Pang, CRNA   100 mcg at 05/01/14 0636  . midazolam (VERSED) 5 MG/5ML injection    Anesthesia Intra-op Zorita Pang, CRNA   2 mg at 05/01/14 0630     Physical Exam: BP 150/87 mmHg  Pulse 63  Resp 20  Ht 5\' 5"  (1.651 m)  Wt 144 lb (65.318 kg)  BMI 23.96 kg/m2  SpO2 98% She looks well Cardiac exam shows a regular rate and rhythm with normal heart sounds. Lung exam reveals improved breath sounds at the left base. The chest incision is healing well and the sternum is stable The right leg incisions look good. There is no lower extremity edema.  Diagnostic Tests:  CLINICAL DATA: Shortness of breath.  EXAM: CHEST 2 VIEW  COMPARISON: 06/20/2014.  FINDINGS: Mediastinum and hilar structures are normal prior CABG mild cardiomegaly normal pulmonary vascularity. Mild left basilar subsegmental atelectasis. Improving left pleural effusion, mild residual. No pneumothorax. Prior cervical spine fusion.  IMPRESSION: 1. Prior CABG. Stable cardiomegaly. No pulmonary  venous congestion. 2. Mild left base subsegmental atelectasis. 3. Interim partial clearing of left pleural effusion.   Electronically Signed  By: Marcello Moores Register  On: 07/04/2014 12:17   Impression:  She continues to do well. The left pleural effusion is resolving.   Plan:  I will see her in 1 month with a CXR to be sure that the left pleural effusion resolves completely.

## 2014-07-09 ENCOUNTER — Encounter (HOSPITAL_COMMUNITY)
Admission: RE | Admit: 2014-07-09 | Discharge: 2014-07-09 | Disposition: A | Discharge status: Home / Self Care | Payer: Medicare Other | Source: Ambulatory Visit | Attending: Cardiology | Admitting: Cardiology

## 2014-07-09 DIAGNOSIS — I2510 Atherosclerotic heart disease of native coronary artery without angina pectoris: Secondary | ICD-10-CM | POA: Diagnosis not present

## 2014-07-09 DIAGNOSIS — Z951 Presence of aortocoronary bypass graft: Secondary | ICD-10-CM | POA: Diagnosis not present

## 2014-07-11 ENCOUNTER — Encounter (HOSPITAL_COMMUNITY)
Admission: RE | Admit: 2014-07-11 | Discharge: 2014-07-11 | Disposition: A | Discharge status: Home / Self Care | Payer: Medicare Other | Source: Ambulatory Visit | Attending: Cardiology | Admitting: Cardiology

## 2014-07-11 DIAGNOSIS — Z951 Presence of aortocoronary bypass graft: Secondary | ICD-10-CM | POA: Diagnosis not present

## 2014-07-11 DIAGNOSIS — I2510 Atherosclerotic heart disease of native coronary artery without angina pectoris: Secondary | ICD-10-CM | POA: Diagnosis not present

## 2014-07-13 ENCOUNTER — Encounter (HOSPITAL_COMMUNITY)
Admission: RE | Admit: 2014-07-13 | Discharge: 2014-07-13 | Disposition: A | Discharge status: Home / Self Care | Payer: Medicare Other | Source: Ambulatory Visit | Attending: Cardiology | Admitting: Cardiology

## 2014-07-13 DIAGNOSIS — I2510 Atherosclerotic heart disease of native coronary artery without angina pectoris: Secondary | ICD-10-CM | POA: Diagnosis not present

## 2014-07-13 DIAGNOSIS — Z951 Presence of aortocoronary bypass graft: Secondary | ICD-10-CM | POA: Diagnosis not present

## 2014-07-13 NOTE — Progress Notes (Signed)
Cardiac Rehabilitation Program Outcomes Report   Orientation:  06/26/14 Graduate Date:  tbd Discharge Date:  tbd # of sessions completed: 3  Cardiologist: P Martinique Family MD:  Francoise Ceo Time:  1100  A.  Exercise Program:  Tolerates exercise @ 3.40 METS for 15 minutes. Patient walked 3.38mets at pre walk test.   B.  Mental Health:  Good mental attitude  C.  Education/Instruction/Skills  Accurately checks own pulse.  Rest:  58  Exercise:  110 and Knows THR for exercise  Uses Perceived Exertion Scale and/or Dyspnea Scale  D.  Nutrition/Weight Control/Body Composition:  Adherence to prescribed nutrition program: good    E.  Blood Lipids    Lab Results  Component Value Date   CHOL 264* 03/09/2014   HDL 60 03/09/2014   LDLCALC 183* 03/09/2014   TRIG 103 03/09/2014   CHOLHDL 4.4 03/09/2014    F.  Lifestyle Changes:  Making positive lifestyle changes  G.  Symptoms noted with exercise:  Asymptomatic  Report Completed By:  Stevphen Rochester RN   Comments:  Patients first week note.

## 2014-07-16 ENCOUNTER — Encounter (HOSPITAL_COMMUNITY)
Admission: RE | Admit: 2014-07-16 | Discharge: 2014-07-16 | Disposition: A | Discharge status: Home / Self Care | Payer: Medicare Other | Source: Ambulatory Visit | Attending: Cardiology | Admitting: Cardiology

## 2014-07-16 ENCOUNTER — Telehealth: Payer: Self-pay | Admitting: Nurse Practitioner

## 2014-07-16 DIAGNOSIS — I2510 Atherosclerotic heart disease of native coronary artery without angina pectoris: Secondary | ICD-10-CM | POA: Diagnosis not present

## 2014-07-16 DIAGNOSIS — Z951 Presence of aortocoronary bypass graft: Secondary | ICD-10-CM | POA: Diagnosis not present

## 2014-07-16 NOTE — Telephone Encounter (Signed)
Pt called in and stated is getting teeth cleaned tomorrow and Dentist office needs to know how much antibiotic she needs to be on.  Please advise.

## 2014-07-16 NOTE — Telephone Encounter (Signed)
New Msg         Please contact pt, no details provided.

## 2014-07-16 NOTE — Telephone Encounter (Signed)
She does not have a cardiac indication for antibiotics. No history of valve replacement, congenital valve disorder.

## 2014-07-16 NOTE — Telephone Encounter (Signed)
Pt aware and agreeable. Will let Dentist office know.

## 2014-07-18 ENCOUNTER — Encounter (HOSPITAL_COMMUNITY)
Admission: RE | Admit: 2014-07-18 | Discharge: 2014-07-18 | Disposition: A | Discharge status: Home / Self Care | Payer: Medicare Other | Source: Ambulatory Visit | Attending: Cardiology | Admitting: Cardiology

## 2014-07-18 DIAGNOSIS — I2510 Atherosclerotic heart disease of native coronary artery without angina pectoris: Secondary | ICD-10-CM | POA: Diagnosis not present

## 2014-07-18 DIAGNOSIS — Z951 Presence of aortocoronary bypass graft: Secondary | ICD-10-CM | POA: Diagnosis not present

## 2014-07-19 ENCOUNTER — Encounter (HOSPITAL_COMMUNITY): Payer: Self-pay | Admitting: Surgery

## 2014-07-20 ENCOUNTER — Encounter (HOSPITAL_COMMUNITY)
Admission: RE | Admit: 2014-07-20 | Discharge: 2014-07-20 | Disposition: A | Discharge status: Home / Self Care | Payer: Medicare Other | Source: Ambulatory Visit | Attending: Cardiology | Admitting: Cardiology

## 2014-07-20 DIAGNOSIS — Z951 Presence of aortocoronary bypass graft: Secondary | ICD-10-CM | POA: Diagnosis not present

## 2014-07-20 DIAGNOSIS — I2510 Atherosclerotic heart disease of native coronary artery without angina pectoris: Secondary | ICD-10-CM | POA: Diagnosis not present

## 2014-07-23 ENCOUNTER — Encounter (HOSPITAL_COMMUNITY)
Admission: RE | Admit: 2014-07-23 | Discharge: 2014-07-23 | Disposition: A | Discharge status: Home / Self Care | Payer: Medicare Other | Source: Ambulatory Visit | Attending: Cardiology | Admitting: Cardiology

## 2014-07-23 DIAGNOSIS — Z951 Presence of aortocoronary bypass graft: Secondary | ICD-10-CM | POA: Diagnosis not present

## 2014-07-23 DIAGNOSIS — I2510 Atherosclerotic heart disease of native coronary artery without angina pectoris: Secondary | ICD-10-CM | POA: Diagnosis not present

## 2014-07-25 ENCOUNTER — Encounter (HOSPITAL_COMMUNITY)
Admission: RE | Admit: 2014-07-25 | Discharge: 2014-07-25 | Disposition: A | Discharge status: Home / Self Care | Payer: Medicare Other | Source: Ambulatory Visit | Attending: Cardiology | Admitting: Cardiology

## 2014-07-25 DIAGNOSIS — Z951 Presence of aortocoronary bypass graft: Secondary | ICD-10-CM | POA: Diagnosis not present

## 2014-07-25 DIAGNOSIS — I2510 Atherosclerotic heart disease of native coronary artery without angina pectoris: Secondary | ICD-10-CM | POA: Diagnosis not present

## 2014-07-26 ENCOUNTER — Encounter: Payer: Self-pay | Admitting: Cardiology

## 2014-07-26 ENCOUNTER — Ambulatory Visit (INDEPENDENT_AMBULATORY_CARE_PROVIDER_SITE_OTHER): Payer: Medicare Other | Admitting: Cardiology

## 2014-07-26 VITALS — BP 162/82 | HR 67 | Ht 65.5 in | Wt 145.2 lb

## 2014-07-26 DIAGNOSIS — E785 Hyperlipidemia, unspecified: Secondary | ICD-10-CM

## 2014-07-26 DIAGNOSIS — I255 Ischemic cardiomyopathy: Secondary | ICD-10-CM

## 2014-07-26 DIAGNOSIS — I5022 Chronic systolic (congestive) heart failure: Secondary | ICD-10-CM | POA: Insufficient documentation

## 2014-07-26 DIAGNOSIS — I10 Essential (primary) hypertension: Secondary | ICD-10-CM

## 2014-07-26 DIAGNOSIS — Z951 Presence of aortocoronary bypass graft: Secondary | ICD-10-CM

## 2014-07-26 DIAGNOSIS — I25119 Atherosclerotic heart disease of native coronary artery with unspecified angina pectoris: Secondary | ICD-10-CM

## 2014-07-26 LAB — HEPATIC FUNCTION PANEL
ALK PHOS: 107 U/L (ref 39–117)
ALT: 11 U/L (ref 0–35)
AST: 20 U/L (ref 0–37)
Albumin: 4.1 g/dL (ref 3.5–5.2)
BILIRUBIN DIRECT: 0.1 mg/dL (ref 0.0–0.3)
BILIRUBIN INDIRECT: 0.5 mg/dL (ref 0.2–1.2)
Total Bilirubin: 0.6 mg/dL (ref 0.2–1.2)
Total Protein: 7 g/dL (ref 6.0–8.3)

## 2014-07-26 LAB — LIPID PANEL
CHOLESTEROL: 238 mg/dL — AB (ref 0–200)
HDL: 56 mg/dL (ref >39)
LDL Cholesterol: 157 mg/dL — ABNORMAL HIGH (ref 0–99)
Total CHOL/HDL Ratio: 4.3 Ratio
Triglycerides: 123 mg/dL (ref <150)
VLDL: 25 mg/dL (ref 0–40)

## 2014-07-26 LAB — BASIC METABOLIC PANEL
BUN: 9 mg/dL (ref 6–23)
CALCIUM: 10.3 mg/dL (ref 8.4–10.5)
CO2: 25 mEq/L (ref 19–32)
CREATININE: 0.7 mg/dL (ref 0.50–1.10)
Chloride: 106 mEq/L (ref 96–112)
Glucose, Bld: 88 mg/dL (ref 70–99)
Potassium: 4.7 mEq/L (ref 3.5–5.3)
SODIUM: 141 meq/L (ref 135–145)

## 2014-07-26 NOTE — Progress Notes (Signed)
Kelli Roy Date of Birth: 1945-08-23 Medical Record #[id number]  History of Present Illness: Kelli Roy is seen today for follow up of CAD. She is a 69 yo WF who presented in September 2015 with an Anterior STEMI. She underwent emergent stenting of the proximal LAD occlusion with a BMS. She also had 50-70% left main disease, 70-80% ostial LAD disease and 50-70% ostial LCx disease. EF was 25-30%. She was placed on high dose lipitor therapy but in November was noted to have marked elevation of her transaminases and her statin was discontinued with improvement. In November she returned for CABG by Dr. Cyndia Bent with LIMA to the LAD, SVG to diag, SVG to OM, and SVG to RCA. TEE showed improvement in EF to 50%. She also developed a rash to both Brilinta and Plavix. Currently she is just on losartan and ASA. She is participating in [facility] and doing well. She reports her BP there is low. She does complain of chest wall pain. She denies angina or dyspnea. Energy level is good. No palpitations.    Medication List       This list is accurate as of: 07/26/14 12:03 PM.  Always use your most recent med list.               acetaminophen 500 MG tablet  Commonly known as:  TYLENOL  Take 500 mg by mouth every 6 (six) hours as needed.     aspirin EC 81 MG tablet  Take 81 mg by mouth daily.     losartan 25 MG tablet  Commonly known as:  COZAAR  Take 1 tablet (25 mg total) by mouth daily.         Allergies  Allergen Reactions  . Demerol [Meperidine] Nausea Only  . Lipitor [Atorvastatin] Itching  . Lisinopril Cough  . Meperidine Hcl Nausea Only  . Plavix [Clopidogrel Bisulfate]     Rash   . Dexilant [Dexlansoprazole] Rash  . Latex Rash  . Sulfonamide Derivatives Rash    Past Medical History  Diagnosis Date  . Hypertension   . Hyperlipidemia   . Malaise and fatigue   . Insomnia   . Family history of colon cancer   . Family history of cardiovascular disease   . History of long-term  treatment with high-risk medication   . Sinus headache   . Alopecia   . CAD (coronary artery disease)     a. 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 Cyndia Bent   . Ischemic cardiomyopathy     a. 03/2014 Echo: EF 25-30%, mid-apical/anteroseptal and inf AK, Gr 2 DD, ? apical thrombus, Mild MR.-->Discharged with LifeVest.  . Chronic systolic CHF (congestive heart failure)     a. 03/2014 Echo: EF 25-30%; Pre op TEE 05/2014 with EF of 50%    Past Surgical History  Procedure Laterality Date  . Cardiac catheterization  2001    showed no blockages  . Cystectomy  1998    fibroid cysts  . 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 Cervical spine surgery  09/27/2003    C6-67 servical fusion  . Dilation and curettage of uterus  1976    2nd to miscarriage  .  Cataract extraction w/phaco Left 01/04/2014    Procedure: CATARACT EXTRACTION PHACO AND INTRAOCULAR LENS PLACEMENT (IOC);  Surgeon: Tonny Branch, MD;  Location: [facility];  Service: Ophthalmology;  Laterality: Left;  CDE:5.65  . Cataract extraction w/phaco Right 01/29/2014    Procedure: CATARACT EXTRACTION PHACO AND INTRAOCULAR LENS PLACEMENT RIGHT EYE CDE=4.49;  Surgeon: Tonny Branch, MD;  Location: [facility];  Service: Ophthalmology;  Laterality: Right;  . Eye surgery    . Coronary artery bypass graft N/A 05/21/2014    Procedure: CORONARY ARTERY BYPASS GRAFTING (CABG);  Surgeon: Gaye Pollack, MD;  Location: [facility];  Service: Open Heart Surgery;  Laterality: N/A;  . Intraoperative transesophageal echocardiogram N/A 05/21/2014    Procedure: INTRAOPERATIVE TRANSESOPHAGEAL ECHOCARDIOGRAM;  Surgeon: Gaye Pollack, MD;  Location: [facility] OR;  Service: Open Heart Surgery;  Laterality: N/A;  . Left heart  catheterization with coronary angiogram N/A 03/08/2014    Procedure: LEFT HEART CATHETERIZATION WITH CORONARY ANGIOGRAM;  Surgeon: Haily Caley M Martinique, MD;  Location: [facility] CATH LAB;  Service: Cardiovascular;  Laterality: N/A;  . Percutaneous coronary stent intervention (pci-s)  03/08/2014    Procedure: PERCUTANEOUS CORONARY STENT INTERVENTION (PCI-S);  Surgeon: Kassity Woodson M Martinique, MD;  Location: [facility] CATH LAB;  Service: Cardiovascular;;  prox lad  bms    History   Social History  . Marital Status: Married    Spouse Name: N/A    Number of Children: N/A  . Years of Education: N/A   Social History Main Topics  . Smoking status: Never Smoker   . Smokeless tobacco: Never Used  . Alcohol Use: Yes     Comment: rare glass of wine  . Drug Use: No  . Sexual Activity: Yes    Birth Control/ Protection: Post-menopausal   Other Topics Concern  . None   Social History Narrative    Family History  Problem Relation Age of Onset  . Stroke Mother   . Heart attack Mother   . Peripheral vascular disease Father     had stent in leg  . Colon cancer Father   . Atrial fibrillation Father     with pacemaker  . Thyroid disease Father   . Pancreatic cancer Brother   . Coronary artery disease Brother     with Stent  . Coronary artery disease Brother     with CABG in his 15's  . Thyroid disease Brother   . Thyroid disease Sister     Review of Systems: As noted in HPI.  All other systems were reviewed and are negative.  Physical Exam: BP 162/82 mmHg  Pulse 67  Ht 5' 5.5" (1.664 m)  Wt 145 lb 3.2 oz (65.862 kg)  BMI 23.79 kg/m2 Filed Weights   07/26/14 1005  Weight: 145 lb 3.2 oz (65.862 kg)  GENERAL:  Well appearing WF in NAD HEENT:  PERRL, EOMI, sclera are clear. Oropharynx is clear. NECK:  No jugular venous distention, carotid upstroke brisk and symmetric, no bruits, no thyromegaly or adenopathy LUNGS:  Clear to auscultation bilaterally CHEST:  Unremarkable, sternotomy scar has healed well. HEART:   RRR,  PMI not displaced or sustained,S1 and S2 within normal limits, no S3, no S4: no clicks, no rubs, no murmurs ABD:  Soft, nontender. BS +, no masses or bruits. No hepatomegaly, no splenomegaly EXT:  2 + pulses throughout, no edema, no cyanosis no clubbing SKIN:  Warm and dry.  No rashes NEURO:  Alert and oriented x 3. Cranial nerves II through XII intact. PSYCH:  Cognitively intact    LABORATORY DATA:   Assessment / Plan: 1. CAD s/p anterior STEMI in Sept. 2015 treated with BMS of proximal LAD. S/p CABG November 2015. Good clinical recovery. Continue cardiac Rehab. Will repeat an Echo in one month. Continue ASA only at this point due to rash on Plavix and Brilinta.  2. Ischemic cardiomyopathy. Well compensated. EF improved by TEE at time of surgery. Will check Echo 3 months post op. Continue losartan.  3. Hyperlipidemia. Intolerance to high dose lipitor due to transaminitis. Will check fasting lab work today. If LFTs are normal will consider low dose Crestor with close follow up. If LFTs are still high consider Zetia. May need to consider evaluation in lipid clinic for PCSK 9 inhibitor.   4. HTN. Will continue to monitor closely at Rehab. If it remains high we could add low dose Coreg or increase losartan.   I will follow up in 3 months.

## 2014-07-26 NOTE — Patient Instructions (Signed)
We will check fasting lab work today and then recommend cholesterol therapy  We will schedule you for an Echocardiogram in one month  Continue to monitor your BP at cardiac Rehab.  I will see you in 3 months.

## 2014-07-27 ENCOUNTER — Encounter (HOSPITAL_COMMUNITY): Payer: Medicare Other

## 2014-07-30 ENCOUNTER — Other Ambulatory Visit: Payer: Self-pay

## 2014-07-30 ENCOUNTER — Encounter (HOSPITAL_COMMUNITY)
Admission: RE | Admit: 2014-07-30 | Discharge: 2014-07-30 | Disposition: A | Discharge status: Home / Self Care | Payer: Medicare Other | Source: Ambulatory Visit | Attending: Cardiology | Admitting: Cardiology

## 2014-07-30 DIAGNOSIS — Z951 Presence of aortocoronary bypass graft: Secondary | ICD-10-CM | POA: Diagnosis not present

## 2014-07-30 DIAGNOSIS — E785 Hyperlipidemia, unspecified: Secondary | ICD-10-CM

## 2014-07-30 DIAGNOSIS — I2510 Atherosclerotic heart disease of native coronary artery without angina pectoris: Secondary | ICD-10-CM | POA: Diagnosis not present

## 2014-08-01 ENCOUNTER — Encounter (HOSPITAL_COMMUNITY)
Admission: RE | Admit: 2014-08-01 | Discharge: 2014-08-01 | Disposition: A | Discharge status: Home / Self Care | Payer: Medicare Other | Source: Ambulatory Visit | Attending: Cardiology | Admitting: Cardiology

## 2014-08-01 DIAGNOSIS — Z951 Presence of aortocoronary bypass graft: Secondary | ICD-10-CM | POA: Diagnosis not present

## 2014-08-01 DIAGNOSIS — I2510 Atherosclerotic heart disease of native coronary artery without angina pectoris: Secondary | ICD-10-CM | POA: Diagnosis not present

## 2014-08-03 ENCOUNTER — Encounter (HOSPITAL_COMMUNITY)
Admission: RE | Admit: 2014-08-03 | Discharge: 2014-08-03 | Disposition: A | Discharge status: Home / Self Care | Payer: Medicare Other | Source: Ambulatory Visit | Attending: Cardiology | Admitting: Cardiology

## 2014-08-03 DIAGNOSIS — Z951 Presence of aortocoronary bypass graft: Secondary | ICD-10-CM | POA: Diagnosis not present

## 2014-08-03 DIAGNOSIS — I2510 Atherosclerotic heart disease of native coronary artery without angina pectoris: Secondary | ICD-10-CM | POA: Diagnosis not present

## 2014-08-06 ENCOUNTER — Other Ambulatory Visit: Payer: Self-pay | Admitting: Surgery

## 2014-08-06 ENCOUNTER — Encounter (HOSPITAL_COMMUNITY)
Admission: RE | Admit: 2014-08-06 | Discharge: 2014-08-06 | Disposition: A | Discharge status: Home / Self Care | Payer: Medicare Other | Source: Ambulatory Visit | Attending: Cardiology | Admitting: Cardiology

## 2014-08-06 DIAGNOSIS — Z951 Presence of aortocoronary bypass graft: Secondary | ICD-10-CM

## 2014-08-06 DIAGNOSIS — I2510 Atherosclerotic heart disease of native coronary artery without angina pectoris: Secondary | ICD-10-CM | POA: Diagnosis not present

## 2014-08-08 ENCOUNTER — Ambulatory Visit (INDEPENDENT_AMBULATORY_CARE_PROVIDER_SITE_OTHER): Payer: Self-pay | Admitting: Surgery

## 2014-08-08 ENCOUNTER — Encounter (HOSPITAL_COMMUNITY): Payer: Medicare Other

## 2014-08-08 ENCOUNTER — Encounter: Payer: Self-pay | Admitting: Surgery

## 2014-08-08 ENCOUNTER — Ambulatory Visit
Admission: RE | Admit: 2014-08-08 | Discharge: 2014-08-08 | Disposition: A | Discharge status: Home / Self Care | Payer: Medicare Other | Source: Ambulatory Visit | Attending: Surgery | Admitting: Surgery

## 2014-08-08 VITALS — BP 159/83 | HR 67 | Resp 16 | Ht 65.5 in | Wt 145.0 lb

## 2014-08-08 DIAGNOSIS — J90 Pleural effusion, not elsewhere classified: Secondary | ICD-10-CM | POA: Diagnosis not present

## 2014-08-08 DIAGNOSIS — Z951 Presence of aortocoronary bypass graft: Secondary | ICD-10-CM | POA: Diagnosis not present

## 2014-08-08 DIAGNOSIS — J9811 Atelectasis: Secondary | ICD-10-CM | POA: Diagnosis not present

## 2014-08-08 DIAGNOSIS — I2510 Atherosclerotic heart disease of native coronary artery without angina pectoris: Secondary | ICD-10-CM

## 2014-08-08 DIAGNOSIS — I509 Heart failure, unspecified: Secondary | ICD-10-CM | POA: Diagnosis not present

## 2014-08-10 ENCOUNTER — Encounter (HOSPITAL_COMMUNITY)
Admission: RE | Admit: 2014-08-10 | Discharge: 2014-08-10 | Disposition: A | Discharge status: Home / Self Care | Payer: Medicare Other | Source: Ambulatory Visit | Attending: Cardiology | Admitting: Cardiology

## 2014-08-10 ENCOUNTER — Encounter: Payer: Self-pay | Admitting: Surgery

## 2014-08-10 DIAGNOSIS — Z951 Presence of aortocoronary bypass graft: Secondary | ICD-10-CM | POA: Diagnosis not present

## 2014-08-10 DIAGNOSIS — I2510 Atherosclerotic heart disease of native coronary artery without angina pectoris: Secondary | ICD-10-CM | POA: Diagnosis not present

## 2014-08-10 NOTE — Progress Notes (Signed)
     HPI:   Patient returns for postoperative follow-up having undergone CABG x 4 on 05/21/2014. She feels well and is walking daily without chest pain or shortness of breath. The only time she gets short of breath is walking up the stairs. Her LFT's returned to normal and she was started on Crestor by Dr. Martinique.  Current Outpatient Prescriptions  Medication Sig Dispense Refill  . acetaminophen (TYLENOL) 500 MG tablet Take 500 mg by mouth every 6 (six) hours as needed.    Marland Kitchen aspirin EC 81 MG tablet Take 81 mg by mouth daily.    Marland Kitchen losartan (COZAAR) 25 MG tablet Take 1 tablet (25 mg total) by mouth daily. 30 tablet 1  . rosuvastatin (CRESTOR) 5 MG tablet Take 1 tablet (5 mg total) by mouth daily. 30 tablet 6   No current facility-administered medications for this visit.   Facility-Administered Medications Ordered in Other Visits  Medication Dose Route Frequency Provider Last Rate Last Dose  . fentaNYL (SUBLIMAZE) injection    Anesthesia Intra-op Kelli Pang, CRNA   100 mcg at 05/01/14 0636  . midazolam (VERSED) 5 MG/5ML injection    Anesthesia Intra-op Kelli Pang, CRNA   2 mg at 05/01/14 0630     Physical Exam: BP 159/83 mmHg  Pulse 67  Resp 16  Ht 5' 5.5" (1.664 m)  Wt 145 lb (65.772 kg)  BMI 23.75 kg/m2  SpO2 98% She looks well Cardiac exam shows a regular rate and rhythm with normal heart sounds. Lung exam reveals clear breath sounds. The chest incision is healing well and the sternum is stable The right leg incisions look good. There is no lower extremity edema.  Diagnostic Tests:  CLINICAL DATA: CABG. Shortness of breath.  EXAM: CHEST 2 VIEW  COMPARISON: None.  FINDINGS: Mediastinum and hilar structures normal. Prior CABG. Cardiomegaly. Mild pulmonary venous congestion. Several interstitial prominence with Kerley B-lines noted. Small left pleural effusion. These findings suggest mild congestive heart failure. Interim clearing of previously  identified left base subsegmental atelectasis. No pneumothorax. Prior cervical spine fusion.  IMPRESSION: 1. Prior CABG. Mild congestive heart failure with mild interstitial edema with subtle Kerley B-lines and small left pleural effusion. 2. Interim clearing of previously identified left base subsegmental atelectasis.   Electronically Signed  By: Kelli Roy  On: 08/08/2014 13:18  Impression:  She looks great. The pleural effusion on the left has resolved. I encouraged her to continue watching her diet and remaining active. She is scheduled for a follow up echo at 3 months postop.  Plan:  She will continue to follow up with Dr. Martinique for her cardiology care.

## 2014-08-13 ENCOUNTER — Encounter (HOSPITAL_COMMUNITY)
Admission: RE | Admit: 2014-08-13 | Discharge: 2014-08-13 | Disposition: A | Discharge status: Home / Self Care | Payer: Medicare Other | Source: Ambulatory Visit | Attending: Cardiology | Admitting: Cardiology

## 2014-08-13 DIAGNOSIS — I2510 Atherosclerotic heart disease of native coronary artery without angina pectoris: Secondary | ICD-10-CM | POA: Diagnosis not present

## 2014-08-13 DIAGNOSIS — Z951 Presence of aortocoronary bypass graft: Secondary | ICD-10-CM | POA: Diagnosis not present

## 2014-08-15 ENCOUNTER — Encounter (HOSPITAL_COMMUNITY)
Admission: RE | Admit: 2014-08-15 | Discharge: 2014-08-15 | Disposition: A | Discharge status: Home / Self Care | Payer: Medicare Other | Source: Ambulatory Visit | Attending: Cardiology | Admitting: Cardiology

## 2014-08-15 DIAGNOSIS — Z951 Presence of aortocoronary bypass graft: Secondary | ICD-10-CM | POA: Diagnosis not present

## 2014-08-15 DIAGNOSIS — I2510 Atherosclerotic heart disease of native coronary artery without angina pectoris: Secondary | ICD-10-CM | POA: Diagnosis not present

## 2014-08-17 ENCOUNTER — Encounter (HOSPITAL_COMMUNITY)
Admission: RE | Admit: 2014-08-17 | Discharge: 2014-08-17 | Disposition: A | Discharge status: Home / Self Care | Payer: Medicare Other | Source: Ambulatory Visit | Attending: Cardiology | Admitting: Cardiology

## 2014-08-17 DIAGNOSIS — I2510 Atherosclerotic heart disease of native coronary artery without angina pectoris: Secondary | ICD-10-CM | POA: Diagnosis not present

## 2014-08-17 DIAGNOSIS — Z951 Presence of aortocoronary bypass graft: Secondary | ICD-10-CM | POA: Diagnosis not present

## 2014-08-20 ENCOUNTER — Encounter (HOSPITAL_COMMUNITY)
Admission: RE | Admit: 2014-08-20 | Discharge: 2014-08-20 | Disposition: A | Discharge status: Home / Self Care | Payer: Medicare Other | Source: Ambulatory Visit | Attending: Cardiology | Admitting: Cardiology

## 2014-08-20 DIAGNOSIS — I2510 Atherosclerotic heart disease of native coronary artery without angina pectoris: Secondary | ICD-10-CM | POA: Diagnosis not present

## 2014-08-20 DIAGNOSIS — Z951 Presence of aortocoronary bypass graft: Secondary | ICD-10-CM | POA: Diagnosis not present

## 2014-08-22 ENCOUNTER — Encounter (HOSPITAL_COMMUNITY)
Admission: RE | Admit: 2014-08-22 | Discharge: 2014-08-22 | Disposition: A | Discharge status: Home / Self Care | Payer: Medicare Other | Source: Ambulatory Visit | Attending: Cardiology | Admitting: Cardiology

## 2014-08-22 DIAGNOSIS — Z951 Presence of aortocoronary bypass graft: Secondary | ICD-10-CM | POA: Diagnosis not present

## 2014-08-22 DIAGNOSIS — I2510 Atherosclerotic heart disease of native coronary artery without angina pectoris: Secondary | ICD-10-CM | POA: Diagnosis not present

## 2014-08-24 ENCOUNTER — Encounter (HOSPITAL_COMMUNITY)
Admission: RE | Admit: 2014-08-24 | Discharge: 2014-08-24 | Disposition: A | Discharge status: Home / Self Care | Payer: Medicare Other | Source: Ambulatory Visit | Attending: Cardiology | Admitting: Cardiology

## 2014-08-24 DIAGNOSIS — Z951 Presence of aortocoronary bypass graft: Secondary | ICD-10-CM | POA: Diagnosis not present

## 2014-08-24 DIAGNOSIS — I2510 Atherosclerotic heart disease of native coronary artery without angina pectoris: Secondary | ICD-10-CM | POA: Diagnosis not present

## 2014-08-27 ENCOUNTER — Encounter (HOSPITAL_COMMUNITY)
Admission: RE | Admit: 2014-08-27 | Discharge: 2014-08-27 | Disposition: A | Discharge status: Home / Self Care | Payer: Medicare Other | Source: Ambulatory Visit | Attending: Cardiology | Admitting: Cardiology

## 2014-08-27 ENCOUNTER — Telehealth: Payer: Self-pay

## 2014-08-27 DIAGNOSIS — Z951 Presence of aortocoronary bypass graft: Secondary | ICD-10-CM | POA: Diagnosis not present

## 2014-08-27 DIAGNOSIS — I2510 Atherosclerotic heart disease of native coronary artery without angina pectoris: Secondary | ICD-10-CM | POA: Diagnosis not present

## 2014-08-27 NOTE — Telephone Encounter (Signed)
Received a call from patient crestor 5 mg samples left at front desk of [facility] office.

## 2014-08-28 NOTE — Progress Notes (Signed)
Cardiac Rehabilitation Program Outcomes Report   Orientation:  06/26/14 Graduate Date:  tbd Discharge Date:  tbd # of sessions completed: 18  Cardiologist: P Martinique Family MD:  Francoise Ceo Time:  1100  A.  Exercise Program:  Tolerates exercise @ 3.30 METS for 15 minutes  B.  Mental Health:  Good mental attitude  C.  Education/Instruction/Skills  Accurately checks own pulse.  Rest:  69  Exercise:  105 and Knows THR for exercise  Uses Perceived Exertion Scale and/or Dyspnea Scale  D.  Nutrition/Weight Control/Body Composition:  Adherence to prescribed nutrition program: good    E.  Blood Lipids    Lab Results  Component Value Date   CHOL 238* 07/26/2014   HDL 56 07/26/2014   LDLCALC 157* 07/26/2014   TRIG 123 07/26/2014   CHOLHDL 4.3 07/26/2014    F.  Lifestyle Changes:  Making positive lifestyle changes  G.  Symptoms noted with exercise:  Asymptomatic  Report Completed By:  Stevphen Rochester RN    Comments:  This is patients halfway progress note. Patient is doing well in program and feels it is helping her.

## 2014-08-29 ENCOUNTER — Encounter (HOSPITAL_COMMUNITY)
Admission: RE | Admit: 2014-08-29 | Discharge: 2014-08-29 | Disposition: A | Discharge status: Home / Self Care | Payer: Medicare Other | Source: Ambulatory Visit | Attending: Cardiology | Admitting: Cardiology

## 2014-08-29 DIAGNOSIS — Z951 Presence of aortocoronary bypass graft: Secondary | ICD-10-CM | POA: Diagnosis not present

## 2014-08-29 DIAGNOSIS — I2510 Atherosclerotic heart disease of native coronary artery without angina pectoris: Secondary | ICD-10-CM | POA: Diagnosis not present

## 2014-08-30 ENCOUNTER — Ambulatory Visit (HOSPITAL_COMMUNITY)
Admission: RE | Admit: 2014-08-30 | Discharge: 2014-08-30 | Disposition: A | Discharge status: Home / Self Care | Payer: Medicare Other | Source: Ambulatory Visit | Attending: Cardiovascular Disease | Admitting: Cardiovascular Disease

## 2014-08-30 DIAGNOSIS — I2510 Atherosclerotic heart disease of native coronary artery without angina pectoris: Secondary | ICD-10-CM | POA: Diagnosis not present

## 2014-08-30 DIAGNOSIS — I255 Ischemic cardiomyopathy: Secondary | ICD-10-CM | POA: Diagnosis not present

## 2014-08-30 DIAGNOSIS — Z951 Presence of aortocoronary bypass graft: Secondary | ICD-10-CM | POA: Insufficient documentation

## 2014-08-30 DIAGNOSIS — E785 Hyperlipidemia, unspecified: Secondary | ICD-10-CM | POA: Diagnosis not present

## 2014-08-30 DIAGNOSIS — I10 Essential (primary) hypertension: Secondary | ICD-10-CM | POA: Diagnosis not present

## 2014-08-30 DIAGNOSIS — I5022 Chronic systolic (congestive) heart failure: Secondary | ICD-10-CM | POA: Diagnosis not present

## 2014-08-30 DIAGNOSIS — I25119 Atherosclerotic heart disease of native coronary artery with unspecified angina pectoris: Secondary | ICD-10-CM | POA: Insufficient documentation

## 2014-08-30 NOTE — Progress Notes (Signed)
2D Echocardiogram Complete.  08/30/2014   Kelli Roy [place], RDCS

## 2014-08-31 ENCOUNTER — Other Ambulatory Visit: Payer: Self-pay

## 2014-08-31 ENCOUNTER — Encounter (HOSPITAL_COMMUNITY)
Admission: RE | Admit: 2014-08-31 | Discharge: 2014-08-31 | Disposition: A | Discharge status: Home / Self Care | Payer: Medicare Other | Source: Ambulatory Visit | Attending: Cardiology | Admitting: Cardiology

## 2014-08-31 DIAGNOSIS — Z951 Presence of aortocoronary bypass graft: Secondary | ICD-10-CM | POA: Diagnosis not present

## 2014-08-31 DIAGNOSIS — I2510 Atherosclerotic heart disease of native coronary artery without angina pectoris: Secondary | ICD-10-CM | POA: Diagnosis not present

## 2014-08-31 MED ORDER — LOSARTAN POTASSIUM 50 MG PO TABS: 50.0000 mg | ORAL_TABLET | Freq: Every day | ORAL | Status: DC

## 2014-08-31 MED ORDER — CARVEDILOL 6.25 MG PO TABS: 6.2500 mg | ORAL_TABLET | Freq: Two times a day (BID) | ORAL | Status: DC

## 2014-09-03 ENCOUNTER — Telehealth: Payer: Self-pay | Admitting: Cardiology

## 2014-09-03 ENCOUNTER — Encounter (HOSPITAL_COMMUNITY)
Admission: RE | Admit: 2014-09-03 | Discharge: 2014-09-03 | Disposition: A | Discharge status: Home / Self Care | Payer: Medicare Other | Source: Ambulatory Visit | Attending: Cardiology | Admitting: Cardiology

## 2014-09-03 DIAGNOSIS — Z951 Presence of aortocoronary bypass graft: Secondary | ICD-10-CM | POA: Diagnosis not present

## 2014-09-03 DIAGNOSIS — I2510 Atherosclerotic heart disease of native coronary artery without angina pectoris: Secondary | ICD-10-CM | POA: Diagnosis not present

## 2014-09-03 NOTE — Telephone Encounter (Signed)
She can reduce Coreg to 3.125 mg bid until I see her back.  Peter Martinique MD, [facility]

## 2014-09-03 NOTE — Telephone Encounter (Signed)
New message      Pt c/o Shortness Of Breath: STAT if SOB developed within the last 24 hours or pt is noticeably SOB on the phone  1. Are you currently SOB (can you hear that pt is SOB on the phone)? no 2. How long have you been experiencing SOB? Last night and today----started carvedilol last friday 3. Are you SOB when sitting or when up moving around? Mainly on exertion---pt went to rehab today and did fine 4. Are you currently experiencing any other symptoms? - no Pt recently started carvedilol----could the sob be from the medication?

## 2014-09-03 NOTE — Telephone Encounter (Signed)
Pt experiencing dyspnea over the weekend  Friday - Started back on carvedilol 6.25mg  BID, dose of losartan increased from 25mg  to 50mg  daily  Pt reports BP today 108/52 & HR 52 at Cardiac Rehab.  Checked pulse at home later, it was 48.  She was scaled back on her activity at rehab but still able to exercise. Concerned due to med changes. Understands I will defer to Dr. Martinique to advise.

## 2014-09-04 NOTE — Addendum Note (Signed)
Addended by: Golden Hurter D on: 09/04/2014 08:38 AM   Modules accepted: Orders, Medications

## 2014-09-04 NOTE — Telephone Encounter (Signed)
Returned call to patient Dr.Jordan advised to decrease coreg to 3.125 mg twice a day.Stated she continues to be sob with exertion this morning.Stated she will decrease coreg and she will call back if sob continues.

## 2014-09-05 ENCOUNTER — Encounter (HOSPITAL_COMMUNITY): Payer: Medicare Other

## 2014-09-07 ENCOUNTER — Encounter (HOSPITAL_COMMUNITY): Payer: Medicare Other

## 2014-09-07 ENCOUNTER — Inpatient Hospital Stay (HOSPITAL_COMMUNITY)
Admission: EM | Admit: 2014-09-07 | Discharge: 2014-09-09 | DRG: 291 | Disposition: A | Discharge status: Home / Self Care | Payer: Medicare Other | Attending: Internal Medicine | Admitting: Internal Medicine

## 2014-09-07 ENCOUNTER — Encounter (HOSPITAL_COMMUNITY): Payer: Self-pay | Admitting: Emergency Medicine

## 2014-09-07 ENCOUNTER — Emergency Department (HOSPITAL_COMMUNITY): Payer: Medicare Other

## 2014-09-07 DIAGNOSIS — Z961 Presence of intraocular lens: Secondary | ICD-10-CM | POA: Diagnosis present

## 2014-09-07 DIAGNOSIS — Z955 Presence of coronary angioplasty implant and graft: Secondary | ICD-10-CM

## 2014-09-07 DIAGNOSIS — Z981 Arthrodesis status: Secondary | ICD-10-CM

## 2014-09-07 DIAGNOSIS — I2510 Atherosclerotic heart disease of native coronary artery without angina pectoris: Secondary | ICD-10-CM | POA: Diagnosis present

## 2014-09-07 DIAGNOSIS — I10 Essential (primary) hypertension: Secondary | ICD-10-CM | POA: Diagnosis present

## 2014-09-07 DIAGNOSIS — G4700 Insomnia, unspecified: Secondary | ICD-10-CM | POA: Diagnosis present

## 2014-09-07 DIAGNOSIS — I255 Ischemic cardiomyopathy: Secondary | ICD-10-CM | POA: Diagnosis present

## 2014-09-07 DIAGNOSIS — R0602 Shortness of breath: Secondary | ICD-10-CM | POA: Diagnosis not present

## 2014-09-07 DIAGNOSIS — J9601 Acute respiratory failure with hypoxia: Secondary | ICD-10-CM | POA: Diagnosis present

## 2014-09-07 DIAGNOSIS — G43909 Migraine, unspecified, not intractable, without status migrainosus: Secondary | ICD-10-CM | POA: Diagnosis present

## 2014-09-07 DIAGNOSIS — Z9841 Cataract extraction status, right eye: Secondary | ICD-10-CM | POA: Diagnosis not present

## 2014-09-07 DIAGNOSIS — R0902 Hypoxemia: Secondary | ICD-10-CM

## 2014-09-07 DIAGNOSIS — E785 Hyperlipidemia, unspecified: Secondary | ICD-10-CM | POA: Diagnosis present

## 2014-09-07 DIAGNOSIS — N179 Acute kidney failure, unspecified: Secondary | ICD-10-CM | POA: Diagnosis present

## 2014-09-07 DIAGNOSIS — J90 Pleural effusion, not elsewhere classified: Secondary | ICD-10-CM | POA: Diagnosis not present

## 2014-09-07 DIAGNOSIS — Z951 Presence of aortocoronary bypass graft: Secondary | ICD-10-CM

## 2014-09-07 DIAGNOSIS — Z7982 Long term (current) use of aspirin: Secondary | ICD-10-CM

## 2014-09-07 DIAGNOSIS — H539 Unspecified visual disturbance: Secondary | ICD-10-CM | POA: Diagnosis not present

## 2014-09-07 DIAGNOSIS — I5043 Acute on chronic combined systolic (congestive) and diastolic (congestive) heart failure: Principal | ICD-10-CM

## 2014-09-07 DIAGNOSIS — Z9842 Cataract extraction status, left eye: Secondary | ICD-10-CM | POA: Diagnosis not present

## 2014-09-07 DIAGNOSIS — Z79899 Other long term (current) drug therapy: Secondary | ICD-10-CM

## 2014-09-07 DIAGNOSIS — I509 Heart failure, unspecified: Secondary | ICD-10-CM

## 2014-09-07 DIAGNOSIS — R0682 Tachypnea, not elsewhere classified: Secondary | ICD-10-CM | POA: Diagnosis not present

## 2014-09-07 DIAGNOSIS — I252 Old myocardial infarction: Secondary | ICD-10-CM

## 2014-09-07 DIAGNOSIS — I501 Left ventricular failure: Secondary | ICD-10-CM | POA: Diagnosis not present

## 2014-09-07 DIAGNOSIS — H538 Other visual disturbances: Secondary | ICD-10-CM

## 2014-09-07 HISTORY — DX: Acute myocardial infarction, unspecified: I21.9

## 2014-09-07 HISTORY — DX: Reserved for inherently not codable concepts without codable children: IMO0001

## 2014-09-07 LAB — CBC WITH DIFFERENTIAL/PLATELET
BASOS PCT: 0 % (ref 0–1)
Basophils Absolute: 0 10*3/uL (ref 0.0–0.1)
EOS PCT: 1 % (ref 0–5)
Eosinophils Absolute: 0.1 10*3/uL (ref 0.0–0.7)
HEMATOCRIT: 44.3 % (ref 36.0–46.0)
Hemoglobin: 14.2 g/dL (ref 12.0–15.0)
LYMPHS ABS: 1.2 10*3/uL (ref 0.7–4.0)
LYMPHS PCT: 10 % — AB (ref 12–46)
MCH: 29.5 pg (ref 26.0–34.0)
MCHC: 32.1 g/dL (ref 30.0–36.0)
MCV: 91.9 fL (ref 78.0–100.0)
Monocytes Absolute: 0.4 10*3/uL (ref 0.1–1.0)
Monocytes Relative: 4 % (ref 3–12)
NEUTROS ABS: 10.4 10*3/uL — AB (ref 1.7–7.7)
Neutrophils Relative %: 85 % — ABNORMAL HIGH (ref 43–77)
Platelets: 196 10*3/uL (ref 150–400)
RBC: 4.82 MIL/uL (ref 3.87–5.11)
RDW: 12.9 % (ref 11.5–15.5)
WBC: 12.1 10*3/uL — AB (ref 4.0–10.5)

## 2014-09-07 LAB — CREATININE, SERUM
Creatinine, Ser: 0.78 mg/dL (ref 0.50–1.10)
GFR calc non Af Amer: 83 mL/min — ABNORMAL LOW (ref >90)

## 2014-09-07 LAB — URINALYSIS, ROUTINE W REFLEX MICROSCOPIC
BILIRUBIN URINE: NEGATIVE
Glucose, UA: NEGATIVE mg/dL
Hgb urine dipstick: NEGATIVE
KETONES UR: NEGATIVE mg/dL
LEUKOCYTES UA: NEGATIVE
Nitrite: NEGATIVE
PH: 5.5 (ref 5.0–8.0)
Protein, ur: NEGATIVE mg/dL
SPECIFIC GRAVITY, URINE: 1.007 (ref 1.005–1.030)
UROBILINOGEN UA: 0.2 mg/dL (ref 0.0–1.0)

## 2014-09-07 LAB — MAGNESIUM
Magnesium: 2.1 mg/dL (ref 1.5–2.5)
Magnesium: 1.9 mg/dL (ref 1.5–2.5)

## 2014-09-07 LAB — I-STAT TROPONIN, ED: Troponin i, poc: 0.01 ng/mL (ref 0.00–0.08)

## 2014-09-07 LAB — BASIC METABOLIC PANEL
Anion gap: 9 (ref 5–15)
BUN: 14 mg/dL (ref 6–23)
CHLORIDE: 107 mmol/L (ref 96–112)
CO2: 21 mmol/L (ref 19–32)
Calcium: 9.3 mg/dL (ref 8.4–10.5)
Creatinine, Ser: 0.96 mg/dL (ref 0.50–1.10)
GFR calc Af Amer: 68 mL/min — ABNORMAL LOW (ref >90)
GFR calc non Af Amer: 59 mL/min — ABNORMAL LOW (ref >90)
Glucose, Bld: 216 mg/dL — ABNORMAL HIGH (ref 70–99)
POTASSIUM: 4.4 mmol/L (ref 3.5–5.1)
SODIUM: 137 mmol/L (ref 135–145)

## 2014-09-07 LAB — CBC
HEMATOCRIT: 44.1 % (ref 36.0–46.0)
Hemoglobin: 14.7 g/dL (ref 12.0–15.0)
MCH: 30 pg (ref 26.0–34.0)
MCHC: 33.3 g/dL (ref 30.0–36.0)
MCV: 90 fL (ref 78.0–100.0)
PLATELETS: 180 10*3/uL (ref 150–400)
RBC: 4.9 MIL/uL (ref 3.87–5.11)
RDW: 12.9 % (ref 11.5–15.5)
WBC: 7.5 10*3/uL (ref 4.0–10.5)

## 2014-09-07 LAB — BRAIN NATRIURETIC PEPTIDE: B Natriuretic Peptide: 1112.8 pg/mL — ABNORMAL HIGH (ref 0.0–100.0)

## 2014-09-07 MED ORDER — ALBUTEROL (5 MG/ML) CONTINUOUS INHALATION SOLN
10.0000 mg/h | INHALATION_SOLUTION | RESPIRATORY_TRACT | Status: DC
  Administered 2014-09-07: 10 mg/h via RESPIRATORY_TRACT
  Filled 2014-09-07: qty 20

## 2014-09-07 MED ORDER — MORPHINE SULFATE 4 MG/ML IJ SOLN: 4.0000 mg | Freq: Once | INTRAMUSCULAR | Status: DC

## 2014-09-07 MED ORDER — LOSARTAN POTASSIUM 25 MG PO TABS
25.0000 mg | ORAL_TABLET | Freq: Every day | ORAL | Status: DC
  Administered 2014-09-07 – 2014-09-09 (×3): 25 mg via ORAL
  Filled 2014-09-07 (×4): qty 1

## 2014-09-07 MED ORDER — ZOLPIDEM TARTRATE 5 MG PO TABS: 5.0000 mg | ORAL_TABLET | Freq: Every evening | ORAL | Status: DC | PRN

## 2014-09-07 MED ORDER — CARVEDILOL 3.125 MG PO TABS
3.1250 mg | ORAL_TABLET | Freq: Two times a day (BID) | ORAL | Status: DC
Start: 2014-09-07 — End: 2014-09-09
  Administered 2014-09-07 – 2014-09-09 (×5): 3.125 mg via ORAL
  Filled 2014-09-07 (×8): qty 1

## 2014-09-07 MED ORDER — ONDANSETRON HCL 4 MG/2ML IJ SOLN: 4.0000 mg | Freq: Four times a day (QID) | INTRAMUSCULAR | Status: DC | PRN

## 2014-09-07 MED ORDER — FUROSEMIDE 10 MG/ML IJ SOLN: 60.0000 mg | Freq: Three times a day (TID) | INTRAMUSCULAR | Status: DC

## 2014-09-07 MED ORDER — HEPARIN SODIUM (PORCINE) 5000 UNIT/ML IJ SOLN
5000.0000 [IU] | Freq: Three times a day (TID) | INTRAMUSCULAR | Status: DC
  Administered 2014-09-07 – 2014-09-09 (×6): 5000 [IU] via SUBCUTANEOUS
  Filled 2014-09-07 (×10): qty 1

## 2014-09-07 MED ORDER — SODIUM CHLORIDE 0.9 % IJ SOLN: 3.0000 mL | INTRAMUSCULAR | Status: DC | PRN

## 2014-09-07 MED ORDER — FUROSEMIDE 10 MG/ML IJ SOLN
40.0000 mg | Freq: Once | INTRAMUSCULAR | Status: AC
  Administered 2014-09-07: 40 mg via INTRAVENOUS
  Filled 2014-09-07: qty 4

## 2014-09-07 MED ORDER — SODIUM CHLORIDE 0.9 % IV SOLN: 250.0000 mL | INTRAVENOUS | Status: DC | PRN

## 2014-09-07 MED ORDER — ROSUVASTATIN CALCIUM 5 MG PO TABS
5.0000 mg | ORAL_TABLET | Freq: Every day | ORAL | Status: DC
  Administered 2014-09-07 – 2014-09-09 (×3): 5 mg via ORAL
  Filled 2014-09-07 (×3): qty 1

## 2014-09-07 MED ORDER — ASPIRIN EC 81 MG PO TBEC
81.0000 mg | DELAYED_RELEASE_TABLET | Freq: Every day | ORAL | Status: DC
  Administered 2014-09-07 – 2014-09-09 (×3): 81 mg via ORAL
  Filled 2014-09-07 (×3): qty 1

## 2014-09-07 MED ORDER — SODIUM CHLORIDE 0.9 % IJ SOLN
3.0000 mL | Freq: Two times a day (BID) | INTRAMUSCULAR | Status: DC
  Administered 2014-09-07 – 2014-09-09 (×5): 3 mL via INTRAVENOUS

## 2014-09-07 MED ORDER — POTASSIUM CHLORIDE CRYS ER 20 MEQ PO TBCR
20.0000 meq | EXTENDED_RELEASE_TABLET | Freq: Three times a day (TID) | ORAL | Status: DC
  Administered 2014-09-07 – 2014-09-09 (×6): 20 meq via ORAL
  Filled 2014-09-07 (×8): qty 1

## 2014-09-07 MED ORDER — ACETAMINOPHEN 500 MG PO TABS
500.0000 mg | ORAL_TABLET | Freq: Four times a day (QID) | ORAL | Status: DC | PRN
  Administered 2014-09-07 (×2): 500 mg via ORAL
  Filled 2014-09-07 (×2): qty 1

## 2014-09-07 MED ORDER — FUROSEMIDE 10 MG/ML IJ SOLN
40.0000 mg | Freq: Two times a day (BID) | INTRAMUSCULAR | Status: DC
  Administered 2014-09-07 – 2014-09-09 (×4): 40 mg via INTRAVENOUS
  Filled 2014-09-07 (×6): qty 4

## 2014-09-07 NOTE — Consult Note (Signed)
CARDIOLOGY CONSULT NOTE   Patient ID: Kelli Roy MRN: [id number], DOB/AGE: 01-05-1946   Admit date: 09/07/2014 Date of Consult: 09/07/2014   Primary Physician: Tommy Medal, MD Primary Cardiologist: Dr. Martinique  Pt. Profile  69 year old female with past medical history of HTN, HLD, CAD s/p anterior MI in September status post CABG 4 in November 2015, and a history of ischemic cardiomyopathy with improved EF of 35-40% came in with HF  Problem List  Past Medical History  Diagnosis Date  . Hypertension   . Hyperlipidemia   . Malaise and fatigue   . Insomnia   . Family history of colon cancer   . Family history of cardiovascular disease   . History of long-term treatment with high-risk medication   . Sinus headache   . Alopecia   . CAD (coronary artery disease)     a. 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 Cyndia Bent   . Ischemic cardiomyopathy     a. 03/2014 Echo: EF 25-30%, mid-apical/anteroseptal and inf AK, Gr 2 DD, ? apical thrombus, Mild MR.-->Discharged with LifeVest.  . Chronic systolic CHF (congestive heart failure)     a. 03/2014 Echo: EF 25-30%; Pre op TEE 05/2014 with EF of 50%    Past Surgical History  Procedure Laterality Date  . Cardiac catheterization  2001    showed no blockages  . Cystectomy  1998    fibroid cysts  . 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 Cervical spine surgery  09/27/2003    C6-67 servical fusion  . Dilation and curettage of uterus  1976    2nd to miscarriage  . Cataract extraction w/phaco Left 01/04/2014    Procedure: CATARACT EXTRACTION PHACO AND INTRAOCULAR LENS PLACEMENT (IOC);  Surgeon: Tonny Branch, MD;  Location: [facility];  Service: Ophthalmology;  Laterality: Left;  CDE:5.65  .  Cataract extraction w/phaco Right 01/29/2014    Procedure: CATARACT EXTRACTION PHACO AND INTRAOCULAR LENS PLACEMENT RIGHT EYE CDE=4.49;  Surgeon: Tonny Branch, MD;  Location: [facility];  Service: Ophthalmology;  Laterality: Right;  . Eye surgery    . Coronary artery bypass graft N/A 05/21/2014    Procedure: CORONARY ARTERY BYPASS GRAFTING (CABG);  Surgeon: Gaye Pollack, MD;  Location: [facility];  Service: Open Heart Surgery;  Laterality: N/A;  . Intraoperative transesophageal echocardiogram N/A 05/21/2014    Procedure: INTRAOPERATIVE TRANSESOPHAGEAL ECHOCARDIOGRAM;  Surgeon: Gaye Pollack, MD;  Location: [facility] OR;  Service: Open Heart Surgery;  Laterality: N/A;  . Left heart catheterization with coronary angiogram N/A 03/08/2014    Procedure: LEFT HEART CATHETERIZATION WITH CORONARY ANGIOGRAM;  Surgeon: Peter M Martinique, MD;  Location: [facility] CATH LAB;  Service: Cardiovascular;  Laterality: N/A;  . Percutaneous coronary stent intervention (pci-s)  03/08/2014    Procedure: PERCUTANEOUS CORONARY STENT INTERVENTION (PCI-S);  Surgeon: Peter M Martinique, MD;  Location: [facility] CATH LAB;  Service: Cardiovascular;;  prox lad  bms     Allergies  Allergies  Allergen Reactions  . Demerol [Meperidine] Nausea Only  . Lipitor [Atorvastatin] Itching  . Lisinopril Cough  . Meperidine Hcl Nausea Only  . Plavix [Clopidogrel Bisulfate]     Rash   . Dexilant [Dexlansoprazole] Rash  . Latex  Rash  . Sulfonamide Derivatives Rash  . Ticagrelor Itching and Rash    HPI   The patient is a pleasant 69 year old female with past medical history of HTN, HLD, CAD s/p anterior MI in September status post CABG 4 in November 2015, and a history of ischemic cardiomyopathy with improved EF of 35-40%. She had anterior STEMI and underwent emergent BMS stenting of the proximal LAD occlusion in September 2015. He had a significant residual disease in left main, and LCx disease. Her initial EF was 25-30%. She was placed on high-dose Lipitor however  this was discontinued later due to marked elevation of her transaminases. In November she returned for CABG by Dr. Cyndia Bent with LIMA to LAD, SVG to diagonal, SVG to OM, and SVG to RCA. TEE showed improvement of the EF to 50%. She has been allergic to both Brilinta and Plavix with rash, she is now on aspirin alone. Her last echocardiogram obtained on 08/30/2014 showed EF 35-40%, akinesis of apical, apical anterior myocardium, mid anteroseptal myocardium, apical lateral myocardium, grade 3 diastolic dysfunction.  Patient has been going to cardiac rehabilitation in January and February and has been doing very well. She denies significant exertional angina symptom. Last week, patient was placed on 6.25 mg BID of carvedilol. This dose was decreased on 2/29 as patient has been started to have shortness of breath. In the past week, patient also complained of nasal congestion. For the past several days, her shortness of breath worsened. This morning, patient woke up around 4:30 AM with significant shortness breath. On arrival to [facility] ED, her blood pressure was 154/64. O2 saturation 99% on nonrebreather. Her BNP was elevated at 1112. Significant laboratory finding include WBC 12.1, creatinine 0.96. Chest x-ray showed congestive heart failure with interstitial and alveolar edema. EKG showed normal sinus rhythm with bigeminy, poor R wave progression in anterior leads.   Inpatient Medications  . aspirin EC  81 mg Oral Daily  . carvedilol  3.125 mg Oral BID  . furosemide  40 mg Intravenous Q12H  . heparin  5,000 Units Subcutaneous 3 times per day  . losartan  25 mg Oral Daily  . potassium chloride  20 mEq Oral TID  . rosuvastatin  5 mg Oral Daily  . sodium chloride  3 mL Intravenous Q12H    Family History Family History  Problem Relation Age of Onset  . Stroke Mother   . Heart attack Mother   . Peripheral vascular disease Father     had stent in leg  . Colon cancer Father   . Atrial fibrillation  Father     with pacemaker  . Thyroid disease Father   . Pancreatic cancer Brother   . Coronary artery disease Brother     with Stent  . Coronary artery disease Brother     with CABG in his 33's  . Thyroid disease Brother   . Thyroid disease Sister      Social History History   Social History  . Marital Status: Married    Spouse Name: N/A  . Number of Children: N/A  . Years of Education: N/A   Occupational History  . Not on file.   Social History Main Topics  . Smoking status: Never Smoker   . Smokeless tobacco: Never Used  . Alcohol Use: Yes     Comment: rare glass of wine  . Drug Use: No  . Sexual Activity: Yes    Birth Control/ Protection: Post-menopausal   Other Topics Concern  .  Not on file   Social History Narrative     Review of Systems  General:  No chills, fever, night sweats or weight changes.  Cardiovascular:  No chest pain, edema. +dyspnea on exertion, orthopnea, paroxysmal nocturnal dyspnea. Dermatological: No rash, lesions/masses Respiratory: No cough +dyspnea Urologic: No hematuria, dysuria Abdominal:   No nausea, vomiting, diarrhea, bright red blood per rectum, melena, or hematemesis Neurologic:  No visual changes, wkns, changes in mental status. All other systems reviewed and are otherwise negative except as noted above.  Physical Exam  Blood pressure 140/64, pulse 64, temperature 97.7 F (36.5 C), resp. rate 26, height 5\' 5"  (1.651 m), weight 143 lb 1.3 oz (64.9 kg), SpO2 97 %.  General: Pleasant, NAD Psych: Normal affect. Neuro: Alert and oriented X 3. Moves all extremities spontaneously. HEENT: Normal  Neck: Supple without bruits  +JVD. Lungs:  Resp regular and unlabored. Diminished breath sound in bibasilar region, worse on the L base Heart: RRR no s3, s4, or murmurs. Abdomen: Soft, non-tender, non-distended, BS + x 4.  Extremities: No clubbing, cyanosis or edema. DP/PT/Radials 2+ and equal bilaterally.  Labs  No results for  input(s): CKTOTAL, CKMB, TROPONINI in the last 72 hours. Lab Results  Component Value Date   WBC 12.1* 09/07/2014   HGB 14.2 09/07/2014   HCT 44.3 09/07/2014   MCV 91.9 09/07/2014   PLT 196 09/07/2014    Recent Labs Lab 09/07/14 0435  NA 137  K 4.4  CL 107  CO2 21  BUN 14  CREATININE 0.96  CALCIUM 9.3  GLUCOSE 216*   Lab Results  Component Value Date   CHOL 238* 07/26/2014   HDL 56 07/26/2014   LDLCALC 157* 07/26/2014   TRIG 123 07/26/2014   No results found for: DDIMER  Radiology/Studies  Dg Chest Port 1 View  09/07/2014   CLINICAL DATA:  Hypoxia. Recent CABG. Symptoms worsening over the last few days. Shortness of breath.  EXAM: PORTABLE CHEST - 1 VIEW  COMPARISON:  08/28/2014  FINDINGS: Previous median sternotomy and CABG. Artifact overlies the chest. Cardiac silhouette is mildly enlarged. There is venous hypertension with interstitial and alveolar pulmonary edema. Bilateral pleural effusions. Volume loss in the lower lobes.  IMPRESSION: Previous CABG. Congestive heart failure with interstitial and alveolar edema, effusions and lower lobe volume loss.   Electronically Signed   By: Nelson Chimes M.D.   On: 09/07/2014 09:53    ECG  EKG showed normal sinus rhythm with bigeminy, poor R wave progression in anterior leads.   ASSESSMENT AND PLAN  1. Acute on chronic combined systolic and diastolic heart failure  - agree with addition of IV lasix. Continue coreg and losartan, doubt it is coreg that's causing her SOB, likely timing overlapped with her fluid overload, will uptitrate as BP allows once her HR resolve  - consider increase losartan to 50mg  daily. Will need PO lasix on discharge  2. CAD s/p CABG 4 in November 2015  - s/p anterior STEMI s/p BMS to ostial LAD  - s/p 4v CABG in Nov 2015 LIMA to LAD, SVG to diagonal, SVG to OM, and SVG to RCA  - no sign of recent angina  3. Ischemic cardiomyopathy with improved EF to 35-40%  4. Hypertension  5. Hyperlipidemia:  significant elevated transaminase on high dose lipitor  - currently on low dose Crestor.   Hilbert Corrigan, PA-C 09/07/2014, 2:54 PM  Patient seen and examined with Almyra Deforest, PA-C. We discussed all aspects of the encounter.  I agree with the assessment and plan as stated above.  She has very mild HF flare which may be related to addition of carvedilol. Recent EF 35-40%. Agree with 2-3 doses of IV lasix. She should be ready for d/c tomorrow or Sunday. Would consider using lasix 20 and spiro 12.5 daily at d/c with carvedilol 3.125 bid. Would keep losartan at 50 daily for now. No evidence of low output.   Benay Spice 3:33 PM

## 2014-09-07 NOTE — ED Notes (Signed)
Respiratory tech notified to start continuous neb for patient.

## 2014-09-07 NOTE — ED Notes (Signed)
Portable xray at bedside.

## 2014-09-07 NOTE — ED Provider Notes (Signed)
TIME SEEN: 9:10 AM  CHIEF COMPLAINT: Shortness of breath  HPI: Pt is a 69 y.o. female with history of hypertension, hyperlipidemia, prior anterior STEMI in September 2015 requiring CABG, ischemic cardiomyopathy with EF 35-40% on 08/30/14 and who presents the emergency department shortness of breath this is been intermittent for the past week worse today. Patient does not wear oxygen at home but has a new oxygen requirement. Has had diaphoresis and intermittent palpitations. Since she is having nasal congestion, dry cough and wheezing. No history of tobacco use, asthma or COPD. Denies any fever. No lower extremity swelling or pain. Denies chest pain or chest discomfort. No prior history of PE or DVT.  ROS: See HPI Constitutional: no fever  Eyes: no drainage  ENT: no runny nose   Cardiovascular:  no chest pain  Resp:  SOB  GI: no vomiting GU: no dysuria Integumentary: no rash  Allergy: no hives  Musculoskeletal: no leg swelling  Neurological: no slurred speech ROS otherwise negative  PAST MEDICAL HISTORY/PAST SURGICAL HISTORY:  Past Medical History  Diagnosis Date  . Hypertension   . Hyperlipidemia   . Malaise and fatigue   . Insomnia   . Family history of colon cancer   . Family history of cardiovascular disease   . History of long-term treatment with high-risk medication   . Sinus headache   . Alopecia   . CAD (coronary artery disease)     a. 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 Cyndia Bent   . Ischemic cardiomyopathy     a. 03/2014 Echo: EF 25-30%, mid-apical/anteroseptal and inf AK, Gr 2 DD, ? apical thrombus, Mild MR.-->Discharged with LifeVest.  . Chronic systolic CHF (congestive heart failure)     a. 03/2014 Echo: EF 25-30%; Pre op TEE 05/2014 with EF of 50%     MEDICATIONS:  Prior to Admission medications   Medication Sig Start Date End Date Taking? Authorizing Provider  acetaminophen (TYLENOL) 500 MG tablet Take 500 mg by mouth every 6 (six) hours as needed.    Historical Provider, MD  aspirin EC 81 MG tablet Take 81 mg by mouth daily.    Historical Provider, MD  carvedilol (COREG) 3.125 MG tablet Take 1 tablet (3.125 mg total) by mouth 2 (two) times daily. 09/04/14   Peter M Martinique, MD  losartan (COZAAR) 50 MG tablet Take 1 tablet (50 mg total) by mouth daily. 08/31/14   Peter M Martinique, MD  rosuvastatin (CRESTOR) 5 MG tablet Take 1 tablet (5 mg total) by mouth daily. 07/30/14   Peter M Martinique, MD    ALLERGIES:  Allergies  Allergen Reactions  . Demerol [Meperidine] Nausea Only  . Lipitor [Atorvastatin] Itching  . Lisinopril Cough  . Meperidine Hcl Nausea Only  . Plavix [Clopidogrel Bisulfate]     Rash   . Dexilant [Dexlansoprazole] Rash  . Latex Rash  . Sulfonamide Derivatives Rash    SOCIAL HISTORY:  History  Substance Use Topics  . Smoking status: Never Smoker   . Smokeless tobacco: Never Used  . Alcohol Use: Yes     Comment: rare glass of wine    FAMILY HISTORY: Family History  Problem Relation Age of Onset  . Stroke Mother   . Heart attack Mother   .  Peripheral vascular disease Father     had stent in leg  . Colon cancer Father   . Atrial fibrillation Father     with pacemaker  . Thyroid disease Father   . Pancreatic cancer Brother   . Coronary artery disease Brother     with Stent  . Coronary artery disease Brother     with CABG in his 24's  . Thyroid disease Brother   . Thyroid disease Sister     EXAM: BP 154/64 mmHg  Pulse 67  Resp 26  Ht 5\' 5"  (1.651 m)  Wt 144 lb (65.318 kg)  BMI 23.96 kg/m2  SpO2 99% CONSTITUTIONAL: Alert and oriented and responds appropriately to questions. Well-appearing; well-nourished, speaking short sentences, in mild respiratory distress HEAD: Normocephalic EYES:  Conjunctivae clear, PERRL ENT: normal nose; no rhinorrhea; moist mucous membranes; pharynx without lesions noted NECK: Supple, no meningismus, no LAD  CARD: RRR; S1 and S2 appreciated; no murmurs, no clicks, no rubs, no gallops RESP: Normal chest excursion without splinting or tachypnea; breath sounds diminished at her bases bilaterally with expiratory wheezing, bibasilar Rales, no rhonchi, patient has a new oxygen requirement and oxygen saturation is 86% on room air at rest ABD/GI: Normal bowel sounds; non-distended; soft, non-tender, no rebound, no guarding BACK:  The back appears normal and is non-tender to palpation, there is no CVA tenderness EXT: Normal ROM in all joints; non-tender to palpation; no edema; normal capillary refill; no cyanosis, no calf tenderness    SKIN: Normal color for age and race; warm NEURO: Moves all extremities equally PSYCH: The patient's mood and manner are appropriate. Grooming and personal hygiene are appropriate.  MEDICAL DECISION MAKING: Patient here with hypoxia, shortness of breath. Differential diagnosis includes pneumonia, CHF exacerbation, pulmonary embolus. We'll obtain cardiac labs, chest x-ray. We'll give continuous albuterol treatment. No prior history of reactive airway disease or tobacco use. EKG shows bigeminy.  ED PROGRESS: Labs show mild leukocytosis of 12.1 with left shift. Troponin negative. BNP is elevated greater than 1000. Chest x-ray shows pulmonary edema. She has received Lasix 40 mg IV. We'll discuss with medicine for admission. PCP is Dr. Tommy Medal with [facility]. Cardiologist is Dr. Peter Martinique.  10:55 AM  D/w Ebony Hail, NP with hospitalist service for admission. Discussed with patient and family. Will admit to telemetry, inpatient bed. I will place holding orders.     EKG Interpretation  Date/Time:  Friday September 07 2014 08:50:15 EST Ventricular Rate:  84 PR Interval:  141 QRS Duration: 79 QT  Interval:  383 QTC Calculation: 453 R Axis:   106 Text Interpretation:  Sinus rhythm Ventricular bigeminy Right axis deviation Borderline low voltage, extremity leads Anteroseptal infarct, old Nonspecific T abnormalities, lateral leads Confirmed by Leone,  DO, KRISTEN [phone]) on 09/07/2014 9:32:48 AM        Delice Bison Loadholt, DO 09/07/14 1057

## 2014-09-07 NOTE — ED Notes (Signed)
Dietary lunch tray ordered for patient.

## 2014-09-07 NOTE — ED Notes (Signed)
EMS - Patient coming from home with c/o of SOB and diaphoretic ongoing since Tuesday.  Change in medication - Coreg (new add on since last Friday).  Bigeminal PVCs present on EKG.  Denies chest pain.  Patient on NRB for SOB, 90% Room air, 98% on NRB.

## 2014-09-07 NOTE — H&P (Signed)
[facility] History and Physical                                                                                    Kelli Roy, is a 69 y.o. female  MRN: [id number]   DOB - 1946/03/25  Admit Date - 09/07/2014  Outpatient Primary MD for the patient is Tommy Medal, MD  With History of -  Past Medical History  Diagnosis Date  . Hypertension   . Hyperlipidemia   . Malaise and fatigue   . Insomnia   . Family history of colon cancer   . Family history of cardiovascular disease   . History of long-term treatment with high-risk medication   . Sinus headache   . Alopecia   . CAD (coronary artery disease)     a. 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 Cyndia Bent   . Ischemic cardiomyopathy     a. 03/2014 Echo: EF 25-30%, mid-apical/anteroseptal and inf AK, Gr 2 DD, ? apical thrombus, Mild MR.-->Discharged with LifeVest.  . Chronic systolic CHF (congestive heart failure)     a. 03/2014 Echo: EF 25-30%; Pre op TEE 05/2014 with EF of 50%      Past Surgical History  Procedure Laterality Date  . Cardiac catheterization  2001    showed no blockages  . Cystectomy  1998    fibroid cysts  . 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 Cervical spine surgery  09/27/2003    C6-67 servical fusion  . Dilation and curettage of uterus  1976    2nd to miscarriage  . Cataract extraction w/phaco Left 01/04/2014    Procedure: CATARACT EXTRACTION PHACO AND INTRAOCULAR LENS PLACEMENT (IOC);  Surgeon: Tonny Branch, MD;  Location: [facility];  Service: Ophthalmology;  Laterality: Left;  CDE:5.65  . Cataract extraction w/phaco Right 01/29/2014    Procedure: CATARACT EXTRACTION PHACO AND INTRAOCULAR LENS PLACEMENT RIGHT EYE CDE=4.49;  Surgeon: Tonny Branch, MD;   Location: [facility];  Service: Ophthalmology;  Laterality: Right;  . Eye surgery    . Coronary artery bypass graft N/A 05/21/2014    Procedure: CORONARY ARTERY BYPASS GRAFTING (CABG);  Surgeon: Gaye Pollack, MD;  Location: [place];  Service: Open Heart Surgery;  Laterality: N/A;  . Intraoperative transesophageal echocardiogram N/A 05/21/2014    Procedure: INTRAOPERATIVE TRANSESOPHAGEAL ECHOCARDIOGRAM;  Surgeon: Gaye Pollack, MD;  Location: [facility] OR;  Service: Open Heart Surgery;  Laterality: N/A;  . Left heart catheterization with coronary angiogram N/A 03/08/2014    Procedure: LEFT HEART CATHETERIZATION WITH CORONARY ANGIOGRAM;  Surgeon: Peter M Martinique, MD;  Location: [facility] CATH LAB;  Service: Cardiovascular;  Laterality: N/A;  . Percutaneous coronary stent intervention (pci-s)  03/08/2014    Procedure: PERCUTANEOUS CORONARY STENT INTERVENTION (PCI-S);  Surgeon: Peter M Martinique, MD;  Location: [facility] CATH LAB;  Service: Cardiovascular;;  prox lad  bms  in for   Chief Complaint  Patient presents with  . Shortness of Breath     HPI Kelli Roy  is a 69 y.o. female, 68 year old female patient with known hypertension, dyslipidemia and prior STEMI September 2015 that required CABG procedure. Because of this she has underlying ischemic cardiomyopathy with combined systolic and diastolic heart failure. Most recent echocardiogram was over 25th 2016 which revealed EF 35-40% and grade 2 diastolic dysfunction. She presented to the emergency department today because of progressive shortness of breath and dyspnea on exertion. She initially reported nasal congestion with what she describes as a "rattle" in the nasal area without shortness of breath onset 1 week prior. Over the past 3-4 days she has noticed increasing dyspnea on exertion in the past 24 hours she reports a 2 pound weight gain. She is unclear as to exactly what her dry weight is supposed to be. She reports she has not noticed any lower extremity edema.  In  review of her outpatient records on 2/26 patient's cardiologist resumed her carvedilol dose at 6.25 twice a day and increased her losartan to 50 mg daily. She called the office on 2/29 reporting dyspneic symptoms over the previous weekend. She reported that her blood pressure was 108/52 with a heart rate of 52 at cardiac rehabilitation and later when she repeated her pulse at home it was 48. At that time she was advised to scale back on her activity at rehabilitation but continue to exercise otherwise. She had questions for her cardiologist regarding her recent medications. Dr. Martinique advised to reduce the carvedilol back to 3.125 mg twice daily until he was able to see her again at the office. The office returned a call to the patient on 3/1 with that advice. She reported she continued to be short of breath with exertion and was instructed to call back if her shortness of breath continued.  In the ER she was mildly hypertensive with a BP of 152/81. She was to Initially with a respiratory rate of 26, her pulse was 66, her room air oxygen saturations were 80% and she was placed on nasal cannula oxygen initially required a nonrebreather mask and after the administration of Lasix and nebulizer treatments she was tapered down to nasal cannula oxygen. Because she had wheezing on exam she was given a nebulizer treatment. Her chest x-ray revealed congestive heart failure with interstitial and alveolar edema as well as effusions and lower lobe volume loss.  Review of Systems   In addition to the HPI above,  No Fever-chills, myalgias or other constitutional symptoms only nasal "rattling". No Headache, changes with Vision or hearing, new weakness, tingling, numbness in any extremity, No problems swallowing food or Liquids, indigestion/reflux No Chest pain or palpitations No Abdominal pain, N/V; no melena or hematochezia, no dark tarry stools, Bowel movements are regular, No dysuria, hematuria or flank pain No new  skin rashes, lesions, masses or bruises, No new joints pains-aches No recent weight loss although she does report a 2 pound weight gain in the past 24 hours No polyuria, polydypsia or polyphagia,  *A full 10 point Review of Systems was done, except as stated above, all other Review of Systems were negative.  Social History History  Substance Use Topics  . Smoking status: Never Smoker   . Smokeless tobacco: Never Used  . Alcohol Use: Yes     Comment: rare glass of wine    Family History Family History  Problem Relation Age of Onset  . Stroke  Mother   . Heart attack Mother   . Peripheral vascular disease Father     had stent in leg  . Colon cancer Father   . Atrial fibrillation Father     with pacemaker  . Thyroid disease Father   . Pancreatic cancer Brother   . Coronary artery disease Brother     with Stent  . Coronary artery disease Brother     with CABG in his 79's  . Thyroid disease Brother   . Thyroid disease Sister     Prior to Admission medications   Medication Sig Start Date End Date Taking? Authorizing Provider  acetaminophen (TYLENOL) 500 MG tablet Take 500 mg by mouth every 6 (six) hours as needed.   Yes Historical Provider, MD  aspirin EC 81 MG tablet Take 81 mg by mouth daily.   Yes Historical Provider, MD  carvedilol (COREG) 6.25 MG tablet Take 3.125 mg by mouth 2 (two) times daily. 08/31/14  Yes Historical Provider, MD  eszopiclone (LUNESTA) 1 MG TABS tablet Take 1 mg by mouth daily. 09/04/14  Yes Historical Provider, MD  losartan (COZAAR) 50 MG tablet Take 1 tablet (50 mg total) by mouth daily. 08/31/14  Yes Peter M Martinique, MD  rosuvastatin (CRESTOR) 5 MG tablet Take 1 tablet (5 mg total) by mouth daily. 07/30/14  Yes Peter M Martinique, MD    Allergies  Allergen Reactions  . Demerol [Meperidine] Nausea Only  . Lipitor [Atorvastatin] Itching  . Lisinopril Cough  . Meperidine Hcl Nausea Only  . Plavix [Clopidogrel Bisulfate]     Rash   . Dexilant  [Dexlansoprazole] Rash  . Latex Rash  . Sulfonamide Derivatives Rash  . Ticagrelor Itching and Rash    Physical Exam  Vitals  Blood pressure 168/92, pulse 68, temperature 97.7 F (36.5 C), resp. rate 17, height 5\' 5"  (1.651 m), weight 144 lb (65.318 kg), SpO2 99 %.   General:  In no acute distress, appears healthy and well nourished, she seated upright 90 in the stretcher in the ER  Psych:  Normal affect, Denies Suicidal or Homicidal ideations, Awake Alert, Oriented X 3. Speech and thought patterns are clear and appropriate, no apparent short term memory deficits  Neuro:   No focal neurological deficits, CN II through XII intact, Strength 5/5 all 4 extremities, Sensation intact all 4 extremities.  ENT:  Ears and Eyes appear Normal, Conjunctivae clear, PER. Moist oral mucosa without erythema or exudates.  Neck:  Supple, No lymphadenopathy appreciated  Respiratory:  Symmetrical chest wall movement, Good air movement bilaterally, bilateral crackles throughout all lung fields with the occasional expiratory wheeze, somewhat diminished mid field and base, 4 liters nasal cannula oxygen, she currently is not tachypneic and her respiratory effort is nonlabored. She is able to be in complete sentences without evidence of dyspnea  Cardiac:  RRR, No Murmurs, + S4 gallop, no LE edema noted, 4-5 cm JVD, No carotid bruits, peripheral pulses palpable at 2+  Abdomen:  Positive bowel sounds, Soft, Non tender, Non distended,  No masses appreciated, no obvious hepatosplenomegaly  Skin:  No Cyanosis, Normal Skin Turgor, No Skin Rash or Bruise.  Extremities: Symmetrical without obvious trauma or injury,  no effusions.  Data Review  CBC  Recent Labs Lab 09/07/14 0435  WBC 12.1*  HGB 14.2  HCT 44.3  PLT 196  MCV 91.9  MCH 29.5  MCHC 32.1  RDW 12.9  LYMPHSABS 1.2  MONOABS 0.4  EOSABS 0.1  BASOSABS 0.0    Chemistries  Recent Labs Lab 09/07/14 0435  NA 137  K 4.4  CL 107  CO2 21   GLUCOSE 216*  BUN 14  CREATININE 0.96  CALCIUM 9.3    estimated creatinine clearance is 49.8 mL/min (by C-G formula based on Cr of 0.96).  No results for input(s): TSH, T4TOTAL, T3FREE, THYROIDAB in the last 72 hours.  Invalid input(s): FREET3  Coagulation profile No results for input(s): INR, PROTIME in the last 168 hours.  No results for input(s): DDIMER in the last 72 hours.  Cardiac Enzymes No results for input(s): CKMB, TROPONINI, MYOGLOBIN in the last 168 hours.  Invalid input(s): CK  Invalid input(s): POCBNP  Urinalysis    Component Value Date/Time   COLORURINE AMBER* 05/17/2014 1250   APPEARANCEUR CLEAR 05/17/2014 1250   LABSPEC 1.020 05/17/2014 1250   PHURINE 5.0 05/17/2014 1250   GLUCOSEU NEGATIVE 05/17/2014 1250   HGBUR NEGATIVE 05/17/2014 1250   BILIRUBINUR NEGATIVE 05/17/2014 1250   KETONESUR NEGATIVE 05/17/2014 1250   PROTEINUR NEGATIVE 05/17/2014 1250   UROBILINOGEN 0.2 05/17/2014 1250   NITRITE NEGATIVE 05/17/2014 1250   LEUKOCYTESUR NEGATIVE 05/17/2014 1250    Imaging results:   Dg Chest 2 View  08/08/2014   CLINICAL DATA:  CABG.  Shortness of breath.  EXAM: CHEST  2 VIEW  COMPARISON:  None.  FINDINGS: Mediastinum and hilar structures normal. Prior CABG. Cardiomegaly. Mild pulmonary venous congestion. Several interstitial prominence with Kerley B-lines noted. Small left pleural effusion. These findings suggest mild congestive heart failure. Interim clearing of previously identified left base subsegmental atelectasis. No pneumothorax. Prior cervical spine fusion.  IMPRESSION: 1. Prior CABG. Mild congestive heart failure with mild interstitial edema with subtle Kerley B-lines and small left pleural effusion. 2. Interim clearing of previously identified left base subsegmental atelectasis.   Electronically Signed   By: Marcello Moores  Register   On: 08/08/2014 13:18   Dg Chest Port 1 View  09/07/2014   CLINICAL DATA:  Hypoxia. Recent CABG. Symptoms worsening over  the last few days. Shortness of breath.  EXAM: PORTABLE CHEST - 1 VIEW  COMPARISON:  08/28/2014  FINDINGS: Previous median sternotomy and CABG. Artifact overlies the chest. Cardiac silhouette is mildly enlarged. There is venous hypertension with interstitial and alveolar pulmonary edema. Bilateral pleural effusions. Volume loss in the lower lobes.  IMPRESSION: Previous CABG. Congestive heart failure with interstitial and alveolar edema, effusions and lower lobe volume loss.   Electronically Signed   By: Nelson Chimes M.D.   On: 09/07/2014 09:53     EKG: Sinus rhythm with PVC bigeminy, QTC 453 ms   Assessment & Plan  Active Problems:   Acute respiratory failure with hypoxia /Acute on chronic combined systolic (EF 81-44%) and diastolic heart failure (NYHA 2) -Admit to telemetry unit -Begin aggressive IV diuresis with Lasix 60 mg IV every 6 hours; insert Foley catheter for accurate intake and output; patient also quite dyspneic when getting on and off bedpan -Provide potassium replacement 3 times a day while on Lasix IV -Daily weights, heart failure clinical pathway -Continue carvedilol but use lower dose 3.125 mg twice a day -Continue losartan but also use lower dose at 25 mg daily -No indication to repeat echocardiogram since was just completed 08/30/14 -Consider consulting patient's primary cardiologist Dr. Martinique -No indications of dietary indiscretion -Follow electrolyte panel    Acute renal failure -Baseline renal function BUN 9 and creatinine 0.7 -Current renal function BUN 14 creatinine 0.96 which has significantly reduced GFR -Suspect low perfusion from acute heart failure  as culprit; patient also with issues related to relative bradycardia and lower blood pressures which may have also contributed therefore rationale for keeping carvedilol and losartan doses lower while pushing for aggressive diuresis   PVC bigeminy -Potassium normal -Check magnesium level and replete if indicated     Essential hypertension -Current blood pressure controlled with the above medical regimen    Hyperlipidemia -Continue Crestor    S/P CABG x 4 (Sept 2015) -Currently without any ischemic symptoms    DVT Prophylaxis: Subcutaneous heparin  Family Communication:   Husband at bedside  Code Status:  Full code  Condition:  Stable  Time spent in minutes : 60   Kelli Roy L. ANP on 09/07/2014 at 11:57 AM  Between 7am to 7pm - Pager - [phone]  After 7pm go to [email or web address] - password [email or web address]  And look for the night coverage person covering me after hours  [facility]

## 2014-09-08 ENCOUNTER — Inpatient Hospital Stay (HOSPITAL_COMMUNITY): Payer: Medicare Other

## 2014-09-08 LAB — BASIC METABOLIC PANEL
Anion gap: 10 (ref 5–15)
BUN: 15 mg/dL (ref 6–23)
CO2: 25 mmol/L (ref 19–32)
CREATININE: 0.85 mg/dL (ref 0.50–1.10)
Calcium: 9.3 mg/dL (ref 8.4–10.5)
Chloride: 101 mmol/L (ref 96–112)
GFR calc non Af Amer: 68 mL/min — ABNORMAL LOW (ref >90)
GFR, EST AFRICAN AMERICAN: 79 mL/min — AB (ref >90)
Glucose, Bld: 93 mg/dL (ref 70–99)
Potassium: 3.7 mmol/L (ref 3.5–5.1)
SODIUM: 136 mmol/L (ref 135–145)

## 2014-09-08 NOTE — Progress Notes (Signed)
[facility] PROGRESS NOTE  Kelli Roy GTX:[id number] DOB: [birth year] DOA: 09/07/2014 PCP: Tommy Medal, MD  Assessment/Plan: 1-Acute on chronic Systolic/ diastolic  HF exacerbation Continue with IV lasix. Plan to discharge tomorrow if stable.   Acute hypoxic respiratory failure secondary to acute combined systolic and diastolic heart failure Improved. She has not required oxygen today.   Essential hypertension -Current blood pressure controlled with the above medical regimen   Hyperlipidemia -Continue Crestor -repeat lipid panel.    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  Family Communication: discussed with patient and husband.  Disposition Plan: home 3-6   Consultants:  Cardiology  Procedures:  none  Antibiotics:  none  HPI/Subjective: She is breathing better.   Objective: Filed Vitals:   09/08/14 0500  BP: 114/77  Pulse: 48  Temp: 98.1 F (36.7 C)  Resp: 18    Intake/Output Summary (Last 24 hours) at 09/08/14 1338 Last data filed at 09/08/14 0808  Gross per 24 hour  Intake    720 ml  Output   2500 ml  Net  -1780 ml   Filed Weights   09/07/14 0856 09/07/14 1404 09/08/14 0500  Weight: 65.318 kg (144 lb) 64.9 kg (143 lb 1.3 oz) 63.3 kg (139 lb 8.8 oz)    Exam:   General:  NAD.   Cardiovascular: S 1, S 2 RRR  Respiratory: CTA  Abdomen: BS present, soft, ND  Musculoskeletal: no edema.    Data Reviewed: Basic Metabolic Panel:  Recent Labs Lab 09/07/14 0435 09/07/14 1502 09/08/14 0332  NA 137  --  136  K 4.4  --  3.7  CL 107  --  101  CO2 21  --  25  GLUCOSE 216*  --  93  BUN 14  --  15  CREATININE 0.96 0.78 0.85  CALCIUM 9.3  --  9.3  MG 2.1 1.9  --    Liver Function Tests: No results for input(s): AST, ALT, ALKPHOS, BILITOT, PROT, ALBUMIN in the last 168 hours. No results for input(s): LIPASE, AMYLASE in the last 168 hours. No results for input(s): AMMONIA in  the last 168 hours. CBC:  Recent Labs Lab 09/07/14 0435 09/07/14 1502  WBC 12.1* 7.5  NEUTROABS 10.4*  --   HGB 14.2 14.7  HCT 44.3 44.1  MCV 91.9 90.0  PLT 196 180   Cardiac Enzymes: No results for input(s): CKTOTAL, CKMB, CKMBINDEX, TROPONINI in the last 168 hours. BNP (last 3 results)  Recent Labs  09/07/14 0923  BNP 1112.8*    ProBNP (last 3 results)  Recent Labs  03/07/14 2057  PROBNP 112.8    CBG: No results for input(s): GLUCAP in the last 168 hours.  No results found for this or any previous visit (from the past 240 hour(s)).   Studies: Dg Chest 2 View  09/08/2014   CLINICAL DATA:  CHF exacerbation.  Shortness of breath.  EXAM: CHEST  2 VIEW  COMPARISON:  09/07/2014  FINDINGS: Multiple monitoring leads overlie the patient. Status post median sternotomy. Stable enlarged cardiac and mediastinal contours. Bilateral mid and lower lung heterogeneous pulmonary opacities. Small bilateral pleural effusions. No definite pneumothorax.  IMPRESSION: Bilateral pleural effusions and underlying opacities suggestive of atelectasis and or edema.   Electronically Signed   By: Lovey Newcomer M.D.   On: 09/08/2014 08:02   Dg Chest Port 1 View  09/07/2014   CLINICAL DATA:  Hypoxia. Recent CABG. Symptoms worsening over the last  few days. Shortness of breath.  EXAM: PORTABLE CHEST - 1 VIEW  COMPARISON:  08/28/2014  FINDINGS: Previous median sternotomy and CABG. Artifact overlies the chest. Cardiac silhouette is mildly enlarged. There is venous hypertension with interstitial and alveolar pulmonary edema. Bilateral pleural effusions. Volume loss in the lower lobes.  IMPRESSION: Previous CABG. Congestive heart failure with interstitial and alveolar edema, effusions and lower lobe volume loss.   Electronically Signed   By: Nelson Chimes M.D.   On: 09/07/2014 09:53    Scheduled Meds: . aspirin EC  81 mg Oral Daily  . carvedilol  3.125 mg Oral BID  . furosemide  40 mg Intravenous Q12H  .  heparin  5,000 Units Subcutaneous 3 times per day  . losartan  25 mg Oral Daily  . potassium chloride  20 mEq Oral TID  . rosuvastatin  5 mg Oral Daily  . sodium chloride  3 mL Intravenous Q12H   Continuous Infusions: . 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 If 7PM-7AM, please contact night-coverage at [email or web address], password [facility] 09/08/2014, 1:38 PM  LOS: 1 day

## 2014-09-08 NOTE — Evaluation (Addendum)
Physical Therapy Evaluation and Discharge Patient Details Name: Kelli Roy MRN: [id number] DOB: 10-23-1945 Today's Date: 09/08/2014   History of Present Illness  69 year old female with past medical history of HTN, HLD, CAD s/p anterior MI in September status post CABG 4 in November 2015, and a history of ischemic cardiomyopathy with improved EF of 35-40% came in with HF  Clinical Impression  Patient evaluated by Physical Therapy with no further acute PT needs identified. All education has been completed and the patient has no further questions. Ambulates up to 300 feet today on room air with SpO2 93-97%, HR into 80s (50s at rest.) Safely completed stair training and feels she is near her baseline with mobility tasks. See below for any follow-up Physial Therapy or equipment needs. PT is signing off. Thank you for this referral.     Follow Up Recommendations No PT follow up;Other (comment) (MD decide if pt should consider cardiac rehab at d/c)    Equipment Recommendations  None recommended by PT    Recommendations for Other Services       Precautions / Restrictions Precautions Precautions: None Restrictions Weight Bearing Restrictions: No      Mobility  Bed Mobility Overal bed mobility: Modified Independent             General bed mobility comments: Extra time  Transfers Overall transfer level: Modified independent                  Ambulation/Gait Ambulation/Gait assistance: Supervision Ambulation Distance (Feet): 300 Feet Assistive device: None Gait Pattern/deviations: Step-through pattern;Decreased stride length Gait velocity: decreased Gait velocity interpretation: Below normal speed for age/gender General Gait Details: Slow and guarded gait speed. No loss of balance noted. HR into 80s, SpO2 93-97% on room air..  Stairs Stairs: Yes Stairs assistance: Supervision Stair Management: One rail Right;Forwards;Alternating pattern Number of Stairs:  4 General stair comments: Educated on safe stair navigation. Cues for sequencing. No physical assist needed. States she feels mildy "winded" after task but confident.  Wheelchair Mobility    Modified Rankin (Stroke Patients Only)       Balance Overall balance assessment: Needs assistance Sitting-balance support: No upper extremity supported;Feet supported Sitting balance-Leahy Scale: Normal     Standing balance support: No upper extremity supported Standing balance-Leahy Scale: Good                               Pertinent Vitals/Pain Pain Assessment: No/denies pain    Home Living Family/patient expects to be discharged to:: Private residence Living Arrangements: Spouse/significant other Available Help at Discharge: Family;Available 24 hours/day Type of Home: House Home Access: Stairs to enter Entrance Stairs-Rails: Psychiatric nurse of Steps: 3 Home Layout: Two level;Laundry or work area in [place]: Kasandra Knudsen - single point      Prior Function Level of Independence: Independent               Hand Dominance   Dominant Hand: Right    Extremity/Trunk Assessment   Upper Extremity Assessment: Defer to OT evaluation           Lower Extremity Assessment: Overall WFL for tasks assessed         Communication   Communication: No difficulties  Cognition Arousal/Alertness: Awake/alert Behavior During Therapy: WFL for tasks assessed/performed Overall Cognitive Status: Within Functional Limits for tasks assessed  General Comments General comments (skin integrity, edema, etc.): SpO2 93% and greater on room air    Exercises        Assessment/Plan    PT Assessment Patent does not need any further PT services  PT Diagnosis Abnormality of gait   PT Problem List    PT Treatment Interventions     PT Goals (Current goals can be found in the Care Plan section) Acute Rehab PT Goals Patient  Stated Goal: go home PT Goal Formulation: All assessment and education complete, DC therapy    Frequency     Barriers to discharge        Co-evaluation               End of Session   Activity Tolerance: Patient tolerated treatment well Patient left: in bed;with call bell/phone within reach;with family/visitor present Nurse Communication: Mobility status         Time: 4680-3212 PT Time Calculation (min) (ACUTE ONLY): 16 min   Charges:   PT Evaluation $Initial PT Evaluation Tier I: 1 Procedure     PT G CodesEllouise Newer 09/08/2014, 11:36 AM Elayne Snare, [place]

## 2014-09-08 NOTE — Progress Notes (Signed)
    Subjective:  Denies CP; dyspnea improving   Objective:  Filed Vitals:   09/07/14 2111 09/07/14 2131 09/08/14 0500 09/08/14 0915  BP: 108/56 130/80 114/77   Pulse: 52  48   Temp: 98 F (36.7 C)  98.1 F (36.7 C)   TempSrc: Oral  Oral   Resp: 20  18   Height:      Weight:   139 lb 8.8 oz (63.3 kg)   SpO2: 97%  93% 94%    Intake/Output from previous day:  Intake/Output Summary (Last 24 hours) at 09/08/14 1200 Last data filed at 09/08/14 0600  Gross per 24 hour  Intake    600 ml  Output   3000 ml  Net  -2400 ml    Physical Exam: Physical exam: Well-developed well-nourished in no acute distress.  Skin is warm and dry.  HEENT is normal.  Neck is supple.  Chest diminished BS bases Cardiovascular exam is regular rate and rhythm.  Abdominal exam nontender or distended. No masses palpated. Extremities show no edema. neuro grossly intact    Lab Results: Basic Metabolic Panel:  Recent Labs  09/07/14 0435 09/07/14 1502 09/08/14 0332  NA 137  --  136  K 4.4  --  3.7  CL 107  --  101  CO2 21  --  25  GLUCOSE 216*  --  93  BUN 14  --  15  CREATININE 0.96 0.78 0.85  CALCIUM 9.3  --  9.3  MG 2.1 1.9  --    CBC:  Recent Labs  09/07/14 0435 09/07/14 1502  WBC 12.1* 7.5  NEUTROABS 10.4*  --   HGB 14.2 14.7  HCT 44.3 44.1  MCV 91.9 90.0  PLT 196 180     Assessment/Plan:  1 acute on chronic combined systolic and diastolic congestive heart failure-symptoms are improving. Continue present dose of Lasix today. Follow renal function. Probable discharge tomorrow morning if dyspnea resolved. We'll most likely discharge on Lasix 20 mg daily and spironolactone 12.5 mg daily. 2 ischemic cardiomyopathy-continue present dose of carvedilol and ARB. 3 coronary artery disease-continue aspirin and statin. 4 hypertension-continue present medications.  Kirk Ruths 09/08/2014, 12:00 PM

## 2014-09-09 DIAGNOSIS — I10 Essential (primary) hypertension: Secondary | ICD-10-CM

## 2014-09-09 DIAGNOSIS — H539 Unspecified visual disturbance: Secondary | ICD-10-CM

## 2014-09-09 LAB — LIPID PANEL
CHOL/HDL RATIO: 2.9 ratio
Cholesterol: 160 mg/dL (ref 0–200)
HDL: 56 mg/dL (ref >39)
LDL Cholesterol: 85 mg/dL (ref 0–99)
Triglycerides: 96 mg/dL (ref <150)
VLDL: 19 mg/dL (ref 0–40)

## 2014-09-09 LAB — HEPATIC FUNCTION PANEL
ALBUMIN: 3.6 g/dL (ref 3.5–5.2)
ALK PHOS: 114 U/L (ref 39–117)
ALT: 18 U/L (ref 0–35)
AST: 19 U/L (ref 0–37)
BILIRUBIN TOTAL: 1.1 mg/dL (ref 0.3–1.2)
Bilirubin, Direct: 0.2 mg/dL (ref 0.0–0.5)
Indirect Bilirubin: 0.9 mg/dL (ref 0.3–0.9)
Total Protein: 6.7 g/dL (ref 6.0–8.3)

## 2014-09-09 LAB — BASIC METABOLIC PANEL
ANION GAP: 7 (ref 5–15)
BUN: 18 mg/dL (ref 6–23)
CO2: 30 mmol/L (ref 19–32)
CREATININE: 0.93 mg/dL (ref 0.50–1.10)
Calcium: 10 mg/dL (ref 8.4–10.5)
Chloride: 101 mmol/L (ref 96–112)
GFR, EST AFRICAN AMERICAN: 71 mL/min — AB (ref >90)
GFR, EST NON AFRICAN AMERICAN: 61 mL/min — AB (ref >90)
GLUCOSE: 99 mg/dL (ref 70–99)
POTASSIUM: 4.4 mmol/L (ref 3.5–5.1)
Sodium: 138 mmol/L (ref 135–145)

## 2014-09-09 MED ORDER — SPIRONOLACTONE 25 MG PO TABS: 12.5000 mg | ORAL_TABLET | Freq: Every day | ORAL | Status: DC

## 2014-09-09 MED ORDER — FUROSEMIDE 20 MG PO TABS: 20.0000 mg | ORAL_TABLET | Freq: Every day | ORAL | Status: DC

## 2014-09-09 MED ORDER — ROSUVASTATIN CALCIUM 10 MG PO TABS: 10.0000 mg | ORAL_TABLET | Freq: Every day | ORAL | Status: DC

## 2014-09-09 MED ORDER — SPIRONOLACTONE 12.5 MG HALF TABLET
12.5000 mg | ORAL_TABLET | Freq: Every day | ORAL | Status: DC
  Administered 2014-09-09: 12.5 mg via ORAL
  Filled 2014-09-09: qty 1

## 2014-09-09 MED ORDER — FUROSEMIDE 20 MG PO TABS
20.0000 mg | ORAL_TABLET | Freq: Every day | ORAL | Status: DC
  Filled 2014-09-09: qty 1

## 2014-09-09 MED ORDER — LOSARTAN POTASSIUM 25 MG PO TABS: 25.0000 mg | ORAL_TABLET | Freq: Every day | ORAL | Status: DC

## 2014-09-09 NOTE — Discharge Summary (Signed)
Physician Discharge Summary  Kelli Roy VVZ:[id number] DOB: 1946/06/26 DOA: 09/07/2014  PCP: Tommy Medal, MD  Admit date: 09/07/2014 Discharge date: 09/09/2014  Time spent: 35 minutes  Recommendations for Outpatient Follow-up:  Needs repeat Lipid panel in 3 weeks.  Needs B-met to follow renal function and electrolytes.   Discharge Diagnoses:    Essential hypertension   Acute on chronic combined systolic (EF 54-49%) and diastolic heart failure (NYHA 2)   Hyperlipidemia   S/P CABG x 4   Acute respiratory failure with hypoxia   Acute renal failure   CHF exacerbation   Discharge Condition: stable.   Diet recommendation: heart healthy  Filed Weights   09/07/14 1404 09/08/14 0500 09/09/14 0517  Weight: 64.9 kg (143 lb 1.3 oz) 63.3 kg (139 lb 8.8 oz) 62.1 kg (136 lb 14.5 oz)    History of present illness:  Kelli Roy is a 69 y.o. female, 69 year old female patient with known hypertension, dyslipidemia and prior STEMI September 2015 that required CABG procedure. Because of this she has underlying ischemic cardiomyopathy with combined systolic and diastolic heart failure. Most recent echocardiogram was over 25th 2016 which revealed EF 35-40% and grade 2 diastolic dysfunction. She presented to the emergency department today because of progressive shortness of breath and dyspnea on exertion. She initially reported nasal congestion with what she describes as a "rattle" in the nasal area without shortness of breath onset 1 week prior. Over the past 3-4 days she has noticed increasing dyspnea on exertion in the past 24 hours she reports a 2 pound weight gain. She is unclear as to exactly what her dry weight is supposed to be. She reports she has not noticed any lower extremity edema.  In review of her outpatient records on 2/26 patient's cardiologist resumed her carvedilol dose at 6.25 twice a day and increased her losartan to 50 mg daily. She called the office on 2/29 reporting dyspneic symptoms  over the previous weekend. She reported that her blood pressure was 108/52 with a heart rate of 52 at cardiac rehabilitation and later when she repeated her pulse at home it was 48. At that time she was advised to scale back on her activity at rehabilitation but continue to exercise otherwise. She had questions for her cardiologist regarding her recent medications. Dr. Martin[place] advised to reduce the carvedilol back to 3.125 mg twice daily until he was able to see her again at the office. The office returned a call to the patient on 3/1 with that advice. She reported she continued to be short of breath with exertion and was instructed to call back if her shortness of breath continued.  Hospital Course:  1-Acute on chronic Systolic/ diastolic HF exacerbation Received IV lasix today.  change lasix to 20 mg daily.  Spironolactone , cozaar.  Need B-met to follow electrolytes.   Acute hypoxic respiratory failure secondary to acute combined systolic and diastolic heart failure Improved. She has not required oxygen.   Vision changes, blurry: resolved.  Evaluated by neurology. Unclear if transient TIA. Vs complex migraine. Patient with history of similar episode related to complex migraine. Patient on aspirin, crestor increase dose for goal less than 70.  Patient would like to hold on getting MRI.  Patient was advised to get medical attention if episodes reoccurs.   Essential hypertension -Current blood pressure controlled with the above medical regimen  Hyperlipidemia -increase  Crestor. LDL goal less than 70 -repeat lipid panel. LDL improved at 88 on statin.   S/P CABG x  4 (Sept 2015) -Currently without any ischemic symptoms  Procedures:  none  Consultations:  Cardiology  neurology  Discharge Exam: Filed Vitals:   09/09/14 0517  BP: 132/52  Pulse: 51  Temp: 98.3 F (36.8 C)  Resp: 18    General: Alert in no distress.  Cardiovascular: S 1, S 2 RRR Respiratory: CTA Neuro;  non focal.   Discharge Instructions   Discharge Instructions    Diet - low sodium heart healthy    Complete by:  As directed      Increase activity slowly    Complete by:  As directed           Current Discharge Medication List    START taking these medications   Details  furosemide (LASIX) 20 MG tablet Take 1 tablet (20 mg total) by mouth daily. Qty: 30 tablet, Refills: 0    spironolactone (ALDACTONE) 25 MG tablet Take 0.5 tablets (12.5 mg total) by mouth daily. Qty: 30 tablet, Refills: 0      CONTINUE these medications which have CHANGED   Details  losartan (COZAAR) 25 MG tablet Take 1 tablet (25 mg total) by mouth daily. Qty: 30 tablet, Refills: 0    !! rosuvastatin (CRESTOR) 10 MG tablet Take 1 tablet (10 mg total) by mouth daily. Qty: 30 tablet, Refills: 0     !! - Potential duplicate medications found. Please discuss with provider.    CONTINUE these medications which have NOT CHANGED   Details  acetaminophen (TYLENOL) 500 MG tablet Take 500 mg by mouth every 6 (six) hours as needed.    aspirin EC 81 MG tablet Take 81 mg by mouth daily.    carvedilol (COREG) 6.25 MG tablet Take 3.125 mg by mouth 2 (two) times daily. Refills: 6    eszopiclone (LUNESTA) 1 MG TABS tablet Take 1 mg by mouth daily.    !! rosuvastatin (CRESTOR) 5 MG tablet Take 1 tablet (5 mg total) by mouth daily. Qty: 30 tablet, Refills: 6     !! - Potential duplicate medications found. Please discuss with provider.     Allergies  Allergen Reactions  . Demerol [Meperidine] Nausea Only  . Lipitor [Atorvastatin] Itching  . Lisinopril Cough  . Meperidine Hcl Nausea Only  . Plavix [Clopidogrel Bisulfate]     Rash   . Dexilant [Dexlansoprazole] Rash  . Latex Rash  . Sulfonamide Derivatives Rash  . Ticagrelor Itching and Rash      The results of significant diagnostics from this hospitalization (including imaging, microbiology, ancillary and laboratory) are listed below for reference.     Significant Diagnostic Studies: Dg Chest 2 View  09/08/2014   CLINICAL DATA:  CHF exacerbation.  Shortness of breath.  EXAM: CHEST  2 VIEW  COMPARISON:  09/07/2014  FINDINGS: Multiple monitoring leads overlie the patient. Status post median sternotomy. Stable enlarged cardiac and mediastinal contours. Bilateral mid and lower lung heterogeneous pulmonary opacities. Small bilateral pleural effusions. No definite pneumothorax.  IMPRESSION: Bilateral pleural effusions and underlying opacities suggestive of atelectasis and or edema.   Electronically Signed   By: Lovey Newcomer M.D.   On: 09/08/2014 08:02   Dg Chest Port 1 View  09/07/2014   CLINICAL DATA:  Hypoxia. Recent CABG. Symptoms worsening over the last few days. Shortness of breath.  EXAM: PORTABLE CHEST - 1 VIEW  COMPARISON:  08/28/2014  FINDINGS: Previous median sternotomy and CABG. Artifact overlies the chest. Cardiac silhouette is mildly enlarged. There is venous hypertension with interstitial and alveolar pulmonary  edema. Bilateral pleural effusions. Volume loss in the lower lobes.  IMPRESSION: Previous CABG. Congestive heart failure with interstitial and alveolar edema, effusions and lower lobe volume loss.   Electronically Signed   By: Nelson Chimes M.D.   On: 09/07/2014 09:53    Microbiology: No results found for this or any previous visit (from the past 240 hour(s)).   Labs: Basic Metabolic Panel:  Recent Labs Lab 09/07/14 0435 09/07/14 1502 09/08/14 0332 09/09/14 0425  NA 137  --  136 138  K 4.4  --  3.7 4.4  CL 107  --  101 101  CO2 21  --  25 30  GLUCOSE 216*  --  93 99  BUN 14  --  15 18  CREATININE 0.96 0.78 0.85 0.93  CALCIUM 9.3  --  9.3 10.0  MG 2.1 1.9  --   --    Liver Function Tests:  Recent Labs Lab 09/09/14 0425  AST 19  ALT 18  ALKPHOS 114  BILITOT 1.1  PROT 6.7  ALBUMIN 3.6   No results for input(s): LIPASE, AMYLASE in the last 168 hours. No results for input(s): AMMONIA in the last 168  hours. CBC:  Recent Labs Lab 09/07/14 0435 09/07/14 1502  WBC 12.1* 7.5  NEUTROABS 10.4*  --   HGB 14.2 14.7  HCT 44.3 44.1  MCV 91.9 90.0  PLT 196 180   Cardiac Enzymes: No results for input(s): CKTOTAL, CKMB, CKMBINDEX, TROPONINI in the last 168 hours. BNP: BNP (last 3 results)  Recent Labs  09/07/14 0923  BNP 1112.8*    ProBNP (last 3 results)  Recent Labs  03/07/14 2057  PROBNP 112.8    CBG: No results for input(s): GLUCAP in the last 168 hours.     SignedNiel Hummer A  [facility] 09/09/2014, 1:10 PM

## 2014-09-09 NOTE — Progress Notes (Addendum)
[facility] PROGRESS NOTE  Kelli Roy BHA:[id number] DOB: 08-Dec-1945 DOA: 09/07/2014 PCP: Tommy Medal, MD  Assessment/Plan: 1-Acute on chronic Systolic/ diastolic  HF exacerbation Received IV lasix today.  Will change lasix to 20 mg daily.  Spironolactone , cozaar.  Need B-met to follow electrolytes.   Acute hypoxic respiratory failure secondary to acute combined systolic and diastolic heart failure Improved. She has not required oxygen.   Vision changes, blurry:  Will ask neuro to evaluate.  Frequent neuro check.  Patient would like to hold on getting MRI, await for neurology evaluation.   Essential hypertension -Current blood pressure controlled with the above medical regimen  Hyperlipidemia -Continue Crestor -repeat lipid panel. LDL improved at 88 on statin.   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  Family Communication: discussed with patient.  Disposition Plan: to be determine.    Consultants:  Cardiology  Procedures:  none  Antibiotics:  none  HPI/Subjective: Denies dyspnea.  She had a transient episode of blurry vision, she was not able to read well from left eye. Words were missing. No headaches.  She had prior episodes bu at that time she had headaches. She is able to read now.   Objective: Filed Vitals:   09/09/14 0517  BP: 132/52  Pulse: 51  Temp: 98.3 F (36.8 C)  Resp: 18    Intake/Output Summary (Last 24 hours) at 09/09/14 0903 Last data filed at 09/09/14 0517  Gross per 24 hour  Intake    240 ml  Output   1200 ml  Net   -960 ml   Filed Weights   09/07/14 1404 09/08/14 0500 09/09/14 0517  Weight: 64.9 kg (143 lb 1.3 oz) 63.3 kg (139 lb 8.8 oz) 62.1 kg (136 lb 14.5 oz)    Exam:   General:  NAD.   Cardiovascular: S 1, S 2 RRR  Respiratory: CTA  Abdomen: BS present, soft, ND  Musculoskeletal: no edema.    Data Reviewed: Basic Metabolic Panel:  Recent  Labs Lab 09/07/14 0435 09/07/14 1502 09/08/14 0332 09/09/14 0425  NA 137  --  136 138  K 4.4  --  3.7 4.4  CL 107  --  101 101  CO2 21  --  25 30  GLUCOSE 216*  --  93 99  BUN 14  --  15 18  CREATININE 0.96 0.78 0.85 0.93  CALCIUM 9.3  --  9.3 10.0  MG 2.1 1.9  --   --    Liver Function Tests:  Recent Labs Lab 09/09/14 0425  AST 19  ALT 18  ALKPHOS 114  BILITOT 1.1  PROT 6.7  ALBUMIN 3.6   No results for input(s): LIPASE, AMYLASE in the last 168 hours. No results for input(s): AMMONIA in the last 168 hours. CBC:  Recent Labs Lab 09/07/14 0435 09/07/14 1502  WBC 12.1* 7.5  NEUTROABS 10.4*  --   HGB 14.2 14.7  HCT 44.3 44.1  MCV 91.9 90.0  PLT 196 180   Cardiac Enzymes: No results for input(s): CKTOTAL, CKMB, CKMBINDEX, TROPONINI in the last 168 hours. BNP (last 3 results)  Recent Labs  09/07/14 0923  BNP 1112.8*    ProBNP (last 3 results)  Recent Labs  03/07/14 2057  PROBNP 112.8    CBG: No results for input(s): GLUCAP in the last 168 hours.  No results found for this or any previous visit (from the past 240 hour(s)).   Studies: Dg Chest  2 View  09/08/2014   CLINICAL DATA:  CHF exacerbation.  Shortness of breath.  EXAM: CHEST  2 VIEW  COMPARISON:  09/07/2014  FINDINGS: Multiple monitoring leads overlie the patient. Status post median sternotomy. Stable enlarged cardiac and mediastinal contours. Bilateral mid and lower lung heterogeneous pulmonary opacities. Small bilateral pleural effusions. No definite pneumothorax.  IMPRESSION: Bilateral pleural effusions and underlying opacities suggestive of atelectasis and or edema.   Electronically Signed   By: Lovey Newcomer M.D.   On: 09/08/2014 08:02   Dg Chest Port 1 View  09/07/2014   CLINICAL DATA:  Hypoxia. Recent CABG. Symptoms worsening over the last few days. Shortness of breath.  EXAM: PORTABLE CHEST - 1 VIEW  COMPARISON:  08/28/2014  FINDINGS: Previous median sternotomy and CABG. Artifact overlies the  chest. Cardiac silhouette is mildly enlarged. There is venous hypertension with interstitial and alveolar pulmonary edema. Bilateral pleural effusions. Volume loss in the lower lobes.  IMPRESSION: Previous CABG. Congestive heart failure with interstitial and alveolar edema, effusions and lower lobe volume loss.   Electronically Signed   By: Nelson Chimes M.D.   On: 09/07/2014 09:53    Scheduled Meds: . aspirin EC  81 mg Oral Daily  . carvedilol  3.125 mg Oral BID  . [START ON 09/10/2014] furosemide  20 mg Oral Daily  . heparin  5,000 Units Subcutaneous 3 times per day  . losartan  25 mg Oral Daily  . potassium chloride  20 mEq Oral TID  . rosuvastatin  5 mg Oral Daily  . sodium chloride  3 mL Intravenous Q12H  . spironolactone  12.5 mg Oral Daily   Continuous Infusions: . albuterol Stopped (09/07/14 1056)    Active Problems:   Essential hypertension   Acute on chronic combined systolic (EF 24-38%) and diastolic heart failure (NYHA 2)   Hyperlipidemia   S/P CABG x 4   Acute respiratory failure with hypoxia   Acute renal failure   CHF exacerbation    Time spent: [place], [place] [facility] Pager [phone]. If 7PM-7AM, please contact night-coverage at [email or web address], password [email or web address] 09/09/2014, 9:03 AM  LOS: 2 days

## 2014-09-09 NOTE — Progress Notes (Signed)
    Subjective:  Denies CP; dyspnea resolved   Objective:  Filed Vitals:   09/08/14 0500 09/08/14 0915 09/08/14 2045 09/09/14 0517  BP: 114/77  110/59 132/52  Pulse: 48  57 51  Temp: 98.1 F (36.7 C)  98.4 F (36.9 C) 98.3 F (36.8 C)  TempSrc: Oral  Oral Oral  Resp: 18  18 18   Height:      Weight: 139 lb 8.8 oz (63.3 kg)   136 lb 14.5 oz (62.1 kg)  SpO2: 93% 94% 90% 96%    Intake/Output from previous day:  Intake/Output Summary (Last 24 hours) at 09/09/14 1594 Last data filed at 09/09/14 0517  Gross per 24 hour  Intake    240 ml  Output   1200 ml  Net   -960 ml    Physical Exam: Physical exam: Well-developed well-nourished in no acute distress.  Skin is warm and dry.  HEENT is normal.  Neck is supple.  Chest CTA Cardiovascular exam is regular rate and rhythm.  Abdominal exam nontender or distended. No masses palpated. Extremities show no edema. neuro grossly intact    Lab Results: Basic Metabolic Panel:  Recent Labs  09/07/14 0435 09/07/14 1502 09/08/14 0332 09/09/14 0425  NA 137  --  136 138  K 4.4  --  3.7 4.4  CL 107  --  101 101  CO2 21  --  25 30  GLUCOSE 216*  --  93 99  BUN 14  --  15 18  CREATININE 0.96 0.78 0.85 0.93  CALCIUM 9.3  --  9.3 10.0  MG 2.1 1.9  --   --    CBC:  Recent Labs  09/07/14 0435 09/07/14 1502  WBC 12.1* 7.5  NEUTROABS 10.4*  --   HGB 14.2 14.7  HCT 44.3 44.1  MCV 91.9 90.0  PLT 196 180     Assessment/Plan:  1 acute on chronic combined systolic and diastolic congestive heart failure-symptoms resolved. Discharge on Lasix 20 mg daily and spironolactone 12.5 mg daily. Check BMET one week; FU Dr Martinique or extender 2 weeks. 2 ischemic cardiomyopathy-continue present dose of carvedilol and ARB. 3 coronary artery disease-continue aspirin and statin. 4 hypertension-continue present medications.  Kirk Ruths 09/09/2014, 8:28 AM

## 2014-09-09 NOTE — Consult Note (Signed)
Neurology Consultation Reason for Consult: vision change Referring Physician: Tyrell Antonio, B  CC: Vision change  History is obtained from: Patient  HPI: Kelli Roy is a 69 y.o. female with a history of previous episodes of vision change as well as coronary artery disease who presented with heart failure and was rated be discharged today when she had a vision change. She states that the lights in the blinds been bothering her and she looked out at them and when she looked back and tried to read something she found that she was unable to. She felt like she didn't see parts of the words. She has had 2 identical spells in the past over the past few years at least one of which was associated with severe headache. She was diagnosed with a typical migraine.  She states that this episode was identical to the previous episodes. She does not have a headache with this current episode. She denies any tingling, numbness, weakness.    ROS: A 14 point ROS was performed and is negative except as noted in the HPI.   Past Medical History  Diagnosis Date  . Hypertension   . Hyperlipidemia   . Malaise and fatigue   . Insomnia   . Family history of colon cancer   . Family history of cardiovascular disease   . History of long-term treatment with high-risk medication   . Sinus headache   . Alopecia   . CAD (coronary artery disease)     a. 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 Cyndia Bent   . Ischemic cardiomyopathy     a. 03/2014 Echo: EF 25-30%, mid-apical/anteroseptal and inf AK, Gr 2 DD, ? apical thrombus, Mild MR.-->Discharged with LifeVest.  . Chronic systolic CHF (congestive heart failure)     a. 03/2014 Echo: EF 25-30%; Pre op TEE 05/2014 with EF of 50%  . Myocardial infarction 03/2014  .  Shortness of breath dyspnea     Family History: No history of migraine  Social History: Tob: Denies  Exam: Current vital signs: BP 132/52 mmHg  Pulse 51  Temp(Src) 98.3 F (36.8 C) (Oral)  Resp 18  Ht 5\' 5"  (1.651 m)  Wt 62.1 kg (136 lb 14.5 oz)  BMI 22.78 kg/m2  SpO2 96% Vital signs in last 24 hours: Temp:  [98.3 F (36.8 C)-98.4 F (36.9 C)] 98.3 F (36.8 C) (03/06 0517) Pulse Rate:  [51-57] 51 (03/06 0517) Resp:  [18] 18 (03/06 0517) BP: (110-132)/(52-59) 132/52 mmHg (03/06 0517) SpO2:  [90 %-96 %] 96 % (03/06 0517) Weight:  [62.1 kg (136 lb 14.5 oz)] 62.1 kg (136 lb 14.5 oz) (03/06 0517)  Physical Exam  Constitutional: Appears well-developed and well-nourished.  Psych: Affect appropriate to situation Eyes: No scleral injection HENT: No OP obstrucion Head: Normocephalic.  Cardiovascular: Normal rate and regular rhythm.  Respiratory: Effort normal and breath sounds normal to anterior ascultation GI: Soft.  No distension. There is no tenderness.  Skin: WDI  Neuro: Mental Status: Patient is awake, alert, oriented to person, place, month, year, and situation. Patient is able to give a clear and coherent history. No signs of aphasia or neglect Cranial Nerves: II: Visual Fields are full. Pupils are equal, round, and reactive to light.   III,IV,  VI: EOMI without ptosis or diploplia.  V: Facial sensation is symmetric to temperature VII: Facial movement is symmetric.  VIII: hearing is intact to voice X: Uvula elevates symmetrically XI: Shoulder shrug is symmetric. XII: tongue is midline without atrophy or fasciculations.  Motor: Tone is normal. Bulk is normal. 5/5 strength was present in all four extremities.  Sensory: Sensation is symmetric to light touch and temperature in the arms and legs. Deep Tendon Reflexes: 2+ and symmetric in the biceps and patellae.  Plantars: Toes are downgoing bilaterally.  Cerebellar: FNF intact bilaterally   I have reviewed  labs in epic and the results pertinent to this consultation are: Recent LDL of 85 Recent echo with some hypokinesis, however recent ejection fraction.   Impression: 69 year old female with an episode of transient visual obscuration. Given that she tried to read and was unable to, I suspect that it was bilateral. The similarity to her previous events would argue for a possible migraine aura, however is difficult to definitively exclude TIA.  She has already had lipids checked, echocardiogram, telemetry and is on anti-platelet therapy. Given the posterior circulation location of the symptoms, I do not think carotid Dopplers would be potentially helpful. Given this, I do not think that further evaluation is needed at this time, however if she were to have further symptoms and this could be addressed.  Recommendations: 1) continue control of secondary risk factors   Roland Rack, MD [facility] [phone]  If 7pm- 7am, please page neurology on call as listed in [place].

## 2014-09-10 ENCOUNTER — Telehealth: Payer: Self-pay | Admitting: Cardiology

## 2014-09-10 ENCOUNTER — Encounter (HOSPITAL_COMMUNITY): Payer: Medicare Other

## 2014-09-10 DIAGNOSIS — I5043 Acute on chronic combined systolic (congestive) and diastolic (congestive) heart failure: Secondary | ICD-10-CM

## 2014-09-10 NOTE — Telephone Encounter (Signed)
New message      Pt was discharged from the hosp recently.  She want to go over the discharge medications with a nurse

## 2014-09-10 NOTE — Progress Notes (Signed)
CARE MANAGEMENT NOTE 09/10/2014  Patient:  GWYNN, CHALKER   Account Number:  [id number]  Date Initiated:  09/10/2014  Documentation initiated by:  [facility]  Subjective/Objective Assessment:   HTN     Action/Plan:   Anticipated DC Date:  09/09/2014   Anticipated DC Plan:  [place]  CM consult      Choice offered to / List presented to:             Status of service:  Completed, signed off Medicare Important Message given?  NA - LOS <3 / Initial given by admissions (If response is "NO", the following Medicare IM given date fields will be blank) Date Medicare IM given:   Medicare IM given by:   Date Additional Medicare IM given:   Additional Medicare IM given by:    Discharge Disposition:  HOME/SELF CARE  Per UR Regulation:    If discussed at Long Length of Stay Meetings, dates discussed:    Comments:  09/10/2014 0950 Chart reviewed. Pt has insurance to cover meds. Has follow up appt with [facility] on 3/22. Pt contacted [facility], and spoke to nurse. Pt instructed by nurse to follow up with [facility] lab for BMET in 1 week. No NCM needs identified. Jonnie Finner RN CCM Case Mgmt phone [phone]

## 2014-09-10 NOTE — Telephone Encounter (Signed)
Returned call to patient she stated she was discharged from hospital 09/09/14.Stated she wanted to ask Dr.Jordan if ok to take medications as directed on discharge summary.Stated he wanted her to take losartan 50 mg daily and on discharge meds it says losartan 25 mg daily.Stated she was told to have bmet in 1 week.Advised ok to go to [facility] lab 09/17/14.Message sent to [place] for advice.

## 2014-09-11 NOTE — Progress Notes (Signed)
Utilization review complete. Dameka Younker RN CCM Case Mgmt phone [phone] 

## 2014-09-11 NOTE — Telephone Encounter (Signed)
I thinks she should take 50 mg of losartan daily. Her initial consult recommended this but dose was never increased in hospital.  Peter Martinique MD, [facility]

## 2014-09-12 ENCOUNTER — Encounter (HOSPITAL_COMMUNITY): Payer: Medicare Other

## 2014-09-14 ENCOUNTER — Encounter (HOSPITAL_COMMUNITY): Payer: Medicare Other

## 2014-09-14 NOTE — Telephone Encounter (Signed)
R

## 2014-09-17 ENCOUNTER — Encounter (HOSPITAL_COMMUNITY): Payer: Medicare Other

## 2014-09-17 DIAGNOSIS — E785 Hyperlipidemia, unspecified: Secondary | ICD-10-CM | POA: Diagnosis not present

## 2014-09-17 NOTE — Telephone Encounter (Signed)
Returned call to patient 09/13/14 she stated her B/P has been low ranging 119/77,87/58,81/50,87/58,133/74,97/61,101/58.Dr.Jordan advised take Losartan 25 mg daily.

## 2014-09-18 LAB — HEPATIC FUNCTION PANEL
ALT: 10 U/L (ref 0–35)
AST: 16 U/L (ref 0–37)
Albumin: 4.1 g/dL (ref 3.5–5.2)
Alkaline Phosphatase: 112 U/L (ref 39–117)
BILIRUBIN DIRECT: 0.2 mg/dL (ref 0.0–0.3)
BILIRUBIN INDIRECT: 0.5 mg/dL (ref 0.2–1.2)
Total Bilirubin: 0.7 mg/dL (ref 0.2–1.2)
Total Protein: 6.9 g/dL (ref 6.0–8.3)

## 2014-09-19 ENCOUNTER — Encounter (HOSPITAL_COMMUNITY): Payer: Medicare Other

## 2014-09-19 ENCOUNTER — Other Ambulatory Visit: Payer: Self-pay

## 2014-09-19 DIAGNOSIS — I2102 ST elevation (STEMI) myocardial infarction involving left anterior descending coronary artery: Secondary | ICD-10-CM | POA: Diagnosis not present

## 2014-09-19 DIAGNOSIS — E785 Hyperlipidemia, unspecified: Secondary | ICD-10-CM

## 2014-09-21 ENCOUNTER — Encounter (HOSPITAL_COMMUNITY): Payer: Medicare Other

## 2014-09-21 DIAGNOSIS — E785 Hyperlipidemia, unspecified: Secondary | ICD-10-CM | POA: Diagnosis not present

## 2014-09-21 DIAGNOSIS — I2102 ST elevation (STEMI) myocardial infarction involving left anterior descending coronary artery: Secondary | ICD-10-CM | POA: Diagnosis not present

## 2014-09-21 LAB — BASIC METABOLIC PANEL
BUN: 19 mg/dL (ref 6–23)
CO2: 29 meq/L (ref 19–32)
Calcium: 10.2 mg/dL (ref 8.4–10.5)
Chloride: 102 mEq/L (ref 96–112)
Creat: 0.78 mg/dL (ref 0.50–1.10)
GLUCOSE: 84 mg/dL (ref 70–99)
Potassium: 4.9 mEq/L (ref 3.5–5.3)
Sodium: 139 mEq/L (ref 135–145)

## 2014-09-21 LAB — LIPID PANEL
Cholesterol: 150 mg/dL (ref 0–200)
HDL: 58 mg/dL (ref >=46)
LDL CALC: 76 mg/dL (ref 0–99)
TRIGLYCERIDES: 79 mg/dL (ref <150)
Total CHOL/HDL Ratio: 2.6 Ratio
VLDL: 16 mg/dL (ref 0–40)

## 2014-09-24 ENCOUNTER — Encounter (HOSPITAL_COMMUNITY): Payer: Medicare Other

## 2014-09-25 ENCOUNTER — Ambulatory Visit: Payer: Medicare Other | Admitting: Cardiology

## 2014-09-25 ENCOUNTER — Telehealth: Payer: Self-pay | Admitting: Cardiology

## 2014-09-26 ENCOUNTER — Encounter (HOSPITAL_COMMUNITY): Payer: Medicare Other

## 2014-09-26 ENCOUNTER — Ambulatory Visit (INDEPENDENT_AMBULATORY_CARE_PROVIDER_SITE_OTHER): Payer: Medicare Other | Admitting: Cardiology

## 2014-09-26 ENCOUNTER — Encounter: Payer: Self-pay | Admitting: Cardiology

## 2014-09-26 ENCOUNTER — Ambulatory Visit: Payer: Medicare Other | Admitting: Cardiology

## 2014-09-26 VITALS — BP 113/65 | HR 50 | Ht 65.5 in | Wt 135.2 lb

## 2014-09-26 DIAGNOSIS — I5022 Chronic systolic (congestive) heart failure: Secondary | ICD-10-CM

## 2014-09-26 DIAGNOSIS — I255 Ischemic cardiomyopathy: Secondary | ICD-10-CM

## 2014-09-26 MED ORDER — FUROSEMIDE 20 MG PO TABS: 20.0000 mg | ORAL_TABLET | Freq: Every day | ORAL | Status: DC

## 2014-09-26 NOTE — Telephone Encounter (Signed)
Close encounter 

## 2014-09-26 NOTE — Patient Instructions (Signed)
A refill for your Furosemide has been sent to your pharmacy.  Keep your April appointment with Dr. Martinique.

## 2014-09-26 NOTE — Progress Notes (Signed)
09/26/2014 Varney Baas Duecker   September 06, 1945  [id number]  Primary Physician Tommy Medal, MD Primary Cardiologist: Dr.Jordan  Reason for Visit/CC: Follow-up for CAD and CHF  HPI:  The patient is a 69 y/o female, followed by Dr. Martinique, who presents to clinic today for post hospital f/u for acute CHF exacerbation. She is a 69 yo WF who presented in September 2015 with an Anterior STEMI. She underwent emergent stenting of the proximal LAD occlusion with a BMS. She also had 50-70% left main disease, 70-80% ostial LAD disease and 50-70% ostial LCx disease. EF was 25-30%. She was placed on high dose lipitor therapy but in November was noted to have marked elevation of her transaminases and her statin was discontinued with improvement. LFTs are now back to normal. She is now back on low dose Crestor. In November she returned for CABG by Dr. Cyndia Bent with LIMA to the LAD, SVG to diag, SVG to OM, and SVG to RCA.  She also developed a rash to both Brilinta and Plavix. She is on mono antiplatelet therapy with ASA. Post CABG echo showed improvement in EF to 35-40%.  She was recently admitted to [facility] on 09/07/14 for acute CHF. She was treated with IV Lasix and had good diuresis. She was continued on BB therapy with Coreg and ARB therapy with losartan. Low dose spironolactone was also added. Repeat BMP 09/19/14 showed stable renal function and normal potassium level.   She presents back to clinic today for follow-up. She reports that she has done well. She denies any further dyspnea. No orthopnea, PND or LEE. No chest pain. She checks her weight daily. No weigh gain. Dry weight is 136 lbs based on home scales. She reports adherence to low sodium diet.     Current Outpatient Prescriptions  Medication Sig Dispense Refill  . acetaminophen (TYLENOL) 500 MG tablet Take 500 mg by mouth every 6 (six) hours as needed.    Marland Kitchen aspirin EC 81 MG tablet Take 81 mg by mouth daily.    . carvedilol (COREG) 6.25 MG tablet Take 3.125 mg  by mouth 2 (two) times daily.  6  . eszopiclone (LUNESTA) 1 MG TABS tablet Take 0.5 mg by mouth daily.     . furosemide (LASIX) 20 MG tablet Take 1 tablet (20 mg total) by mouth daily. 30 tablet 0  . losartan (COZAAR) 25 MG tablet Take 1 tablet (25 mg total) by mouth daily. 30 tablet 0  . rosuvastatin (CRESTOR) 10 MG tablet Take 1 tablet (10 mg total) by mouth daily. 30 tablet 0  . spironolactone (ALDACTONE) 25 MG tablet Take 0.5 tablets (12.5 mg total) by mouth daily. 30 tablet 0   No current facility-administered medications for this visit.   Facility-Administered Medications Ordered in Other Visits  Medication Dose Route Frequency Provider Last Rate Last Dose  . fentaNYL (SUBLIMAZE) injection    Anesthesia Intra-op Zorita Pang, CRNA   100 mcg at 05/01/14 0636  . midazolam (VERSED) 5 MG/5ML injection    Anesthesia Intra-op Zorita Pang, CRNA   2 mg at 05/01/14 0630    Allergies  Allergen Reactions  . Demerol [Meperidine] Nausea Only  . Lipitor [Atorvastatin] Itching  . Lisinopril Cough  . Meperidine Hcl Nausea Only  . Plavix [Clopidogrel Bisulfate]     Rash   . Dexilant [Dexlansoprazole] Rash  . Latex Rash  . Sulfonamide Derivatives Rash  . Ticagrelor Itching and Rash    History   Social History  . Marital  Status: Married    Spouse Name: N/A  . Number of Children: N/A  . Years of Education: N/A   Occupational History  . Not on file.   Social History Main Topics  . Smoking status: Never Smoker   . Smokeless tobacco: Never Used  . Alcohol Use: Yes     Comment: rare glass of wine  . Drug Use: No  . Sexual Activity: Yes    Birth Control/ Protection: Post-menopausal   Other Topics Concern  . Not on file   Social History Narrative     Review of Systems: General: negative for chills, fever, night sweats or weight changes.  Cardiovascular: negative for chest pain, dyspnea on exertion, edema, orthopnea, palpitations, paroxysmal nocturnal dyspnea or shortness of  breath Dermatological: negative for rash Respiratory: negative for cough or wheezing Urologic: negative for hematuria Abdominal: negative for nausea, vomiting, diarrhea, bright red blood per rectum, melena, or hematemesis Neurologic: negative for visual changes, syncope, or dizziness All other systems reviewed and are otherwise negative except as noted above.    Blood pressure 113/65, pulse 50, height 5' 5.5" (1.664 m), weight 135 lb 3.2 oz (61.326 kg).  General appearance: alert, cooperative and no distress Neck: no carotid bruit and no JVD Lungs: clear to auscultation bilaterally Heart: regular rate and rhythm, S1, S2 normal, no murmur, click, rub or gallop Extremities: no LEE Pulses: 2+ and symmetric Skin: no LEE Neurologic: Grossly normal  EKG Not performed  ASSESSMENT AND PLAN:   1. Acute on chronic combined systolic and diastolic heart failure - Stable. Euvolemic on physical exam. No dyspnea. Renal function and K both stable with addition of spironolactone. Continue coreg and losartan. There is no room to up titrate meds due to soft BP and bradycardia. Continue lasix, daily weights and low sodium diet.   2. CAD s/p CABG 4 in November 2015 - s/p anterior STEMI s/p BMS to ostial LAD - s/p 4v CABG in Nov 2015 LIMA to LAD, SVG to diagonal, SVG to OM, and SVG to RCA - no sign of recent angina  -continue ASA, BB, statin, ARB  3. Ischemic cardiomyopathy with improved EF to 35-40%  -Continue ARB, BB and spironolactone  4. Hypertension   - well controlled  5. Hyperlipidemia:   - significant reduction in LDL on Crestor. Down from 183 mg/dL 6 months ago, to 76 mg/dL. LFTs normalized  6. Bradycardia:   - HR in the 50s while on BB therapy. Asymptomatic. Continue low dose Coreg for CHF and CAD.    PLAN  Patient is doing well since discharge. She is well compensated in regards to her HF. BP and HR both stable. Continue current  medications and cardiac rehab. F/U with Dr. Martinique on 10/22/14.   Adiana Smelcer, [place] 09/26/2014 2:05 PM

## 2014-09-28 ENCOUNTER — Encounter (HOSPITAL_COMMUNITY)
Admission: RE | Admit: 2014-09-28 | Discharge: 2014-09-28 | Disposition: A | Discharge status: Home / Self Care | Payer: Medicare Other | Source: Ambulatory Visit | Attending: Cardiology | Admitting: Cardiology

## 2014-09-28 DIAGNOSIS — I2510 Atherosclerotic heart disease of native coronary artery without angina pectoris: Secondary | ICD-10-CM | POA: Insufficient documentation

## 2014-09-28 DIAGNOSIS — Z951 Presence of aortocoronary bypass graft: Secondary | ICD-10-CM | POA: Insufficient documentation

## 2014-10-05 ENCOUNTER — Encounter (HOSPITAL_COMMUNITY)
Admission: RE | Admit: 2014-10-05 | Discharge: 2014-10-05 | Disposition: A | Discharge status: Home / Self Care | Payer: Medicare Other | Source: Ambulatory Visit | Attending: Cardiology | Admitting: Cardiology

## 2014-10-05 DIAGNOSIS — I2510 Atherosclerotic heart disease of native coronary artery without angina pectoris: Secondary | ICD-10-CM | POA: Insufficient documentation

## 2014-10-05 DIAGNOSIS — Z951 Presence of aortocoronary bypass graft: Secondary | ICD-10-CM | POA: Diagnosis not present

## 2014-10-08 ENCOUNTER — Encounter (HOSPITAL_COMMUNITY)
Admission: RE | Admit: 2014-10-08 | Discharge: 2014-10-08 | Disposition: A | Discharge status: Home / Self Care | Payer: Medicare Other | Source: Ambulatory Visit | Attending: Cardiology | Admitting: Cardiology

## 2014-10-08 DIAGNOSIS — I2510 Atherosclerotic heart disease of native coronary artery without angina pectoris: Secondary | ICD-10-CM | POA: Diagnosis not present

## 2014-10-08 DIAGNOSIS — Z951 Presence of aortocoronary bypass graft: Secondary | ICD-10-CM | POA: Diagnosis not present

## 2014-10-10 ENCOUNTER — Encounter (HOSPITAL_COMMUNITY)
Admission: RE | Admit: 2014-10-10 | Discharge: 2014-10-10 | Disposition: A | Discharge status: Home / Self Care | Payer: Medicare Other | Source: Ambulatory Visit | Attending: Cardiology | Admitting: Cardiology

## 2014-10-10 ENCOUNTER — Telehealth: Payer: Self-pay | Admitting: Cardiology

## 2014-10-10 DIAGNOSIS — Z951 Presence of aortocoronary bypass graft: Secondary | ICD-10-CM | POA: Diagnosis not present

## 2014-10-10 DIAGNOSIS — I2510 Atherosclerotic heart disease of native coronary artery without angina pectoris: Secondary | ICD-10-CM | POA: Diagnosis not present

## 2014-10-10 NOTE — Telephone Encounter (Signed)
Mrs. Bove is wanting to speak to some one about some very low blood pressure reading that she is getting . Please call    Thanks

## 2014-10-10 NOTE — Telephone Encounter (Signed)
Returned call to patient.She stated she has been having low B/P.Stated B/P lower at Cardiac Rehab this morning 80/48.Stated B/P low at home ranging 87/50,91/54,101/61,118/53.Pulse 51,54 bpm.Stated she has occasional blurred vision,light headed.Message sent to [place] for advice.

## 2014-10-11 NOTE — Addendum Note (Signed)
Addended by: Golden Hurter D on: 10/11/2014 04:41 PM   Modules accepted: Orders, Medications

## 2014-10-11 NOTE — Telephone Encounter (Signed)
She should stop aldactone and hold coreg for now.  Kameelah Minish Martinique MD, [facility]

## 2014-10-11 NOTE — Telephone Encounter (Signed)
Returned call to patient no answer.Left message on personal voice mail Dr.Jordan advised to stop aldactone and hold coreg for now.Advised to continue to monitor B/P and call back if needed.

## 2014-10-12 ENCOUNTER — Encounter (HOSPITAL_COMMUNITY)
Admission: RE | Admit: 2014-10-12 | Discharge: 2014-10-12 | Disposition: A | Discharge status: Home / Self Care | Payer: Medicare Other | Source: Ambulatory Visit | Attending: Cardiology | Admitting: Cardiology

## 2014-10-12 DIAGNOSIS — Z951 Presence of aortocoronary bypass graft: Secondary | ICD-10-CM | POA: Diagnosis not present

## 2014-10-12 DIAGNOSIS — I2510 Atherosclerotic heart disease of native coronary artery without angina pectoris: Secondary | ICD-10-CM | POA: Diagnosis not present

## 2014-10-15 ENCOUNTER — Encounter (HOSPITAL_COMMUNITY)
Admission: RE | Admit: 2014-10-15 | Discharge: 2014-10-15 | Disposition: A | Discharge status: Home / Self Care | Payer: Medicare Other | Source: Ambulatory Visit | Attending: Cardiology | Admitting: Cardiology

## 2014-10-15 DIAGNOSIS — I2510 Atherosclerotic heart disease of native coronary artery without angina pectoris: Secondary | ICD-10-CM | POA: Diagnosis not present

## 2014-10-15 DIAGNOSIS — Z951 Presence of aortocoronary bypass graft: Secondary | ICD-10-CM | POA: Diagnosis not present

## 2014-10-17 ENCOUNTER — Encounter (HOSPITAL_COMMUNITY)
Admission: RE | Admit: 2014-10-17 | Discharge: 2014-10-17 | Disposition: A | Discharge status: Home / Self Care | Payer: Medicare Other | Source: Ambulatory Visit | Attending: Cardiology | Admitting: Cardiology

## 2014-10-17 DIAGNOSIS — Z951 Presence of aortocoronary bypass graft: Secondary | ICD-10-CM | POA: Diagnosis not present

## 2014-10-17 DIAGNOSIS — I2510 Atherosclerotic heart disease of native coronary artery without angina pectoris: Secondary | ICD-10-CM | POA: Diagnosis not present

## 2014-10-19 ENCOUNTER — Encounter (HOSPITAL_COMMUNITY)
Admission: RE | Admit: 2014-10-19 | Discharge: 2014-10-19 | Disposition: A | Discharge status: Home / Self Care | Payer: Medicare Other | Source: Ambulatory Visit | Attending: Cardiology | Admitting: Cardiology

## 2014-10-19 DIAGNOSIS — I2510 Atherosclerotic heart disease of native coronary artery without angina pectoris: Secondary | ICD-10-CM | POA: Diagnosis not present

## 2014-10-19 DIAGNOSIS — Z951 Presence of aortocoronary bypass graft: Secondary | ICD-10-CM | POA: Diagnosis not present

## 2014-10-22 ENCOUNTER — Ambulatory Visit (INDEPENDENT_AMBULATORY_CARE_PROVIDER_SITE_OTHER): Payer: Medicare Other | Admitting: Cardiology

## 2014-10-22 ENCOUNTER — Ambulatory Visit: Payer: Medicare Other | Admitting: Cardiology

## 2014-10-22 ENCOUNTER — Encounter: Payer: Self-pay | Admitting: Cardiology

## 2014-10-22 ENCOUNTER — Encounter (HOSPITAL_COMMUNITY): Payer: Medicare Other

## 2014-10-22 VITALS — BP 100/60 | HR 60 | Ht 65.0 in | Wt 136.0 lb

## 2014-10-22 DIAGNOSIS — I2510 Atherosclerotic heart disease of native coronary artery without angina pectoris: Secondary | ICD-10-CM

## 2014-10-22 DIAGNOSIS — I255 Ischemic cardiomyopathy: Secondary | ICD-10-CM

## 2014-10-22 DIAGNOSIS — Z951 Presence of aortocoronary bypass graft: Secondary | ICD-10-CM

## 2014-10-22 DIAGNOSIS — E785 Hyperlipidemia, unspecified: Secondary | ICD-10-CM | POA: Diagnosis not present

## 2014-10-22 DIAGNOSIS — I5022 Chronic systolic (congestive) heart failure: Secondary | ICD-10-CM

## 2014-10-22 MED ORDER — ROSUVASTATIN CALCIUM 10 MG PO TABS: 10.0000 mg | ORAL_TABLET | Freq: Every day | ORAL | Status: DC

## 2014-10-22 NOTE — Progress Notes (Signed)
Kelli Roy Date of Birth: 1946-04-29 Medical Record #[id number]  History of Present Illness: Kelli Roy is seen today for follow up of CAD and CHF. She is a 69 yo WF who presented in September 2015 with an Anterior STEMI. She underwent emergent stenting of the proximal LAD occlusion with a BMS. She also had 50-70% left main disease, 70-80% ostial LAD disease and 50-70% ostial LCx disease. EF was 25-30%. She was placed on high dose lipitor therapy but in November was noted to have marked elevation of her transaminases and her statin was discontinued with improvement. In November she returned for CABG by Dr. Cyndia Bent with LIMA to the LAD, SVG to diag, SVG to OM, and SVG to RCA. TEE showed improvement in EF to 50%. She also developed a rash to both Brilinta and Plavix.  She had repeat Echo in February that showed an EF of 35-40% with akinesis of the antero-septum and apex. In March she was admitted with acute CHF. She was diuresed with IV lasix and was DC home with addition of lasix and aldactone. 2 weeks ago she developed profound hypotension and Coreg and aldactone were stopped. BP has improved since then. No dizziness. Weight stable at home. No edema. Breathing is good.     Medication List       This list is accurate as of: 10/22/14 12:23 PM.  Always use your most recent med list.               acetaminophen 500 MG tablet  Commonly known as:  TYLENOL  Take 500 mg by mouth every 6 (six) hours as needed.     aspirin EC 81 MG tablet  Take 81 mg by mouth daily.     eszopiclone 1 MG Tabs tablet  Commonly known as:  LUNESTA  Take 0.5 mg by mouth daily.     furosemide 20 MG tablet  Commonly known as:  LASIX  Take 1 tablet (20 mg total) by mouth daily.     losartan 25 MG tablet  Commonly known as:  COZAAR  Take 1 tablet (25 mg total) by mouth daily.     rosuvastatin 10 MG tablet  Commonly known as:  CRESTOR  Take 1 tablet (10 mg total) by mouth daily.         Allergies  Allergen  Reactions  . Demerol [Meperidine] Nausea Only  . Lipitor [Atorvastatin] Itching  . Lisinopril Cough  . Meperidine Hcl Nausea Only  . Plavix [Clopidogrel Bisulfate]     Rash   . Dexilant [Dexlansoprazole] Rash  . Latex Rash  . Sulfonamide Derivatives Rash  . Ticagrelor Itching and Rash    Past Medical History  Diagnosis Date  . Hypertension   . Hyperlipidemia   . Malaise and fatigue   . Insomnia   . Family history of colon cancer   . Family history of cardiovascular disease   . History of long-term treatment with high-risk medication   . Sinus headache   . Alopecia   . CAD (coronary artery disease)     a. 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 Cyndia Bent   . Ischemic cardiomyopathy     a. 03/2014 Echo: EF 25-30%, mid-apical/anteroseptal and inf AK, Gr 2 DD, ?  apical thrombus, Mild MR.-->Discharged with LifeVest.  . Chronic systolic CHF (congestive heart failure)     a. 03/2014 Echo: EF 25-30%; Pre op TEE 05/2014 with EF of 50%  . Myocardial infarction 03/2014  . Shortness of breath dyspnea     Past Surgical History  Procedure Laterality Date  . Cardiac catheterization  2001    showed no blockages  . Cystectomy  1998    fibroid cysts  . 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 Cervical spine surgery  09/27/2003    C6-67 servical fusion  . Dilation and curettage of uterus  1976    2nd to miscarriage  . Cataract extraction w/phaco Left 01/04/2014    Procedure: CATARACT EXTRACTION PHACO AND INTRAOCULAR LENS PLACEMENT (IOC);  Surgeon: Tonny Branch, MD;  Location: [facility];  Service: Ophthalmology;  Laterality: Left;  CDE:5.65  . Cataract extraction w/phaco Right 01/29/2014    Procedure: CATARACT EXTRACTION PHACO AND INTRAOCULAR  LENS PLACEMENT RIGHT EYE CDE=4.49;  Surgeon: Tonny Branch, MD;  Location: [facility];  Service: Ophthalmology;  Laterality: Right;  . Eye surgery    . Coronary artery bypass graft N/A 05/21/2014    Procedure: CORONARY ARTERY BYPASS GRAFTING (CABG);  Surgeon: Gaye Pollack, MD;  Location: [place];  Service: Open Heart Surgery;  Laterality: N/A;  . Intraoperative transesophageal echocardiogram N/A 05/21/2014    Procedure: INTRAOPERATIVE TRANSESOPHAGEAL ECHOCARDIOGRAM;  Surgeon: Gaye Pollack, MD;  Location: [facility] OR;  Service: Open Heart Surgery;  Laterality: N/A;  . Left heart catheterization with coronary angiogram N/A 03/08/2014    Procedure: LEFT HEART CATHETERIZATION WITH CORONARY ANGIOGRAM;  Surgeon: Aveon Colquhoun M Martinique, MD;  Location: [facility] CATH LAB;  Service: Cardiovascular;  Laterality: N/A;  . Percutaneous coronary stent intervention (pci-s)  03/08/2014    Procedure: PERCUTANEOUS CORONARY STENT INTERVENTION (PCI-S);  Surgeon: Ilham Roughton M Martinique, MD;  Location: [facility] CATH LAB;  Service: Cardiovascular;;  prox lad  bms    History   Social History  . Marital Status: Married    Spouse Name: N/A  . Number of Children: N/A  . Years of Education: N/A   Social History Main Topics  . Smoking status: Never Smoker   . Smokeless tobacco: Never Used  . Alcohol Use: Yes     Comment: rare glass of wine  . Drug Use: No  . Sexual Activity: Yes    Birth Control/ Protection: Post-menopausal   Other Topics Concern  . None   Social History Narrative    Family History  Problem Relation Age of Onset  . Stroke Mother   . Heart attack Mother   . Peripheral vascular disease Father     had stent in leg  . Colon cancer Father   . Atrial fibrillation Father     with pacemaker  . Thyroid disease Father   . Pancreatic cancer Brother   . Coronary artery disease Brother     with Stent  . Coronary artery disease Brother     with CABG in his 33's  . Thyroid disease Brother   . Thyroid disease Sister     Review of  Systems: As noted in HPI.  All other systems were reviewed and are negative.  Physical Exam: BP 100/60 mmHg  Pulse 60  Ht 5\' 5"  (1.651 m)  Wt 136 lb (61.689 kg)  BMI 22.63 kg/m2 Filed Weights   10/22/14 1011  Weight: 136 lb (61.689 kg)  GENERAL:  Well appearing  WF in NAD HEENT:  PERRL, EOMI, sclera are clear. Oropharynx is clear. NECK:  No jugular venous distention, carotid upstroke brisk and symmetric, no bruits, no thyromegaly or adenopathy LUNGS:  Clear to auscultation bilaterally CHEST:  Unremarkable, sternotomy scar has healed well. HEART:  RRR,  PMI not displaced or sustained,S1 and S2 within normal limits, no S3, no S4: no clicks, no rubs, no murmurs ABD:  Soft, nontender. BS +, no masses or bruits. No hepatomegaly, no splenomegaly EXT:  2 + pulses throughout, no edema, no cyanosis no clubbing SKIN:  Warm and dry.  No rashes NEURO:  Alert and oriented x 3. Cranial nerves II through XII intact. PSYCH:  Cognitively intact    LABORATORY DATA: Lab Results  Component Value Date   WBC 7.5 09/07/2014   HGB 14.7 09/07/2014   HCT 44.1 09/07/2014   PLT 180 09/07/2014   GLUCOSE 84 09/19/2014   CHOL 150 09/21/2014   TRIG 79 09/21/2014   HDL 58 09/21/2014   LDLCALC 76 09/21/2014   ALT 10 09/17/2014   AST 16 09/17/2014   NA 139 09/19/2014   K 4.9 09/19/2014   CL 102 09/19/2014   CREATININE 0.78 09/19/2014   BUN 19 09/19/2014   CO2 29 09/19/2014   INR 1.46 05/21/2014   HGBA1C 6.1* 05/17/2014     Assessment / Plan: 1. CAD s/p anterior STEMI in Sept. 2015 treated with BMS of proximal LAD. S/p CABG November 2015. Good clinical recovery. Continue cardiac Rehab.  Continue ASA only at this point due to rash on Plavix and Brilinta.  2. Ischemic cardiomyopathy with chronic systolic CHF. EF 35-40%. Well compensated. On lasix and losartan. Coreg and aldactone held due to hypotension. Will reassess in 2 months and if improved will try adding back at low dose. Sodium restriction.    3. Hyperlipidemia. Intolerance to high dose lipitor due to transaminitis. Tolerating Crestor well with good response.   .   I will follow up in 6 weeks.

## 2014-10-22 NOTE — Patient Instructions (Signed)
Continue your current therapy  I will see you in 6 weeks.

## 2014-10-23 ENCOUNTER — Other Ambulatory Visit: Payer: Self-pay

## 2014-10-23 MED ORDER — ROSUVASTATIN CALCIUM 10 MG PO TABS: 10.0000 mg | ORAL_TABLET | Freq: Every day | ORAL | Status: DC

## 2014-10-24 ENCOUNTER — Encounter (HOSPITAL_COMMUNITY)
Admission: RE | Admit: 2014-10-24 | Discharge: 2014-10-24 | Disposition: A | Discharge status: Home / Self Care | Payer: Medicare Other | Source: Ambulatory Visit | Attending: Cardiology | Admitting: Cardiology

## 2014-10-24 DIAGNOSIS — Z951 Presence of aortocoronary bypass graft: Secondary | ICD-10-CM | POA: Diagnosis not present

## 2014-10-24 DIAGNOSIS — I2510 Atherosclerotic heart disease of native coronary artery without angina pectoris: Secondary | ICD-10-CM | POA: Diagnosis not present

## 2014-10-26 ENCOUNTER — Encounter (HOSPITAL_COMMUNITY)
Admission: RE | Admit: 2014-10-26 | Discharge: 2014-10-26 | Disposition: A | Discharge status: Home / Self Care | Payer: Medicare Other | Source: Ambulatory Visit | Attending: Cardiology | Admitting: Cardiology

## 2014-10-26 DIAGNOSIS — Z951 Presence of aortocoronary bypass graft: Secondary | ICD-10-CM | POA: Diagnosis not present

## 2014-10-26 DIAGNOSIS — I2510 Atherosclerotic heart disease of native coronary artery without angina pectoris: Secondary | ICD-10-CM | POA: Diagnosis not present

## 2014-10-29 ENCOUNTER — Encounter (HOSPITAL_COMMUNITY)
Admission: RE | Admit: 2014-10-29 | Discharge: 2014-10-29 | Disposition: A | Discharge status: Home / Self Care | Payer: Medicare Other | Source: Ambulatory Visit | Attending: Cardiology | Admitting: Cardiology

## 2014-10-29 DIAGNOSIS — Z951 Presence of aortocoronary bypass graft: Secondary | ICD-10-CM | POA: Diagnosis not present

## 2014-10-29 DIAGNOSIS — I2510 Atherosclerotic heart disease of native coronary artery without angina pectoris: Secondary | ICD-10-CM | POA: Diagnosis not present

## 2014-10-31 ENCOUNTER — Encounter (HOSPITAL_COMMUNITY)
Admission: RE | Admit: 2014-10-31 | Discharge: 2014-10-31 | Disposition: A | Discharge status: Home / Self Care | Payer: Medicare Other | Source: Ambulatory Visit | Attending: Cardiology | Admitting: Cardiology

## 2014-10-31 DIAGNOSIS — Z951 Presence of aortocoronary bypass graft: Secondary | ICD-10-CM | POA: Diagnosis not present

## 2014-10-31 DIAGNOSIS — I2510 Atherosclerotic heart disease of native coronary artery without angina pectoris: Secondary | ICD-10-CM | POA: Diagnosis not present

## 2014-10-31 NOTE — Addendum Note (Signed)
Encounter addended by: Zada Girt, CCT on: 10/31/2014  2:20 PM<BR>     Documentation filed: Flowsheet VN

## 2014-11-02 ENCOUNTER — Encounter (HOSPITAL_COMMUNITY)
Admission: RE | Admit: 2014-11-02 | Discharge: 2014-11-02 | Disposition: A | Discharge status: Home / Self Care | Payer: Medicare Other | Source: Ambulatory Visit | Attending: Cardiology | Admitting: Cardiology

## 2014-11-02 DIAGNOSIS — Z951 Presence of aortocoronary bypass graft: Secondary | ICD-10-CM | POA: Diagnosis not present

## 2014-11-02 DIAGNOSIS — I2510 Atherosclerotic heart disease of native coronary artery without angina pectoris: Secondary | ICD-10-CM | POA: Diagnosis not present

## 2014-11-02 NOTE — Progress Notes (Signed)
Cardiac Rehabilitation Program Outcomes Report   Orientation:  06/26/14 Graduate Date:  11/02/14 Discharge Date:  11/02/14 # of sessions completed: 36  Cardiologist: Martinique Family MD:  Francoise Ceo Time:  1100  A.  Exercise Program:  Tolerates exercise @ 3.90 METS for 15 minutes and Walk Test Results:  Post: 3.61  B.  Mental Health:  Good mental attitude  C.  Education/Instruction/Skills  Accurately checks own pulse.  Rest:  63  Exercise:  103, Knows THR for exercise, Uses Perceived Exertion Scale and/or Dyspnea Scale and Attended 11 education classes  Home exercise given: Exercise Prescription given and pt states is going to join maintenance in June.  D.  Nutrition/Weight Control/Body Composition:  Adherence to prescribed nutrition program: good    E.  Blood Lipids    Lab Results  Component Value Date   CHOL 150 09/21/2014   HDL 58 09/21/2014   LDLCALC 76 09/21/2014   TRIG 79 09/21/2014   CHOLHDL 2.6 09/21/2014    F.  Lifestyle Changes:  Making positive lifestyle changes  G.  Symptoms noted with exercise:  Asymptomatic  Report Completed By:  Stevphen Rochester RN    Comments:  Patient has graduated [facility] today. Patient has done very well in program and wants to join maintenance in June.

## 2014-11-02 NOTE — Progress Notes (Signed)
Patient is discharged from [facility] program today, November 02, 2014 with 36 sessions.  She achieved LTG of 30 minutes of aerobic exercise at max met level of 3.90.  All patient vitals are WNL.  Patient has met with dietician.  Discharge instructions have been reviewed in detail and patient expressed an understanding of material given.  Patient plans to exercise at home and join the maintenance program in June.  Cardiac Rehab will make 1 month, 6 month and 1 year call backs.  Patient had no complaints of any abnormal S/S or pain on their exit visit.  Pt finished exit walk test.

## 2014-12-10 ENCOUNTER — Encounter: Payer: Self-pay | Admitting: Cardiology

## 2014-12-10 ENCOUNTER — Ambulatory Visit (INDEPENDENT_AMBULATORY_CARE_PROVIDER_SITE_OTHER): Payer: Medicare Other | Admitting: Cardiology

## 2014-12-10 VITALS — BP 120/56 | HR 50 | Ht 65.0 in | Wt 137.6 lb

## 2014-12-10 DIAGNOSIS — I5022 Chronic systolic (congestive) heart failure: Secondary | ICD-10-CM | POA: Diagnosis not present

## 2014-12-10 DIAGNOSIS — I255 Ischemic cardiomyopathy: Secondary | ICD-10-CM | POA: Diagnosis not present

## 2014-12-10 DIAGNOSIS — I2510 Atherosclerotic heart disease of native coronary artery without angina pectoris: Secondary | ICD-10-CM

## 2014-12-10 DIAGNOSIS — Z951 Presence of aortocoronary bypass graft: Secondary | ICD-10-CM

## 2014-12-10 DIAGNOSIS — E785 Hyperlipidemia, unspecified: Secondary | ICD-10-CM

## 2014-12-10 MED ORDER — LOSARTAN POTASSIUM 50 MG PO TABS: 50.0000 mg | ORAL_TABLET | Freq: Every day | ORAL | Status: DC

## 2014-12-10 NOTE — Patient Instructions (Signed)
Increase losartan to 50 mg daily  Continue your other therapy  I will see you in 6 months   

## 2014-12-11 NOTE — Progress Notes (Signed)
Kelli Roy Date of Birth: 1945-10-01 Medical Record #[id number]  History of Present Illness: Kelli Roy is seen today for follow up of CAD and CHF. She presented in September 2015 with an Anterior STEMI. She underwent emergent stenting of the proximal LAD occlusion with a BMS. She also had 50-70% left main disease, 70-80% ostial LAD disease and 50-70% ostial LCx disease. EF was 25-30%. She was placed on high dose lipitor therapy but in November was noted to have marked elevation of her transaminases and her statin was discontinued with improvement. In November she returned for CABG by Dr. Cyndia Roy with LIMA to the LAD, SVG to diag, SVG to OM, and SVG to RCA. TEE showed improvement in EF to 50%. She also developed a rash to both Brilinta and Plavix.  She had repeat Echo in February that showed an EF of 35-40% with akinesis of the antero-septum and apex. In March she was admitted with acute CHF. She was diuresed with IV lasix and was DC home with addition of lasix and aldactone. She later developed hypotension and Coreg and aldactone were held.  On follow up today she is doing very well. Still has mild chest soreness with a really deep breath. No dyspnea. No edema. Energy level is good. She is interested in continuing Cardiac Rehab maintenance.     Medication List       This list is accurate as of: 12/10/14 11:59 PM.  Always use your most recent med list.               acetaminophen 500 MG tablet  Commonly known as:  TYLENOL  Take 500 mg by mouth every 6 (six) hours as needed.     aspirin EC 81 MG tablet  Take 81 mg by mouth daily.     eszopiclone 1 MG Tabs tablet  Commonly known as:  LUNESTA  Take 0.5 mg by mouth daily.     furosemide 20 MG tablet  Commonly known as:  LASIX  Take 1 tablet (20 mg total) by mouth daily.     losartan 50 MG tablet  Commonly known as:  COZAAR  Take 1 tablet (50 mg total) by mouth daily.     rosuvastatin 10 MG tablet  Commonly known as:  CRESTOR  Take 1  tablet (10 mg total) by mouth daily.         Allergies  Allergen Reactions  . Demerol [Meperidine] Nausea Only  . Lipitor [Atorvastatin] Itching  . Lisinopril Cough  . Meperidine Hcl Nausea Only  . Plavix [Clopidogrel Bisulfate]     Rash   . Dexilant [Dexlansoprazole] Rash  . Latex Rash  . Sulfonamide Derivatives Rash  . Ticagrelor Itching and Rash    Past Medical History  Diagnosis Date  . Hypertension   . Hyperlipidemia   . Malaise and fatigue   . Insomnia   . Family history of colon cancer   . Family history of cardiovascular disease   . History of long-term treatment with high-risk medication   . Sinus headache   . Alopecia   . CAD (coronary artery disease)     a. 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 Cyndia Roy   . Ischemic cardiomyopathy     a. 03/2014  Echo: EF 25-30%, mid-apical/anteroseptal and inf AK, Gr 2 DD, ? apical thrombus, Mild MR.-->Discharged with LifeVest.  . Chronic systolic CHF (congestive heart failure)     a. 03/2014 Echo: EF 25-30%; Pre op TEE 05/2014 with EF of 50%  . Myocardial infarction 03/2014  . Shortness of breath dyspnea     Past Surgical History  Procedure Laterality Date  . Cardiac catheterization  2001    showed no blockages  . Cystectomy  1998    fibroid cysts  . 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 Cervical spine surgery  09/27/2003    C6-67 servical fusion  . Dilation and curettage of uterus  1976    2nd to miscarriage  . Cataract extraction w/phaco Left 01/04/2014    Procedure: CATARACT EXTRACTION PHACO AND INTRAOCULAR LENS PLACEMENT (IOC);  Surgeon: Kelli Branch, MD;  Location: [facility];  Service: Ophthalmology;  Laterality: Left;  CDE:5.65  . Cataract extraction w/phaco  Right 01/29/2014    Procedure: CATARACT EXTRACTION PHACO AND INTRAOCULAR LENS PLACEMENT RIGHT EYE CDE=4.49;  Surgeon: Kelli Branch, MD;  Location: [facility];  Service: Ophthalmology;  Laterality: Right;  . Eye surgery    . Coronary artery bypass graft N/A 05/21/2014    Procedure: CORONARY ARTERY BYPASS GRAFTING (CABG);  Surgeon: Kelli Pollack, MD;  Location: [place];  Service: Open Heart Surgery;  Laterality: N/A;  . Intraoperative transesophageal echocardiogram N/A 05/21/2014    Procedure: INTRAOPERATIVE TRANSESOPHAGEAL ECHOCARDIOGRAM;  Surgeon: Kelli Pollack, MD;  Location: [facility] OR;  Service: Open Heart Surgery;  Laterality: N/A;  . Left heart catheterization with coronary angiogram N/A 03/08/2014    Procedure: LEFT HEART CATHETERIZATION WITH CORONARY ANGIOGRAM;  Surgeon: Kelli M Martinique, MD;  Location: [facility] CATH LAB;  Service: Cardiovascular;  Laterality: N/A;  . Percutaneous coronary stent intervention (pci-s)  03/08/2014    Procedure: PERCUTANEOUS CORONARY STENT INTERVENTION (PCI-S);  Surgeon: Kelli M Martinique, MD;  Location: [facility] CATH LAB;  Service: Cardiovascular;;  prox lad  bms    History   Social History  . Marital Status: Married    Spouse Name: N/A  . Number of Children: N/A  . Years of Education: N/A   Social History Main Topics  . Smoking status: Never Smoker   . Smokeless tobacco: Never Used  . Alcohol Use: Yes     Comment: rare glass of wine  . Drug Use: No  . Sexual Activity: Yes    Birth Control/ Protection: Post-menopausal   Other Topics Concern  . None   Social History Narrative    Family History  Problem Relation Age of Onset  . Stroke Mother   . Heart attack Mother   . Peripheral vascular disease Father     had stent in leg  . Colon cancer Father   . Atrial fibrillation Father     with pacemaker  . Thyroid disease Father   . Pancreatic cancer Brother   . Coronary artery disease Brother     with Stent  . Coronary artery disease Brother     with CABG in his 49's  .  Thyroid disease Brother   . Thyroid disease Sister     Review of Systems: As noted in HPI.  All other systems were reviewed and are negative.  Physical Exam: BP 120/56 mmHg  Pulse 50  Ht 5\' 5"  (1.651 m)  Wt 62.415 kg (137 lb 9.6 oz)  BMI 22.90 kg/m2 [employer]  12/10/14 1608  Weight: 62.415 kg (137 lb 9.6 oz)  GENERAL:  Well appearing WF in NAD HEENT:  PERRL, EOMI, sclera are clear. Oropharynx is clear. NECK:  No jugular venous distention, carotid upstroke brisk and symmetric, no bruits, no thyromegaly or adenopathy LUNGS:  Clear to auscultation bilaterally CHEST:  Unremarkable, sternotomy scar has healed well. HEART:  RRR,  PMI not displaced or sustained,S1 and S2 within normal limits, no S3, no S4: no clicks, no rubs, no murmurs ABD:  Soft, nontender. BS +, no masses or bruits. No hepatomegaly, no splenomegaly EXT:  2 + pulses throughout, no edema, no cyanosis no clubbing SKIN:  Warm and dry.  No rashes NEURO:  Alert and oriented x 3. Cranial nerves II through XII intact. PSYCH:  Cognitively intact    LABORATORY DATA: Lab Results  Component Value Date   WBC 7.5 09/07/2014   HGB 14.7 09/07/2014   HCT 44.1 09/07/2014   PLT 180 09/07/2014   GLUCOSE 84 09/19/2014   CHOL 150 09/21/2014   TRIG 79 09/21/2014   HDL 58 09/21/2014   LDLCALC 76 09/21/2014   ALT 10 09/17/2014   AST 16 09/17/2014   NA 139 09/19/2014   K 4.9 09/19/2014   CL 102 09/19/2014   CREATININE 0.78 09/19/2014   BUN 19 09/19/2014   CO2 29 09/19/2014   INR 1.46 05/21/2014   HGBA1C 6.1* 05/17/2014     Assessment / Plan: 1. CAD s/p anterior STEMI in Sept. 2015 treated with BMS of proximal LAD. S/p CABG November 2015. Good clinical recovery. Continue cardiac Rehab.  Continue ASA only at this point due to rash on Plavix and Brilinta.  2. Ischemic cardiomyopathy with chronic systolic CHF. EF 35-40%. Well compensated. On lasix and losartan. Coreg and aldactone held due to hypotension. I don't think  she will tolerate beta blocker due to resting bradycardia. We will increase losartan to 50 mg daily.   3. Hyperlipidemia. Intolerance to high dose lipitor due to transaminitis. Tolerating Crestor well with good response.   .   I will follow up in 6 months.

## 2014-12-16 ENCOUNTER — Telehealth: Payer: Self-pay | Admitting: Cardiology

## 2014-12-16 ENCOUNTER — Encounter (HOSPITAL_COMMUNITY): Payer: Self-pay | Admitting: Emergency Medicine

## 2014-12-16 ENCOUNTER — Emergency Department (HOSPITAL_COMMUNITY)
Admission: EM | Admit: 2014-12-16 | Discharge: 2014-12-16 | Disposition: A | Discharge status: Home / Self Care | Payer: Medicare Other | Attending: Emergency Medicine | Admitting: Emergency Medicine

## 2014-12-16 ENCOUNTER — Emergency Department (HOSPITAL_COMMUNITY): Payer: Medicare Other

## 2014-12-16 DIAGNOSIS — I509 Heart failure, unspecified: Secondary | ICD-10-CM

## 2014-12-16 DIAGNOSIS — Z91040 Latex allergy status: Secondary | ICD-10-CM | POA: Diagnosis not present

## 2014-12-16 DIAGNOSIS — I2510 Atherosclerotic heart disease of native coronary artery without angina pectoris: Secondary | ICD-10-CM | POA: Insufficient documentation

## 2014-12-16 DIAGNOSIS — I252 Old myocardial infarction: Secondary | ICD-10-CM | POA: Insufficient documentation

## 2014-12-16 DIAGNOSIS — I5043 Acute on chronic combined systolic (congestive) and diastolic (congestive) heart failure: Secondary | ICD-10-CM | POA: Diagnosis not present

## 2014-12-16 DIAGNOSIS — Z7982 Long term (current) use of aspirin: Secondary | ICD-10-CM | POA: Insufficient documentation

## 2014-12-16 DIAGNOSIS — R0602 Shortness of breath: Secondary | ICD-10-CM | POA: Diagnosis present

## 2014-12-16 DIAGNOSIS — I10 Essential (primary) hypertension: Secondary | ICD-10-CM | POA: Diagnosis not present

## 2014-12-16 DIAGNOSIS — E785 Hyperlipidemia, unspecified: Secondary | ICD-10-CM | POA: Diagnosis not present

## 2014-12-16 DIAGNOSIS — Z951 Presence of aortocoronary bypass graft: Secondary | ICD-10-CM | POA: Insufficient documentation

## 2014-12-16 DIAGNOSIS — Z8669 Personal history of other diseases of the nervous system and sense organs: Secondary | ICD-10-CM | POA: Diagnosis not present

## 2014-12-16 DIAGNOSIS — I5022 Chronic systolic (congestive) heart failure: Secondary | ICD-10-CM | POA: Insufficient documentation

## 2014-12-16 DIAGNOSIS — Z872 Personal history of diseases of the skin and subcutaneous tissue: Secondary | ICD-10-CM | POA: Diagnosis not present

## 2014-12-16 DIAGNOSIS — Z79899 Other long term (current) drug therapy: Secondary | ICD-10-CM | POA: Insufficient documentation

## 2014-12-16 LAB — COMPREHENSIVE METABOLIC PANEL
ALT: 37 U/L (ref 14–54)
AST: 44 U/L — AB (ref 15–41)
Albumin: 3.8 g/dL (ref 3.5–5.0)
Alkaline Phosphatase: 105 U/L (ref 38–126)
Anion gap: 9 (ref 5–15)
BUN: 9 mg/dL (ref 6–20)
CHLORIDE: 107 mmol/L (ref 101–111)
CO2: 25 mmol/L (ref 22–32)
Calcium: 9.5 mg/dL (ref 8.9–10.3)
Creatinine, Ser: 0.72 mg/dL (ref 0.44–1.00)
GFR calc Af Amer: >60 mL/min (ref >60)
GFR calc non Af Amer: >60 mL/min (ref >60)
Glucose, Bld: 125 mg/dL — ABNORMAL HIGH (ref 65–99)
Potassium: 3.6 mmol/L (ref 3.5–5.1)
Sodium: 141 mmol/L (ref 135–145)
Total Bilirubin: 0.7 mg/dL (ref 0.3–1.2)
Total Protein: 6.5 g/dL (ref 6.5–8.1)

## 2014-12-16 LAB — URINALYSIS, ROUTINE W REFLEX MICROSCOPIC
BILIRUBIN URINE: NEGATIVE
GLUCOSE, UA: NEGATIVE mg/dL
HGB URINE DIPSTICK: NEGATIVE
KETONES UR: NEGATIVE mg/dL
LEUKOCYTES UA: NEGATIVE
Nitrite: NEGATIVE
Protein, ur: NEGATIVE mg/dL
Specific Gravity, Urine: 1.004 — ABNORMAL LOW (ref 1.005–1.030)
Urobilinogen, UA: 0.2 mg/dL (ref 0.0–1.0)
pH: 6 (ref 5.0–8.0)

## 2014-12-16 LAB — I-STAT TROPONIN, ED: Troponin i, poc: 0.01 ng/mL (ref 0.00–0.08)

## 2014-12-16 LAB — BRAIN NATRIURETIC PEPTIDE: B Natriuretic Peptide: 1105.7 pg/mL — ABNORMAL HIGH (ref 0.0–100.0)

## 2014-12-16 NOTE — Discharge Instructions (Signed)
Take 40mg  of lasix for the next 3-4 days and then go back down to 20mg  and see how you feel.  Return here for worsening shortness of breath or chest pain. Heart Failure Heart failure is a condition in which the heart has trouble pumping blood. This means your heart does not pump blood efficiently for your body to work well. In some cases of heart failure, fluid may back up into your lungs or you may have swelling (edema) in your lower legs. Heart failure is usually a long-term (chronic) condition. It is important for you to take good care of yourself and follow your health care provider's treatment plan. CAUSES  Some health conditions can cause heart failure. Those health conditions include:  High blood pressure (hypertension). Hypertension causes the heart muscle to work harder than normal. When pressure in the blood vessels is high, the heart needs to pump (contract) with more force in order to circulate blood throughout the body. High blood pressure eventually causes the heart to become stiff and weak.  Coronary artery disease (CAD). CAD is the buildup of cholesterol and fat (plaque) in the arteries of the heart. The blockage in the arteries deprives the heart muscle of oxygen and blood. This can cause chest pain and may lead to a heart attack. High blood pressure can also contribute to CAD.  Heart attack (myocardial infarction). A heart attack occurs when one or more arteries in the heart become blocked. The loss of oxygen damages the muscle tissue of the heart. When this happens, part of the heart muscle dies. The injured tissue does not contract as well and weakens the heart's ability to pump blood.  Abnormal heart valves. When the heart valves do not open and close properly, it can cause heart failure. This makes the heart muscle pump harder to keep the blood flowing.  Heart muscle disease (cardiomyopathy or myocarditis). Heart muscle disease is damage to the heart muscle from a variety of  causes. These can include drug or alcohol abuse, infections, or unknown reasons. These can increase the risk of heart failure.  Lung disease. Lung disease makes the heart work harder because the lungs do not work properly. This can cause a strain on the heart, leading it to fail.  Diabetes. Diabetes increases the risk of heart failure. High blood sugar contributes to high fat (lipid) levels in the blood. Diabetes can also cause slow damage to tiny blood vessels that carry important nutrients to the heart muscle. When the heart does not get enough oxygen and food, it can cause the heart to become weak and stiff. This leads to a heart that does not contract efficiently.  Other conditions can contribute to heart failure. These include abnormal heart rhythms, thyroid problems, and low blood counts (anemia). Certain unhealthy behaviors can increase the risk of heart failure, including:  Being overweight.  Smoking or chewing tobacco.  Eating foods high in fat and cholesterol.  Abusing illicit drugs or alcohol.  Lacking physical activity. SYMPTOMS  Heart failure symptoms may vary and can be hard to detect. Symptoms may include:  Shortness of breath with activity, such as climbing stairs.  Persistent cough.  Swelling of the feet, ankles, legs, or abdomen.  Unexplained weight gain.  Difficulty breathing when lying flat (orthopnea).  Waking from sleep because of the need to sit up and get more air.  Rapid heartbeat.  Fatigue and loss of energy.  Feeling light-headed, dizzy, or close to fainting.  Loss of appetite.  Nausea.  Increased urination during the night (nocturia). DIAGNOSIS  A diagnosis of heart failure is based on your history, symptoms, physical examination, and diagnostic tests. Diagnostic tests for heart failure may include:  Echocardiography.  Electrocardiography.  Chest X-ray.  Blood tests.  Exercise stress test.  Cardiac angiography.  Radionuclide  scans. TREATMENT  Treatment is aimed at managing the symptoms of heart failure. Medicines, behavioral changes, or surgical intervention may be necessary to treat heart failure.  Medicines to help treat heart failure may include:  Angiotensin-converting enzyme (ACE) inhibitors. This type of medicine blocks the effects of a blood protein called angiotensin-converting enzyme. ACE inhibitors relax (dilate) the blood vessels and help lower blood pressure.  Angiotensin receptor blockers (ARBs). This type of medicine blocks the actions of a blood protein called angiotensin. Angiotensin receptor blockers dilate the blood vessels and help lower blood pressure.  Water pills (diuretics). Diuretics cause the kidneys to remove salt and water from the blood. The extra fluid is removed through urination. This loss of extra fluid lowers the volume of blood the heart pumps.  Beta blockers. These prevent the heart from beating too fast and improve heart muscle strength.  Digitalis. This increases the force of the heartbeat.  Healthy behavior changes include:  Obtaining and maintaining a healthy weight.  Stopping smoking or chewing tobacco.  Eating heart-healthy foods.  Limiting or avoiding alcohol.  Stopping illicit drug use.  Physical activity as directed by your health care provider.  Surgical treatment for heart failure may include:  A procedure to open blocked arteries, repair damaged heart valves, or remove damaged heart muscle tissue.  A pacemaker to improve heart muscle function and control certain abnormal heart rhythms.  An internal cardioverter defibrillator to treat certain serious abnormal heart rhythms.  A left ventricular assist device (LVAD) to assist the pumping ability of the heart. HOME CARE INSTRUCTIONS   Take medicines only as directed by your health care provider. Medicines are important in reducing the workload of your heart, slowing the progression of heart failure, and  improving your symptoms.  Do not stop taking your medicine unless directed by your health care provider.  Do not skip any dose of medicine.  Refill your prescriptions before you run out of medicine. Your medicines are needed every day.  Engage in moderate physical activity if directed by your health care provider. Moderate physical activity can benefit some people. The elderly and people with severe heart failure should consult with a health care provider for physical activity recommendations.  Eat heart-healthy foods. Food choices should be free of trans fat and low in saturated fat, cholesterol, and salt (sodium). Healthy choices include fresh or frozen fruits and vegetables, fish, lean meats, legumes, fat-free or low-fat dairy products, and whole grain or high fiber foods. Talk to a dietitian to learn more about heart-healthy foods.  Limit sodium if directed by your health care provider. Sodium restriction may reduce symptoms of heart failure in some people. Talk to a dietitian to learn more about heart-healthy seasonings.  Use healthy cooking methods. Healthy cooking methods include roasting, grilling, broiling, baking, poaching, steaming, or stir-frying. Talk to a dietitian to learn more about healthy cooking methods.  Limit fluids if directed by your health care provider. Fluid restriction may reduce symptoms of heart failure in some people.  Weigh yourself every day. Daily weights are important in the early recognition of excess fluid. You should weigh yourself every morning after you urinate and before you eat breakfast. Wear the same amount of  clothing each time you weigh yourself. Record your daily weight. Provide your health care provider with your weight record.  Monitor and record your blood pressure if directed by your health care provider.  Check your pulse if directed by your health care provider.  Lose weight if directed by your health care provider. Weight loss may reduce  symptoms of heart failure in some people.  Stop smoking or chewing tobacco. Nicotine makes your heart work harder by causing your blood vessels to constrict. Do not use nicotine gum or patches before talking to your health care provider.  Keep all follow-up visits as directed by your health care provider. This is important.  Limit alcohol intake to no more than 1 drink per day for nonpregnant women and 2 drinks per day for men. One drink equals 12 ounces of beer, 5 ounces of wine, or 1 ounces of hard liquor. Drinking more than that is harmful to your heart. Tell your health care provider if you drink alcohol several times a week. Talk with your health care provider about whether alcohol is safe for you. If your heart has already been damaged by alcohol or you have severe heart failure, drinking alcohol should be stopped completely.  Stop illicit drug use.  Stay up-to-date with immunizations. It is especially important to prevent respiratory infections through current pneumococcal and influenza immunizations.  Manage other health conditions such as hypertension, diabetes, thyroid disease, or abnormal heart rhythms as directed by your health care provider.  Learn to manage stress.  Plan rest periods when fatigued.  Learn strategies to manage high temperatures. If the weather is extremely hot:  Avoid vigorous physical activity.  Use air conditioning or fans or seek a cooler location.  Avoid caffeine and alcohol.  Wear loose-fitting, lightweight, and light-colored clothing.  Learn strategies to manage cold temperatures. If the weather is extremely cold:  Avoid vigorous physical activity.  Layer clothes.  Wear mittens or gloves, a hat, and a scarf when going outside.  Avoid alcohol.  Obtain ongoing education and support as needed.  Participate in or seek rehabilitation as needed to maintain or improve independence and quality of life. SEEK MEDICAL CARE IF:   Your weight  increases by 03 lb/1.4 kg in 1 day or 05 lb/2.3 kg in a week.  You have increasing shortness of breath that is unusual for you.  You are unable to participate in your usual physical activities.  You tire easily.  You cough more than normal, especially with physical activity.  You have any or more swelling in areas such as your hands, feet, ankles, or abdomen.  You are unable to sleep because it is hard to breathe.  You feel like your heart is beating fast (palpitations).  You become dizzy or light-headed upon standing up. SEEK IMMEDIATE MEDICAL CARE IF:   You have difficulty breathing.  There is a change in mental status such as decreased alertness or difficulty with concentration.  You have a pain or discomfort in your chest.  You have an episode of fainting (syncope). MAKE SURE YOU:   Understand these instructions.  Will watch your condition.  Will get help right away if you are not doing well or get worse. Document Released: 06/22/2005 Document Revised: 11/06/2013 Document Reviewed: 07/22/2012 [facility] Patient Information 2015 [place], [place]. This information is not intended to replace advice given to you by your health care provider. Make sure you discuss any questions you have with your health care provider.

## 2014-12-16 NOTE — ED Notes (Signed)
Pt. Stated, I started having some SOB and feeling like I was close to being SOB. I 've been in CHF beofore Stent in sept., heart surgery in Nov. 4 by-pass,

## 2014-12-16 NOTE — ED Provider Notes (Addendum)
CSN: [id number]     Arrival date & time 12/16/14  0844 History   First MD Initiated Contact with Patient 12/16/14 [phone]     Chief Complaint  Patient presents with  . Congestive Heart Failure    "fluid in lungs     (Consider location/radiation/quality/duration/timing/severity/associated sxs/prior Treatment) Patient is a 69 y.o. female presenting with CHF. The history is provided by the patient and the spouse.  Congestive Heart Failure This is a recurrent problem. The current episode started 3 to 5 hours ago. The problem occurs constantly. The problem has been gradually improving. Associated symptoms include shortness of breath. Pertinent negatives include no chest pain. Associated symptoms comments: Woke up this morning with slightly wet cough and mild SOB.  Pt spoke with Dr. Percival Spanish and took an additional lasix at 6:45 and since that time has urinated 4 times.  Now feeling better but still with a  Slightly wet cough. Nothing aggravates the symptoms. Relieved by: lasix. The treatment provided moderate relief.    Past Medical History  Diagnosis Date  . Hypertension   . Hyperlipidemia   . Malaise and fatigue   . Insomnia   . Family history of colon cancer   . Family history of cardiovascular disease   . History of long-term treatment with high-risk medication   . Sinus headache   . Alopecia   . CAD (coronary artery disease)     a. 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 Cyndia Bent   . Ischemic cardiomyopathy     a. 03/2014 Echo: EF 25-30%, mid-apical/anteroseptal and inf AK, Gr 2 DD, ? apical thrombus, Mild MR.-->Discharged with LifeVest.  . Chronic systolic CHF (congestive heart failure)     a. 03/2014 Echo: EF 25-30%; Pre op TEE 05/2014 with EF of 50%  . Myocardial infarction  03/2014  . Shortness of breath dyspnea    Past Surgical History  Procedure Laterality Date  . Cardiac catheterization  2001    showed no blockages  . Cystectomy  1998    fibroid cysts  . 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 Cervical spine surgery  09/27/2003    C6-67 servical fusion  . Dilation and curettage of uterus  1976    2nd to miscarriage  . Cataract extraction w/phaco Left 01/04/2014    Procedure: CATARACT EXTRACTION PHACO AND INTRAOCULAR LENS PLACEMENT (IOC);  Surgeon: Tonny Branch, MD;  Location: [facility];  Service: Ophthalmology;  Laterality: Left;  CDE:5.65  . Cataract extraction w/phaco Right 01/29/2014    Procedure: CATARACT EXTRACTION PHACO AND INTRAOCULAR LENS PLACEMENT RIGHT EYE CDE=4.49;  Surgeon: Tonny Branch, MD;  Location: [facility];  Service: Ophthalmology;  Laterality: Right;  . Eye surgery    . Coronary artery bypass graft N/A 05/21/2014    Procedure: CORONARY ARTERY BYPASS GRAFTING (CABG);  Surgeon: Gaye Pollack, MD;  Location: [place];  Service: Open Heart Surgery;  Laterality: N/A;  . Intraoperative transesophageal echocardiogram N/A 05/21/2014    Procedure: INTRAOPERATIVE TRANSESOPHAGEAL ECHOCARDIOGRAM;  Surgeon: Gaye Pollack, MD;  Location: [facility] OR;  Service: Open Heart Surgery;  Laterality: N/A;  . Left heart catheterization with coronary angiogram N/A 03/08/2014    Procedure: LEFT HEART CATHETERIZATION WITH  CORONARY ANGIOGRAM;  Surgeon: Peter M Martinique, MD;  Location: [facility] CATH LAB;  Service: Cardiovascular;  Laterality: N/A;  . Percutaneous coronary stent intervention (pci-s)  03/08/2014    Procedure: PERCUTANEOUS CORONARY STENT INTERVENTION (PCI-S);  Surgeon: Peter M Martinique, MD;  Location: [facility] CATH LAB;  Service: Cardiovascular;;  prox lad  bms   Family History  Problem Relation Age of Onset  . Stroke Mother   . Heart attack Mother   . Peripheral vascular disease Father     had stent in leg  . Colon cancer Father   . Atrial  fibrillation Father     with pacemaker  . Thyroid disease Father   . Pancreatic cancer Brother   . Coronary artery disease Brother     with Stent  . Coronary artery disease Brother     with CABG in his 80's  . Thyroid disease Brother   . Thyroid disease Sister    History  Substance Use Topics  . Smoking status: Never Smoker   . Smokeless tobacco: Never Used  . Alcohol Use: Yes     Comment: rare glass of wine   OB History    No data available     Review of Systems  Constitutional:       2-3lb weight gain over the last few days  Respiratory: Positive for shortness of breath.   Cardiovascular: Negative for chest pain.  All other systems reviewed and are negative.     Allergies  Demerol; Lipitor; Lisinopril; Meperidine hcl; Plavix; Dexilant; Latex; Sulfonamide derivatives; and Ticagrelor  Home Medications   Prior to Admission medications   Medication Sig Start Date End Date Taking? Authorizing Provider  acetaminophen (TYLENOL) 500 MG tablet Take 500 mg by mouth every 6 (six) hours as needed.    Historical Provider, MD  aspirin EC 81 MG tablet Take 81 mg by mouth daily.    Historical Provider, MD  eszopiclone (LUNESTA) 1 MG TABS tablet Take 0.5 mg by mouth daily.  09/04/14   Historical Provider, MD  furosemide (LASIX) 20 MG tablet Take 1 tablet (20 mg total) by mouth daily. 09/26/14   Brittainy Erie Noe, PA-C  losartan (COZAAR) 50 MG tablet Take 1 tablet (50 mg total) by mouth daily. 12/10/14   Peter M Martinique, MD  rosuvastatin (CRESTOR) 10 MG tablet Take 1 tablet (10 mg total) by mouth daily. 10/23/14   Peter M Martinique, MD   BP 161/64 mmHg  Pulse 55  Temp(Src) 97.9 F (36.6 C) (Oral)  Resp 17  Ht 5\' 5"  (1.651 m)  Wt 137 lb 8 oz (62.37 kg)  BMI 22.88 kg/m2  SpO2 95% Physical Exam  Constitutional: She is oriented to person, place, and time. She appears well-developed and well-nourished. No distress.  HENT:  Head: Normocephalic and atraumatic.  Mouth/Throat: Oropharynx is  clear and moist.  Eyes: Conjunctivae and EOM are normal. Pupils are equal, round, and reactive to light.  Neck: Normal range of motion. Neck supple.  Cardiovascular: Normal rate, regular rhythm and intact distal pulses.   No murmur heard. Pulmonary/Chest: Effort normal and breath sounds normal. No respiratory distress. She has no wheezes. She has no rales.  Abdominal: Soft. She exhibits no distension. There is no tenderness. There is no rebound and no guarding.  Musculoskeletal: Normal range of motion. She exhibits edema. She exhibits no tenderness.  Trace edema in bilateral lower ext  Neurological: She is alert and oriented to person, place, and time.  Skin: Skin is warm and dry.  No rash noted. No erythema.  Psychiatric: She has a normal mood and affect. Her behavior is normal.  Nursing note and vitals reviewed.   ED Course  Procedures (including critical care time) Labs Review Labs Reviewed  COMPREHENSIVE METABOLIC PANEL - Abnormal; Notable for the following:    Glucose, Bld 125 (*)    AST 44 (*)    All other components within normal limits  BRAIN NATRIURETIC PEPTIDE - Abnormal; Notable for the following:    B Natriuretic Peptide 1105.7 (*)    All other components within normal limits  URINALYSIS, ROUTINE W REFLEX MICROSCOPIC (NOT AT [facility]) - Abnormal; Notable for the following:    Specific Gravity, Urine 1.004 (*)    All other components within normal limits  I-STAT TROPOININ, ED    Imaging Review Dg Chest 2 View  12/16/2014   CLINICAL DATA:  Rattling in nose and chest today with nausea and shortness of breath. History of congestive heart failure and CABG. Initial encounter.  EXAM: CHEST  2 VIEW  COMPARISON:  09/08/2014 and 09/07/2014.  FINDINGS: The heart size and mediastinal contours are stable status post CABG. There is recurrent interstitial pulmonary edema which is not as significant as on the prior examination. There is no significant pleural effusion. There is questionable  new nodular density in the right suprahilar region on the frontal examination, superimposed over the posterior aspect of the right sixth rib. There is no definite corresponding finding on the lateral view. This could be due to an overlap of vascular and osseous structures. The bones appear unchanged status post cervical fusion.  IMPRESSION: 1. Recurrent interstitial pulmonary edema. No significant pleural effusion. 2. Questionable new nodular density in the right suprahilar region, possibly due to overlap of normal structures. Attention on follow-up radiographs after the edema has resolved recommended to exclude developing pulmonary nodule.   Electronically Signed   By: Richardean Sale M.D.   On: 12/16/2014 09:43     EKG Interpretation   Date/Time:  Sunday December 16 2014 08:54:46 EDT Ventricular Rate:  60 PR Interval:  138 QRS Duration: 86 QT Interval:  412 QTC Calculation: 412 R Axis:   87 Text Interpretation:  Normal sinus rhythm Normal ECG frequent PVCs  resolved from last tracing. Confirmed by Maryan Rued  MD, Loree Fee ([id number]) on  12/16/2014 9:12:32 AM      MDM   Final diagnoses:  Acute on chronic congestive heart failure, unspecified congestive heart failure type    Pt with history of CHF status post CABG who presents today with a one-day history of feeling slightly short of breath with waking up this morning. Patient had a significant history 2 months ago of CHF requiring admission and today when she woke up she felt that it was the start of similar symptoms. Over the last 2 weeks she has not been diligent about her salt intake but has been taking her medications as prescribed. This morning she took an additional Lasix came for evaluation anyway. After taking a total of 40 mg of Lasix prior to arriving at the emergency room she urinated 4 times. She still complains of a slightly wet cough and her weight is approximately 2-3 pounds greater than what it normally is at home. She denies any chest  pain, orthopnea, lower extremity edema, abdominal pain or vomiting.  EKG unremarkable. Feel that this is most likely CHF exacerbation however may be amenable to outpatient treatment. We'll continue to follow patient as she is RA taken 40 of Lasix to see  how she feels another 1-2 hours. CBC, CMP, BNP, troponin, chest x-ray pending  11:41 AM Chest x-ray showing mild interstitial infiltrate without pleural effusions. BNP is similar to prior. Creatinine is within normal limits. On reevaluation patient has urinated multiple more times and feels much better. Patient was ambulated and she had no shortness of breath. We'll discharge patient to continue 40 mg of Lasix over the next few days and follow-up with her cardiologist  Blanchie Dessert, MD 12/16/14 [place], MD 12/16/14 1156

## 2014-12-16 NOTE — Telephone Encounter (Signed)
Patient called with some SOB.  BP is up slightly.  She took her Cozaar early and is waiting for the BP to come down.  She will take an extra 20 mg of Lasix.  However, if her breathing and BP do not improve quickly she will go to [facility] ED.

## 2014-12-16 NOTE — ED Notes (Signed)
MD Plunkett at the bedside  

## 2014-12-17 ENCOUNTER — Telehealth: Payer: Self-pay | Admitting: Cardiology

## 2014-12-17 ENCOUNTER — Encounter (HOSPITAL_COMMUNITY): Payer: Self-pay

## 2014-12-17 NOTE — Telephone Encounter (Signed)
Pt went to the ER yesterday and they her to contact Dr Martinique this morning. He can review what transpires yesterday and let her know if sh needs to be seen.She was suppose to start Cardiac Maintenance this morning,but they suggested she wait awhile.

## 2014-12-17 NOTE — Telephone Encounter (Signed)
It is fine for her to take extra lasix for a few days 3-4. She may have an anxiety component. She will need to see primary care for treatment of this. Lets give it a while to see how she does. When she gets hooked in with Cardiac Rehab we will have some assessment there.  Glenda Kunst Martinique MD, [facility], [place]

## 2014-12-17 NOTE — Telephone Encounter (Signed)
Returned call to patient she stated she went to [facility] ER yesterday.Stated when she woke up yesterday she did not urinate as much.Stated she was nervous, shaky,weak,nausea a little sob.Stated she was told to double up on lasix for a few days.Stated she feels good this morning.Stated she wanted to ask Dr.Jordan if she needs to be seen before Sept.and if ok to start cardiac rehab this week.Also she wanted to know if she needs anxiety medication.Stated every time she feels bad she gets very nervous.Message sent to [place] for advice.

## 2014-12-17 NOTE — Telephone Encounter (Signed)
Returned call to patient no answer.LMTC. 

## 2014-12-18 NOTE — Telephone Encounter (Signed)
Returned call to patient Dr.Jordan advised ok to take extra lasix for 3 to 4 days.Advised to see PCP about anxiety.Ok to start cardiac rehab.Advised to call back if needed.

## 2014-12-19 ENCOUNTER — Encounter (HOSPITAL_COMMUNITY)
Admission: RE | Admit: 2014-12-19 | Discharge: 2014-12-19 | Disposition: A | Discharge status: Home / Self Care | Payer: Self-pay | Source: Ambulatory Visit | Attending: Cardiology | Admitting: Cardiology

## 2014-12-19 DIAGNOSIS — I252 Old myocardial infarction: Secondary | ICD-10-CM | POA: Insufficient documentation

## 2014-12-19 DIAGNOSIS — Z951 Presence of aortocoronary bypass graft: Secondary | ICD-10-CM | POA: Insufficient documentation

## 2014-12-19 DIAGNOSIS — I10 Essential (primary) hypertension: Secondary | ICD-10-CM | POA: Insufficient documentation

## 2014-12-21 ENCOUNTER — Encounter (HOSPITAL_COMMUNITY)
Admission: RE | Admit: 2014-12-21 | Discharge: 2014-12-21 | Disposition: A | Discharge status: Home / Self Care | Payer: Self-pay | Source: Ambulatory Visit | Attending: Cardiology | Admitting: Cardiology

## 2014-12-24 ENCOUNTER — Encounter (HOSPITAL_COMMUNITY)
Admission: RE | Admit: 2014-12-24 | Discharge: 2014-12-24 | Disposition: A | Discharge status: Home / Self Care | Payer: Self-pay | Source: Ambulatory Visit | Attending: Cardiology | Admitting: Cardiology

## 2014-12-26 ENCOUNTER — Encounter (HOSPITAL_COMMUNITY)
Admission: RE | Admit: 2014-12-26 | Discharge: 2014-12-26 | Disposition: A | Discharge status: Home / Self Care | Payer: Self-pay | Source: Ambulatory Visit | Attending: Cardiology | Admitting: Cardiology

## 2014-12-28 ENCOUNTER — Encounter (HOSPITAL_COMMUNITY)
Admission: RE | Admit: 2014-12-28 | Discharge: 2014-12-28 | Disposition: A | Discharge status: Home / Self Care | Payer: Self-pay | Source: Ambulatory Visit | Attending: Cardiology | Admitting: Cardiology

## 2014-12-31 ENCOUNTER — Encounter (HOSPITAL_COMMUNITY): Payer: Self-pay

## 2015-01-02 ENCOUNTER — Encounter (HOSPITAL_COMMUNITY)
Admission: RE | Admit: 2015-01-02 | Discharge: 2015-01-02 | Disposition: A | Discharge status: Home / Self Care | Payer: Self-pay | Source: Ambulatory Visit | Attending: Cardiology | Admitting: Cardiology

## 2015-01-04 ENCOUNTER — Encounter (HOSPITAL_COMMUNITY): Payer: Self-pay

## 2015-01-07 ENCOUNTER — Encounter (HOSPITAL_COMMUNITY): Payer: Self-pay

## 2015-01-09 ENCOUNTER — Encounter (HOSPITAL_COMMUNITY): Payer: Self-pay

## 2015-01-11 ENCOUNTER — Encounter (HOSPITAL_COMMUNITY): Payer: Self-pay

## 2015-01-14 ENCOUNTER — Encounter (HOSPITAL_COMMUNITY): Payer: Self-pay

## 2015-01-16 ENCOUNTER — Encounter (HOSPITAL_COMMUNITY): Payer: Self-pay

## 2015-01-18 ENCOUNTER — Encounter (HOSPITAL_COMMUNITY): Payer: Self-pay

## 2015-01-21 ENCOUNTER — Encounter (HOSPITAL_COMMUNITY): Payer: Self-pay

## 2015-01-23 ENCOUNTER — Encounter (HOSPITAL_COMMUNITY): Payer: Self-pay

## 2015-01-25 ENCOUNTER — Encounter (HOSPITAL_COMMUNITY): Payer: Self-pay

## 2015-01-28 ENCOUNTER — Encounter (HOSPITAL_COMMUNITY): Payer: Self-pay

## 2015-01-30 ENCOUNTER — Encounter (HOSPITAL_COMMUNITY): Payer: Self-pay

## 2015-02-01 ENCOUNTER — Encounter (HOSPITAL_COMMUNITY): Payer: Self-pay

## 2015-02-04 ENCOUNTER — Encounter (HOSPITAL_COMMUNITY): Payer: Self-pay

## 2015-02-06 ENCOUNTER — Encounter (HOSPITAL_COMMUNITY): Payer: Self-pay

## 2015-02-08 ENCOUNTER — Encounter (HOSPITAL_COMMUNITY): Payer: Self-pay

## 2015-02-11 ENCOUNTER — Encounter (HOSPITAL_COMMUNITY): Payer: Self-pay

## 2015-02-13 ENCOUNTER — Encounter: Payer: Self-pay | Admitting: Family Medicine

## 2015-02-13 ENCOUNTER — Encounter (HOSPITAL_COMMUNITY): Payer: Self-pay

## 2015-02-15 ENCOUNTER — Encounter (HOSPITAL_COMMUNITY): Payer: Self-pay

## 2015-02-18 ENCOUNTER — Encounter (HOSPITAL_COMMUNITY): Payer: Self-pay

## 2015-02-19 DIAGNOSIS — F419 Anxiety disorder, unspecified: Secondary | ICD-10-CM | POA: Diagnosis not present

## 2015-02-19 DIAGNOSIS — Z1389 Encounter for screening for other disorder: Secondary | ICD-10-CM | POA: Diagnosis not present

## 2015-02-19 DIAGNOSIS — Z01419 Encounter for gynecological examination (general) (routine) without abnormal findings: Secondary | ICD-10-CM | POA: Diagnosis not present

## 2015-02-19 DIAGNOSIS — Z124 Encounter for screening for malignant neoplasm of cervix: Secondary | ICD-10-CM | POA: Diagnosis not present

## 2015-02-19 DIAGNOSIS — Z6822 Body mass index (BMI) 22.0-22.9, adult: Secondary | ICD-10-CM | POA: Diagnosis not present

## 2015-02-20 ENCOUNTER — Encounter (HOSPITAL_COMMUNITY): Payer: Self-pay

## 2015-02-22 ENCOUNTER — Encounter (HOSPITAL_COMMUNITY): Payer: Self-pay

## 2015-02-25 ENCOUNTER — Encounter (HOSPITAL_COMMUNITY): Payer: Self-pay

## 2015-02-26 ENCOUNTER — Ambulatory Visit (INDEPENDENT_AMBULATORY_CARE_PROVIDER_SITE_OTHER): Payer: Medicare Other | Admitting: Family Medicine

## 2015-02-26 ENCOUNTER — Encounter: Payer: Self-pay | Admitting: Family Medicine

## 2015-02-26 VITALS — BP 130/76 | HR 58 | Temp 97.8°F | Resp 12 | Ht 65.0 in | Wt 134.0 lb

## 2015-02-26 DIAGNOSIS — E785 Hyperlipidemia, unspecified: Secondary | ICD-10-CM | POA: Diagnosis not present

## 2015-02-26 DIAGNOSIS — I5022 Chronic systolic (congestive) heart failure: Secondary | ICD-10-CM | POA: Diagnosis not present

## 2015-02-26 DIAGNOSIS — I10 Essential (primary) hypertension: Secondary | ICD-10-CM | POA: Diagnosis not present

## 2015-02-26 DIAGNOSIS — G4700 Insomnia, unspecified: Secondary | ICD-10-CM | POA: Diagnosis not present

## 2015-02-26 MED ORDER — ESZOPICLONE 1 MG PO TABS: 0.5000 mg | ORAL_TABLET | Freq: Every day | ORAL | Status: DC

## 2015-02-26 NOTE — Assessment & Plan Note (Signed)
Blood pressure well controlled at change of medication 

## 2015-02-26 NOTE — Patient Instructions (Addendum)
Release- Release of records- Dr. Jorja Loa (My eye doctor) Release of records- Dr. Wynetta Emery ( Gastroenterology in [place]) Dr. Paula Compton (OB/GYN) F/U 4 months

## 2015-02-26 NOTE — Assessment & Plan Note (Addendum)
She is currently compensated with Lasix  She will need Prevnar 13 and December

## 2015-02-26 NOTE — Assessment & Plan Note (Signed)
Recent lipids were normal she has follow with her cardiologist next month

## 2015-02-26 NOTE — Progress Notes (Signed)
Patient ID: Kelli Roy, female   DOB: [birth year], 69 y.o.   MRN: [id number]   Subjective:    Patient ID: Kelli Roy, female    DOB: Jul 07, 1945, 69 y.o.   MRN: [id number]  Patient presents for New Patient- Establish Care  here to establish care. She was in fairly good health until last year she had only had diagnoses of hyperlipidemia. She began having shortness of breath with exertion and then had a few episodes of chest pain she was diagnosed with coronary artery disease she had a stent placed in approximately a month later had bypass surgery done. She has not been able to tolerate beta blocker and high direct because of hypotension as well as borderline bradycardia. I reviewed her last cardiology note with her in detail as she did not understand some of the medications. She had transaminitis with Lipitor but has done well with Crestor and this has controlled her lipids as well. She feels like she can do most of her activities now without being significantly short of breath she is also monitoring her fluid levels and she is in cardiac rehabilitation.  Her gastroenterologist is Dr. Wynetta Emery she states that she is due for colonoscopy this year because of history of polyps her GYN is Dr. Paula Compton she is due first mammogram in September  Ophthalmologist is Dr. Jorja Loa  She has problems with constipation but is now using stool softeners which helped    Review Of Systems:  GEN- denies fatigue, fever, weight loss,weakness, recent illness HEENT- denies eye drainage, change in vision, nasal discharge, CVS- denies chest pain, palpitations RESP- denies SOB, cough, wheeze ABD- denies N/V, change in stools, abd pain GU- denies dysuria, hematuria, dribbling, incontinence MSK- denies joint pain, muscle aches, injury Neuro- denies headache, dizziness, syncope, seizure activity       Objective:    BP 130/76 mmHg  Pulse 58  Temp(Src) 97.8 F (36.6 C) (Oral)  Resp 12  Ht 5\' 5"  (1.651 m)   Wt 134 lb (60.782 kg)  BMI 22.30 kg/m2 GEN- NAD, alert and oriented x3 HEENT- PERRL, EOMI, non injected sclera, pink conjunctiva, MMM, oropharynx clear Neck- Supple, no thyromegaly CVS- RRR, no murmur RESP-CTAB ABD-NABS,soft,NT,ND Psych- normal affect and mood  EXT- No edema Pulses- Radial, DP- 2+        Assessment & Plan:      Problem List Items Addressed This Visit    Hyperlipidemia   Essential hypertension - Primary   Chronic systolic CHF (congestive heart failure)      Note: This dictation was prepared with Dragon dictation along with smaller phrase technology. Any transcriptional errors that result from this process are unintentional.

## 2015-02-26 NOTE — Assessment & Plan Note (Signed)
Continue Lunesta at current dose

## 2015-02-27 ENCOUNTER — Encounter (HOSPITAL_COMMUNITY): Payer: Self-pay

## 2015-03-18 ENCOUNTER — Encounter: Payer: Self-pay | Admitting: Family Medicine

## 2015-03-18 DIAGNOSIS — Z1231 Encounter for screening mammogram for malignant neoplasm of breast: Secondary | ICD-10-CM | POA: Diagnosis not present

## 2015-03-18 LAB — HM MAMMOGRAPHY: HM Mammogram: NEGATIVE

## 2015-03-22 ENCOUNTER — Encounter: Payer: Self-pay | Admitting: Family Medicine

## 2015-03-23 IMAGING — CR DG CHEST 1V PORT
1 series · 1 of 1 positions shown · non-contrast
Comparison: Portable chest x-ray May 21, 2014

CLINICAL DATA: Status post CABG

EXAM:
PORTABLE CHEST - 1 VIEW

[AP]
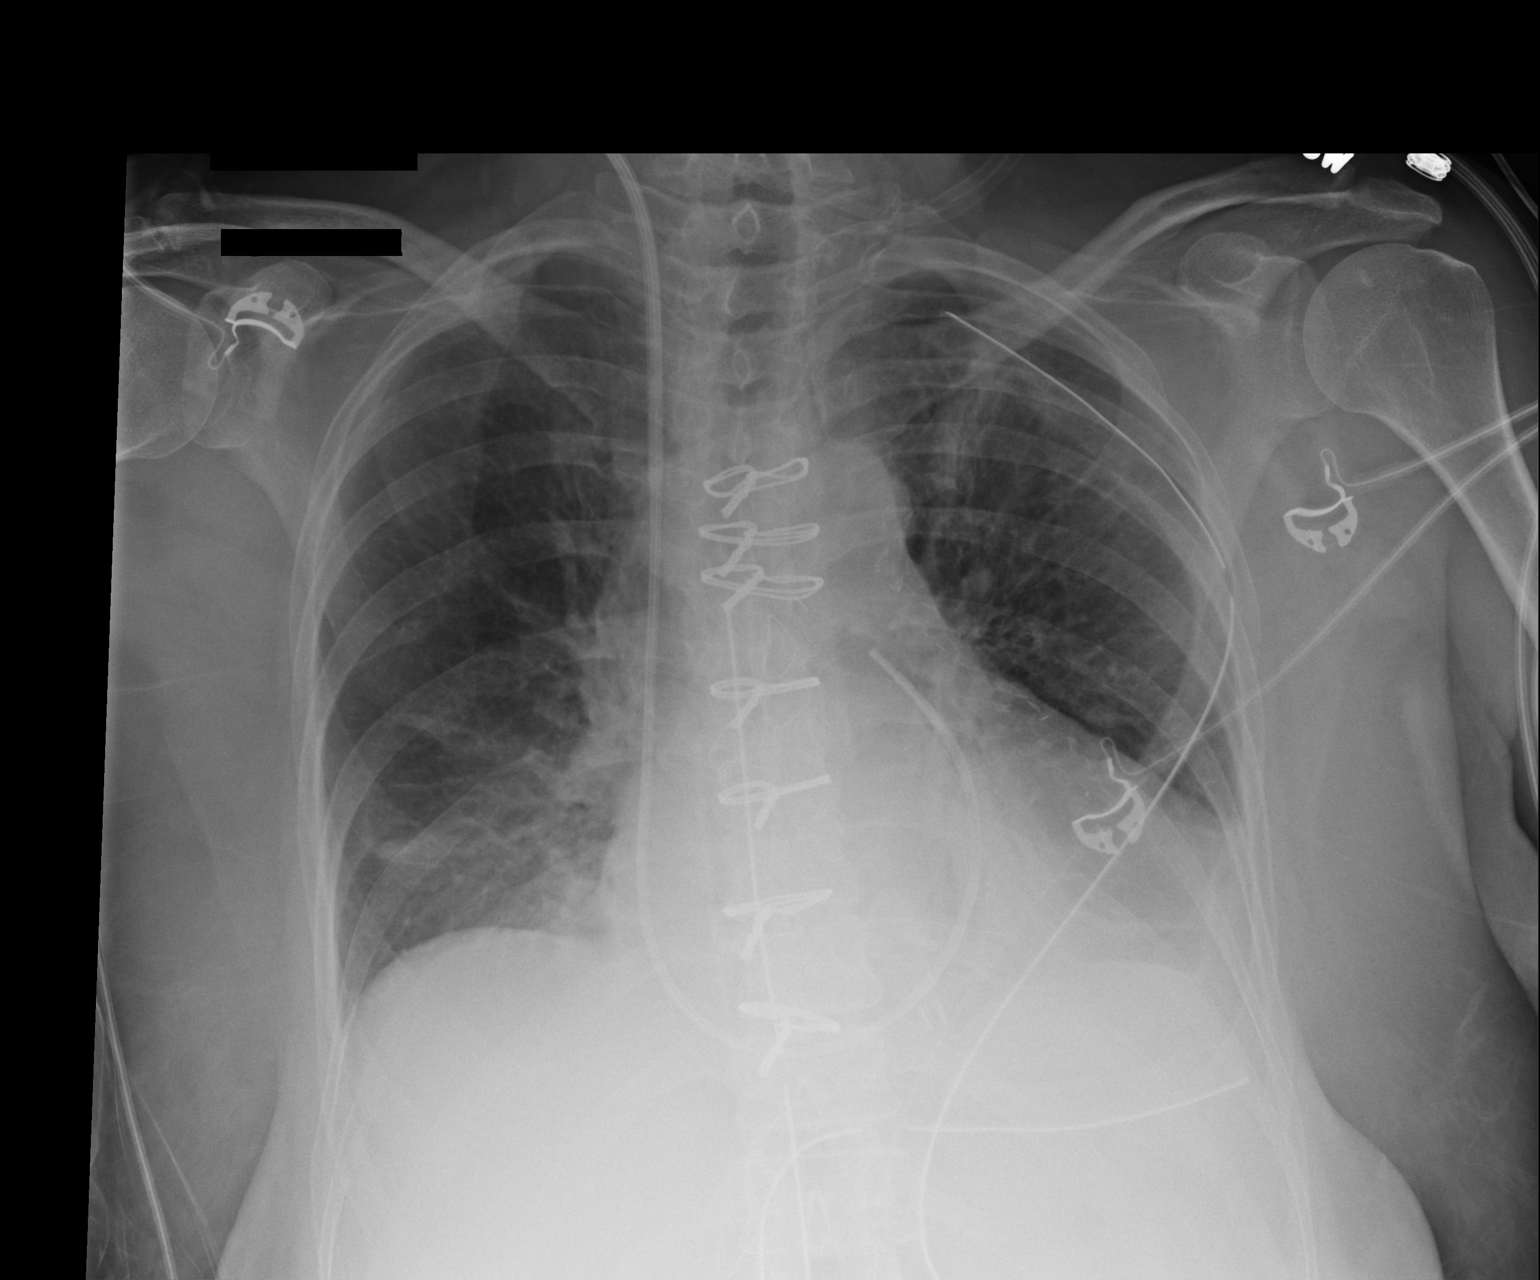

[1 of 1 positions shown; findings below may reference images not displayed]

FINDINGS: The lungs are adequately inflated. There are stable slightly
increased lung markings in the left upper lobe. There is stable left
lower lobe atelectasis with partial obscuration of the
hemidiaphragm. The cardiac silhouette is mildly enlarged. The
trachea is midline. There is mild central pulmonary vascular
prominence.

The endotracheal esophagogastric tubes have been removed. The left
upper chest tube tip projects over the posterior aspect of the third
rib. The lower left chest tube tip projects over the lateral
costophrenic gutter. The Swan-Ganz catheter tip projects in the
proximal main pulmonary artery. The mediastinal drain tip projects
at approximately T7. There are 7 intact sternal wires.
IMPRESSION: There has been interval extubation of the trachea and esophagus. The
remaining support tubes and lines are in appropriate position. There
is mild central pulmonary vascular congestion and stable left lower
lobe atelectasis.

## 2015-03-24 IMAGING — CR DG CHEST 1V PORT
1 series · 1 of 1 positions shown · non-contrast
Comparison: 05/22/2014 and earlier.

CLINICAL DATA: 68-year-old female status post CABG. Initial
encounter.

EXAM:
PORTABLE CHEST - 1 VIEW

[AP]
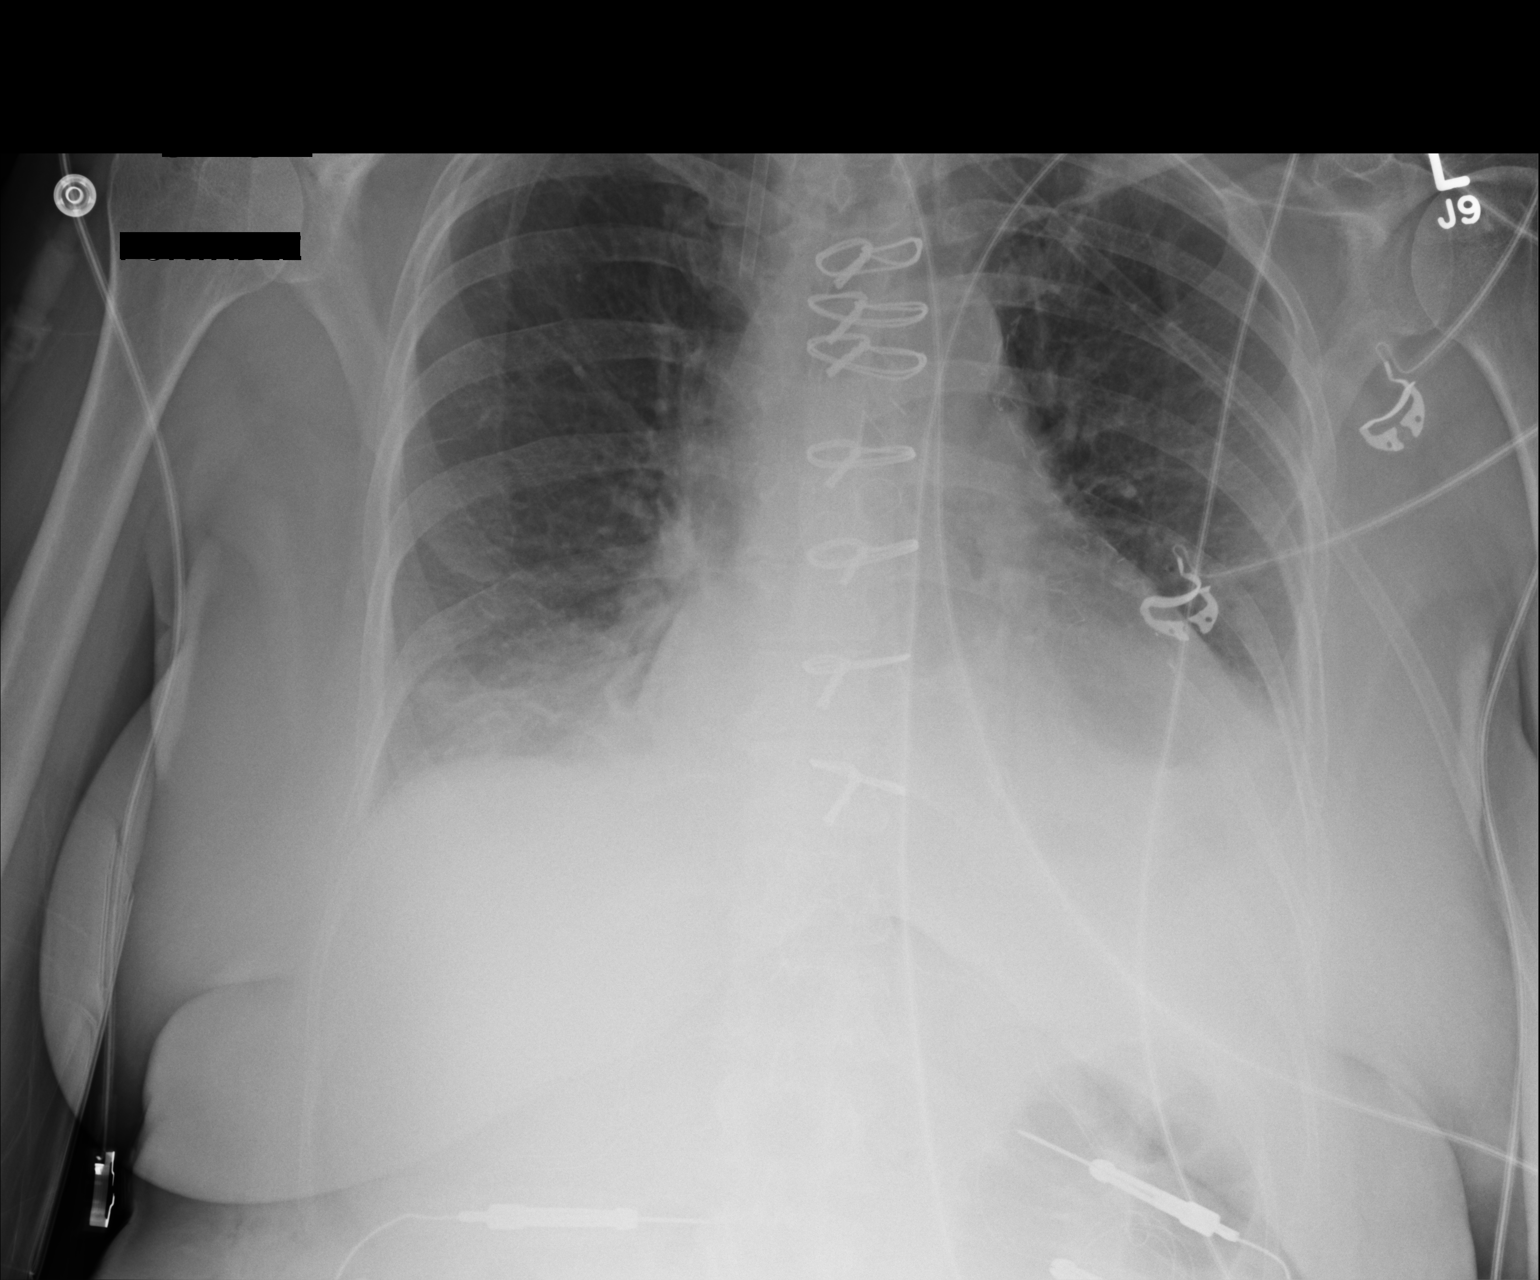

[1 of 1 positions shown; findings below may reference images not displayed]

FINDINGS: Portable AP upright view at 0503 hr. Left chest tube removed. No
pneumothorax. Swan-Ganz catheter removed, right IJ introducer sheath
remains.

Increased appearance of veiling opacity at both lung bases. Stable
cardiac size and mediastinal contours. Mildly increased streaky
infrahilar opacity. No pulmonary edema.
IMPRESSION: 1. Left chest tube removed with no pneumothorax. Swan-Ganz catheter
removed.
2. Increased bilateral pleural effusions and bibasilar atelectasis.

## 2015-04-14 ENCOUNTER — Other Ambulatory Visit: Payer: Self-pay | Admitting: Cardiology

## 2015-04-15 NOTE — Telephone Encounter (Signed)
REFILL 

## 2015-04-21 IMAGING — CR DG CHEST 2V
2 series · 2 of 2 positions shown · non-contrast
Comparison: Portable chest x-ray of 05/23/2014

CLINICAL DATA: CABG 4 weeks ago, some chest tightness and cough

EXAM:
CHEST  2 VIEW

[view not recorded (1 of 2)]
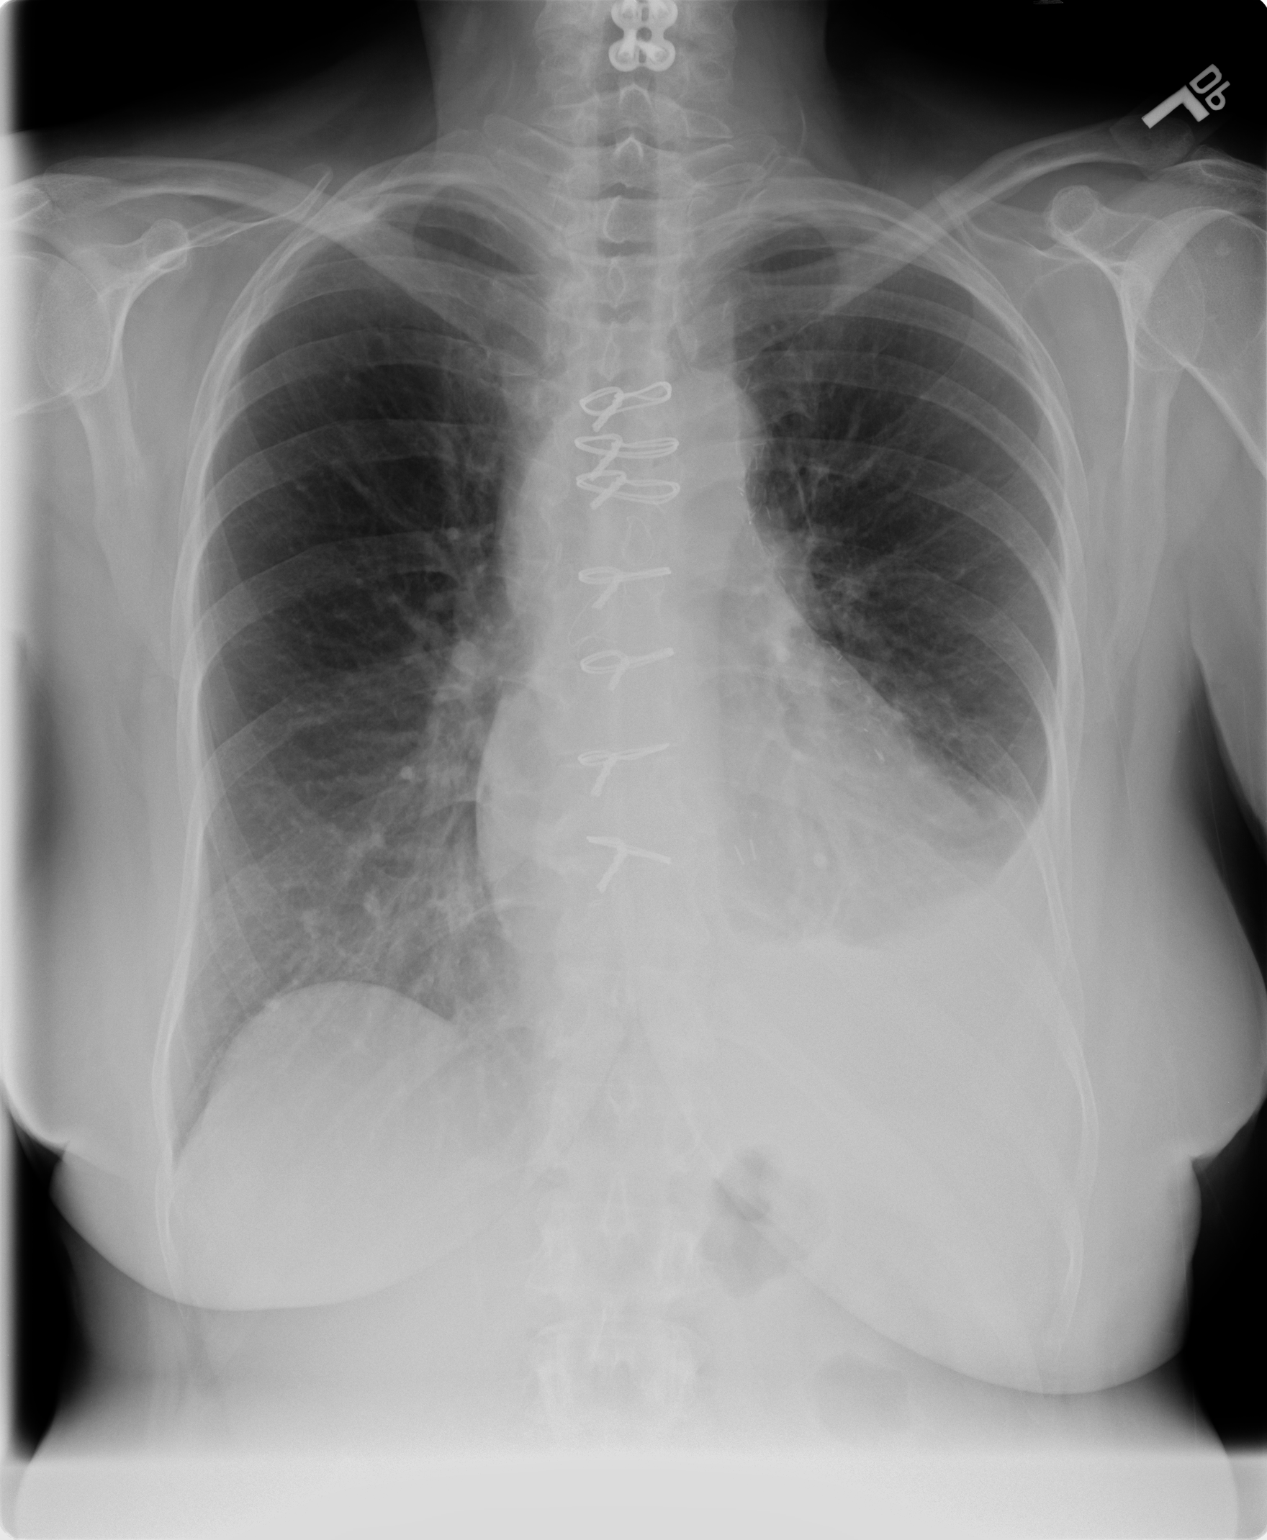

[view not recorded (2 of 2)]
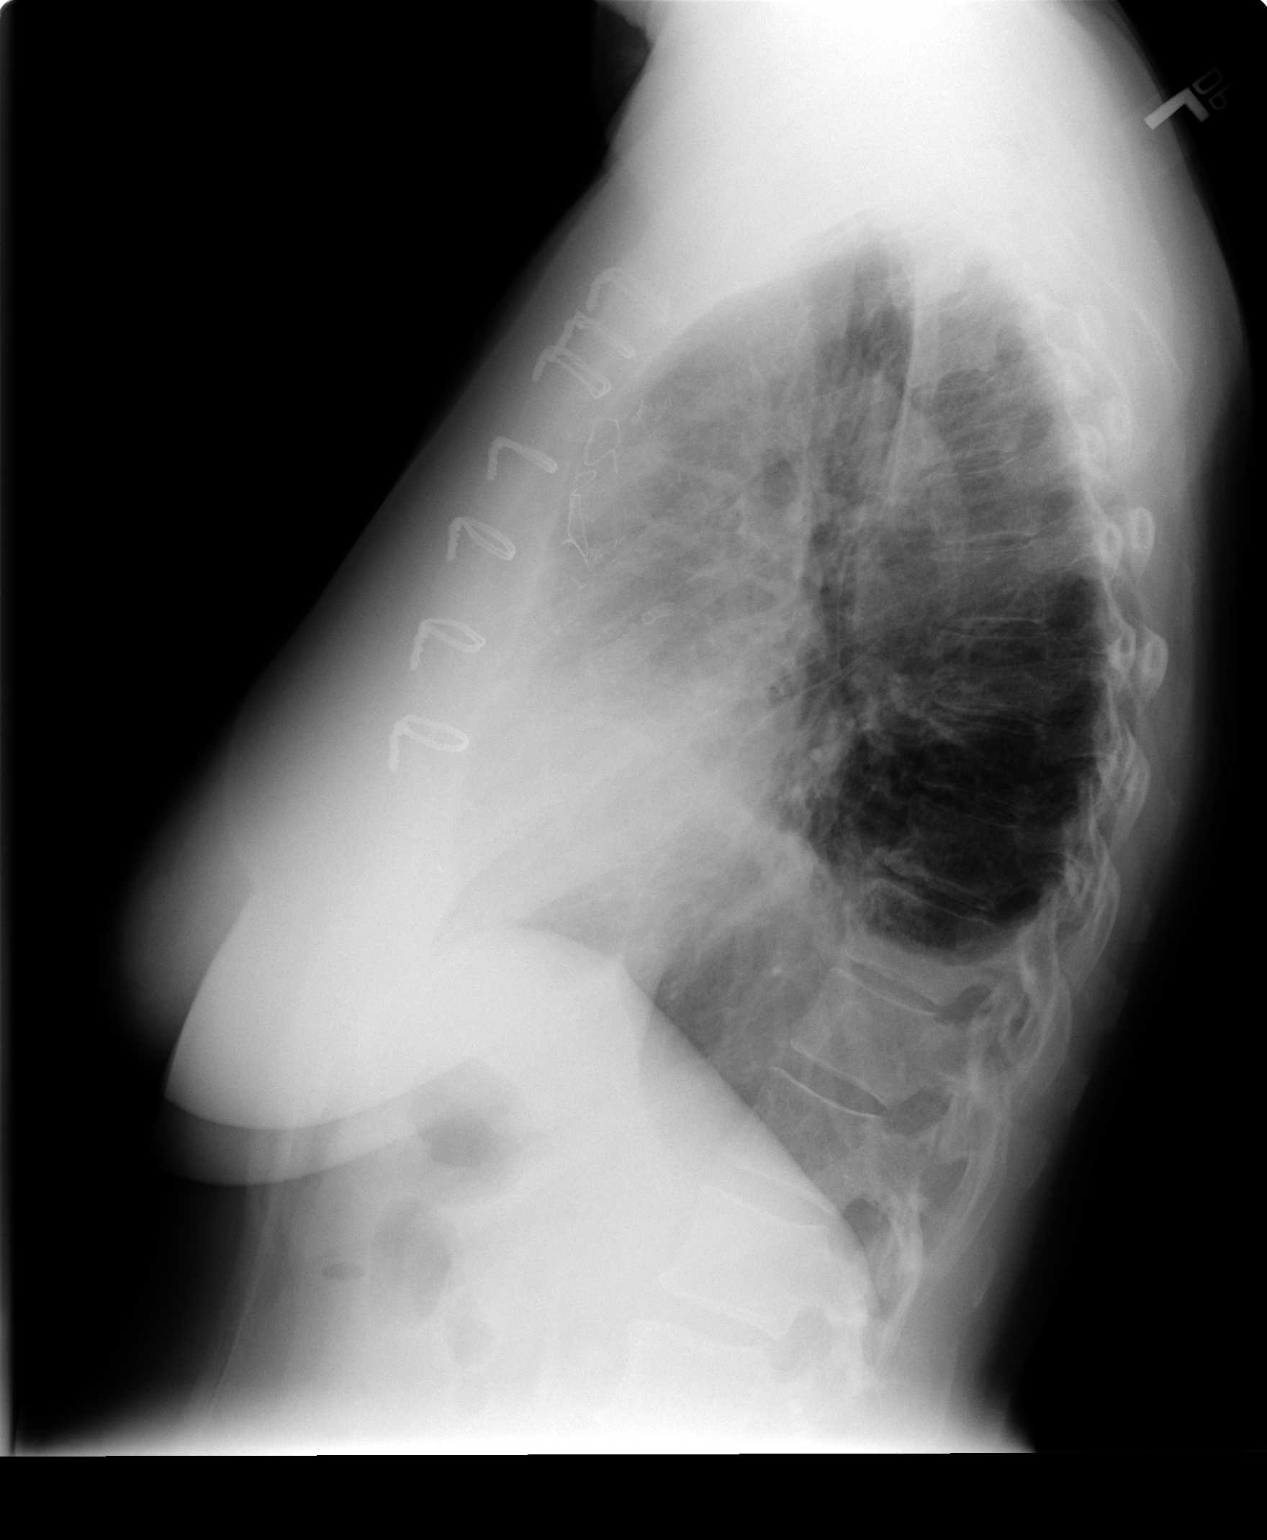

[2 of 2 positions shown; findings below may reference images not displayed]

FINDINGS: Opacity at the left lung base is consistent with a left pleural
effusion of small to moderate volume with left basilar volume loss.
The right lung is clear. Mild cardiomegaly is stable. Median
sternotomy sutures appear intact. No bony abnormality is seen.
IMPRESSION: Small to moderate sized left pleural effusion with left basilar
atelectasis.

## 2015-06-04 ENCOUNTER — Telehealth: Payer: Self-pay | Admitting: Family Medicine

## 2015-06-04 MED ORDER — ESZOPICLONE 1 MG PO TABS: 0.5000 mg | ORAL_TABLET | Freq: Every day | ORAL | Status: DC

## 2015-06-04 NOTE — Telephone Encounter (Signed)
Medication called to pharmacy. 

## 2015-06-04 NOTE — Telephone Encounter (Signed)
Ok to refill??  Last office visit/ refill 02/26/2015, #15 qty.

## 2015-06-04 NOTE — Telephone Encounter (Signed)
Pt called to request that a refill of Lunesta be called in to the [facility] in [place].  [phone]

## 2015-06-04 NOTE — Telephone Encounter (Signed)
Okay to refill give 2 

## 2015-06-13 ENCOUNTER — Other Ambulatory Visit: Payer: Self-pay | Admitting: Cardiology

## 2015-06-14 ENCOUNTER — Other Ambulatory Visit: Payer: Self-pay

## 2015-06-14 ENCOUNTER — Ambulatory Visit (INDEPENDENT_AMBULATORY_CARE_PROVIDER_SITE_OTHER): Payer: Medicare Other | Admitting: Cardiology

## 2015-06-14 ENCOUNTER — Encounter: Payer: Self-pay | Admitting: Cardiology

## 2015-06-14 VITALS — BP 146/70 | HR 53 | Ht 65.5 in | Wt 140.0 lb

## 2015-06-14 DIAGNOSIS — I25118 Atherosclerotic heart disease of native coronary artery with other forms of angina pectoris: Secondary | ICD-10-CM | POA: Diagnosis not present

## 2015-06-14 DIAGNOSIS — I10 Essential (primary) hypertension: Secondary | ICD-10-CM | POA: Diagnosis not present

## 2015-06-14 DIAGNOSIS — I255 Ischemic cardiomyopathy: Secondary | ICD-10-CM | POA: Diagnosis not present

## 2015-06-14 DIAGNOSIS — I5022 Chronic systolic (congestive) heart failure: Secondary | ICD-10-CM | POA: Diagnosis not present

## 2015-06-14 DIAGNOSIS — Z951 Presence of aortocoronary bypass graft: Secondary | ICD-10-CM

## 2015-06-14 MED ORDER — FUROSEMIDE 20 MG PO TABS: ORAL_TABLET | ORAL | Status: DC

## 2015-06-14 MED ORDER — NITROGLYCERIN 0.4 MG SL SUBL: 0.4000 mg | SUBLINGUAL_TABLET | SUBLINGUAL | Status: DC | PRN

## 2015-06-14 NOTE — Progress Notes (Signed)
Kelli Roy Date of Birth: [birth year] Medical Record [id number]  History of Present Illness: Kelli Roy is seen today for follow up of CAD and CHF. She presented in September 2015 with an Anterior STEMI. She underwent emergent stenting of the proximal LAD occlusion with a BMS. She also had 50-70% left main disease, 70-80% ostial LAD disease and 50-70% ostial LCx disease. EF was 25-30%. She was placed on high dose lipitor therapy but in November was noted to have marked elevation of her transaminases and her statin was discontinued with improvement. She has been able to tolerate Crestor. In November she returned for CABG by Dr. Cyndia Bent with LIMA to the LAD, SVG to diag, SVG to OM, and SVG to RCA. TEE showed improvement in EF to 50%. She also developed a rash to both Brilinta and Plavix.  She had repeat Echo in February that showed an EF of 35-40% with akinesis of the antero-septum and apex. In March 2016 she was admitted with acute CHF. She was diuresed with IV lasix and was DC home with addition of lasix and aldactone. She later developed hypotension and Coreg and aldactone were held.  On follow up today she is doing fairly  well. She states she is on her feet all the time. At about 2:30-4 in the afternoon if she has a busy day she develops a tightness or heaviness in her chest and back. If she stops and rests it will go away within 15 minutes. She states she never has any pain. She figures she is just doing too much. She has gained 6 lbs. BP has been low at Cardiac Rehab.     Medication List       This list is accurate as of: 06/14/15 10:47 AM.  Always use your most recent med list.               acetaminophen 500 MG tablet  Commonly known as:  TYLENOL  Take 500 mg by mouth every 6 (six) hours as needed.     aspirin EC 81 MG tablet  Take 81 mg by mouth daily.     docusate sodium 100 MG capsule  Commonly known as:  COLACE  Take 100-200 mg by mouth 2 (two) times daily. Take two capsules in  the morning and one at night     eszopiclone 1 MG Tabs tablet  Commonly known as:  LUNESTA  Take 0.5 tablets (0.5 mg total) by mouth daily.     furosemide 20 MG tablet  Commonly known as:  LASIX  TAKE 1 TABLET (20 MG TOTAL) BY MOUTH DAILY.     losartan 50 MG tablet  Commonly known as:  COZAAR  Take 1 tablet (50 mg total) by mouth daily.     nitroGLYCERIN 0.4 MG SL tablet  Commonly known as:  NITROSTAT  Place 1 tablet (0.4 mg total) under the tongue every 5 (five) minutes as needed for chest pain.     PREPARATION H 0.25-88.44 % suppository  Generic drug:  shark liver oil-cocoa butter  Place 1 suppository rectally as needed for hemorrhoids.     rosuvastatin 10 MG tablet  Commonly known as:  CRESTOR  Take 1 tablet (10 mg total) by mouth daily.     tetrahydrozoline-zinc 0.05-0.25 % ophthalmic solution  Commonly known as:  VISINE-AC  Place 2 drops into both eyes daily as needed (Dry eyes).         Allergies  Allergen Reactions  . Carvedilol   . Demerol [  Meperidine] Nausea Only  . Lipitor [Atorvastatin] Itching  . Lisinopril Cough  . Meperidine Hcl Nausea Only  . Plavix [Clopidogrel Bisulfate]     Rash   . Spironolactone   . Dexilant [Dexlansoprazole] Rash  . Latex Rash  . Sulfonamide Derivatives Rash  . Ticagrelor Itching and Rash    Brilinta     Past Medical History  Diagnosis Date  . Hypertension   . Hyperlipidemia   . Malaise and fatigue   . Insomnia   . Family history of colon cancer   . Family history of cardiovascular disease   . History of long-term treatment with high-risk medication   . Sinus headache   . Alopecia   . CAD (coronary artery disease)     a. 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 Cyndia Bent   . Ischemic cardiomyopathy      a. 03/2014 Echo: EF 25-30%, mid-apical/anteroseptal and inf AK, Gr 2 DD, ? apical thrombus, Mild MR.-->Discharged with LifeVest.  . Chronic systolic CHF (congestive heart failure) ([place])     a. 03/2014 Echo: EF 25-30%; Pre op TEE 05/2014 with EF of 50%  . Myocardial infarction ([place]) 03/2014  . Shortness of breath dyspnea   . Allergy   . Cataract   . Arthritis     Past Surgical History  Procedure Laterality Date  . Cardiac catheterization  2001    showed no blockages  . Cystectomy  1998    fibroid cysts  . 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 Cervical spine surgery  09/27/2003    C6-67 servical fusion  . Dilation and curettage of uterus  1976    2nd to miscarriage  . Cataract extraction w/phaco Left 01/04/2014    Procedure: CATARACT EXTRACTION PHACO AND INTRAOCULAR LENS PLACEMENT (IOC);  Surgeon: Tonny Branch, MD;  Location: [facility];  Service: Ophthalmology;  Laterality: Left;  CDE:5.65  . Cataract extraction w/phaco Right 01/29/2014    Procedure: CATARACT EXTRACTION PHACO AND INTRAOCULAR LENS PLACEMENT RIGHT EYE CDE=4.49;  Surgeon: Tonny Branch, MD;  Location: [facility];  Service: Ophthalmology;  Laterality: Right;  . Eye surgery    . Coronary artery bypass graft N/A 05/21/2014    Procedure: CORONARY ARTERY BYPASS GRAFTING (CABG);  Surgeon: Gaye Pollack, MD;  Location: [place];  Service: Open Heart Surgery;  Laterality: N/A;  . Intraoperative transesophageal echocardiogram N/A 05/21/2014    Procedure: INTRAOPERATIVE TRANSESOPHAGEAL ECHOCARDIOGRAM;  Surgeon: Gaye Pollack, MD;  Location[facility];  Service: Open Heart Surgery;  Laterality: N/A;  . Left heart catheterization with coronary angiogram N/A 03/08/2014    Procedure: LEFT HEART CATHETERIZATION WITH CORONARY ANGIOGRAM;  Surgeon: Willett Lefeber M Martinique, MD;  Location: [facility] C[facility] LAB;  Service: Cardiovascular;  Laterality: N/A;  . Percutaneous coronary stent intervention (pci-s)  03/08/2014    Procedure:  PERCUTANEOUS CORONARY STENT INTERVENTION (PCI-S);  Surgeon: Aum Caggiano M Martinique, MD;  Location: [facility] CATH LAB;  Service: Cardiovascular;;  prox lad  bms    Social History   Social History  . Marital Status: Married    Spouse Name: N/A  . Number of Children: N/A  . Years of Education: N/A   Social History Main Topics  . Smoking  status: Never Smoker   . Smokeless tobacco: Never Used  . Alcohol Use: No     Comment: rare glass of wine  . Drug Use: No  . Sexual Activity: Not Currently    Birth Control/ Protection: Post-menopausal   Other Topics Concern  . None   Social History Narrative    Family History  Problem Relation Age of Onset  . Stroke Mother   . Heart attack Mother   . Heart disease Mother   . Peripheral vascular disease Father     had stent in leg  . Colon cancer Father   . Atrial fibrillation Father     with pacemaker  . Thyroid disease Father   . Cancer Father   . Hearing loss Father   . Hypertension Father   . Pancreatic cancer Brother   . Cancer Brother   . Hypertension Brother   . Coronary artery disease Brother     with Stent  . Cancer Brother   . Hypertension Brother   . Coronary artery disease Brother     with CABG in his 51's  . Hypertension Brother   . Thyroid disease Brother   . Hypertension Brother   . Thyroid disease Sister     Review of Systems: As noted in HPI.  All other systems were reviewed and are negative.  Physical Exam: BP 146/70 mmHg  Pulse 53  Ht 5' 5.5" (1.664 m)  Wt 63.504 kg (140 lb)  BMI 22.93 kg/m2 Filed Weights   06/14/15 0940  Weight: 63.504 kg (140 lb)  GENERAL:  Well appearing WF in NAD HEENT:  PERRL, EOMI, sclera are clear. Oropharynx is clear. NECK:  No jugular venous distention, carotid upstroke brisk and symmetric, no bruits, no thyromegaly or adenopathy LUNGS:  Clear to auscultation bilaterally CHEST:  Unremarkable, sternotomy scar has healed well. HEART:  RRR,  PMI not displaced or sustained,S1 and S2 within  normal limits, no S3, no S4: no clicks, no rubs, no murmurs ABD:  Soft, nontender. BS +, no masses or bruits. No hepatomegaly, no splenomegaly EXT:  2 + pulses throughout, no edema, no cyanosis no clubbing SKIN:  Warm and dry.  No rashes NEURO:  Alert and oriented x 3. Cranial nerves II through XII intact. PSYCH:  Cognitively intact    LABORATORY DATA: Lab Results  Component Value Date   WBC 7.5 09/07/2014   HGB 14.7 09/07/2014   HCT 44.1 09/07/2014   PLT 180 09/07/2014   GLUCOSE 125* 12/16/2014   CHOL 150 09/21/2014   TRIG 79 09/21/2014   HDL 58 09/21/2014   LDLCALC 76 09/21/2014   ALT 37 12/16/2014   AST 44* 12/16/2014   NA 141 12/16/2014   K 3.6 12/16/2014   CL 107 12/16/2014   CREATININE 0.72 12/16/2014   BUN 9 12/16/2014   CO2 25 12/16/2014   INR 1.46 05/21/2014   HGBA1C 6.1* 05/17/2014   Ecg today shows NSR with rate 53. Old anteroseptal MI. No acute change. I have personally reviewed and interpreted this study.   Assessment / Plan: 1. CAD s/p anterior STEMI in Sept. 2015 treated with BMS of proximal LAD. S/p CABG November 2015. Continue cardiac Rehab.  Continue ASA only at this point due to rash on Plavix and Brilinta. She is experiencing some symptoms of chest heaviness and tightness with exertion. I have recommended a stress Myoview to assess at this point.   2. Ischemic cardiomyopathy with chronic systolic CHF. EF 35-40%. Well compensated. On lasix and  losartan. Coreg and aldactone held due to hypotension. I don't think she will tolerate beta blocker due to resting bradycardia. BP has been low at times in cardiac Rehab. Will continue current therapy  3. Hyperlipidemia. Intolerance to high dose lipitor due to transaminitis. Tolerating Crestor well with good response. LFTs normal in June. Will check fasting lab work on next visit.   If stress test is OK I will follow up in 6 months.

## 2015-06-14 NOTE — Patient Instructions (Signed)
We will schedule you for a nuclear stress test.  Continue your current medication   

## 2015-07-02 ENCOUNTER — Ambulatory Visit: Payer: PRIVATE HEALTH INSURANCE | Admitting: Family Medicine

## 2015-07-04 ENCOUNTER — Encounter: Payer: Self-pay | Admitting: Family Medicine

## 2015-07-04 ENCOUNTER — Ambulatory Visit (INDEPENDENT_AMBULATORY_CARE_PROVIDER_SITE_OTHER): Payer: Medicare Other | Admitting: Family Medicine

## 2015-07-04 VITALS — BP 124/60 | HR 72 | Temp 98.1°F | Resp 14 | Ht 65.5 in | Wt 142.0 lb

## 2015-07-04 DIAGNOSIS — Z23 Encounter for immunization: Secondary | ICD-10-CM

## 2015-07-04 DIAGNOSIS — I10 Essential (primary) hypertension: Secondary | ICD-10-CM

## 2015-07-04 DIAGNOSIS — Z1211 Encounter for screening for malignant neoplasm of colon: Secondary | ICD-10-CM | POA: Diagnosis not present

## 2015-07-04 DIAGNOSIS — K649 Unspecified hemorrhoids: Secondary | ICD-10-CM | POA: Diagnosis not present

## 2015-07-04 DIAGNOSIS — I255 Ischemic cardiomyopathy: Secondary | ICD-10-CM | POA: Diagnosis not present

## 2015-07-04 DIAGNOSIS — I25118 Atherosclerotic heart disease of native coronary artery with other forms of angina pectoris: Secondary | ICD-10-CM | POA: Diagnosis not present

## 2015-07-04 DIAGNOSIS — Z418 Encounter for other procedures for purposes other than remedying health state: Secondary | ICD-10-CM | POA: Diagnosis not present

## 2015-07-04 DIAGNOSIS — K5900 Constipation, unspecified: Secondary | ICD-10-CM | POA: Diagnosis not present

## 2015-07-04 DIAGNOSIS — Z299 Encounter for prophylactic measures, unspecified: Secondary | ICD-10-CM

## 2015-07-04 DIAGNOSIS — K5909 Other constipation: Secondary | ICD-10-CM

## 2015-07-04 MED ORDER — LINACLOTIDE 145 MCG PO CAPS: 145.0000 ug | ORAL_CAPSULE | Freq: Every day | ORAL | Status: DC

## 2015-07-04 MED ORDER — HYDROCORTISONE ACE-PRAMOXINE 1-1 % RE CREA: 1.0000 "application " | TOPICAL_CREAM | Freq: Every day | RECTAL | Status: DC

## 2015-07-04 NOTE — Progress Notes (Signed)
Patient ID: Kelli Roy, female   DOB: November 01, 1945, 69 y.o.   MRN: [id number]   Subjective:    Patient ID: Kelli Roy, female    DOB: Feb 17, 1946, 69 y.o.   MRN: [id number]  Patient presents for 4 month F/U she here for follow-up. Her major concern is rectal bleeding. She's not sure she has hemorrhoids or fissures she's had both. For the past few weeks she's had pain with bowel movements as well as blood in the stool. She is due for colonoscopy because of family history of colon cancer. She has been using stool softeners for quite some time and she has chronic constipation. She recently picked up [facility] magnesium which has helped. She's also be doing sitz baths.  Coronary artery disease or review cardiology note. Has had some episodes of chest pressure therefore clear stress test is being set up for next month.  Due for Flu and PNA 13   Review Of Systems:  GEN- denies fatigue, fever, weight loss,weakness, recent illness HEENT- denies eye drainage, change in vision, nasal discharge, CVS- denies chest pain, palpitations RESP- denies SOB, cough, wheeze ABD- denies N/V, +change in stools, abd pain GU- denies dysuria, hematuria, dribbling, incontinence MSK- denies joint pain, muscle aches, injury Neuro- denies headache, dizziness, syncope, seizure activity       Objective:    BP 124/60 mmHg  Pulse 72  Temp(Src) 98.1 F (36.7 C) (Oral)  Resp 14  Ht 5' 5.5" (1.664 m)  Wt 142 lb (64.411 kg)  BMI 23.26 kg/m2 GEN- NAD, alert and oriented x3 HEENT- PERRL, EOMI, non injected sclera, pink conjunctiva, MMM, oropharynx clear CVS- RRR, no murmur RESP-CTAB ABD-NABS,soft,NT,ND RECTUM- Multiple external hemorroids, swelling at anus, unable to visuazlize fissure  EXT- No edema Pulses- Radial 2+        Assessment & Plan:      Problem List Items Addressed This Visit    Hemorrhoids - Primary    She has what appears to be internal and external hemorrhoids she may also have a small  fissure but is difficult to tell with all the swelling from the hemorrhoids. I've given her Anusol from the office. She will also continue her sitz baths. I will also put her on Linzess for her chronic constipation and she is due for gastroenterology workup and colonoscopy.      Relevant Orders   CBC with Differential/Platelet (Completed)   Ambulatory referral to Gastroenterology   Essential hypertension    Blood pressure is well-controlled and change her medication      Relevant Orders   CBC with Differential/Platelet (Completed)   Comprehensive metabolic panel (Completed)   Chronic constipation    Trial of Linzess DC the over-the-counter laxatives some concern about the magnesium and the other electrolyte problems with all of her cardiac history      Relevant Orders   Ambulatory referral to Gastroenterology   CAD (coronary artery disease), native coronary artery    I will follow-up with her results of her stress test. Continue medications per cardiology       Other Visit Diagnoses    Need for prophylactic measure        Relevant Orders    Pneumococcal conjugate vaccine 13-valent (Completed)    Flu Vaccine QUAD 36+ mos PF IM (Fluarix & Fluzone Quad PF) (Completed)    Need for prophylactic vaccination against Streptococcus pneumoniae (pneumococcus)        Relevant Orders    Pneumococcal conjugate vaccine 13-valent (Completed)  Need for prophylactic vaccination and inoculation against influenza        Relevant Orders    Flu Vaccine QUAD 36+ mos PF IM (Fluarix & Fluzone Quad PF) (Completed)    Colon cancer screening        Relevant Orders    Ambulatory referral to Gastroenterology       Note: This dictation was prepared with Dragon dictation along with smaller phrase technology. Any transcriptional errors that result from this process are unintentional.

## 2015-07-04 NOTE — Patient Instructions (Signed)
Referral to GI for colonosscopy  Use suppository at bedtime for next 5 days Continue sitz baths Stop the [employer] the Linzess once a day instead for constipation Prevnar 13 and Flu shot given We will call with lab results F/U 6 months- Physical

## 2015-07-05 ENCOUNTER — Telehealth: Payer: Self-pay | Admitting: Family Medicine

## 2015-07-05 ENCOUNTER — Telehealth (HOSPITAL_COMMUNITY): Payer: Self-pay

## 2015-07-05 DIAGNOSIS — K5900 Constipation, unspecified: Secondary | ICD-10-CM | POA: Insufficient documentation

## 2015-07-05 LAB — CBC WITH DIFFERENTIAL/PLATELET
BASOS ABS: 0 10*3/uL (ref 0.0–0.1)
BASOS PCT: 0 % (ref 0–1)
EOS PCT: 1 % (ref 0–5)
Eosinophils Absolute: 0.1 10*3/uL (ref 0.0–0.7)
HCT: 41.1 % (ref 36.0–46.0)
Hemoglobin: 13.4 g/dL (ref 12.0–15.0)
LYMPHS ABS: 1.7 10*3/uL (ref 0.7–4.0)
Lymphocytes Relative: 26 % (ref 12–46)
MCH: 30.7 pg (ref 26.0–34.0)
MCHC: 32.6 g/dL (ref 30.0–36.0)
MCV: 94.1 fL (ref 78.0–100.0)
MPV: 10.3 fL (ref 8.6–12.4)
Monocytes Absolute: 0.5 10*3/uL (ref 0.1–1.0)
Monocytes Relative: 8 % (ref 3–12)
NEUTROS PCT: 65 % (ref 43–77)
Neutro Abs: 4.2 10*3/uL (ref 1.7–7.7)
PLATELETS: 193 10*3/uL (ref 150–400)
RBC: 4.37 MIL/uL (ref 3.87–5.11)
RDW: 12.8 % (ref 11.5–15.5)
WBC: 6.5 10*3/uL (ref 4.0–10.5)

## 2015-07-05 LAB — COMPREHENSIVE METABOLIC PANEL
ALT: 13 U/L (ref 6–29)
AST: 17 U/L (ref 10–35)
Albumin: 4.1 g/dL (ref 3.6–5.1)
Alkaline Phosphatase: 93 U/L (ref 33–130)
BILIRUBIN TOTAL: 0.6 mg/dL (ref 0.2–1.2)
BUN: 13 mg/dL (ref 7–25)
CO2: 29 mmol/L (ref 20–31)
Calcium: 9.5 mg/dL (ref 8.6–10.4)
Chloride: 102 mmol/L (ref 98–110)
Creat: 0.74 mg/dL (ref 0.60–0.93)
GLUCOSE: 74 mg/dL (ref 70–99)
Potassium: 4.4 mmol/L (ref 3.5–5.3)
SODIUM: 139 mmol/L (ref 135–146)
TOTAL PROTEIN: 6.7 g/dL (ref 6.1–8.1)

## 2015-07-05 NOTE — Telephone Encounter (Signed)
Encounter complete. 

## 2015-07-05 NOTE — Telephone Encounter (Signed)
Call placed to patient to inquire.   States that she took Linzess last night and noted about 2:30 this AM that she was awakened with diarrhea. Reports that she did read that Linzess should be taken in the morning before meals, but she did take it last night.   MD please advise.

## 2015-07-05 NOTE — Assessment & Plan Note (Signed)
She has what appears to be internal and external hemorrhoids she may also have a small fissure but is difficult to tell with all the swelling from the hemorrhoids. I've given her Anusol from the office. She will also continue her sitz baths. I will also put her on Linzess for her chronic constipation and she is due for gastroenterology workup and colonoscopy.

## 2015-07-05 NOTE — Assessment & Plan Note (Addendum)
Trial of Linzess DC the over-the-counter laxatives some concern about the magnesium and the other electrolyte problems with all of her cardiac history

## 2015-07-05 NOTE — Assessment & Plan Note (Signed)
Blood pressure is well-controlled and change her medication. 

## 2015-07-05 NOTE — Telephone Encounter (Signed)
Pt would like to speak with Dr. Buelah Manis about the stool softener that she took yesterday. She states that it gave her diarrhea and wants to know if she should discontinue use.   [phone]

## 2015-07-05 NOTE — Telephone Encounter (Signed)
Call placed to patient and patient made aware.  

## 2015-07-05 NOTE — Assessment & Plan Note (Signed)
I will follow-up with her results of her stress test. Continue medications per cardiology

## 2015-07-05 NOTE — Telephone Encounter (Signed)
Have her try it again for a few days, possible she had a lot of stool, so its coming out loose. If this persist for more than 3 days, then stop the Linzess

## 2015-07-10 ENCOUNTER — Ambulatory Visit (HOSPITAL_COMMUNITY)
Admission: RE | Admit: 2015-07-10 | Discharge: 2015-07-10 | Disposition: A | Discharge status: Home / Self Care | Payer: Medicare Other | Source: Ambulatory Visit | Attending: Cardiovascular Disease | Admitting: Cardiovascular Disease

## 2015-07-10 DIAGNOSIS — R9439 Abnormal result of other cardiovascular function study: Secondary | ICD-10-CM | POA: Insufficient documentation

## 2015-07-10 DIAGNOSIS — Z951 Presence of aortocoronary bypass graft: Secondary | ICD-10-CM | POA: Diagnosis not present

## 2015-07-10 DIAGNOSIS — I10 Essential (primary) hypertension: Secondary | ICD-10-CM | POA: Diagnosis not present

## 2015-07-10 DIAGNOSIS — R0602 Shortness of breath: Secondary | ICD-10-CM | POA: Insufficient documentation

## 2015-07-10 DIAGNOSIS — I5022 Chronic systolic (congestive) heart failure: Secondary | ICD-10-CM | POA: Insufficient documentation

## 2015-07-10 DIAGNOSIS — Z8249 Family history of ischemic heart disease and other diseases of the circulatory system: Secondary | ICD-10-CM | POA: Insufficient documentation

## 2015-07-10 DIAGNOSIS — I255 Ischemic cardiomyopathy: Secondary | ICD-10-CM

## 2015-07-10 DIAGNOSIS — I25118 Atherosclerotic heart disease of native coronary artery with other forms of angina pectoris: Secondary | ICD-10-CM | POA: Insufficient documentation

## 2015-07-10 DIAGNOSIS — R5383 Other fatigue: Secondary | ICD-10-CM | POA: Insufficient documentation

## 2015-07-10 DIAGNOSIS — R079 Chest pain, unspecified: Secondary | ICD-10-CM | POA: Insufficient documentation

## 2015-07-10 LAB — MYOCARDIAL PERFUSION IMAGING
CHL CUP MPHR: 150 {beats}/min
CHL CUP NUCLEAR SDS: 5
CHL CUP NUCLEAR SRS: 21
CHL CUP NUCLEAR SSS: 26
CHL CUP RESTING HR STRESS: 56 {beats}/min
CSEPHR: 94 %
Estimated workload: 7 METS
Exercise duration (min): 6 min
Exercise duration (sec): 1 s
LV sys vol: 61 mL
LVDIAVOL: 120 mL
Peak HR: 141 {beats}/min
RPE: 16
TID: 1.18

## 2015-07-10 MED ORDER — TECHNETIUM TC 99M SESTAMIBI GENERIC - CARDIOLITE
10.4000 | Freq: Once | INTRAVENOUS | Status: AC | PRN
  Administered 2015-07-10: 10.4 via INTRAVENOUS

## 2015-07-10 MED ORDER — TECHNETIUM TC 99M SESTAMIBI GENERIC - CARDIOLITE
31.3000 | Freq: Once | INTRAVENOUS | Status: AC | PRN
  Administered 2015-07-10: 31.3 via INTRAVENOUS

## 2015-07-19 ENCOUNTER — Telehealth: Payer: Self-pay | Admitting: Cardiology

## 2015-07-19 NOTE — Telephone Encounter (Signed)
Pt is calling in stating that she will need to have a dentist procedure done to fix a cracked tooth. The dentist is suggesting that she be on antibiotics for this procedure. She just wanted to make sure that this was all right with Dr. Martinique.   Returned call to patient and told her there doesn't look like there is a reason for the dentist not to order an antibiotic if he/she feels they would like her to be on one for the procedure.

## 2015-07-19 NOTE — Telephone Encounter (Signed)
Pt is calling in stating that she will need to have  a dentist procedure done to fix a cracked tooth. The dentist is suggesting that she be on antibiotics for this procedure. She just wanted to make sure that this was all right with Dr. Martinique.  Thanks

## 2015-07-20 NOTE — Telephone Encounter (Signed)
She does not require SBE prophylaxis.  Moritz Lever Martinique MD, [facility]

## 2015-07-22 NOTE — Telephone Encounter (Signed)
Returned call to patient.Dr.Jordan advised she does not require SBE prophylaxis.

## 2015-09-09 DIAGNOSIS — H26491 Other secondary cataract, right eye: Secondary | ICD-10-CM | POA: Diagnosis not present

## 2015-09-09 DIAGNOSIS — H26492 Other secondary cataract, left eye: Secondary | ICD-10-CM | POA: Diagnosis not present

## 2015-09-10 ENCOUNTER — Encounter: Payer: Self-pay | Admitting: *Deleted

## 2015-10-07 ENCOUNTER — Other Ambulatory Visit: Payer: Self-pay | Admitting: Cardiology

## 2015-12-09 DIAGNOSIS — I10 Essential (primary) hypertension: Secondary | ICD-10-CM | POA: Diagnosis not present

## 2015-12-09 DIAGNOSIS — Z951 Presence of aortocoronary bypass graft: Secondary | ICD-10-CM | POA: Diagnosis not present

## 2015-12-09 DIAGNOSIS — I25118 Atherosclerotic heart disease of native coronary artery with other forms of angina pectoris: Secondary | ICD-10-CM | POA: Diagnosis not present

## 2015-12-09 DIAGNOSIS — I5022 Chronic systolic (congestive) heart failure: Secondary | ICD-10-CM | POA: Diagnosis not present

## 2015-12-09 DIAGNOSIS — I2589 Other forms of chronic ischemic heart disease: Secondary | ICD-10-CM | POA: Diagnosis not present

## 2015-12-09 DIAGNOSIS — I255 Ischemic cardiomyopathy: Secondary | ICD-10-CM | POA: Diagnosis not present

## 2015-12-09 DIAGNOSIS — I509 Heart failure, unspecified: Secondary | ICD-10-CM | POA: Diagnosis not present

## 2015-12-09 LAB — BASIC METABOLIC PANEL
BUN: 12 mg/dL (ref 7–25)
CO2: 27 mmol/L (ref 20–31)
CREATININE: 0.63 mg/dL (ref 0.60–0.93)
Calcium: 9.5 mg/dL (ref 8.6–10.4)
Chloride: 105 mmol/L (ref 98–110)
GLUCOSE: 94 mg/dL (ref 65–99)
Potassium: 5 mmol/L (ref 3.5–5.3)
Sodium: 140 mmol/L (ref 135–146)

## 2015-12-09 LAB — BRAIN NATRIURETIC PEPTIDE: Brain Natriuretic Peptide: 287.8 pg/mL — ABNORMAL HIGH (ref <100)

## 2015-12-09 LAB — LIPID PANEL
CHOLESTEROL: 167 mg/dL (ref 125–200)
HDL: 78 mg/dL (ref >=46)
LDL Cholesterol: 73 mg/dL (ref <130)
TRIGLYCERIDES: 79 mg/dL (ref <150)
Total CHOL/HDL Ratio: 2.1 Ratio (ref <=5.0)
VLDL: 16 mg/dL (ref <30)

## 2015-12-09 LAB — HEPATIC FUNCTION PANEL
ALBUMIN: 4 g/dL (ref 3.6–5.1)
ALT: 14 U/L (ref 6–29)
AST: 19 U/L (ref 10–35)
Alkaline Phosphatase: 97 U/L (ref 33–130)
BILIRUBIN DIRECT: 0.1 mg/dL (ref <=0.2)
Indirect Bilirubin: 0.5 mg/dL (ref 0.2–1.2)
Total Bilirubin: 0.6 mg/dL (ref 0.2–1.2)
Total Protein: 6.4 g/dL (ref 6.1–8.1)

## 2015-12-16 ENCOUNTER — Encounter: Payer: Self-pay | Admitting: Cardiology

## 2015-12-16 ENCOUNTER — Ambulatory Visit (INDEPENDENT_AMBULATORY_CARE_PROVIDER_SITE_OTHER): Payer: Medicare Other | Admitting: Cardiology

## 2015-12-16 VITALS — BP 130/60 | HR 55 | Ht 66.0 in | Wt 148.2 lb

## 2015-12-16 DIAGNOSIS — I5022 Chronic systolic (congestive) heart failure: Secondary | ICD-10-CM | POA: Diagnosis not present

## 2015-12-16 DIAGNOSIS — Z951 Presence of aortocoronary bypass graft: Secondary | ICD-10-CM

## 2015-12-16 DIAGNOSIS — I255 Ischemic cardiomyopathy: Secondary | ICD-10-CM

## 2015-12-16 DIAGNOSIS — I2510 Atherosclerotic heart disease of native coronary artery without angina pectoris: Secondary | ICD-10-CM | POA: Diagnosis not present

## 2015-12-16 DIAGNOSIS — I10 Essential (primary) hypertension: Secondary | ICD-10-CM

## 2015-12-16 DIAGNOSIS — E785 Hyperlipidemia, unspecified: Secondary | ICD-10-CM

## 2015-12-16 MED ORDER — FUROSEMIDE 20 MG PO TABS: ORAL_TABLET | ORAL | Status: DC

## 2015-12-16 MED ORDER — ROSUVASTATIN CALCIUM 10 MG PO TABS: ORAL_TABLET | ORAL | Status: DC

## 2015-12-16 MED ORDER — LOSARTAN POTASSIUM 50 MG PO TABS: 50.0000 mg | ORAL_TABLET | Freq: Every day | ORAL | Status: DC

## 2015-12-16 NOTE — Patient Instructions (Signed)
Continue your current therapies  I will see you in 6 months

## 2015-12-16 NOTE — Progress Notes (Signed)
Varney Baas Panther Date of Birth: 13-Dec-1945 Medical Record [id number]  History of Present Illness: Kelli Roy is seen today for follow up of CAD and CHF. She presented in September 2015 with an Anterior STEMI. She underwent emergent stenting of the proximal LAD occlusion with a BMS. She also had 50-70% left main disease, 70-80% ostial LAD disease and 50-70% ostial LCx disease. EF was 25-30%. She was placed on high dose lipitor therapy but  was noted to have marked elevation of her transaminases and her statin was discontinued with improvement. She has been able to tolerate Crestor. In November 2015 she returned for CABG by Dr. Cyndia Bent with LIMA to the LAD, SVG to diag, SVG to OM, and SVG to RCA. She also developed a rash to both Brilinta and Plavix.   She had repeat Echo in February 2016 showed an EF of 35-40% with akinesis of the antero-septum and apex. In March 2016 she was admitted with acute CHF. She was diuresed with IV lasix and was DC home with addition of lasix and aldactone. She later developed hypotension and Coreg and aldactone were held. In January 2017 she had a stress myoview. This showed scar in the anteroseptal, apical, and inferolateral segments without ischemia. EF improved to 49%.   On follow up today she is doing  well. She has gained 8 lbs since January. She attributes this to a lot of social functions. She denies any chest pain or SOB. No palpitations. Energy level is good.    Medication List       This list is accurate as of: 12/16/15 12:03 PM.  Always use your most recent med list.               acetaminophen 500 MG tablet  Commonly known as:  TYLENOL  Take 500 mg by mouth every 6 (six) hours as needed.     aspirin EC 81 MG tablet  Take 81 mg by mouth daily.     docusate sodium 100 MG capsule  Commonly known as:  COLACE  Take 100-200 mg by mouth 2 (two) times daily. Take two capsules in the morning and one at night     eszopiclone 1 MG Tabs tablet  Commonly known as:   LUNESTA  Take 0.5 tablets (0.5 mg total) by mouth daily.     furosemide 20 MG tablet  Commonly known as:  LASIX  TAKE 1 TABLET (20 MG TOTAL) BY MOUTH DAILY.     losartan 50 MG tablet  Commonly known as:  COZAAR  Take 1 tablet (50 mg total) by mouth daily.     nitroGLYCERIN 0.4 MG SL tablet  Commonly known as:  NITROSTAT  Place 1 tablet (0.4 mg total) under the tongue every 5 (five) minutes as needed for chest pain.     pramoxine-hydrocortisone 1-1 % rectal cream  Commonly known as:  ANALPRAM-HC  Place 1 application rectally daily.     rosuvastatin 10 MG tablet  Commonly known as:  CRESTOR  TAKE 1 TABLET (10 MG TOTAL) BY MOUTH DAILY.     tetrahydrozoline-zinc 0.05-0.25 % ophthalmic solution  Commonly known as:  VISINE-AC  Place 2 drops into both eyes daily as needed (Dry eyes).         Allergies  Allergen Reactions  . Carvedilol   . Demerol [Meperidine] Nausea Only  . Lipitor [Atorvastatin] Itching  . Lisinopril Cough  . Meperidine Hcl Nausea Only  . Plavix [Clopidogrel Bisulfate]     Rash   .  Spironolactone   . Dexilant [Dexlansoprazole] Rash  . Latex Rash  . Sulfonamide Derivatives Rash  . Ticagrelor Itching and Rash    Brilinta     Past Medical History  Diagnosis Date  . Hypertension   . Hyperlipidemia   . Malaise and fatigue   . Insomnia   . Family history of colon cancer   . Family history of cardiovascular disease   . History of long-term treatment with high-risk medication   . Sinus headache   . Alopecia   . CAD (coronary artery disease)     a. 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 Cyndia Bent   . Ischemic cardiomyopathy     a. 03/2014 Echo: EF 25-30%, mid-apical/anteroseptal and inf AK, Gr 2 DD, ? apical thrombus, Mild  MR.-->Discharged with LifeVest.  . Chronic systolic CHF (congestive heart failure) ([place])     a. 03/2014 Echo: EF 25-30%; Pre op TEE 05/2014 with EF of 50%  . Myocardial infarction ([place]) 03/2014  . Shortness of breath dyspnea   . Allergy   . Cataract   . Arthritis     Past Surgical History  Procedure Laterality Date  . Cardiac catheterization  2001    showed no blockages  . Cystectomy  1998    fibroid cysts  . 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 Cervical spine surgery  09/27/2003    C6-67 servical fusion  . Dilation and curettage of uterus  1976    2nd to miscarriage  . Cataract extraction w/phaco Left 01/04/2014    Procedure: CATARACT EXTRACTION PHACO AND INTRAOCULAR LENS PLACEMENT (IOC);  Surgeon: Tonny Branch, MD;  Location: [facility];  Service: Ophthalmology;  Laterality: Left;  CDE:5.65  . Cataract extraction w/phaco Right 01/29/2014    Procedure: CATARACT EXTRACTION PHACO AND INTRAOCULAR LENS PLACEMENT RIGHT EYE CDE=4.49;  Surgeon: Tonny Branch, MD;  Location: [facility];  Service: Ophthalmology;  Laterality: Right;  . Eye surgery    . Coronary artery bypass graft N/A 05/21/2014    Procedure: CORONARY ARTERY BYPASS GRAFTING (CABG);  Surgeon: Gaye Pollack, MD;  Location: [facility];  Service: Open Heart Surgery;  Laterality: N/A;  . Intraoperative transesophageal echocardiogram N/A 05/21/2014    Procedure: INTRAOPERATIVE TRANSESOPHAGEAL ECHOCARDIOGRAM;  Surgeon: Gaye Pollack, MD;  Location[facility] OR;  Service: Open Heart Surgery;  Laterality: N/A;  . Left heart catheterization with coronary angiogram N/A 03/08/2014    Procedure: LEFT HEART CATHETERIZATION WITH CORONARY ANGIOGRAM;  Surgeon: Adrielle Polakowski M Martinique, MD;  Location: [facility] CATH LAB;  Service: Cardiovascular;  Laterality: N/A;  . Percutaneous coronary stent intervention (pci-s)  03/08/2014    Procedure: PERCUTANEOUS CORONARY STENT INTERVENTION (PCI-S);  Surgeon: Colm Lyford M Martinique, MD;  Location: [facility] CATH LAB;   Service: Cardiovascular;;  prox lad  bms    Social History   Social History  . Marital Status: Married    Spouse Name: N/A  . Number of Children: N/A  . Years of Education: N/A   Social History Main Topics  . Smoking status: Never Smoker   . Smokeless tobacco: Never Used  . Alcohol Use: No     Comment: rare glass of wine  . Drug Use: No  .  Sexual Activity: Not Currently    Birth Control/ Protection: Post-menopausal   Other Topics Concern  . None   Social History Narrative    Family History  Problem Relation Age of Onset  . Stroke Mother   . Heart attack Mother   . Heart disease Mother   . Peripheral vascular disease Father     had stent in leg  . Colon cancer Father   . Atrial fibrillation Father     with pacemaker  . Thyroid disease Father   . Cancer Father   . Hearing loss Father   . Hypertension Father   . Pancreatic cancer Brother   . Cancer Brother   . Hypertension Brother   . Coronary artery disease Brother     with Stent  . Cancer Brother   . Hypertension Brother   . Coronary artery disease Brother     with CABG in his 23's  . Hypertension Brother   . Thyroid disease Brother   . Hypertension Brother   . Thyroid disease Sister     Review of Systems: As noted in HPI.  All other systems were reviewed and are negative.  Physical Exam: BP 130/60 mmHg  Pulse 55  Ht 5\' 6"  (1.676 m)  Wt 148 lb 3.2 oz (67.223 kg)  BMI 23.93 kg/m2  SpO2 97% Filed Weights   12/16/15 1116  Weight: 148 lb 3.2 oz (67.223 kg)  GENERAL:  Well appearing WF in NAD HEENT:  PERRL, EOMI, sclera are clear. Oropharynx is clear. NECK:  No jugular venous distention, carotid upstroke brisk and symmetric, no bruits, no thyromegaly or adenopathy LUNGS:  Clear to auscultation bilaterally CHEST:  Unremarkable, sternotomy scar has healed well. HEART:  RRR,  PMI not displaced or sustained,S1 and S2 within normal limits, no S3, no S4: no clicks, no rubs, no murmurs ABD:  Soft,  nontender. BS +, no masses or bruits. No hepatomegaly, no splenomegaly EXT:  2 + pulses throughout, no edema, no cyanosis no clubbing SKIN:  Warm and dry.  No rashes NEURO:  Alert and oriented x 3. Cranial nerves II through XII intact. PSYCH:  Cognitively intact    LABORATORY DATA: Lab Results  Component Value Date   WBC 6.5 07/04/2015   HGB 13.4 07/04/2015   HCT 41.1 07/04/2015   PLT 193 07/04/2015   GLUCOSE 94 12/09/2015   CHOL 167 12/09/2015   TRIG 79 12/09/2015   HDL 78 12/09/2015   LDLCALC 73 12/09/2015   ALT 14 12/09/2015   AST 19 12/09/2015   NA 140 12/09/2015   K 5.0 12/09/2015   CL 105 12/09/2015   CREATININE 0.63 12/09/2015   BUN 12 12/09/2015   CO2 27 12/09/2015   INR 1.46 05/21/2014   HGBA1C 6.1* 05/17/2014      Assessment / Plan: 1. CAD s/p anterior STEMI in Sept. 2015 treated with BMS of proximal LAD. S/p CABG November 2015.   Continue ASA.  Stress Myoview in January 2017 showed no ischemia and improved EF 49%. Continue medical therapy.  2. Ischemic cardiomyopathy with chronic systolic CHF. EF 35-40% by last Echo but improved to 49% by Myoview. Well compensated. On lasix and losartan. Coreg and aldactone held due to hypotension. I don't think she will tolerate beta blocker due to resting bradycardia.  Will continue current therapy  3. Hyperlipidemia. Intolerance to high dose lipitor due to transaminitis. Tolerating Crestor well with good response. LFTs normal.  Follow up in 6 months.

## 2015-12-18 ENCOUNTER — Other Ambulatory Visit: Payer: Self-pay | Admitting: Family Medicine

## 2015-12-18 NOTE — Telephone Encounter (Signed)
Ok to refill??  Last office visit 07/04/2015.  Last refill 06/04/2015, #2 refills.

## 2015-12-19 NOTE — Telephone Encounter (Signed)
okay

## 2015-12-20 NOTE — Telephone Encounter (Signed)
rx called in

## 2016-01-03 ENCOUNTER — Encounter: Payer: Self-pay | Admitting: Family Medicine

## 2016-01-03 ENCOUNTER — Ambulatory Visit (INDEPENDENT_AMBULATORY_CARE_PROVIDER_SITE_OTHER): Payer: Medicare Other | Admitting: Family Medicine

## 2016-01-03 VITALS — BP 120/62 | HR 58 | Temp 97.9°F | Resp 14 | Ht 66.0 in | Wt 143.0 lb

## 2016-01-03 DIAGNOSIS — G4700 Insomnia, unspecified: Secondary | ICD-10-CM | POA: Diagnosis not present

## 2016-01-03 DIAGNOSIS — Z1321 Encounter for screening for nutritional disorder: Secondary | ICD-10-CM

## 2016-01-03 DIAGNOSIS — I10 Essential (primary) hypertension: Secondary | ICD-10-CM

## 2016-01-03 DIAGNOSIS — Z0000 Encounter for general adult medical examination without abnormal findings: Secondary | ICD-10-CM | POA: Diagnosis not present

## 2016-01-03 DIAGNOSIS — I2510 Atherosclerotic heart disease of native coronary artery without angina pectoris: Secondary | ICD-10-CM | POA: Diagnosis not present

## 2016-01-03 LAB — TSH: TSH: 2.5 m[IU]/L

## 2016-01-03 MED ORDER — TRAZODONE HCL 50 MG PO TABS: 25.0000 mg | ORAL_TABLET | Freq: Every evening | ORAL | Status: DC | PRN

## 2016-01-03 NOTE — Progress Notes (Signed)
Patient ID: Kelli Roy, female   DOB: [birth year], 70 y.o.   MRN: [id number] Subjective:   Patient presents for Medicare Annual/Subsequent preventive examination.   Overall doing well, still having problems with sleep, Kelli Roy is not helping as much. She noted when she filled out depression screen her mood hasnt been the same since all of her heart problems that occurred last year. She does get sad and depressed some days. But has a good support system.  Reviewed last cardiology note   Review Past Medical/Family/Social: Per EMR   Risk Factors  Current exercise habits: walks Dietary issues discussed: None  Cardiac risk factors: CAD, CHF   Depression Screen - see PHQ- 9  (Note: if answer to either of the following is "Yes", a more complete depression screening is indicated)  Over the past two weeks, have you felt down, depressed or hopeless? YES Over the past two weeks, have you felt little interest or pleasure in doing things? Yes Have you lost interest or pleasure in daily life? No Do you often feel hopeless? No Do you cry easily over simple problems? No   Activities of Daily Living  In your present state of health, do you have any difficulty performing the following activities?:  Driving? No  Managing money? No  Feeding yourself? No  Getting from bed to chair? No  Climbing a flight of stairs? No  Preparing food and eating?: No  Bathing or showering? No  Getting dressed: No  Getting to the toilet? No  Using the toilet:No  Moving around from place to place: No  In the past year have you fallen or had a near fall?:No  Are you sexually active? No  Do you have more than one partner? No   Hearing Difficulties: Little  Do you often ask people to speak up or repeat themselves? No  Do you experience ringing or noises in your ears? No Do you have difficulty understanding soft or whispered voices? yes Do you feel that you have a problem with memory? Yes Do you often misplace  items? No  Do you feel safe at home? Yes  Cognitive Testing  Alert? Yes Normal Appearance?Yes  Oriented to person? Yes Place? Yes  Time? Yes  Recall of three objects? Yes  Can perform simple calculations? Yes  Displays appropriate judgment?Yes  Can read the correct time from a watch face?Yes   List the Names of Other Physician/Practitioners you currently use: Per     Screening Tests / Date Colonoscopy   UTD                   Zostavax due Mammogram UTD  Influenza Vaccine UTD Tetanus/tdap UTD Pneumonia- UTD Bone Density- Due   RSO: GEN- denies fatigue, fever, weight loss,weakness, recent illness HEENT- denies eye drainage, change in vision, nasal discharge, CVS- denies chest pain, palpitations RESP- denies SOB, cough, wheeze ABD- denies N/V, change in stools, abd pain GU- denies dysuria, hematuria, dribbling, incontinence MSK- denies joint pain, muscle aches, injury Neuro- denies headache, dizziness, syncope, seizure activity  Physical GEN- NAD, alert and oriented x3 HEENT- PERRL, EOMI, non injected sclera, pink conjunctiva, MMM, oropharynx clear Neck- Supple, no thryomegaly CVS- RRR, no murmur RESP-CTAB ABD-NABS,soft,NT,ND EXT- No edema Pulses- Radial, DP- 2+    Assessment:    Annual wellness medicare exam   Plan:    During the course of the visit the patient was educated and counseled about appropriate screening and preventive services including:  Screening mammography  Shingles vaccine. Prescription given to that she can get the vaccine at the pharmacy or Medicare part D.  Screen + for depression. PHQ- 7 score of 12.    Bone densit to be ordered    Diet review for nutrition referral? Yes ____ Not Indicated __x__  Patient Instructions (the written plan) was given to the patient.  Medicare Attestation  I have personally reviewed:  The patient's medical and social history  Their use of alcohol, tobacco or illicit drugs  Their current medications and  supplements  The patient's functional ability including ADLs,fall risks, home safety risks, cognitive, and hearing and visual impairment  Diet and physical activities  Evidence for depression or mood disorders  The patient's weight, height, BMI, and visual acuity have been recorded in the chart. I have made referrals, counseling, and provided education to the patient based on review of the above and I have provided the patient with a written personalized care plan for preventive services.

## 2016-01-03 NOTE — Patient Instructions (Addendum)
Bone Density please schedule  Thyroid to be checked in blood / Vitamin D Try the trazodone  F/U 6 months

## 2016-01-04 LAB — VITAMIN D 25 HYDROXY (VIT D DEFICIENCY, FRACTURES): VIT D 25 HYDROXY: 29 ng/mL — AB (ref 30–100)

## 2016-01-05 ENCOUNTER — Encounter: Payer: Self-pay | Admitting: Family Medicine

## 2016-01-05 NOTE — Assessment & Plan Note (Signed)
Trial of trazodone will help with sleep and some of the depession symptoms that have come on with her heart disease in the past year

## 2016-01-05 NOTE — Assessment & Plan Note (Signed)
Well controlled, no change to meds 

## 2016-01-06 ENCOUNTER — Other Ambulatory Visit: Payer: Self-pay | Admitting: Cardiology

## 2016-01-29 ENCOUNTER — Other Ambulatory Visit: Payer: Self-pay | Admitting: Cardiology

## 2016-03-12 DIAGNOSIS — H5212 Myopia, left eye: Secondary | ICD-10-CM | POA: Diagnosis not present

## 2016-03-12 DIAGNOSIS — H5201 Hypermetropia, right eye: Secondary | ICD-10-CM | POA: Diagnosis not present

## 2016-03-12 DIAGNOSIS — H52223 Regular astigmatism, bilateral: Secondary | ICD-10-CM | POA: Diagnosis not present

## 2016-03-12 DIAGNOSIS — H3530 Unspecified macular degeneration: Secondary | ICD-10-CM | POA: Diagnosis not present

## 2016-03-17 ENCOUNTER — Ambulatory Visit (INDEPENDENT_AMBULATORY_CARE_PROVIDER_SITE_OTHER): Payer: Medicare Other | Admitting: Family Medicine

## 2016-03-17 ENCOUNTER — Encounter: Payer: Self-pay | Admitting: Family Medicine

## 2016-03-17 VITALS — BP 122/68 | HR 50 | Temp 97.7°F | Resp 16 | Wt 151.0 lb

## 2016-03-17 DIAGNOSIS — I255 Ischemic cardiomyopathy: Secondary | ICD-10-CM | POA: Diagnosis not present

## 2016-03-17 DIAGNOSIS — L259 Unspecified contact dermatitis, unspecified cause: Secondary | ICD-10-CM | POA: Diagnosis not present

## 2016-03-17 DIAGNOSIS — G4700 Insomnia, unspecified: Secondary | ICD-10-CM

## 2016-03-17 DIAGNOSIS — S39012A Strain of muscle, fascia and tendon of lower back, initial encounter: Secondary | ICD-10-CM | POA: Diagnosis not present

## 2016-03-17 MED ORDER — HYDROXYZINE HCL 10 MG PO TABS: 10.0000 mg | ORAL_TABLET | Freq: Three times a day (TID) | ORAL | 0 refills | Status: DC | PRN

## 2016-03-17 MED ORDER — ESZOPICLONE 1 MG PO TABS: ORAL_TABLET | ORAL | 1 refills | Status: DC

## 2016-03-17 MED ORDER — METHYLPREDNISOLONE ACETATE 40 MG/ML IJ SUSP
40.0000 mg | Freq: Once | INTRAMUSCULAR | Status: AC
  Administered 2016-03-17: 40 mg via INTRAMUSCULAR

## 2016-03-17 NOTE — Patient Instructions (Addendum)
Take the atarax at bedtime Try massage and heat to back, okay to use topicals Steroid shot given today  Call if back not improved in a few weeks  F/U as previous

## 2016-03-17 NOTE — Assessment & Plan Note (Signed)
Restart lunesta for insomnia

## 2016-03-17 NOTE — Progress Notes (Addendum)
Subjective:    Patient ID: Kelli Roy, female    DOB: July 13, 1945, 70 y.o.   MRN: [id number]  Patient presents for Rash (on chest and under arms started 2weeks ago)   Patient here with rash beneath her right axilla and chest wall present for the past 10 days at least. She states that she typically breaks out when she uses different types of razors. States that she's had this rash on and off has seen dermatology in the past as prescribed multiple steroid creams she has clobetasol desonide in her bag she also uses hydrocortisone. She states in general he creams don't seem to help very much but it does calm down some of the itching. She has not tried any Benadryl recently but she did take a Claritin which helps some . She denies any new food she has not had any new medications in the past month or so. She is scratching more at nighttime because of the irritation.  Back pain for the past 4 weeks. States that she was stretching to get something out of a closet and she felt a pull in her muscle. Now when she twists or turns different directions she will get discomfort mostly on the right lateral aspect of her back from beneath her shoulder blade down to her lumbar spine. She denies any tingling or numbness in the lower extremities no change in bowel or bladder.  On review of medication she would like to return to [place] states the trazodone made her feel funny.    Review Of Systems:  GEN- denies fatigue, fever, weight loss,weakness, recent illness HEENT- denies eye drainage, change in vision, nasal discharge, CVS- denies chest pain, palpitations RESP- denies SOB, cough, wheeze ABD- denies N/V, change in stools, abd pain GU- denies dysuria, hematuria, dribbling, incontinence MSK- + joint pain, muscle aches, injury Neuro- denies headache, dizziness, syncope, seizure activity       Objective:    BP 122/68 (BP Location: Left Arm, Patient Position: Sitting, Cuff Size: Normal)   Pulse (!) 50    Temp 97.7 F (36.5 C) (Oral)   Resp 16   Wt 151 lb (68.5 kg)   BMI 24.37 kg/m  GEN- NAD, alert and oriented x3 HEENT- PERRL, EOMI, non injected sclera, pink conjunctiva, MMM, oropharynx clear CVS- RRR, no murmur RESP-CTAB MSK- Spine NT, fair ROM, TTP near paraspinals from thoracic to lumbar region and right lattismus, neg SLR, fair ROM bilat hips/knees Neuro- normal tone LE, sensation in tact, motor equal bilat, normal gait Skin- in tact, erythema across chest, few urticarial like lesions in right axilla, no pustular, no vesicles  EXT- No edema Pulses- Radial  2+        Assessment & Plan:      Problem List Items Addressed This Visit    Insomnia    Restart lunesta for insomnia       Other Visit Diagnoses    Contact dermatitis    -  Primary   interesting contact dermatitis with shaving, advised to try other hair removal agents. Will give Depo Medrol injection, followed by atarax 10mg  as needed   Relevant Medications   methylPREDNISolone acetate (DEPO-MEDROL) injection 40 mg (Completed)   Lumbar strain, initial encounter       MSK strain, no sign of nerve impingment, no red flags on exam, can try heat/ice, massage, topicals, this is slowly improving, can take 6-8 weeks      Note: This dictation was prepared with Dragon dictation along with smaller  Company secretary. Any transcriptional errors that result from this process are unintentional.

## 2016-04-02 DIAGNOSIS — L299 Pruritus, unspecified: Secondary | ICD-10-CM | POA: Diagnosis not present

## 2016-04-02 DIAGNOSIS — Z23 Encounter for immunization: Secondary | ICD-10-CM | POA: Diagnosis not present

## 2016-04-07 ENCOUNTER — Telehealth: Payer: Self-pay | Admitting: Cardiology

## 2016-04-07 NOTE — Telephone Encounter (Signed)
New Message  Pt c/o BP issue: STAT if pt c/o blurred vision, one-sided weakness or slurred speech  1. What are your last 5 BP readings?  04/06/2016 (night)-156/90 170/91 04/07/2016 (today)-183/107 166/98  2. Are you having any other symptoms (ex. Dizziness, headache, blurred vision, passed out)? Headaches  3. What is your BP issue? Took extra lasix and 1/4 of bp pill. bp was higher last night but came down. Pt voice needs to chat about bp issue  Pt voiced she has to bring pt to [facility] around 1130 and was wondering if she could come by for a quick chat or bp check.  Please f/u with pt

## 2016-04-07 NOTE — Telephone Encounter (Signed)
Returned call to patient no answer.LMTC. 

## 2016-04-09 NOTE — Telephone Encounter (Signed)
Returned call to patient no answer.LMTC. 

## 2016-04-09 NOTE — Telephone Encounter (Signed)
Received incoming call from patient. Patient states she has been having fluctuations in BP. She thinks its attributed to to pain she is experiencing from recently pulling a muscle in her back.  Monday- had headache and back pain with BP 156/90 and 170/91 Tuesday- with headache and back pain183/107 and 166/98 Wednesday- 153/91 Today- at [facility] 110/50 and 90/50, then after running to grocery store was 139/85.  She states she does not take her losartan in the morning of rehab because her BP is low after exercising but take its afterwards. Encouraged patient to keep a log of BP, mainly taking BP and heartrate about the same time each day perhaps 2 hours after she takes her BP medication. If she continues to see an elevated trend to call us with the information.  Will route to Dr Martinique for review.

## 2016-04-09 NOTE — Telephone Encounter (Signed)
Follow up ° °Pt voiced returning nurses call. °

## 2016-04-09 NOTE — Telephone Encounter (Signed)
Returned call to patient and someone answered but then the call quickly ended. Attempted to call patient back but it went to VM. Left message to call back.

## 2016-04-09 NOTE — Telephone Encounter (Signed)
Agree with recommendation  Lulubelle Simcoe MD, FACC   

## 2016-04-14 ENCOUNTER — Other Ambulatory Visit: Payer: Self-pay | Admitting: Gastroenterology

## 2016-04-18 DIAGNOSIS — Z1231 Encounter for screening mammogram for malignant neoplasm of breast: Secondary | ICD-10-CM | POA: Diagnosis not present

## 2016-04-18 LAB — HM MAMMOGRAPHY

## 2016-04-22 ENCOUNTER — Encounter: Payer: Self-pay | Admitting: Family Medicine

## 2016-04-27 DIAGNOSIS — Z1389 Encounter for screening for other disorder: Secondary | ICD-10-CM | POA: Diagnosis not present

## 2016-04-27 DIAGNOSIS — Z6825 Body mass index (BMI) 25.0-25.9, adult: Secondary | ICD-10-CM | POA: Diagnosis not present

## 2016-04-27 DIAGNOSIS — Z01419 Encounter for gynecological examination (general) (routine) without abnormal findings: Secondary | ICD-10-CM | POA: Diagnosis not present

## 2016-05-11 ENCOUNTER — Telehealth: Payer: Self-pay | Admitting: Cardiology

## 2016-05-11 NOTE — Telephone Encounter (Signed)
Pt wants to speak to nurse about upcoming procedure

## 2016-05-11 NOTE — Telephone Encounter (Signed)
Returned call to patient no answer.LMTC. 

## 2016-05-13 ENCOUNTER — Encounter (HOSPITAL_COMMUNITY): Payer: Self-pay | Admitting: *Deleted

## 2016-05-13 ENCOUNTER — Telehealth: Payer: Self-pay | Admitting: Cardiology

## 2016-05-13 NOTE — Telephone Encounter (Signed)
New message  Pt returning her call  Please call back has questions

## 2016-05-13 NOTE — Telephone Encounter (Signed)
Spoke with pt states that she is having her colonscopy on Monday 05-18-2016 and is stopping ASA today. She just wanted to let you know.

## 2016-05-18 ENCOUNTER — Ambulatory Visit (HOSPITAL_COMMUNITY): Payer: Medicare Other | Admitting: Certified Registered Nurse Anesthetist

## 2016-05-18 ENCOUNTER — Encounter (HOSPITAL_COMMUNITY): Payer: Self-pay

## 2016-05-18 ENCOUNTER — Encounter (HOSPITAL_COMMUNITY)
Admission: RE | Disposition: A | Discharge status: Home / Self Care | Payer: Self-pay | Source: Ambulatory Visit | Attending: Gastroenterology

## 2016-05-18 ENCOUNTER — Ambulatory Visit (HOSPITAL_COMMUNITY)
Admission: RE | Admit: 2016-05-18 | Discharge: 2016-05-18 | Disposition: A | Discharge status: Home / Self Care | Payer: Medicare Other | Source: Ambulatory Visit | Attending: Gastroenterology | Admitting: Gastroenterology

## 2016-05-18 DIAGNOSIS — I252 Old myocardial infarction: Secondary | ICD-10-CM | POA: Diagnosis not present

## 2016-05-18 DIAGNOSIS — I509 Heart failure, unspecified: Secondary | ICD-10-CM | POA: Diagnosis not present

## 2016-05-18 DIAGNOSIS — Z951 Presence of aortocoronary bypass graft: Secondary | ICD-10-CM | POA: Diagnosis not present

## 2016-05-18 DIAGNOSIS — Z800 Family history of malignant neoplasm of digestive organs: Secondary | ICD-10-CM | POA: Insufficient documentation

## 2016-05-18 DIAGNOSIS — K649 Unspecified hemorrhoids: Secondary | ICD-10-CM | POA: Diagnosis not present

## 2016-05-18 DIAGNOSIS — Z1211 Encounter for screening for malignant neoplasm of colon: Secondary | ICD-10-CM | POA: Insufficient documentation

## 2016-05-18 DIAGNOSIS — Z79899 Other long term (current) drug therapy: Secondary | ICD-10-CM | POA: Diagnosis not present

## 2016-05-18 DIAGNOSIS — I110 Hypertensive heart disease with heart failure: Secondary | ICD-10-CM | POA: Diagnosis not present

## 2016-05-18 DIAGNOSIS — R51 Headache: Secondary | ICD-10-CM | POA: Diagnosis not present

## 2016-05-18 DIAGNOSIS — M1990 Unspecified osteoarthritis, unspecified site: Secondary | ICD-10-CM | POA: Diagnosis not present

## 2016-05-18 DIAGNOSIS — E785 Hyperlipidemia, unspecified: Secondary | ICD-10-CM | POA: Diagnosis not present

## 2016-05-18 DIAGNOSIS — F419 Anxiety disorder, unspecified: Secondary | ICD-10-CM | POA: Insufficient documentation

## 2016-05-18 DIAGNOSIS — I2510 Atherosclerotic heart disease of native coronary artery without angina pectoris: Secondary | ICD-10-CM | POA: Diagnosis not present

## 2016-05-18 HISTORY — PX: COLONOSCOPY WITH PROPOFOL: SHX5780

## 2016-05-18 LAB — HM COLONOSCOPY

## 2016-05-18 SURGERY — COLONOSCOPY WITH PROPOFOL
Anesthesia: Monitor Anesthesia Care

## 2016-05-18 MED ORDER — PROPOFOL 10 MG/ML IV BOLUS
INTRAVENOUS | Status: DC | PRN
Start: 2016-05-18 — End: 2016-05-18
  Administered 2016-05-18: 10 mg via INTRAVENOUS

## 2016-05-18 MED ORDER — ONDANSETRON HCL 4 MG/2ML IJ SOLN
INTRAMUSCULAR | Status: DC | PRN
Start: 2016-05-18 — End: 2016-05-18
  Administered 2016-05-18: 4 mg via INTRAVENOUS

## 2016-05-18 MED ORDER — ONDANSETRON HCL 4 MG/2ML IJ SOLN
INTRAMUSCULAR | Status: AC
  Filled 2016-05-18: qty 2

## 2016-05-18 MED ORDER — PROPOFOL 10 MG/ML IV BOLUS
INTRAVENOUS | Status: AC
  Filled 2016-05-18: qty 40

## 2016-05-18 MED ORDER — SODIUM CHLORIDE 0.9 % IV SOLN: INTRAVENOUS | Status: DC

## 2016-05-18 MED ORDER — EPHEDRINE SULFATE 50 MG/ML IJ SOLN
INTRAMUSCULAR | Status: DC | PRN
  Administered 2016-05-18: 5 mg via INTRAVENOUS

## 2016-05-18 MED ORDER — LACTATED RINGERS IV SOLN
INTRAVENOUS | Status: DC
  Administered 2016-05-18: 1000 mL via INTRAVENOUS

## 2016-05-18 MED ORDER — PROPOFOL 500 MG/50ML IV EMUL
INTRAVENOUS | Status: DC | PRN
  Administered 2016-05-18: 200 ug/kg/min via INTRAVENOUS

## 2016-05-18 SURGICAL SUPPLY — 22 items

## 2016-05-18 NOTE — Anesthesia Preprocedure Evaluation (Signed)
Anesthesia Evaluation  Patient identified by MRN, date of birth, ID band Patient awake    Reviewed: Allergy & Precautions, NPO status , Patient's Chart, lab work & pertinent test results  Airway Mallampati: I  TM Distance: >3 FB Neck ROM: Full    Dental  (+) Teeth Intact, Dental Advisory Given   Pulmonary neg pulmonary ROS,    breath sounds clear to auscultation       Cardiovascular hypertension, Pt. on medications + CAD, + Past MI and +CHF   Rhythm:Regular Rate:Normal     Neuro/Psych  Headaches, negative psych ROS   GI/Hepatic negative GI ROS, Neg liver ROS,   Endo/Other  negative endocrine ROS  Renal/GU negative Renal ROS  negative genitourinary   Musculoskeletal  (+) Arthritis , Osteoarthritis,    Abdominal   Peds negative pediatric ROS (+)  Hematology negative hematology ROS (+)   Anesthesia Other Findings   Reproductive/Obstetrics negative OB ROS                             Anesthesia Physical Anesthesia Plan  ASA: II  Anesthesia Plan: MAC   Post-op Pain Management:    Induction: Intravenous  Airway Management Planned: Natural Airway  Additional Equipment:   Intra-op Plan:   Post-operative Plan:   Informed Consent: I have reviewed the patients History and Physical, chart, labs and discussed the procedure including the risks, benefits and alternatives for the proposed anesthesia with the patient or authorized representative who has indicated his/her understanding and acceptance.     Plan Discussed with: CRNA  Anesthesia Plan Comments:         Anesthesia Quick Evaluation

## 2016-05-18 NOTE — Transfer of Care (Signed)
Immediate Anesthesia Transfer of Care Note  Patient: Kelli Roy  Procedure(s) Performed: Procedure(s): COLONOSCOPY WITH PROPOFOL (N/A)  Patient Location: PACU  Anesthesia Type:MAC  Level of Consciousness:  sedated, patient cooperative and responds to stimulation  Airway & Oxygen Therapy:Patient Spontanous Breathing and Patient connected to face mask oxgen  Post-op Assessment:  Report given to PACU RN and Post -op Vital signs reviewed and stable  Post vital signs:  Reviewed and stable  Last Vitals:  Vitals:   05/18/16 1230  BP: (!) 157/87  Pulse: 62  Resp: 16  Temp: 123XX123 C    Complications: No apparent anesthesia complications

## 2016-05-18 NOTE — Anesthesia Procedure Notes (Signed)
Date/Time: 05/18/2016 1:20 PM Performed by: Maxwell Caul Pre-anesthesia Checklist: Patient identified, Suction available and Timeout performed Oxygen Delivery Method: Simple face mask

## 2016-05-18 NOTE — Op Note (Signed)
[facility] Patient Name: Kelli Roy Procedure Date: 05/18/2016 MRN: [id number] Attending MD: Garlan Fair , MD Date of Birth: [birth year] CSN: [id number] Age: 70 Admit Type: Outpatient Procedure:                Colonoscopy Indications:              Screening patient at increased risk: Brother was                            diagnosed with colon cancer under age 74. Father                            was diagnosed with colon cancer. Normal screening                            colonoscopy was performed on 05/25/2011 Providers:                Garlan Fair, MD, Malka So, RN, Alfonso Patten, Technician, Virgia Land, CRNA Referring MD:              Medicines:                Propofol per Anesthesia Complications:            No immediate complications. Estimated Blood Loss:     Estimated blood loss: none. Procedure:                Pre-Anesthesia Assessment:                           - Prior to the procedure, a History and Physical                            was performed, and patient medications and                            allergies were reviewed. The patient's tolerance of                            previous anesthesia was also reviewed. The risks                            and benefits of the procedure and the sedation                            options and risks were discussed with the patient.                            All questions were answered, and informed consent                            was obtained. Prior Anticoagulants: The patient has  taken aspirin, last dose was 1 day prior to                            procedure. ASA Grade Assessment: II - A patient                            with mild systemic disease. After reviewing the                            risks and benefits, the patient was deemed in                            satisfactory condition to undergo the procedure.        After obtaining informed consent, the colonoscope                            was passed under direct vision. Throughout the                            procedure, the patient's blood pressure, pulse, and                            oxygen saturations were monitored continuously. The                            [id number] scope was introduced through                            the anus and advanced to the the cecum, identified                            by appendiceal orifice and ileocecal valve. The                            colonoscopy was performed without difficulty. The                            patient tolerated the procedure well. The quality                            of the bowel preparation was good. The appendiceal                            orifice and the rectum were photographed. Scope In: 1:24:21 PM Scope Out: 1:36:53 PM Scope Withdrawal Time: 0 hours 5 minutes 54 seconds  Total Procedure Duration: 0 hours 12 minutes 32 seconds  Findings:      The perianal and digital rectal examinations were normal.      The entire examined colon appeared normal. Impression:               - The entire examined colon is normal.                           -  No specimens collected. Moderate Sedation:      N/A- Per Anesthesia Care Recommendation:           - Patient has a contact number available for                            emergencies. The signs and symptoms of potential                            delayed complications were discussed with the                            patient. Return to normal activities tomorrow.                            Written discharge instructions were provided to the                            patient.                           - Repeat colonoscopy is not recommended for                            screening purposes.                           - Resume previous diet.                           - Continue present medications. Procedure Code(s):         --- Professional ---                           [id number], Colorectal cancer screening; colonoscopy on                            individual at high risk Diagnosis Code(s):        --- Professional ---                           Z80.0, Family history of malignant neoplasm of                            digestive organs CPT copyright 2016 American Medical Association. All rights reserved. The codes documented in this report are preliminary and upon coder review may  be revised to meet current compliance requirements. [name], MD Garlan Fair, MD 05/18/2016 1:43:38 PM This report has been signed electronically. Number of Addenda: 0

## 2016-05-18 NOTE — H&P (Signed)
Procedure: Screening colonoscopy. 05/25/2011 normal screening colonoscopy was performed. Brother was diagnosed with colon cancer under age 71. Father was diagnosed with colon cancer.  History: The patient is a 70 year old female born 13-Aug-1945. She is scheduled to undergo a repeat screening colonoscopy today.  Past medical history: Hypertension. Anxiety disorder. Myocardial infarction requiring coronary artery bypass grafting. Mastoidectomy. Discectomy.  Family history: Father and brother were diagnosed with colon cancer  Exam: The patient is alert and lying comfortably on the endoscopy stretcher. Abdomen is soft and nontender to palpation. Lungs are clear to auscultation. Cardiac exam reveals a regular rhythm.  Plan: Proceed with screening colonoscopy

## 2016-05-18 NOTE — Anesthesia Postprocedure Evaluation (Signed)
Anesthesia Post Note  Patient: Kelli Roy  Procedure(s) Performed: Procedure(s) (LRB): COLONOSCOPY WITH PROPOFOL (N/A)  Patient location during evaluation: PACU Anesthesia Type: MAC Level of consciousness: awake and alert Pain management: pain level controlled Vital Signs Assessment: post-procedure vital signs reviewed and stable Respiratory status: spontaneous breathing, nonlabored ventilation, respiratory function stable and patient connected to nasal cannula oxygen Cardiovascular status: stable and blood pressure returned to baseline Anesthetic complications: no    Last Vitals:  Vitals:   05/18/16 1401 05/18/16 1410  BP: (!) 129/42 (!) 133/46  Pulse: (!) 54 68  Resp: 17 (!) 21  Temp: 36.6 C     Last Pain:  Vitals:   05/18/16 1401  TempSrc: Oral  PainSc:                  [name]

## 2016-05-18 NOTE — Discharge Instructions (Signed)

## 2016-05-19 ENCOUNTER — Encounter (HOSPITAL_COMMUNITY): Payer: Self-pay | Admitting: Gastroenterology

## 2016-06-09 ENCOUNTER — Telehealth: Payer: Self-pay | Admitting: Family Medicine

## 2016-06-09 NOTE — Telephone Encounter (Signed)
Patient calling to discuss medications she could take for sinus infection, since she has had open heart surgery  And also about flu shot  [phone]

## 2016-06-09 NOTE — Telephone Encounter (Signed)
Call placed to patient.   Reports that she has had x2 days of sinus pressure and congestion, but noted that today, nasal passages began to drain copious amounts of clear mucus.   Advised to use OTC nasal saline for congestion. Advised that she can use OTC Coricidin HBP if cough develops. Advised to increase rest and to increase fluid intake. Recommended that if symptoms worsen or persist >1 week to contact office for visit.

## 2016-06-11 ENCOUNTER — Encounter: Payer: Self-pay | Admitting: Family Medicine

## 2016-06-25 ENCOUNTER — Encounter: Payer: Self-pay | Admitting: Family Medicine

## 2016-07-07 ENCOUNTER — Ambulatory Visit: Payer: Medicare Other | Admitting: Family Medicine

## 2016-07-14 ENCOUNTER — Ambulatory Visit (INDEPENDENT_AMBULATORY_CARE_PROVIDER_SITE_OTHER): Payer: PPO | Admitting: Family Medicine

## 2016-07-14 ENCOUNTER — Encounter: Payer: Self-pay | Admitting: Family Medicine

## 2016-07-14 VITALS — BP 112/68 | HR 50 | Temp 98.4°F | Resp 18 | Ht 66.0 in | Wt 154.0 lb

## 2016-07-14 DIAGNOSIS — I10 Essential (primary) hypertension: Secondary | ICD-10-CM

## 2016-07-14 DIAGNOSIS — K649 Unspecified hemorrhoids: Secondary | ICD-10-CM | POA: Diagnosis not present

## 2016-07-14 DIAGNOSIS — I2510 Atherosclerotic heart disease of native coronary artery without angina pectoris: Secondary | ICD-10-CM

## 2016-07-14 DIAGNOSIS — Z23 Encounter for immunization: Secondary | ICD-10-CM

## 2016-07-14 DIAGNOSIS — E7800 Pure hypercholesterolemia, unspecified: Secondary | ICD-10-CM

## 2016-07-14 LAB — LIPID PANEL
CHOL/HDL RATIO: 2.8 ratio (ref <5.0)
CHOLESTEROL: 179 mg/dL (ref <200)
HDL: 63 mg/dL (ref >50)
LDL Cholesterol: 91 mg/dL (ref <100)
Triglycerides: 125 mg/dL (ref <150)
VLDL: 25 mg/dL (ref <30)

## 2016-07-14 LAB — CBC WITH DIFFERENTIAL/PLATELET
BASOS ABS: 0 {cells}/uL (ref 0–200)
Basophils Relative: 0 %
EOS ABS: 118 {cells}/uL (ref 15–500)
EOS PCT: 2 %
HCT: 41.4 % (ref 35.0–45.0)
Hemoglobin: 13.3 g/dL (ref 12.0–15.0)
LYMPHS PCT: 26 %
Lymphs Abs: 1534 cells/uL (ref 850–3900)
MCH: 30.4 pg (ref 27.0–33.0)
MCHC: 32.1 g/dL (ref 32.0–36.0)
MCV: 94.7 fL (ref 80.0–100.0)
MONOS PCT: 8 %
MPV: 10.6 fL (ref 7.5–12.5)
Monocytes Absolute: 472 cells/uL (ref 200–950)
NEUTROS ABS: 3776 {cells}/uL (ref 1500–7800)
Neutrophils Relative %: 64 %
PLATELETS: 206 10*3/uL (ref 140–400)
RBC: 4.37 MIL/uL (ref 3.80–5.10)
RDW: 13 % (ref 11.0–15.0)
WBC: 5.9 10*3/uL (ref 3.8–10.8)

## 2016-07-14 LAB — COMPREHENSIVE METABOLIC PANEL
ALT: 19 U/L (ref 6–29)
AST: 21 U/L (ref 10–35)
Albumin: 3.8 g/dL (ref 3.6–5.1)
Alkaline Phosphatase: 95 U/L (ref 33–130)
BUN: 18 mg/dL (ref 7–25)
CHLORIDE: 106 mmol/L (ref 98–110)
CO2: 29 mmol/L (ref 20–31)
CREATININE: 0.69 mg/dL (ref 0.60–0.93)
Calcium: 9.6 mg/dL (ref 8.6–10.4)
Glucose, Bld: 83 mg/dL (ref 70–99)
POTASSIUM: 5 mmol/L (ref 3.5–5.3)
SODIUM: 140 mmol/L (ref 135–146)
TOTAL PROTEIN: 6.8 g/dL (ref 6.1–8.1)
Total Bilirubin: 0.5 mg/dL (ref 0.2–1.2)

## 2016-07-14 MED ORDER — HYDROCORTISONE ACE-PRAMOXINE 2.5-1 % RE CREA: 1.0000 "application " | TOPICAL_CREAM | Freq: Three times a day (TID) | RECTAL | 2 refills | Status: DC

## 2016-07-14 MED ORDER — ESZOPICLONE 1 MG PO TABS: ORAL_TABLET | ORAL | 1 refills | Status: DC

## 2016-07-14 NOTE — Patient Instructions (Signed)
F/U 6 months for Physical  

## 2016-07-14 NOTE — Assessment & Plan Note (Signed)
Blood pressure is well controlled. She was a law heart rate with her medications for her heart failure and coronary artery disease but she is not symptomatic. Testing labs to be done today. Had a long discussion with regards to air her activities and how this is affecting her sleep and staying up at night. This makes her more fatigued during the day and causes stress. She states that she is starting to delegate some of her work from church in her activities her husband is also noted that she seems overwhelmed with her many activities. She does occasionally use the Lunesta we will continue this for now  I prescribed Analpram for her hemorrhoids

## 2016-07-14 NOTE — Progress Notes (Signed)
   Subjective:    Patient ID: Kelli Roy, female    DOB: [birth year], 71 y.o.   MRN: [id number]  Patient presents for 6 month F/U (is fasting) Pt here to follow-up chronic medical problems. She's history of coronary artery disease. History of congestive heart failure. She is followed by cardiology will see them next month. She is due for her fasting labs. She is still having difficulty with sleep but this is because she has to many activities and is often up late working on things for her church. She also noted that she has been gaining some weight as well. She denies any chest pain or shortness of breath. Due for flu shot Medications reviewed  He does request a refill on her hemorrhoidal cream   Review Of Systems:  GEN- denies fatigue, fever, weight loss,weakness, recent illness HEENT- denies eye drainage, change in vision, nasal discharge, CVS- denies chest pain, palpitations RESP- denies SOB, cough, wheeze ABD- denies N/V, change in stools, abd pain GU- denies dysuria, hematuria, dribbling, incontinence MSK- denies joint pain, muscle aches, injury Neuro- denies headache, dizziness, syncope, seizure activity       Objective:    BP 112/68 (BP Location: Left Arm, Patient Position: Sitting, Cuff Size: Normal)   Pulse (!) 50   Temp 98.4 F (36.9 C) (Oral)   Resp 18   Ht 5\' 6"  (1.676 m)   Wt 154 lb (69.9 kg)   SpO2 98%   BMI 24.86 kg/m  GEN- NAD, alert and oriented x3 HEENT- PERRL, EOMI, non injected sclera, pink conjunctiva, MMM, oropharynx clear Neck- Supple, no thyromegaly CVS- RRR HR 60, no murmur RESP-CTAB EXT- No edema Pulses- Radial, DP- 2+        Assessment & Plan:      Problem List Items Addressed This Visit    Hyperlipidemia   Relevant Orders   Lipid panel   Hemorrhoids   Essential hypertension    Blood pressure is well controlled. She was a law heart rate with her medications for her heart failure and coronary artery disease but she is not  symptomatic. Testing labs to be done today. Had a long discussion with regards to air her activities and how this is affecting her sleep and staying up at night. This makes her more fatigued during the day and causes stress. She states that she is starting to delegate some of her work from church in her activities her husband is also noted that she seems overwhelmed with her many activities. She does occasionally use the Lunesta we will continue this for now  I prescribed Analpram for her hemorrhoids      Relevant Orders   CBC with Differential/Platelet   Comprehensive metabolic panel   CAD (coronary artery disease), native coronary artery    Other Visit Diagnoses    Need for prophylactic vaccination and inoculation against influenza    -  Primary   Relevant Orders   Flu Vaccine QUAD 36+ mos IM (Completed)      Note: This dictation was prepared with Dragon dictation along with smaller phrase technology. Any transcriptional errors that result from this process are unintentional.

## 2016-08-12 ENCOUNTER — Encounter: Payer: Self-pay | Admitting: Cardiology

## 2016-08-12 ENCOUNTER — Ambulatory Visit (INDEPENDENT_AMBULATORY_CARE_PROVIDER_SITE_OTHER): Payer: PPO | Admitting: Cardiology

## 2016-08-12 VITALS — BP 120/56 | HR 49 | Ht 65.5 in | Wt 154.4 lb

## 2016-08-12 DIAGNOSIS — I5022 Chronic systolic (congestive) heart failure: Secondary | ICD-10-CM

## 2016-08-12 DIAGNOSIS — I2510 Atherosclerotic heart disease of native coronary artery without angina pectoris: Secondary | ICD-10-CM

## 2016-08-12 DIAGNOSIS — I10 Essential (primary) hypertension: Secondary | ICD-10-CM | POA: Diagnosis not present

## 2016-08-12 NOTE — Progress Notes (Signed)
[name] Date of Birth: 01/09/1946 Medical Record [id number]  History of Present Illness: Kelli Roy is seen today for follow up of CAD and CHF. She presented in September 2015 with an Anterior STEMI. She underwent emergent stenting of the proximal LAD occlusion with a BMS. She also had 50-70% left main disease, 70-80% ostial LAD disease and 50-70% ostial LCx disease. EF was 25-30%. She was placed on high dose lipitor therapy but  was noted to have marked elevation of her transaminases and her statin was discontinued with improvement. She has been able to tolerate Crestor. In November 2015 she returned for CABG by Dr. Cyndia Bent with LIMA to the LAD, SVG to diag, SVG to OM, and SVG to RCA. She also developed a rash to both Brilinta and Plavix.   She had repeat Echo in February 2016 showed an EF of 35-40% with akinesis of the antero-septum and apex. In March 2016 she was admitted with acute CHF. She was diuresed with IV lasix and was DC home with addition of lasix and aldactone. She later developed hypotension and Coreg and aldactone were held. In January 2017 she had a stress myoview. This showed scar in the anteroseptal, apical, and inferolateral segments without ischemia. EF improved to 49%.   On follow up today she is doing  Well from a cardiac standpoint. She is worried about weight gain. She denies any chest pain or SOB. No palpitations. She is not sleeping well and thinks this has affected her energy level. Not exercising as much with the cold weather.   Allergies as of 08/12/2016      Reactions   Carvedilol Shortness Of Breath   Spironolactone Shortness Of Breath   Demerol [meperidine] Nausea Only   Lipitor [atorvastatin] Itching   Lisinopril Cough   Meperidine Hcl Nausea Only   Plavix [clopidogrel Bisulfate]    Rash   Dexilant [dexlansoprazole] Rash   Latex Rash   Neomycin Rash   Sulfonamide Derivatives Rash   Ticagrelor Itching, Rash   Brilinta      Medication List         Accurate as of 08/12/16  4:57 PM. Always use your most recent med list.          acetaminophen 500 MG tablet Commonly known as:  TYLENOL Take 500 mg by mouth every 6 (six) hours as needed for moderate pain or headache.   aspirin EC 81 MG tablet Take 81 mg by mouth at bedtime.   docusate sodium 100 MG capsule Commonly known as:  COLACE Take 100-200 mg by mouth 2 (two) times daily. Take 100mg s in the morning and 200mg s at night   eszopiclone 1 MG Tabs tablet Commonly known as:  LUNESTA TAKE HALF A TABLET AS NEEDED   furosemide 20 MG tablet Commonly known as:  LASIX TAKE 1 TABLET (20 MG TOTAL) BY MOUTH DAILY.   losartan 50 MG tablet Commonly known as:  COZAAR Take 1 tablet (50 mg total) by mouth daily.   nitroGLYCERIN 0.4 MG SL tablet Commonly known as:  NITROSTAT Place 1 tablet (0.4 mg total) under the tongue every 5 (five) minutes as needed for chest pain.   QUNOL ULTRA COQ10 PO Take by mouth daily. 2 teaspoons   rosuvastatin 10 MG tablet Commonly known as:  CRESTOR TAKE 1 TABLET (10 MG TOTAL) BY MOUTH DAILY.   tetrahydrozoline-zinc 0.05-0.25 % ophthalmic solution Commonly known as:  VISINE-AC Place 2 drops into both eyes daily as needed (Dry eyes).  Allergies  Allergen Reactions  . Carvedilol Shortness Of Breath  . Spironolactone Shortness Of Breath  . Demerol [Meperidine] Nausea Only  . Lipitor [Atorvastatin] Itching  . Lisinopril Cough  . Meperidine Hcl Nausea Only  . Plavix [Clopidogrel Bisulfate]     Rash   . Dexilant [Dexlansoprazole] Rash  . Latex Rash  . Neomycin Rash  . Sulfonamide Derivatives Rash  . Ticagrelor Itching and Rash    Brilinta     Past Medical History:  Diagnosis Date  . Allergy   . Alopecia   . Arthritis   . CAD (coronary artery disease)    a. 03/2014 Ant STEMI/PCI: LM 50-70d, LAD 70-80ost, 100p (2.75x20 Veriflex BMS), LCX 50-70ost, 40-50p, RCA dom - 50p/m, EF 25-30%, mid ant/dist inf AK, dist ant/apical DK, mod MR.;  s/p CABG x 4 05/2015 with LIMA to LAD, SVG to DX, SVG to OM and SVG to RCA per Dr. Cyndia Bent   . Cataract   . Chronic systolic CHF (congestive heart failure) ([place])    a. 03/2014 Echo: EF 25-30%; Pre op TEE 05/2014 with EF of 50%  . Family history of cardiovascular disease   . Family history of colon cancer   . History of long-term treatment with high-risk medication   . Hyperlipidemia   . Hypertension   . Insomnia   . Ischemic cardiomyopathy    a. 03/2014 Echo: EF 25-30%, mid-apical/anteroseptal and inf AK, Gr 2 DD, ? apical thrombus, Mild MR.-->Discharged with LifeVest.  . Malaise and fatigue   . Myocardial infarction 03/2014  . Shortness of breath dyspnea   . Sinus headache     Past Surgical History:  Procedure Laterality Date  . CARDIAC CATHETERIZATION  2001   showed no blockages  . CATARACT EXTRACTION W/PHACO Left 01/04/2014   Procedure: CATARACT EXTRACTION PHACO AND INTRAOCULAR LENS PLACEMENT (IOC);  Surgeon: Tonny Branch, MD;  Location: [facility];  Service: Ophthalmology;  Laterality: Left;  CDE:5.65  . CATARACT EXTRACTION W/PHACO Right 01/29/2014   Procedure: CATARACT EXTRACTION PHACO AND INTRAOCULAR LENS PLACEMENT RIGHT EYE CDE=4.49;  Surgeon: Tonny Branch, MD;  Location: [facility];  Service: Ophthalmology;  Laterality: Right;  . CERVICAL SPINE SURGERY  09/27/2003   C6-67 servical fusion  . COLONOSCOPY WITH PROPOFOL N/A 05/18/2016   Procedure: COLONOSCOPY WITH PROPOFOL;  Surgeon: Garlan Fair, MD;  Location: [facility];  Service: Endoscopy;  Laterality: N/A;  . CORONARY ARTERY BYPASS GRAFT N/A 05/21/2014   Procedure: CORONARY ARTERY BYPASS GRAFTING (CABG);  Surgeon: Gaye Pollack, MD;  Location: [facility];  Service: Open Heart Surgery;  Laterality: N/A;  . CYSTECTOMY  1998   fibroid cysts  . DILATION AND CURETTAGE OF UTERUS  1976   2nd to miscarriage  . EYE SURGERY    . INTRAOPERATIVE TRANSESOPHAGEAL ECHOCARDIOGRAM N/A 05/21/2014   Procedure: INTRAOPERATIVE TRANSESOPHAGEAL  ECHOCARDIOGRAM;  Surgeon: Gaye Pollack, MD;  Location: [facility] OR;  Service: Open Heart Surgery;  Laterality: N/A;  . LEFT HEART CATHETERIZATION WITH CORONARY ANGIOGRAM N/A 03/08/2014   Procedure: LEFT HEART CATHETERIZATION WITH CORONARY ANGIOGRAM;  Surgeon: Kirubel Aja M Martinique, MD;  Location: [facility] C[facility] LAB;  Service: Cardiovascular;  Laterality: N/A;  . 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 PERCUTANEOUS CORONARY STENT INTERVENTION (PCI-S)  03/08/2014   Procedure: PERCUTANEOUS CORONARY STENT INTERVENTION (PCI-S);  Surgeon: Kamali Sakata M Martinique, MD;  Location: [facility] CATH LAB;  Service: Cardiovascular;;  prox lad  bms    Social History   Social  History  . Marital status: Married    Spouse name: N/A  . Number of children: N/A  . Years of education: N/A   Social History Main Topics  . Smoking status: Never Smoker  . Smokeless tobacco: Never Used  . Alcohol use No     Comment: rare glass of wine  . Drug use: No  . Sexual activity: Not Currently    Birth control/ protection: Post-menopausal   Other Topics Concern  . None   Social History Narrative  . None    Family History  Problem Relation Age of Onset  . Stroke Mother   . Heart attack Mother   . Heart disease Mother   . Peripheral vascular disease Father     had stent in leg  . Colon cancer Father   . Atrial fibrillation Father     with pacemaker  . Thyroid disease Father   . Cancer Father   . Hearing loss Father   . Hypertension Father   . Pancreatic cancer Brother   . Cancer Brother   . Hypertension Brother   . Coronary artery disease Brother     with Stent  . Cancer Brother   . Hypertension Brother   . Coronary artery disease Brother     with CABG in his 41's  . Hypertension Brother   . Thyroid disease Brother   . Hypertension Brother   . Thyroid disease Sister     Review of Systems: As noted in HPI.  All other systems were reviewed and are negative.  Physical Exam: BP (!) 120/56    Pulse (!) 49   Ht 5' 5.5" (1.664 m)   Wt 154 lb 6.4 oz (70 kg)   BMI 25.30 kg/m  Filed Weights   08/12/16 1627  Weight: 154 lb 6.4 oz (70 kg)  GENERAL:  Well appearing WF in NAD HEENT:  PERRL, EOMI, sclera are clear. Oropharynx is clear. NECK:  No jugular venous distention, carotid upstroke brisk and symmetric, no bruits, no thyromegaly or adenopathy LUNGS:  Clear to auscultation bilaterally CHEST:  Unremarkable, sternotomy scar has healed well. HEART:  RRR,  PMI not displaced or sustained,S1 and S2 within normal limits, no S3, no S4: no clicks, no rubs, no murmurs ABD:  Soft, nontender. BS +, no masses or bruits. No hepatomegaly, no splenomegaly EXT:  2 + pulses throughout, no edema, no cyanosis no clubbing SKIN:  Warm and dry.  No rashes NEURO:  Alert and oriented x 3. Cranial nerves II through XII intact. PSYCH:  Cognitively intact    LABORATORY DATA: Lab Results  Component Value Date   WBC 5.9 07/14/2016   HGB 13.3 07/14/2016   HCT 41.4 07/14/2016   PLT 206 07/14/2016   GLUCOSE 83 07/14/2016   CHOL 179 07/14/2016   TRIG 125 07/14/2016   HDL 63 07/14/2016   LDLCALC 91 07/14/2016   ALT 19 07/14/2016   AST 21 07/14/2016   NA 140 07/14/2016   K 5.0 07/14/2016   CL 106 07/14/2016   CREATININE 0.69 07/14/2016   BUN 18 07/14/2016   CO2 29 07/14/2016   TSH 2.50 01/03/2016   INR 1.46 05/21/2014   HGBA1C 6.1 (H) 05/17/2014   Myoview 07/10/15: Study Highlights    Nuclear stress EF: 49%.  The left ventricular ejection fraction is mildly decreased (45-54%).  There was no ST segment deviation noted during stress.  Defect 1: There is a defect present in the mid anteroseptal, mid inferolateral, apical septal, apical lateral  and apex location.  Findings consistent with prior myocardial infarction.  This is a low risk study.   1. Low risk study 2. Scar antero septal, apical and inferolateral. 3. Low nl EF    Ecg today shows sinus brady with rate 49. Nonspecific ST  abnormality. I have personally reviewed and interpreted this study.   Assessment / Plan: 1. CAD s/p anterior STEMI in Sept. 2015 treated with BMS of proximal LAD. S/p CABG November 2015.   Continue ASA.  Stress Myoview in January 2017 showed no ischemia and improved EF 49%. Continue medical therapy.  2. Ischemic cardiomyopathy with chronic systolic CHF. EF 35-40% by last Echo but improved to 49% by Myoview. Well compensated. On lasix and losartan. Coreg and aldactone held due to hypotension. Still reports low BP at Rehab at times.  I don't think she will tolerate beta blocker due to resting bradycardia although HR responds well with exercise.  Will continue current therapy  3. Hyperlipidemia. Intolerance to high dose lipitor due to transaminitis. Tolerating Crestor well with good response. LFTs normal. LDL 91.   Follow up in 6 months.

## 2016-08-12 NOTE — Patient Instructions (Addendum)
Continue your current therapy  I will see you in 6 months.   

## 2016-09-02 ENCOUNTER — Telehealth: Payer: Self-pay | Admitting: Cardiology

## 2016-09-02 MED ORDER — FUROSEMIDE 20 MG PO TABS: ORAL_TABLET | ORAL | 3 refills | Status: DC

## 2016-09-02 NOTE — Telephone Encounter (Signed)
Returned call to patient she stated she takes lasix 20 mg daily some days she may take 2 tablets.Stated she needed a new prescription sent to pharmacy.Prescription sent to pharmacy.

## 2016-09-02 NOTE — Telephone Encounter (Signed)
Please call,concerning her Furosemide. °

## 2016-09-03 ENCOUNTER — Telehealth: Payer: Self-pay | Admitting: *Deleted

## 2016-09-03 NOTE — Telephone Encounter (Signed)
Received request from pharmacy for [facility] on Lunesta.   Patient has tried and failed: Melatonin Trazodone Ambien  PA submitted.   Dx: Insomnia.   This request is still being processed. You may close this dialog, return to your dashboard, and perform other tasks. To check for an update later, open this request again from your dashboard. If you have any questions please contact [facility] at [phone].

## 2016-09-04 NOTE — Telephone Encounter (Signed)
Received PA determination.   PA approved 09/03/2016- 07/05/2017.  Pharmacy made aware.

## 2016-09-08 DIAGNOSIS — D225 Melanocytic nevi of trunk: Secondary | ICD-10-CM | POA: Diagnosis not present

## 2016-09-08 DIAGNOSIS — L821 Other seborrheic keratosis: Secondary | ICD-10-CM | POA: Diagnosis not present

## 2016-09-08 DIAGNOSIS — Z23 Encounter for immunization: Secondary | ICD-10-CM | POA: Diagnosis not present

## 2016-09-08 DIAGNOSIS — Z86018 Personal history of other benign neoplasm: Secondary | ICD-10-CM | POA: Diagnosis not present

## 2016-11-27 ENCOUNTER — Other Ambulatory Visit: Payer: Self-pay | Admitting: Cardiology

## 2016-12-15 ENCOUNTER — Other Ambulatory Visit: Payer: Self-pay | Admitting: Cardiology

## 2016-12-27 ENCOUNTER — Other Ambulatory Visit: Payer: Self-pay | Admitting: Cardiology

## 2017-01-18 ENCOUNTER — Other Ambulatory Visit: Payer: Self-pay | Admitting: Cardiology

## 2017-03-15 ENCOUNTER — Other Ambulatory Visit: Payer: Self-pay | Admitting: Family Medicine

## 2017-03-15 NOTE — Telephone Encounter (Signed)
Last OV 07/14/2016 Last refill 07/14/2016 Okay to refill

## 2017-03-15 NOTE — Telephone Encounter (Signed)
rx called into pharmacy

## 2017-03-15 NOTE — Telephone Encounter (Signed)
Give 1 refill due for office visit

## 2017-03-16 ENCOUNTER — Telehealth: Payer: Self-pay

## 2017-03-16 NOTE — Telephone Encounter (Signed)
Patient called and left vm in regards to medication. I called and spoke with patient and she informed me that she had gotten bite by a tick in which she removed. At the time patient was not experiencing any symptoms, I did tell the patient it may take several days to notice any symptoms but to look out for rash near the site, body aches, fever, joint pain. Patient was informed to contact our office if she notice any of theses symptoms

## 2017-03-24 ENCOUNTER — Ambulatory Visit (INDEPENDENT_AMBULATORY_CARE_PROVIDER_SITE_OTHER): Payer: PPO | Admitting: Family Medicine

## 2017-03-24 ENCOUNTER — Encounter: Payer: Self-pay | Admitting: Family Medicine

## 2017-03-24 VITALS — BP 110/74 | HR 54 | Temp 97.8°F | Resp 16 | Ht 65.5 in | Wt 159.0 lb

## 2017-03-24 DIAGNOSIS — N951 Menopausal and female climacteric states: Secondary | ICD-10-CM

## 2017-03-24 DIAGNOSIS — E7800 Pure hypercholesterolemia, unspecified: Secondary | ICD-10-CM

## 2017-03-24 DIAGNOSIS — I10 Essential (primary) hypertension: Secondary | ICD-10-CM | POA: Diagnosis not present

## 2017-03-24 DIAGNOSIS — Z0000 Encounter for general adult medical examination without abnormal findings: Secondary | ICD-10-CM | POA: Diagnosis not present

## 2017-03-24 DIAGNOSIS — I2510 Atherosclerotic heart disease of native coronary artery without angina pectoris: Secondary | ICD-10-CM

## 2017-03-24 DIAGNOSIS — E559 Vitamin D deficiency, unspecified: Secondary | ICD-10-CM

## 2017-03-24 DIAGNOSIS — Z23 Encounter for immunization: Secondary | ICD-10-CM

## 2017-03-24 DIAGNOSIS — J3089 Other allergic rhinitis: Secondary | ICD-10-CM | POA: Diagnosis not present

## 2017-03-24 DIAGNOSIS — R001 Bradycardia, unspecified: Secondary | ICD-10-CM | POA: Diagnosis not present

## 2017-03-24 DIAGNOSIS — J309 Allergic rhinitis, unspecified: Secondary | ICD-10-CM | POA: Insufficient documentation

## 2017-03-24 MED ORDER — ESZOPICLONE 1 MG PO TABS: ORAL_TABLET | ORAL | 2 refills | Status: DC

## 2017-03-24 NOTE — Patient Instructions (Addendum)
Bone density to be done We will call with lab results  Continue current medications  Use flonase and loratadine for the drainage F/U 6 months

## 2017-03-24 NOTE — Progress Notes (Signed)
Subjective:   Patient presents for Medicare Annual/Subsequent preventive examination.  Patient here for a wellness exam. In general she feels that she's been doing well. She has had some sinus drainage and down the back of her throat gets his on and off for a while now. She's not had any fever no pain. She's been trying to blow it out it is always clear. She's not tried the loratadine or any other allergy medications. She was concerned because of her heart. She will occasionally get a sore in her nose when she blows. Medications reviewed she is follow with her cardiologist next month  Chronic insomnia she still uses the [place] a few times a week. She typically gets about 6-1/2 hours to sleep however her to watch which she showed me today shows that she has a lot of restless sleep,usually about an hour of that.  She has gained some weight over the past few months. States that she has not been eating very healthy as her husband is often away for work she will grab quick things and sandwiches.   Review Past Medical/Family/Social: Per EMR    Risk Factors  Current exercise habits: per EMR  Dietary issues discussed: yes  Cardiac risk factors: Obesity (BMI >= 30 kg/m2).   Depression Screen  (Note: if answer to either of the following is "Yes", a more complete depression screening is indicated)  Over the past two weeks, have you felt down, depressed or hopeless? No Over the past two weeks, have you felt little interest or pleasure in doing things? No Have you lost interest or pleasure in daily life? No Do you often feel hopeless? No Do you cry easily over simple problems? No   Activities of Daily Living  In your present state of health, do you have any difficulty performing the following activities?:  Driving? No  Managing money? No  Feeding yourself? No  Getting from bed to chair? No  Climbing a flight of stairs? No  Preparing food and eating?: No  Bathing or showering? No  Getting  dressed: No  Getting to the toilet? No  Using the toilet:No  Moving around from place to place: No  In the past year have you fallen or had a near fall?:No  Are you sexually active? No  Do you have more than one partner? No   Hearing Difficulties: Decreased hearing left ear  Do you often ask people to speak up or repeat themselves? No  Do you experience ringing or noises in your ears? No Do you have difficulty understanding soft or whispered voices? Yes  Do you feel that you have a problem with memory? Yes Do you often misplace items? No  Do you feel safe at home? Yes  Cognitive Testing  Alert? Yes Normal Appearance?Yes  Oriented to person? Yes Place? Yes  Time? Yes  Recall of three objects? Yes  Can perform simple calculations? Yes  Displays appropriate judgment?Yes  Can read the correct time from a watch face?Yes   List the Names of Other Physician/Practitioners you currently use:   Cardiology /GI   Screening Tests / Date Colonoscopy   - UTD                   Zostavax Declines  Mammogram UTD , has appt for Oct Pneumonia- UTD  Influenza Vaccine -  Tetanus/tdapUTD  ROS: GEN- + fatigue,denies  fever, weight loss,weakness, recent illness HEENT- denies eye drainage, change in vision, nasal discharge, CVS- denies chest pain, palpitations  RESP- denies SOB, cough, wheeze ABD- denies N/V, change in stools, abd pain GU- denies dysuria, hematuria, dribbling, incontinence MSK- denies joint pain, muscle aches, injury Neuro- denies headache, dizziness, syncope, seizure activity  Physical: GEN- NAD, alert and oriented x3 HEENT- PERRL, EOMI, non injected sclera, pink conjunctiva, MMM, oropharynx clear Neck- Supple, no thryomegaly, no bruit , no JVD  CVS- bradycardia no murmur RESP-CTAB ABS-NABS,soft,NT,ND EXT- No edema Pulses- Radial, DP- 2+   Assessment:    Annual wellness medicare exam   Plan:    During the course of the visit the patient was educated and counseled  about appropriate screening and preventive services including:  Screening mammography   Shingles vaccine. Pt declines Flu shot given    Screen Neg for depression. Her score was 7 is mostly related to her fatigue and the fact that she has been eating more than she typically does with the snack food per above. No signs of depression. She does have difficulty with sleep which is chronic. She is on Lunesta but only uses as needed to prevent dependency. Other medications have not worked as well as the past.  Hypertension blood pressure is well-controlled. She has chronic bradycardia is not on any rate lowering medications. She is asymptomatic falls up with her cardiologist.  Allergic rhinitis she can use the Flonase or nasal saline she can also safely use loratadine  Bone density to be done  She has living will     Diet review for nutrition referral? Yes ____ Not Indicated __x__  Patient Instructions (the written plan) was given to the patient.  Medicare Attestation  I have personally reviewed:  The patient's medical and social history  Their use of alcohol, tobacco or illicit drugs  Their current medications and supplements  The patient's functional ability including ADLs,fall risks, home safety risks, cognitive, and hearing and visual impairment  Diet and physical activities  Evidence for depression or mood disorders  The patient's weight, height, BMI, and visual acuity have been recorded in the chart. I have made referrals, counseling, and provided education to the patient based on review of the above and I have provided the patient with a written personalized care plan for preventive services.

## 2017-03-25 LAB — LIPID PANEL
CHOLESTEROL: 154 mg/dL (ref <200)
HDL: 64 mg/dL (ref >50)
LDL Cholesterol (Calc): 73 mg/dL (calc)
Non-HDL Cholesterol (Calc): 90 mg/dL (calc) (ref <130)
Total CHOL/HDL Ratio: 2.4 (calc) (ref <5.0)
Triglycerides: 83 mg/dL (ref <150)

## 2017-03-25 LAB — CBC WITH DIFFERENTIAL/PLATELET
BASOS PCT: 0.4 %
Basophils Absolute: 19 cells/uL (ref 0–200)
Eosinophils Absolute: 0 cells/uL — ABNORMAL LOW (ref 15–500)
Eosinophils Relative: 0 %
HEMATOCRIT: 41.5 % (ref 35.0–45.0)
Hemoglobin: 14 g/dL (ref 11.7–15.5)
Lymphs Abs: 1267 cells/uL (ref 850–3900)
MCH: 31.2 pg (ref 27.0–33.0)
MCHC: 33.7 g/dL (ref 32.0–36.0)
MCV: 92.4 fL (ref 80.0–100.0)
MPV: 11 fL (ref 7.5–12.5)
Monocytes Relative: 9.9 %
NEUTROS PCT: 63.3 %
Neutro Abs: 3038 cells/uL (ref 1500–7800)
PLATELETS: 185 10*3/uL (ref 140–400)
RBC: 4.49 10*6/uL (ref 3.80–5.10)
RDW: 12.3 % (ref 11.0–15.0)
TOTAL LYMPHOCYTE: 26.4 %
WBC: 4.8 10*3/uL (ref 3.8–10.8)
WBCMIX: 475 {cells}/uL (ref 200–950)

## 2017-03-25 LAB — COMPREHENSIVE METABOLIC PANEL
AG Ratio: 1.7 (calc) (ref 1.0–2.5)
ALBUMIN MSPROF: 4.2 g/dL (ref 3.6–5.1)
ALKALINE PHOSPHATASE (APISO): 104 U/L (ref 33–130)
ALT: 10 U/L (ref 6–29)
AST: 15 U/L (ref 10–35)
BILIRUBIN TOTAL: 0.5 mg/dL (ref 0.2–1.2)
BUN: 17 mg/dL (ref 7–25)
CALCIUM: 9.9 mg/dL (ref 8.6–10.4)
CHLORIDE: 108 mmol/L (ref 98–110)
CO2: 24 mmol/L (ref 20–32)
Creat: 0.74 mg/dL (ref 0.60–0.93)
GLOBULIN: 2.5 g/dL (ref 1.9–3.7)
Glucose, Bld: 98 mg/dL (ref 65–99)
POTASSIUM: 4.5 mmol/L (ref 3.5–5.3)
Sodium: 141 mmol/L (ref 135–146)
Total Protein: 6.7 g/dL (ref 6.1–8.1)

## 2017-03-25 LAB — VITAMIN D 25 HYDROXY (VIT D DEFICIENCY, FRACTURES): Vit D, 25-Hydroxy: 41 ng/mL (ref 30–100)

## 2017-03-26 ENCOUNTER — Encounter: Payer: Self-pay | Admitting: Family Medicine

## 2017-04-02 NOTE — Progress Notes (Signed)
Kelli Roy Date of Birth: 1945/12/18 Medical Record #[id number]  History of Present Illness: Kelli Roy is seen today for follow up of CAD and CHF. She presented in September 2015 with an Anterior STEMI. She underwent emergent stenting of the proximal LAD occlusion with a BMS. She also had 50-70% left main disease, 70-80% ostial LAD disease and 50-70% ostial LCx disease. EF was 25-30%. She was placed on high dose lipitor therapy but  was noted to have marked elevation of her transaminases and her statin was discontinued with improvement. She has been able to tolerate Crestor. In November 2015 she returned for CABG by Dr. Cyndia Bent with LIMA to the LAD, SVG to diag, SVG to OM, and SVG to RCA. She also developed a rash to both Brilinta and Plavix.   She had repeat Echo in February 2016 showed an EF of 35-40% with akinesis of the antero-septum and apex. In March 2016 she was admitted with acute CHF. She was diuresed with IV lasix and was DC home with addition of lasix and aldactone. She later developed hypotension and Coreg and aldactone were held. In January 2017 she had a stress myoview. This showed scar in the anteroseptal, apical, and inferolateral segments without ischemia. EF improved to 49%.   On follow up today she is doing well. She states she just eats too much and has gained 6 lbs.  She denies any chest pain or SOB. No palpitations. She is doing maintenance Rehab twice a week and reports BP there has been normal if not low. Energy level is good.  Allergies as of 04/07/2017      Reactions   Carvedilol Shortness Of Breath   Spironolactone Shortness Of Breath   Demerol [meperidine] Nausea Only   Lipitor [atorvastatin] Itching   Lisinopril Cough   Meperidine Hcl Nausea Only   Plavix [clopidogrel Bisulfate]    Rash   Dexilant [dexlansoprazole] Rash   Latex Rash   Neomycin Rash   Sulfonamide Derivatives Rash   Ticagrelor Itching, Rash   Brilinta      Medication List       Accurate as  of 04/07/17 10:57 AM. Always use your most recent med list.          acetaminophen 500 MG tablet Commonly known as:  TYLENOL Take 500 mg by mouth every 6 (six) hours as needed for moderate pain or headache.   aspirin EC 81 MG tablet Take 81 mg by mouth at bedtime.   docusate sodium 100 MG capsule Commonly known as:  COLACE Take 100-200 mg by mouth 2 (two) times daily. Take 100mg s in the morning and 200mg s at night   eszopiclone 1 MG Tabs tablet Commonly known as:  LUNESTA TAKE HALF A TABLET BY MOUTH AT BEDTIME AS NEEDED   furosemide 20 MG tablet Commonly known as:  LASIX Take 1 to 2 tablets daily   losartan 50 MG tablet Commonly known as:  COZAAR TAKE 1 TABLET (50 MG TOTAL) BY MOUTH DAILY.   nitroGLYCERIN 0.4 MG SL tablet Commonly known as:  NITROSTAT PLACE 1 TABLET (0.4 MG TOTAL) UNDER THE TONGUE EVERY 5 (FIVE) MINUTES AS NEEDED FOR CHEST PAIN.   QUNOL ULTRA COQ10 PO Take by mouth daily. 2 teaspoons   rosuvastatin 10 MG tablet Commonly known as:  CRESTOR TAKE 1 TABLET (10 MG TOTAL) BY MOUTH DAILY.   rosuvastatin 10 MG tablet Commonly known as:  CRESTOR TAKE 1 TABLET (10 MG TOTAL) BY MOUTH DAILY.   tetrahydrozoline-zinc 0.05-0.25 % ophthalmic  solution Commonly known as:  VISINE-AC Place 2 drops into both eyes daily as needed (Dry eyes).   VITAMIN B 12 PO Take by mouth.   VITAMIN D3 PO Take by mouth.        Allergies  Allergen Reactions  . Carvedilol Shortness Of Breath  . Spironolactone Shortness Of Breath  . Demerol [Meperidine] Nausea Only  . Lipitor [Atorvastatin] Itching  . Lisinopril Cough  . Meperidine Hcl Nausea Only  . Plavix [Clopidogrel Bisulfate]     Rash   . Dexilant [Dexlansoprazole] Rash  . Latex Rash  . Neomycin Rash  . Sulfonamide Derivatives Rash  . Ticagrelor Itching and Rash    Brilinta     Past Medical History:  Diagnosis Date  . Allergy   . Alopecia   . Arthritis   . CAD (coronary artery disease)    a. 03/2014 Ant  STEMI/PCI: LM 50-70d, LAD 70-80ost, 100p (2.75x20 Veriflex BMS), LCX 50-70ost, 40-50p, RCA dom - 50p/m, EF 25-30%, mid ant/dist inf AK, dist ant/apical DK, mod MR.; s/p CABG x 4 05/2015 with LIMA to LAD, SVG to DX, SVG to OM and SVG to RCA per Dr. Cyndia Bent   . Cataract   . Chronic systolic CHF (congestive heart failure) ([place])    a. 03/2014 Echo: EF 25-30%; Pre op TEE 05/2014 with EF of 50%  . Family history of cardiovascular disease   . Family history of colon cancer   . History of long-term treatment with high-risk medication   . Hyperlipidemia   . Hypertension   . Insomnia   . Ischemic cardiomyopathy    a. 03/2014 Echo: EF 25-30%, mid-apical/anteroseptal and inf AK, Gr 2 DD, ? apical thrombus, Mild MR.-->Discharged with LifeVest.  . Malaise and fatigue   . Myocardial infarction ([place]) 03/2014  . Shortness of breath dyspnea   . Sinus headache     Past Surgical History:  Procedure Laterality Date  . CARDIAC CATHETERIZATION  2001   showed no blockages  . CATARACT EXTRACTION W/PHACO Left 01/04/2014   Procedure: CATARACT EXTRACTION PHACO AND INTRAOCULAR LENS PLACEMENT (IOC);  Surgeon: Tonny Branch, MD;  Location: [facility];  Service: Ophthalmology;  Laterality: Left;  CDE:5.65  . CATARACT EXTRACTION W/PHACO Right 01/29/2014   Procedure: CATARACT EXTRACTION PHACO AND INTRAOCULAR LENS PLACEMENT RIGHT EYE CDE=4.49;  Surgeon: Tonny Branch, MD;  Location: [facility];  Service: Ophthalmology;  Laterality: Right;  . CERVICAL SPINE SURGERY  09/27/2003   C6-67 servical fusion  . COLONOSCOPY WITH PROPOFOL N/A 05/18/2016   Procedure: COLONOSCOPY WITH PROPOFOL;  Surgeon: Garlan Fair, MD;  Location: [facility];  Service: Endoscopy;  Laterality: N/A;  . CORONARY ARTERY BYPASS GRAFT N/A 05/21/2014   Procedure: CORONARY ARTERY BYPASS GRAFTING (CABG);  Surgeon: Gaye Pollack, MD;  Location: [facility];  Service: Open Heart Surgery;  Laterality: N/A;  . CYSTECTOMY  1998   fibroid cysts  . DILATION AND CURETTAGE OF  UTERUS  1976   2nd to miscarriage  . EYE SURGERY    . INTRAOPERATIVE TRANSESOPHAGEAL ECHOCARDIOGRAM N/A 05/21/2014   Procedure: INTRAOPERATIVE TRANSESOPHAGEAL ECHOCARDIOGRAM;  Surgeon: Gaye Pollack, MD;  Location[facility];  Service: Open Heart Surgery;  Laterality: N/A;  . LEFT HEART CATHETERIZATION WITH CORONARY ANGIOGRAM N/A 03/08/2014   Procedure: LEFT HEART CATHETERIZATION WITH CORONARY ANGIOGRAM;  Surgeon: Hason Ofarrell M Martinique, MD;  Location: [facility] CATH LAB;  Service: Cardiovascular;  Laterality: N/A;  . 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  PERCUTANEOUS CORONARY STENT INTERVENTION (PCI-S)  03/08/2014   Procedure: PERCUTANEOUS CORONARY STENT INTERVENTION (PCI-S);  Surgeon: Jasiri Hanawalt M Martinique, MD;  Location: [facility] CATH LAB;  Service: Cardiovascular;;  prox lad  bms    Social History   Social History  . Marital status: Married    Spouse name: N/A  . Number of children: N/A  . Years of education: N/A   Social History Main Topics  . Smoking status: Never Smoker  . Smokeless tobacco: Never Used  . Alcohol use No     Comment: rare glass of wine  . Drug use: No  . Sexual activity: Not Currently    Birth control/ protection: Post-menopausal   Other Topics Concern  . None   Social History Narrative  . None    Family History  Problem Relation Age of Onset  . Stroke Mother   . Heart attack Mother   . Heart disease Mother   . Peripheral vascular disease Father        had stent in leg  . Colon cancer Father   . Atrial fibrillation Father        with pacemaker  . Thyroid disease Father   . Cancer Father   . Hearing loss Father   . Hypertension Father   . Pancreatic cancer Brother   . Cancer Brother   . Hypertension Brother   . Coronary artery disease Brother        with Stent  . Cancer Brother   . Hypertension Brother   . Coronary artery disease Brother        with CABG in his 19's  . Hypertension Brother   . Thyroid disease Brother   .  Hypertension Brother   . Thyroid disease Sister     Review of Systems: As noted in HPI.  All other systems were reviewed and are negative.  Physical Exam: BP (!) 158/60   Pulse (!) 48   Ht 5' 5.5" (1.664 m)   Wt 160 lb (72.6 kg)   BMI 26.22 kg/m  Filed Weights   04/07/17 1029  Weight: 160 lb (72.6 kg)  GENERAL:  Well appearing EF in NAD.  HEENT:  PERRL, EOMI, sclera are clear. Oropharynx is clear. NECK:  No jugular venous distention, carotid upstroke brisk and symmetric, no bruits, no thyromegaly or adenopathy LUNGS:  Clear to auscultation bilaterally CHEST:  Unremarkable HEART:  RRR,  PMI not displaced or sustained,S1 and S2 within normal limits, no S3, no S4: no clicks, no rubs, no murmurs ABD:  Soft, nontender. BS +, no masses or bruits. No hepatomegaly, no splenomegaly EXT:  2 + pulses throughout, no edema, no cyanosis no clubbing SKIN:  Warm and dry.  No rashes NEURO:  Alert and oriented x 3. Cranial nerves II through XII intact. PSYCH:  Cognitively intact      LABORATORY DATA: Lab Results  Component Value Date   WBC 4.8 03/24/2017   HGB 14.0 03/24/2017   HCT 41.5 03/24/2017   PLT 185 03/24/2017   GLUCOSE 98 03/24/2017   CHOL 154 03/24/2017   TRIG 83 03/24/2017   HDL 64 03/24/2017   LDLCALC 91 07/14/2016   ALT 10 03/24/2017   AST 15 03/24/2017   NA 141 03/24/2017   K 4.5 03/24/2017   CL 108 03/24/2017   CREATININE 0.74 03/24/2017   BUN 17 03/24/2017   CO2 24 03/24/2017   TSH 2.50 01/03/2016   INR 1.46 05/21/2014   HGBA1C 6.1 (H) 05/17/2014   Myoview  07/10/15: Study Highlights    Nuclear stress EF: 49%.  The left ventricular ejection fraction is mildly decreased (45-54%).  There was no ST segment deviation noted during stress.  Defect 1: There is a defect present in the mid anteroseptal, mid inferolateral, apical septal, apical lateral and apex location.  Findings consistent with prior myocardial infarction.  This is a low risk study.   1. Low  risk study 2. Scar antero septal, apical and inferolateral. 3. Low nl EF    Ecg today shows sinus brady with rate 48. Old septal infarct.  I have personally reviewed and interpreted this study.   Assessment / Plan: 1. CAD s/p anterior STEMI in Sept. 2015 treated with BMS of proximal LAD. S/p CABG November 2015.   Continue ASA.  Stress Myoview in January 2017 showed no ischemia and improved EF 49%. She is asymptomatic.  2. Ischemic cardiomyopathy with chronic systolic CHF. EF 35-40% by last Echo but improved to 49% by Myoview. Well compensated. On lasix and losartan. Coreg and aldactone held in the past due to hypotension. Also not a candidate for beta blocker due to bradycardia.   Will continue current therapy  3. Hyperlipidemia. Intolerance to high dose lipitor due to transaminitis. Tolerating Crestor well with good response. LFTs normal. LDL 73.   4. Elevated BP - this is unusual for her. No changes made today and we will monitor at Rehab.   Follow up in 6 months.

## 2017-04-07 ENCOUNTER — Encounter: Payer: Self-pay | Admitting: Cardiology

## 2017-04-07 ENCOUNTER — Ambulatory Visit (INDEPENDENT_AMBULATORY_CARE_PROVIDER_SITE_OTHER): Payer: PPO | Admitting: Cardiology

## 2017-04-07 VITALS — BP 158/60 | HR 48 | Ht 65.5 in | Wt 160.0 lb

## 2017-04-07 DIAGNOSIS — I2510 Atherosclerotic heart disease of native coronary artery without angina pectoris: Secondary | ICD-10-CM | POA: Diagnosis not present

## 2017-04-07 DIAGNOSIS — I10 Essential (primary) hypertension: Secondary | ICD-10-CM | POA: Diagnosis not present

## 2017-04-07 DIAGNOSIS — Z951 Presence of aortocoronary bypass graft: Secondary | ICD-10-CM | POA: Diagnosis not present

## 2017-04-07 DIAGNOSIS — E7800 Pure hypercholesterolemia, unspecified: Secondary | ICD-10-CM

## 2017-04-07 DIAGNOSIS — I5022 Chronic systolic (congestive) heart failure: Secondary | ICD-10-CM | POA: Diagnosis not present

## 2017-04-07 NOTE — Patient Instructions (Signed)
Continue your current therapy  I will see you in 6 months.   

## 2017-04-07 NOTE — Addendum Note (Signed)
Addended by: Kathyrn Lass on: 04/07/2017 11:10 AM   Modules accepted: Orders

## 2017-04-21 DIAGNOSIS — Z1231 Encounter for screening mammogram for malignant neoplasm of breast: Secondary | ICD-10-CM | POA: Diagnosis not present

## 2017-04-21 DIAGNOSIS — M8589 Other specified disorders of bone density and structure, multiple sites: Secondary | ICD-10-CM | POA: Diagnosis not present

## 2017-04-21 LAB — HM MAMMOGRAPHY

## 2017-04-21 LAB — HM DEXA SCAN

## 2017-04-22 ENCOUNTER — Encounter: Payer: Self-pay | Admitting: Family Medicine

## 2017-04-29 ENCOUNTER — Encounter: Payer: Self-pay | Admitting: Family Medicine

## 2017-04-29 DIAGNOSIS — M8580 Other specified disorders of bone density and structure, unspecified site: Secondary | ICD-10-CM | POA: Insufficient documentation

## 2017-05-04 ENCOUNTER — Encounter: Payer: Self-pay | Admitting: *Deleted

## 2017-06-21 ENCOUNTER — Other Ambulatory Visit: Payer: Self-pay

## 2017-06-21 MED ORDER — ROSUVASTATIN CALCIUM 10 MG PO TABS: ORAL_TABLET | ORAL | 1 refills | Status: DC

## 2017-07-29 ENCOUNTER — Encounter: Payer: Self-pay | Admitting: Physician Assistant

## 2017-07-29 ENCOUNTER — Other Ambulatory Visit: Payer: Self-pay

## 2017-07-29 ENCOUNTER — Ambulatory Visit (INDEPENDENT_AMBULATORY_CARE_PROVIDER_SITE_OTHER): Payer: PPO | Admitting: Physician Assistant

## 2017-07-29 VITALS — BP 144/72 | HR 69 | Temp 97.8°F | Resp 16 | Ht 65.5 in | Wt 162.8 lb

## 2017-07-29 DIAGNOSIS — H1032 Unspecified acute conjunctivitis, left eye: Secondary | ICD-10-CM

## 2017-07-29 MED ORDER — POLYMYXIN B-TRIMETHOPRIM 10000-0.1 UNIT/ML-% OP SOLN: 2.0000 [drp] | OPHTHALMIC | 0 refills | Status: AC

## 2017-07-29 NOTE — Progress Notes (Signed)
Patient ID: Kelli Roy MRN: [id number], DOB: [birth year], 72 y.o. Date of Encounter: 07/29/2017, 10:14 AM    Chief Complaint:  Chief Complaint  Patient presents with  . left eye oozing and a little red    no pain x1day     HPI: 72 y.o. year old female presents with above.   She states that just last night her left eye was feeling irritated -- " felt like a little dot of sand or something was there". States that this morning at about 4 AM she woke because she noticed that her left eye was matted together and crusty.  Also some mucousy drainage was in the left eye this morning.  Left eye is also look looking a little red compared to the right eye.  So far has had no symptoms or abnormalities with the right eye.  Has not been sick with any nasal drainage, or congestion.  No fevers or chills.     Home Meds:   Outpatient Medications Prior to Visit  Medication Sig Dispense Refill  . acetaminophen (TYLENOL) 500 MG tablet Take 500 mg by mouth every 6 (six) hours as needed for moderate pain or headache.     Marland Kitchen aspirin EC 81 MG tablet Take 81 mg by mouth at bedtime.     . Cholecalciferol (VITAMIN D3 PO) Take 50 mcg by mouth.     . Coenzyme Q10-Vitamin E (QUNOL ULTRA COQ10 PO) Take by mouth daily. 2 teaspoons    . Cyanocobalamin (VITAMIN B 12 PO) Take 1,000 mcg by mouth.     . docusate sodium (COLACE) 100 MG capsule Take 100-200 mg by mouth 2 (two) times daily. Take 100mg s in the morning and 200mg s at night    . eszopiclone (LUNESTA) 1 MG TABS tablet TAKE HALF A TABLET BY MOUTH AT BEDTIME AS NEEDED 30 tablet 2  . furosemide (LASIX) 20 MG tablet Take 1 to 2 tablets daily 180 tablet 3  . losartan (COZAAR) 50 MG tablet TAKE 1 TABLET (50 MG TOTAL) BY MOUTH DAILY. 90 tablet 2  . nitroGLYCERIN (NITROSTAT) 0.4 MG SL tablet PLACE 1 TABLET (0.4 MG TOTAL) UNDER THE TONGUE EVERY 5 (FIVE) MINUTES AS NEEDED FOR CHEST PAIN. 25 tablet 3  . rosuvastatin (CRESTOR) 10 MG tablet TAKE 1 TABLET (10 MG TOTAL)  BY MOUTH DAILY. (Patient taking differently: Take 10mg s daily at night) 90 tablet 1  . tetrahydrozoline-zinc (VISINE-AC) 0.05-0.25 % ophthalmic solution Place 2 drops into both eyes daily as needed (Dry eyes).    . rosuvastatin (CRESTOR) 10 MG tablet TAKE 1 TABLET (10 MG TOTAL) BY MOUTH DAILY. 90 tablet 1   No facility-administered medications prior to visit.     Allergies:  Allergies  Allergen Reactions  . Carvedilol Shortness Of Breath  . Spironolactone Shortness Of Breath  . Demerol [Meperidine] Nausea Only  . Lipitor [Atorvastatin] Itching  . Lisinopril Cough  . Meperidine Hcl Nausea Only  . Plavix [Clopidogrel Bisulfate]     Rash   . Dexilant [Dexlansoprazole] Rash  . Latex Rash  . Neomycin Rash  . Sulfonamide Derivatives Rash  . Ticagrelor Itching and Rash    Brilinta       Review of Systems: See HPI for pertinent ROS. All other ROS negative.    Physical Exam: Blood pressure (!) 144/72, pulse 69, temperature 97.8 F (36.6 C), temperature source Oral, resp. rate 16, height 5' 5.5" (1.664 m), weight 73.8 kg (162 lb 12.8 oz), SpO2 98 %., Body mass index  is 26.68 kg/m. General:  [name]. Appears in no acute distress. HEENT:  Right JOI:NOMVEHMCNO is normal. Left Eye: Mild diffuse conjunctival injection. Neck: Supple. No thyromegaly. No lymphadenopathy. Lungs: Clear bilaterally to auscultation without wheezes, rales, or rhonchi. Breathing is unlabored. Heart: Regular rhythm. No murmurs, rubs, or gallops. Msk:  Strength and tone normal for age. Extremities/Skin: Warm and dry.  Neuro: Alert and oriented X 3. Moves all extremities spontaneously. Gait is normal. CNII-XII grossly in tact. Psych:  Responds to questions appropriately with a normal affect.     ASSESSMENT AND PLAN:  72 y.o. year old female with  1. Acute conjunctivitis of left eye, unspecified acute conjunctivitis type She is to apply eyedrops as directed.  Also discussed proper hygiene.  If her eye feels  extremely itchy and she absolutely has to touch it, then afterwards needs to wash with soap.  Also if she does have mucousy drainage and has to clean the eye, then afterward needs to wash with soapy water.  Otherwise avoid touching the area anywhere around the eyes unless absolutely necessary.  If she does start to develop same type of symptoms with the right eye then start to apply the drops to the right as well.  Follow up if symptoms worsen or are not resolving within 1 week. - trimethoprim-polymyxin b (POLYTRIM) ophthalmic solution; Place 2 drops into the left eye every 4 (four) hours for 5 days.  Dispense: 10 mL; Refill: 0   Signed, [address] [place], [place], [facility] 07/29/2017 10:14 AM

## 2017-08-03 ENCOUNTER — Telehealth: Payer: Self-pay | Admitting: *Deleted

## 2017-08-03 MED ORDER — CIPROFLOXACIN HCL 0.3 % OP OINT: TOPICAL_OINTMENT | OPHTHALMIC | 0 refills | Status: DC

## 2017-08-03 NOTE — Telephone Encounter (Signed)
Received call from patient.   Reports that she was seen by PA for conjunctivitis. States that she has been using Polytrim eye drops. Reports that eye irritation has worsened and requested MD to advise if there is another medication she can try.   MD please advise.

## 2017-08-03 NOTE — Telephone Encounter (Signed)
Call placed to patient. Vayas.   Prescription sent to pharmacy.

## 2017-08-03 NOTE — Telephone Encounter (Signed)
Ciloxan  Or Cipro optic 2 drops Four times a day for 5 days If not improving by Friday needs OV

## 2017-08-04 NOTE — Telephone Encounter (Signed)
Patient returned call and made aware.   Reports that pharmacy contacted her to inform her that medication is not covered by her insurance and is quite expensive.   Call placed to pharmacy to inquire about alternatives. Advised that ointment was sent to pharmacy instead of drops. VO given to change prescription to drops. Insurance will cover with $0 co-pay, but medication is not in stock. Pharmacy will order and patient can pick up on 08/05/2017.  Call placed to patient. [address].

## 2017-08-04 NOTE — Telephone Encounter (Signed)
Patient returned call and made aware.

## 2017-09-08 DIAGNOSIS — L720 Epidermal cyst: Secondary | ICD-10-CM | POA: Diagnosis not present

## 2017-09-08 DIAGNOSIS — Z86018 Personal history of other benign neoplasm: Secondary | ICD-10-CM | POA: Diagnosis not present

## 2017-09-08 DIAGNOSIS — D225 Melanocytic nevi of trunk: Secondary | ICD-10-CM | POA: Diagnosis not present

## 2017-09-08 DIAGNOSIS — Z23 Encounter for immunization: Secondary | ICD-10-CM | POA: Diagnosis not present

## 2017-09-08 DIAGNOSIS — L821 Other seborrheic keratosis: Secondary | ICD-10-CM | POA: Diagnosis not present

## 2017-09-21 ENCOUNTER — Encounter: Payer: Self-pay | Admitting: Family Medicine

## 2017-09-21 ENCOUNTER — Other Ambulatory Visit: Payer: Self-pay

## 2017-09-21 ENCOUNTER — Ambulatory Visit (INDEPENDENT_AMBULATORY_CARE_PROVIDER_SITE_OTHER): Payer: PPO | Admitting: Family Medicine

## 2017-09-21 VITALS — BP 136/68 | HR 48 | Temp 98.1°F | Resp 16 | Ht 65.5 in | Wt 161.0 lb

## 2017-09-21 DIAGNOSIS — K5904 Chronic idiopathic constipation: Secondary | ICD-10-CM | POA: Diagnosis not present

## 2017-09-21 DIAGNOSIS — J339 Nasal polyp, unspecified: Secondary | ICD-10-CM | POA: Diagnosis not present

## 2017-09-21 DIAGNOSIS — I2510 Atherosclerotic heart disease of native coronary artery without angina pectoris: Secondary | ICD-10-CM | POA: Diagnosis not present

## 2017-09-21 DIAGNOSIS — I10 Essential (primary) hypertension: Secondary | ICD-10-CM | POA: Diagnosis not present

## 2017-09-21 DIAGNOSIS — J3089 Other allergic rhinitis: Secondary | ICD-10-CM | POA: Diagnosis not present

## 2017-09-21 DIAGNOSIS — F5101 Primary insomnia: Secondary | ICD-10-CM | POA: Diagnosis not present

## 2017-09-21 MED ORDER — ESZOPICLONE 1 MG PO TABS: ORAL_TABLET | ORAL | 2 refills | Status: DC

## 2017-09-21 NOTE — Assessment & Plan Note (Signed)
She is asymptomatic.  Controlling her risk factors.  She will see cardiology next month and have labs with them as needed.

## 2017-09-21 NOTE — Assessment & Plan Note (Signed)
Chronic rhinitis as well as nasal congestion.  Will send to ENT for consultation

## 2017-09-21 NOTE — Patient Instructions (Addendum)
Referral to Dr. Vicie Mutters Try the miralax F/U 6 months Physical

## 2017-09-21 NOTE — Assessment & Plan Note (Signed)
we will refill the Lunesta that she gets benefit out of this.  She is tried multiple over-the-counter as well as prescription medications

## 2017-09-21 NOTE — Progress Notes (Signed)
   Subjective:    Patient ID: Kelli Roy, female    DOB: September 10, 1945, 72 y.o.   MRN: [id number]  Patient presents for Follow-up (is not fasting)   CHronic insomnia- out of lunesta , has tried multiple other sleep meds , needs refill   CAD-followed by cardiology, no chest pain or SOB, continues in cardiac rehab, lipids at goal last check, has f/u next month   HTn- tolerating losartan, no Side effects    Continues to have problems with her nasal congestion, polyps, has tried nasal raise.  Antihistamines.  She would like to proceed with consultation with ENT   Review Of Systems:  GEN- denies fatigue, fever, weight loss,weakness, recent illness HEENT- denies eye drainage, change in vision, nasal discharge, CVS- denies chest pain, palpitations RESP- denies SOB, cough, wheeze ABD- denies N/V, change in stools, abd pain GU- denies dysuria, hematuria, dribbling, incontinence MSK- denies joint pain, muscle aches, injury Neuro- denies headache, dizziness, syncope, seizure activity       Objective:    BP 136/68   Pulse (!) 48   Temp 98.1 F (36.7 C) (Oral)   Resp 16   Ht 5' 5.5" (1.664 m)   Wt 161 lb (73 kg)   SpO2 95%   BMI 26.38 kg/m  GEN- NAD, alert and oriented x3 HEENT- PERRL, EOMI, non injected sclera, pink conjunctiva, MMM, oropharynx clear,+ large turbinates Neck- Supple, no thyromegaly CVS- bradycardia no murmur RESP-CTAB ABD-NABS,soft,NT,ND EXT- No edema Pulses- Radial, DP- 2+        Assessment & Plan:      Problem List Items Addressed This Visit      Unprioritized   Insomnia - Primary     we will refill the Lunesta that she gets benefit out of this.  She is tried multiple over-the-counter as well as prescription medications      Essential hypertension    Blood pressure is controlled no changes.      Constipation    End of visit stated still gets constipated Recommend miralax 1 cap full daily as prunes are not helping as previously      CAD  (coronary artery disease), native coronary artery    She is asymptomatic.  Controlling her risk factors.  She will see cardiology next month and have labs with them as needed.      Allergic rhinitis    Chronic rhinitis as well as nasal congestion.  Will send to ENT for consultation      Relevant Orders   Ambulatory referral to ENT    Other Visit Diagnoses    Nasal polyp       Relevant Orders   Ambulatory referral to ENT      Note: This dictation was prepared with Dragon dictation along with smaller phrase technology. Any transcriptional errors that result from this process are unintentional.

## 2017-09-21 NOTE — Assessment & Plan Note (Signed)
Blood pressure is controlled no changes 

## 2017-09-21 NOTE — Assessment & Plan Note (Addendum)
End of visit stated still gets constipated Recommend miralax 1 cap full daily as prunes are not helping as previously

## 2017-09-30 ENCOUNTER — Other Ambulatory Visit: Payer: Self-pay | Admitting: Family Medicine

## 2017-10-01 ENCOUNTER — Other Ambulatory Visit: Payer: Self-pay | Admitting: Family Medicine

## 2017-10-01 NOTE — Telephone Encounter (Signed)
Ok to refill?  Refill was printed on 09/21/2017.

## 2017-10-07 ENCOUNTER — Other Ambulatory Visit: Payer: Self-pay | Admitting: Cardiology

## 2017-10-19 ENCOUNTER — Telehealth: Payer: Self-pay | Admitting: Cardiology

## 2017-10-19 NOTE — Telephone Encounter (Signed)
New Message:    Pt wants to know if she need need lab work before her office visit on Friday(10-22-17)? If so,she would like to have it in [place].Please leave a message on her answering machine if she is not there when you call.

## 2017-10-19 NOTE — Telephone Encounter (Signed)
Returned call to patient.Left message on personal voice mail no lab needed before her appointment.

## 2017-10-21 NOTE — Progress Notes (Deleted)
Kelli Roy Date of Birth: October 19, 1945 Medical Record #[id number]  History of Present Illness: Kelli Roy is seen today for follow up of CAD and CHF. She presented in September 2015 with an Anterior STEMI. She underwent emergent stenting of the proximal LAD occlusion with a BMS. She also had 50-70% left main disease, 70-80% ostial LAD disease and 50-70% ostial LCx disease. EF was 25-30%. She was placed on high dose lipitor therapy but  was noted to have marked elevation of her transaminases and her statin was discontinued with improvement. She has been able to tolerate Crestor. In November 2015 she returned for CABG by Dr. Cyndia Bent with LIMA to the LAD, SVG to diag, SVG to OM, and SVG to RCA. She also developed a rash to both Brilinta and Plavix.   She had repeat Echo in February 2016 showed an EF of 35-40% with akinesis of the antero-septum and apex. In March 2016 she was admitted with acute CHF. She was diuresed with IV lasix and was DC home with addition of lasix and aldactone. She later developed hypotension and Coreg and aldactone were held. In January 2017 she had a stress myoview. This showed scar in the anteroseptal, apical, and inferolateral segments without ischemia. EF improved to 49%.   On follow up today she is doing well. She states she just eats too much and has gained 6 lbs.  She denies any chest pain or SOB. No palpitations. She is doing maintenance Rehab twice a week and reports BP there has been normal if not low. Energy level is good.  Allergies as of 10/22/2017      Reactions   Carvedilol Shortness Of Breath   Spironolactone Shortness Of Breath   Demerol [meperidine] Nausea Only   Lipitor [atorvastatin] Itching   Lisinopril Cough   Meperidine Hcl Nausea Only   Plavix [clopidogrel Bisulfate]    Rash   Dexilant [dexlansoprazole] Rash   Latex Rash   Neomycin Rash   Sulfonamide Derivatives Rash   Ticagrelor Itching, Rash   Brilinta      Medication List        Accurate as  of 10/21/17  7:21 PM. Always use your most recent med list.          acetaminophen 500 MG tablet Commonly known as:  TYLENOL Take 500 mg by mouth every 6 (six) hours as needed for moderate pain or headache.   aspirin EC 81 MG tablet Take 81 mg by mouth at bedtime.   docusate sodium 100 MG capsule Commonly known as:  COLACE Take 100-200 mg by mouth 2 (two) times daily. Take 100mg s in the morning and 200mg s at night   eszopiclone 1 MG Tabs tablet Commonly known as:  LUNESTA TAKE A HALF TABLET AT BEDTIME AS NEEDED   furosemide 20 MG tablet Commonly known as:  LASIX Take 1 to 2 tablets daily   losartan 50 MG tablet Commonly known as:  COZAAR TAKE 1 TABLET BY MOUTH EVERY DAY   nitroGLYCERIN 0.4 MG SL tablet Commonly known as:  NITROSTAT PLACE 1 TABLET (0.4 MG TOTAL) UNDER THE TONGUE EVERY 5 (FIVE) MINUTES AS NEEDED FOR CHEST PAIN.   QUNOL ULTRA COQ10 PO Take by mouth daily. 2 teaspoons   rosuvastatin 10 MG tablet Commonly known as:  CRESTOR TAKE 1 TABLET (10 MG TOTAL) BY MOUTH DAILY.   tetrahydrozoline-zinc 0.05-0.25 % ophthalmic solution Commonly known as:  VISINE-AC Place 2 drops into both eyes daily as needed (Dry eyes).   VITAMIN B 12  PO Take 1,000 mcg by mouth.   VITAMIN D3 PO Take 50 mcg by mouth.        Allergies  Allergen Reactions  . Carvedilol Shortness Of Breath  . Spironolactone Shortness Of Breath  . Demerol [Meperidine] Nausea Only  . Lipitor [Atorvastatin] Itching  . Lisinopril Cough  . Meperidine Hcl Nausea Only  . Plavix [Clopidogrel Bisulfate]     Rash   . Dexilant [Dexlansoprazole] Rash  . Latex Rash  . Neomycin Rash  . Sulfonamide Derivatives Rash  . Ticagrelor Itching and Rash    Brilinta     Past Medical History:  Diagnosis Date  . Allergy   . Alopecia   . Arthritis   . CAD (coronary artery disease)    a. 03/2014 Ant STEMI/PCI: LM 50-70d, LAD 70-80ost, 100p (2.75x20 Veriflex BMS), LCX 50-70ost, 40-50p, RCA dom - 50p/m, EF  25-30%, mid ant/dist inf AK, dist ant/apical DK, mod MR.; s/p CABG x 4 05/2015 with LIMA to LAD, SVG to DX, SVG to OM and SVG to RCA per Dr. Cyndia Bent   . Cataract   . Chronic systolic CHF (congestive heart failure) ([place])    a. 03/2014 Echo: EF 25-30%; Pre op TEE 05/2014 with EF of 50%  . Family history of cardiovascular disease   . Family history of colon cancer   . History of long-term treatment with high-risk medication   . Hyperlipidemia   . Hypertension   . Insomnia   . Ischemic cardiomyopathy    a. 03/2014 Echo: EF 25-30%, mid-apical/anteroseptal and inf AK, Gr 2 DD, ? apical thrombus, Mild MR.-->Discharged with LifeVest.  . Malaise and fatigue   . Myocardial infarction ([place]) 03/2014  . Shortness of breath dyspnea   . Sinus headache     Past Surgical History:  Procedure Laterality Date  . CARDIAC CATHETERIZATION  2001   showed no blockages  . CATARACT EXTRACTION W/PHACO Left 01/04/2014   Procedure: CATARACT EXTRACTION PHACO AND INTRAOCULAR LENS PLACEMENT (IOC);  Surgeon: Tonny Branch, MD;  Location: [facility];  Service: Ophthalmology;  Laterality: Left;  CDE:5.65  . CATARACT EXTRACTION W/PHACO Right 01/29/2014   Procedure: CATARACT EXTRACTION PHACO AND INTRAOCULAR LENS PLACEMENT RIGHT EYE CDE=4.49;  Surgeon: Tonny Branch, MD;  Location: [facility];  Service: Ophthalmology;  Laterality: Right;  . CERVICAL SPINE SURGERY  09/27/2003   C6-67 servical fusion  . COLONOSCOPY WITH PROPOFOL N/A 05/18/2016   Procedure: COLONOSCOPY WITH PROPOFOL;  Surgeon: Garlan Fair, MD;  Location: [facility];  Service: Endoscopy;  Laterality: N/A;  . CORONARY ARTERY BYPASS GRAFT N/A 05/21/2014   Procedure: CORONARY ARTERY BYPASS GRAFTING (CABG);  Surgeon: Gaye Pollack, MD;  Location: [place];  Service: Open Heart Surgery;  Laterality: N/A;  . CYSTECTOMY  1998   fibroid cysts  . DILATION AND CURETTAGE OF UTERUS  1976   2nd to miscarriage  . EYE SURGERY    . INTRAOPERATIVE TRANSESOPHAGEAL ECHOCARDIOGRAM N/A  05/21/2014   Procedure: INTRAOPERATIVE TRANSESOPHAGEAL ECHOCARDIOGRAM;  Surgeon: Gaye Pollack, MD;  Location[facility] OR;  Service: Open Heart Surgery;  Laterality: N/A;  . LEFT HEART CATHETERIZATION WITH CORONARY ANGIOGRAM N/A 03/08/2014   Procedure: LEFT HEART CATHETERIZATION WITH CORONARY ANGIOGRAM;  Surgeon: Peter M Martinique, MD;  Location: [facility] CATH LAB;  Service: Cardiovascular;  Laterality: N/A;  . 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 PERCUTANEOUS CORONARY STENT INTERVENTION (PCI-S)  03/08/2014   Procedure: PERCUTANEOUS CORONARY STENT INTERVENTION (PCI-S);  Surgeon: Collier Salina  M Martinique, MD;  Location: [facility] CATH LAB;  Service: Cardiovascular;;  prox lad  bms    Social History   Socioeconomic History  . Marital status: Married    Spouse name: Not on file  . Number of children: Not on file  . Years of education: Not on file  . Highest education level: Not on file  Occupational History  . Not on file  Social Needs  . Financial resource strain: Not on file  . Food insecurity:    Worry: Not on file    Inability: Not on file  . Transportation needs:    Medical: Not on file    Non-medical: Not on file  Tobacco Use  . Smoking status: Never Smoker  . Smokeless tobacco: Never Used  Substance and Sexual Activity  . Alcohol use: No    Comment: rare glass of wine  . Drug use: No  . Sexual activity: Not Currently    Birth control/protection: Post-menopausal  Lifestyle  . Physical activity:    Days per week: Not on file    Minutes per session: Not on file  . Stress: Not on file  Relationships  . Social connections:    Talks on phone: Not on file    Gets together: Not on file    Attends religious service: Not on file    Active member of club or organization: Not on file    Attends meetings of clubs or organizations: Not on file    Relationship status: Not on file  Other Topics Concern  . Not on file  Social History Narrative  . Not on file     Family History  Problem Relation Age of Onset  . Stroke Mother   . Heart attack Mother   . Heart disease Mother   . Peripheral vascular disease Father        had stent in leg  . Colon cancer Father   . Atrial fibrillation Father        with pacemaker  . Thyroid disease Father   . Cancer Father   . Hearing loss Father   . Hypertension Father   . Pancreatic cancer Brother   . Cancer Brother   . Hypertension Brother   . Coronary artery disease Brother        with Stent  . Cancer Brother   . Hypertension Brother   . Coronary artery disease Brother        with CABG in his 63's  . Hypertension Brother   . Thyroid disease Brother   . Hypertension Brother   . Thyroid disease Sister     Review of Systems: As noted in HPI.  All other systems were reviewed and are negative.  Physical Exam: There were no vitals taken for this visit. There were no vitals filed for this visit.GENERAL:  Well appearing EF in NAD.  HEENT:  PERRL, EOMI, sclera are clear. Oropharynx is clear. NECK:  No jugular venous distention, carotid upstroke brisk and symmetric, no bruits, no thyromegaly or adenopathy LUNGS:  Clear to auscultation bilaterally CHEST:  Unremarkable HEART:  RRR,  PMI not displaced or sustained,S1 and S2 within normal limits, no S3, no S4: no clicks, no rubs, no murmurs ABD:  Soft, nontender. BS +, no masses or bruits. No hepatomegaly, no splenomegaly EXT:  2 + pulses throughout, no edema, no cyanosis no clubbing SKIN:  Warm and dry.  No rashes NEURO:  Alert and oriented x 3. Cranial nerves II through XII intact.  PSYCH:  Cognitively intact      LABORATORY DATA: Lab Results  Component Value Date   WBC 4.8 03/24/2017   HGB 14.0 03/24/2017   HCT 41.5 03/24/2017   PLT 185 03/24/2017   GLUCOSE 98 03/24/2017   CHOL 154 03/24/2017   TRIG 83 03/24/2017   HDL 64 03/24/2017   LDLCALC 73 03/24/2017   ALT 10 03/24/2017   AST 15 03/24/2017   NA 141 03/24/2017   K 4.5 03/24/2017    CL 108 03/24/2017   CREATININE 0.74 03/24/2017   BUN 17 03/24/2017   CO2 24 03/24/2017   TSH 2.50 01/03/2016   INR 1.46 05/21/2014   HGBA1C 6.1 (H) 05/17/2014   Myoview 07/10/15: Study Highlights    Nuclear stress EF: 49%.  The left ventricular ejection fraction is mildly decreased (45-54%).  There was no ST segment deviation noted during stress.  Defect 1: There is a defect present in the mid anteroseptal, mid inferolateral, apical septal, apical lateral and apex location.  Findings consistent with prior myocardial infarction.  This is a low risk study.   1. Low risk study 2. Scar antero septal, apical and inferolateral. 3. Low nl EF    Ecg today shows sinus brady with rate 48. Old septal infarct.  I have personally reviewed and interpreted this study.   Assessment / Plan: 1. CAD s/p anterior STEMI in Sept. 2015 treated with BMS of proximal LAD. S/p CABG November 2015.   Continue ASA.  Stress Myoview in January 2017 showed no ischemia and improved EF 49%. She is asymptomatic.  2. Ischemic cardiomyopathy with chronic systolic CHF. EF 35-40% by last Echo but improved to 49% by Myoview. Well compensated. On lasix and losartan. Coreg and aldactone held in the past due to hypotension. Also not a candidate for beta blocker due to bradycardia.   Will continue current therapy  3. Hyperlipidemia. Intolerance to high dose lipitor due to transaminitis. Tolerating Crestor well with good response. LFTs normal. LDL 73.   4. Elevated BP - this is unusual for her. No changes made today and we will monitor at Rehab.   Follow up in 6 months.

## 2017-10-22 ENCOUNTER — Ambulatory Visit: Payer: PPO | Admitting: Cardiology

## 2017-10-31 NOTE — Progress Notes (Signed)
Kelli Roy Date of Birth: 05-Jun-1946 Medical Record #[id number]  History of Present Illness: Kelli Roy is seen today for follow up of CAD and CHF. She presented in September 2015 with an Anterior STEMI. She underwent emergent stenting of the proximal LAD occlusion with a BMS. She also had 50-70% left main disease, 70-80% ostial LAD disease and 50-70% ostial LCx disease. EF was 25-30%. She was placed on high dose lipitor therapy but  was noted to have marked elevation of her transaminases and her statin was discontinued with improvement. She has been able to tolerate Crestor. In November 2015 she returned for CABG by Dr. Cyndia Bent with LIMA to the LAD, SVG to diag, SVG to OM, and SVG to RCA. She also developed a rash to both Brilinta and Plavix.   She had repeat Echo in February 2016 showed an EF of 35-40% with akinesis of the antero-septum and apex. In March 2016 she was admitted with acute CHF. She was diuresed with IV lasix and was DC home with addition of lasix and aldactone. She later developed hypotension and Coreg and aldactone were held. In January 2017 she had a stress myoview. This showed scar in the anteroseptal, apical, and inferolateral segments without ischemia. EF improved to 49%.   On follow up today she is doing well. She is participating in maintenance cardiac Rehab.   She denies any chest pain or SOB except some days when she is on her feet all day she notes a tired ache in her chest. No palpitations.   Allergies as of 11/01/2017      Reactions   Carvedilol Shortness Of Breath   Spironolactone Shortness Of Breath   Demerol [meperidine] Nausea Only   Lipitor [atorvastatin] Itching   Lisinopril Cough   Meperidine Hcl Nausea Only   Plavix [clopidogrel Bisulfate]    Rash   Dexilant [dexlansoprazole] Rash   Latex Rash   Neomycin Rash   Sulfonamide Derivatives Rash   Ticagrelor Itching, Rash   Brilinta      Medication List        Accurate as of 11/01/17 10:11 AM. Always use  your most recent med list.          acetaminophen 500 MG tablet Commonly known as:  TYLENOL Take 500 mg by mouth every 6 (six) hours as needed for moderate pain or headache.   aspirin EC 81 MG tablet Take 81 mg by mouth at bedtime.   docusate sodium 100 MG capsule Commonly known as:  COLACE Take 100-200 mg by mouth 2 (two) times daily. Take 100mg s in the morning and 200mg s at night   eszopiclone 1 MG Tabs tablet Commonly known as:  LUNESTA TAKE A HALF TABLET AT BEDTIME AS NEEDED   furosemide 20 MG tablet Commonly known as:  LASIX Take 1 to 2 tablets daily   losartan 50 MG tablet Commonly known as:  COZAAR TAKE 1 TABLET BY MOUTH EVERY DAY   nitroGLYCERIN 0.4 MG SL tablet Commonly known as:  NITROSTAT PLACE 1 TABLET (0.4 MG TOTAL) UNDER THE TONGUE EVERY 5 (FIVE) MINUTES AS NEEDED FOR CHEST PAIN.   QUNOL ULTRA COQ10 PO Take by mouth daily. 2 teaspoons   rosuvastatin 10 MG tablet Commonly known as:  CRESTOR TAKE 1 TABLET (10 MG TOTAL) BY MOUTH DAILY.   tetrahydrozoline-zinc 0.05-0.25 % ophthalmic solution Commonly known as:  VISINE-AC Place 2 drops into both eyes daily as needed (Dry eyes).   VITAMIN B 12 PO Take 1,000 mcg by mouth.  VITAMIN D3 PO Take 50 mcg by mouth.        Allergies  Allergen Reactions  . Carvedilol Shortness Of Breath  . Spironolactone Shortness Of Breath  . Demerol [Meperidine] Nausea Only  . Lipitor [Atorvastatin] Itching  . Lisinopril Cough  . Meperidine Hcl Nausea Only  . Plavix [Clopidogrel Bisulfate]     Rash   . Dexilant [Dexlansoprazole] Rash  . Latex Rash  . Neomycin Rash  . Sulfonamide Derivatives Rash  . Ticagrelor Itching and Rash    Brilinta     Past Medical History:  Diagnosis Date  . Allergy   . Alopecia   . Arthritis   . CAD (coronary artery disease)    a. 03/2014 Ant STEMI/PCI: LM 50-70d, LAD 70-80ost, 100p (2.75x20 Veriflex BMS), LCX 50-70ost, 40-50p, RCA dom - 50p/m, EF 25-30%, mid ant/dist inf AK, dist  ant/apical DK, mod MR.; s/p CABG x 4 05/2015 with LIMA to LAD, SVG to DX, SVG to OM and SVG to RCA per Dr. Cyndia Bent   . Cataract   . Chronic systolic CHF (congestive heart failure) ([place])    a. 03/2014 Echo: EF 25-30%; Pre op TEE 05/2014 with EF of 50%  . Family history of cardiovascular disease   . Family history of colon cancer   . History of long-term treatment with high-risk medication   . Hyperlipidemia   . Hypertension   . Insomnia   . Ischemic cardiomyopathy    a. 03/2014 Echo: EF 25-30%, mid-apical/anteroseptal and inf AK, Gr 2 DD, ? apical thrombus, Mild MR.-->Discharged with LifeVest.  . Malaise and fatigue   . Myocardial infarction ([place]) 03/2014  . Shortness of breath dyspnea   . Sinus headache     Past Surgical History:  Procedure Laterality Date  . CARDIAC CATHETERIZATION  2001   showed no blockages  . CATARACT EXTRACTION W/PHACO Left 01/04/2014   Procedure: CATARACT EXTRACTION PHACO AND INTRAOCULAR LENS PLACEMENT (IOC);  Surgeon: Tonny Branch, MD;  Location: [facility];  Service: Ophthalmology;  Laterality: Left;  CDE:5.65  . CATARACT EXTRACTION W/PHACO Right 01/29/2014   Procedure: CATARACT EXTRACTION PHACO AND INTRAOCULAR LENS PLACEMENT RIGHT EYE CDE=4.49;  Surgeon: Tonny Branch, MD;  Location: [facility];  Service: Ophthalmology;  Laterality: Right;  . CERVICAL SPINE SURGERY  09/27/2003   C6-67 servical fusion  . COLONOSCOPY WITH PROPOFOL N/A 05/18/2016   Procedure: COLONOSCOPY WITH PROPOFOL;  Surgeon: Garlan Fair, MD;  Location: [facility];  Service: Endoscopy;  Laterality: N/A;  . CORONARY ARTERY BYPASS GRAFT N/A 05/21/2014   Procedure: CORONARY ARTERY BYPASS GRAFTING (CABG);  Surgeon: Gaye Pollack, MD;  Location: [facility];  Service: Open Heart Surgery;  Laterality: N/A;  . CYSTECTOMY  1998   fibroid cysts  . DILATION AND CURETTAGE OF UTERUS  1976   2nd to miscarriage  . EYE SURGERY    . INTRAOPERATIVE TRANSESOPHAGEAL ECHOCARDIOGRAM N/A 05/21/2014   Procedure: INTRAOPERATIVE  TRANSESOPHAGEAL ECHOCARDIOGRAM;  Surgeon: Gaye Pollack, MD;  [facility];  Service: Open Heart Surgery;  Laterality: N/A;  . LEFT HEART CATHETERIZATION WITH CORONARY ANGIOGRAM N/A 03/08/2014   Procedure: LEFT HEART CATHETERIZATION WITH CORONARY ANGIOGRAM;  Surgeon: Karelly Dewalt M Martinique, MD;  Location: [facility] CATH LAB;  Service: Cardiovascular;  Laterality: N/A;  . 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 PERCUTANEOUS CORONARY STENT INTERVENTION (PCI-S)  03/08/2014   Procedure: PERCUTANEOUS CORONARY STENT INTERVENTION (PCI-S);  Surgeon: Tihanna Goodson M Martinique, MD;  Location: [facility] CATH LAB;  Service: Cardiovascular;;  prox lad  bms    Social History   Socioeconomic History  . Marital status: Married    Spouse name: Not on file  . Number of children: Not on file  . Years of education: Not on file  . Highest education level: Not on file  Occupational History  . Not on file  Social Needs  . Financial resource strain: Not on file  . Food insecurity:    Worry: Not on file    Inability: Not on file  . Transportation needs:    Medical: Not on file    Non-medical: Not on file  Tobacco Use  . Smoking status: Never Smoker  . Smokeless tobacco: Never Used  Substance and Sexual Activity  . Alcohol use: No    Comment: rare glass of wine  . Drug use: No  . Sexual activity: Not Currently    Birth control/protection: Post-menopausal  Lifestyle  . Physical activity:    Days per week: Not on file    Minutes per session: Not on file  . Stress: Not on file  Relationships  . Social connections:    Talks on phone: Not on file    Gets together: Not on file    Attends religious service: Not on file    Active member of club or organization: Not on file    Attends meetings of clubs or organizations: Not on file    Relationship status: Not on file  Other Topics Concern  . Not on file  Social History Narrative  . Not on file    Family History  Problem Relation Age of  Onset  . Stroke Mother   . Heart attack Mother   . Heart disease Mother   . Peripheral vascular disease Father        had stent in leg  . Colon cancer Father   . Atrial fibrillation Father        with pacemaker  . Thyroid disease Father   . Cancer Father   . Hearing loss Father   . Hypertension Father   . Pancreatic cancer Brother   . Cancer Brother   . Hypertension Brother   . Coronary artery disease Brother        with Stent  . Cancer Brother   . Hypertension Brother   . Coronary artery disease Brother        with CABG in his 23's  . Hypertension Brother   . Thyroid disease Brother   . Hypertension Brother   . Thyroid disease Sister     Review of Systems: As noted in HPI.  All other systems were reviewed and are negative.  Physical Exam: BP 138/75   Pulse (!) 52   Ht 5\' 5"  (1.651 m)   Wt 162 lb 6.4 oz (73.7 kg)   BMI 27.02 kg/m  Filed Weights   11/01/17 0957  Weight: 162 lb 6.4 oz (73.7 kg)    GENERAL:  Well appearing WF in NAD HEENT:  PERRL, EOMI, sclera are clear. Oropharynx is clear. NECK:  No jugular venous distention, carotid upstroke brisk and symmetric, no bruits, no thyromegaly or adenopathy LUNGS:  Clear to auscultation bilaterally CHEST:  Unremarkable HEART:  RRR,  PMI not displaced or sustained,S1 and S2 within normal limits, no S3, no S4: no clicks, no rubs, no murmurs ABD:  Soft, nontender. BS +, no masses or bruits. No hepatomegaly, no splenomegaly EXT:  2 + pulses throughout, no edema, no cyanosis no clubbing  SKIN:  Warm and dry.  No rashes NEURO:  Alert and oriented x 3. Cranial nerves II through XII intact. PSYCH:  Cognitively intact       LABORATORY DATA: Lab Results  Component Value Date   WBC 4.8 03/24/2017   HGB 14.0 03/24/2017   HCT 41.5 03/24/2017   PLT 185 03/24/2017   GLUCOSE 98 03/24/2017   CHOL 154 03/24/2017   TRIG 83 03/24/2017   HDL 64 03/24/2017   LDLCALC 73 03/24/2017   ALT 10 03/24/2017   AST 15 03/24/2017    NA 141 03/24/2017   K 4.5 03/24/2017   CL 108 03/24/2017   CREATININE 0.74 03/24/2017   BUN 17 03/24/2017   CO2 24 03/24/2017   TSH 2.50 01/03/2016   INR 1.46 05/21/2014   HGBA1C 6.1 (H) 05/17/2014   Myoview 07/10/15: Study Highlights    Nuclear stress EF: 49%.  The left ventricular ejection fraction is mildly decreased (45-54%).  There was no ST segment deviation noted during stress.  Defect 1: There is a defect present in the mid anteroseptal, mid inferolateral, apical septal, apical lateral and apex location.  Findings consistent with prior myocardial infarction.  This is a low risk study.   1. Low risk study 2. Scar antero septal, apical and inferolateral. 3. Low nl EF    Assessment / Plan: 1. CAD s/p anterior STEMI in Sept. 2015 treated with BMS of proximal LAD. S/p CABG November 2015.  Stress Myoview in January 2017 showed no ischemia and improved EF 49%. She is minimally symptomatic. Will continue current medical therapy.  2. Ischemic cardiomyopathy with chronic systolic CHF. EF 35-40% by last Echo but improved to 49% by Myoview. Well compensated. On lasix and losartan. Coreg and aldactone held in the past due to hypotension. Also not a candidate for beta blocker due to bradycardia.   Will continue current therapy  3. Hyperlipidemia. Intolerance to high dose lipitor due to transaminitis. Tolerating Crestor well with good response. Will check fasting labs on next visit in 6 months.  Follow up in 6 months.

## 2017-11-01 ENCOUNTER — Ambulatory Visit: Payer: PPO | Admitting: Cardiology

## 2017-11-01 ENCOUNTER — Encounter: Payer: Self-pay | Admitting: Cardiology

## 2017-11-01 VITALS — BP 138/75 | HR 52 | Ht 65.0 in | Wt 162.4 lb

## 2017-11-01 DIAGNOSIS — E7800 Pure hypercholesterolemia, unspecified: Secondary | ICD-10-CM | POA: Diagnosis not present

## 2017-11-01 DIAGNOSIS — Z951 Presence of aortocoronary bypass graft: Secondary | ICD-10-CM | POA: Diagnosis not present

## 2017-11-01 DIAGNOSIS — I5022 Chronic systolic (congestive) heart failure: Secondary | ICD-10-CM | POA: Diagnosis not present

## 2017-11-01 DIAGNOSIS — I2510 Atherosclerotic heart disease of native coronary artery without angina pectoris: Secondary | ICD-10-CM

## 2017-11-01 DIAGNOSIS — I10 Essential (primary) hypertension: Secondary | ICD-10-CM

## 2017-11-01 NOTE — Patient Instructions (Signed)
Continue your current therapy  We will see you in 6 months with lab work

## 2017-11-11 ENCOUNTER — Other Ambulatory Visit: Payer: Self-pay | Admitting: Cardiology

## 2017-11-29 DIAGNOSIS — Z95818 Presence of other cardiac implants and grafts: Secondary | ICD-10-CM | POA: Diagnosis not present

## 2017-11-29 DIAGNOSIS — Z9861 Coronary angioplasty status: Secondary | ICD-10-CM | POA: Diagnosis not present

## 2017-11-29 DIAGNOSIS — Z91040 Latex allergy status: Secondary | ICD-10-CM | POA: Diagnosis not present

## 2017-11-29 DIAGNOSIS — R079 Chest pain, unspecified: Secondary | ICD-10-CM | POA: Diagnosis not present

## 2017-11-29 DIAGNOSIS — R0789 Other chest pain: Secondary | ICD-10-CM | POA: Diagnosis not present

## 2017-11-29 DIAGNOSIS — M13112 Monoarthritis, not elsewhere classified, left shoulder: Secondary | ICD-10-CM | POA: Diagnosis not present

## 2017-11-29 DIAGNOSIS — I509 Heart failure, unspecified: Secondary | ICD-10-CM | POA: Diagnosis not present

## 2017-11-29 DIAGNOSIS — Z7982 Long term (current) use of aspirin: Secondary | ICD-10-CM | POA: Diagnosis not present

## 2017-11-29 DIAGNOSIS — Z951 Presence of aortocoronary bypass graft: Secondary | ICD-10-CM | POA: Diagnosis not present

## 2017-12-01 ENCOUNTER — Encounter: Payer: Self-pay | Admitting: Family Medicine

## 2017-12-01 ENCOUNTER — Ambulatory Visit (INDEPENDENT_AMBULATORY_CARE_PROVIDER_SITE_OTHER): Payer: PPO | Admitting: Family Medicine

## 2017-12-01 ENCOUNTER — Other Ambulatory Visit: Payer: Self-pay

## 2017-12-01 VITALS — BP 124/62 | HR 62 | Temp 97.9°F | Resp 14 | Ht 65.0 in | Wt 162.0 lb

## 2017-12-01 DIAGNOSIS — I10 Essential (primary) hypertension: Secondary | ICD-10-CM

## 2017-12-01 DIAGNOSIS — I5023 Acute on chronic systolic (congestive) heart failure: Secondary | ICD-10-CM

## 2017-12-01 DIAGNOSIS — M25512 Pain in left shoulder: Secondary | ICD-10-CM | POA: Diagnosis not present

## 2017-12-01 DIAGNOSIS — G8929 Other chronic pain: Secondary | ICD-10-CM

## 2017-12-01 NOTE — Patient Instructions (Addendum)
Release of records- [facility] [place]  [phone] Take lasix 40mg  once a day for 7 days  F/U as previous

## 2017-12-01 NOTE — Progress Notes (Signed)
   Subjective:    Patient ID: Kelli Roy, female    DOB: [birth year], 72 y.o.   MRN: [id number]  Patient presents for ER F/U (was seen in [facility] for atypical chest pain- CXR/ EKG/ Troponin WNL- reports that she has muscle pain across upper chest when she tried to take a deep breath- states that it felt as if she had pulled muscle)   Was on vacation in [place], was there last Friday/sat felt fine. Sunday begin having some SOB and soreness across her chest. Thougght is was muscular since she had been having some left shoulder pain. Used topical aspercreme but this did not help. Later has some GI upset/nausea, but had normal BM. Still felt bad, so she went to ER at [facility] no acute ST, old septal infarct Labs reviewd- BNP elevated 362  Review Of Systems:  GEN- denies fatigue, fever, weight loss,weakness, recent illness HEENT- denies eye drainage, change in vision, nasal discharge, CVS- + chest pain, palpitations RESP- + SOB, cough, wheeze ABD- denies N/V, change in stools, abd pain GU- denies dysuria, hematuria, dribbling, incontinence MSK- denies joint pain, muscle aches, injury Neuro- denies headache, dizziness, syncope, seizure activity       Objective:    BP 124/62   Pulse 62   Temp 97.9 F (36.6 C) (Oral)   Resp 14   Ht 5\' 5"  (1.651 m)   Wt 162 lb (73.5 kg)   SpO2 96%   BMI 26.96 kg/m  GEN- NAD, alert and oriented x3 HEENT- PERRL, EOMI, non injected sclera, pink conjunctiva, MMM, oropharynx clear Neck- Supple, no thyromegaly CVS- RRR, no murmur RESP-CTAB, no crackles, no rales ABD-NABS,soft,NT,ND MSK- fair ROM LUE, TTP near [facility], rotator cuff grossly in tact, biceps in tact  EXT- No edema Pulses- Radial, DP- 2+        Assessment & Plan:      Problem List Items Addressed This Visit      Unprioritized   Essential hypertension - Primary    Other Visit Diagnoses    Acute on chronic systolic heart failure (HCC)       concern  this was mild CHF exacerbation elevated BNP and symptoms, reviewed some of the hospital records, obtain complete notes/images. agree to lasix 40mg  daily for fluid appt scheduled with her cardiologist for tomorrow   Chronic left shoulder pain       most likley OA, await cardiac work up, then proceed with ortho, use tylenol or topical rub      Note: This dictation was prepared with Dragon dictation along with smaller phrase technology. Any transcriptional errors that result from this process are unintentional.

## 2017-12-02 ENCOUNTER — Encounter: Payer: Self-pay | Admitting: Family Medicine

## 2017-12-02 ENCOUNTER — Encounter: Payer: Self-pay | Admitting: Physician Assistant

## 2017-12-02 ENCOUNTER — Ambulatory Visit (INDEPENDENT_AMBULATORY_CARE_PROVIDER_SITE_OTHER): Payer: PPO | Admitting: Physician Assistant

## 2017-12-02 VITALS — BP 110/60 | HR 53 | Ht 65.0 in | Wt 160.0 lb

## 2017-12-02 DIAGNOSIS — I5022 Chronic systolic (congestive) heart failure: Secondary | ICD-10-CM | POA: Diagnosis not present

## 2017-12-02 DIAGNOSIS — I25709 Atherosclerosis of coronary artery bypass graft(s), unspecified, with unspecified angina pectoris: Secondary | ICD-10-CM

## 2017-12-02 DIAGNOSIS — E785 Hyperlipidemia, unspecified: Secondary | ICD-10-CM

## 2017-12-02 DIAGNOSIS — R0789 Other chest pain: Secondary | ICD-10-CM

## 2017-12-02 DIAGNOSIS — I10 Essential (primary) hypertension: Secondary | ICD-10-CM | POA: Diagnosis not present

## 2017-12-02 DIAGNOSIS — I255 Ischemic cardiomyopathy: Secondary | ICD-10-CM

## 2017-12-02 NOTE — Progress Notes (Signed)
Cardiology Office Note    Date:  12/03/2017   ID:  Kelli Roy, DOB [birth year], MRN [id number]  PCP:  Alycia Rossetti, MD  Cardiologist:  Dr. Ellyn Hack  Chief Complaint  Patient presents with  . Follow-up    seen for Dr. Ellyn Hack.     History of Present Illness:  Kelli Roy is a 72 y.o. female with PMH of CAD, chronic systolic heart failure, hypertension, hyperlipidemia, and history of ischemic cardiomyopathy.  Patient had anterior STEMI in September 2015 and underwent emergent stenting of the proximal LAD with a BMS.  She also had 50 to 70% left main disease, 70 to 80% ostial LAD disease, 50 to 70% ostial left circumflex disease, EF was 25 to 30%.  She was placed on high-dose Lipitor therapy but noted to have marked elevation of her transaminases and her statin was discontinued.  Since then, she has been tolerating Crestor.  She eventually returned in November 2015 for CABG by Dr. Cyndia Bent was LIMA to LAD, SVG to diagonal, SVG to OM, SVG to RCA.  She has a history of a rash reaction to both Brilinta and Plavix.  Echocardiogram in February 2016 showed EF 35 to 40% with akinesis to the anteroseptum and apex.  She developed acute heart failure in March 2016.  She was diuresed with IV Lasix and it was discharged home with addition of Lasix and Aldactone.  She later developed hypotension requiring the discontinuation of Coreg and Aldactone.  She had a Myoview in January 2017 that showed scar in the anteroseptal, apical and inferolateral segment without ischemia, EF 49%.  Patient was last seen by Dr. Martinique in April 2019, patient was doing well at the time.  She presents today for evaluation of chest discomfort.  She was most recently seen in the emergency room at [facility] this past Monday.  She says she had a diffuse chest soreness that is worse with deep inspiration before she left [place].  She says that she was having persistent chest soreness for several hours before she  sought medical attention.  Troponin was negative.  Chest soreness was found to be atypical.  Laboratory workup was normal.  Chest x-ray was normal as well.  She was discharged to follow-up with cardiology closely.  She did have some residual chest soreness on Tuesday as well.  She has not had any more chest soreness since.  She says that this chest soreness is different from the previous angina.  Given the resolution of symptoms and a negative EKG, I wish to observe the symptom for now.  If she does have recurrent chest discomfort, we can consider stress testing, otherwise I will see her back in 1 month for reassessment.    Past Medical History:  Diagnosis Date  . Allergy   . Alopecia   . Arthritis   . CAD (coronary artery disease)    a. 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 Cyndia Bent   . Cataract   . Chronic systolic CHF (congestive heart failure) ([place])    a. 03/2014 Echo: EF 25-30%; Pre op TEE 05/2014 with EF of 50%  . Family history of cardiovascular disease   . Family history of colon cancer   . History of  long-term treatment with high-risk medication   . Hyperlipidemia   . Hypertension   . Insomnia   . Ischemic cardiomyopathy    a. 03/2014 Echo: EF 25-30%, mid-apical/anteroseptal and inf AK, Gr 2 DD, ? apical thrombus, Mild MR.-->Discharged with LifeVest.  . Malaise and fatigue   . Myocardial infarction ([place]) 03/2014  . Shortness of breath dyspnea   . Sinus headache     Past Surgical History:  Procedure Laterality Date  . CARDIAC CATHETERIZATION  2001   showed no blockages  . CATARACT EXTRACTION W/PHACO Left 01/04/2014   Procedure: CATARACT EXTRACTION PHACO AND INTRAOCULAR LENS PLACEMENT (IOC);  Surgeon: Tonny Branch, MD;  Location: [facility];  Service: Ophthalmology;   Laterality: Left;  CDE:5.65  . CATARACT EXTRACTION W/PHACO Right 01/29/2014   Procedure: CATARACT EXTRACTION PHACO AND INTRAOCULAR LENS PLACEMENT RIGHT EYE CDE=4.49;  Surgeon: Tonny Branch, MD;  Location: [facility];  Service: Ophthalmology;  Laterality: Right;  . CERVICAL SPINE SURGERY  09/27/2003   C6-67 servical fusion  . COLONOSCOPY WITH PROPOFOL N/A 05/18/2016   Procedure: COLONOSCOPY WITH PROPOFOL;  Surgeon: Garlan Fair, MD;  Location: [facility];  Service: Endoscopy;  Laterality: N/A;  . CORONARY ARTERY BYPASS GRAFT N/A 05/21/2014   Procedure: CORONARY ARTERY BYPASS GRAFTING (CABG);  Surgeon: Gaye Pollack, MD;  Location: [facility];  Service: Open Heart Surgery;  Laterality: N/A;  . CYSTECTOMY  1998   fibroid cysts  . DILATION AND CURETTAGE OF UTERUS  1976   2nd to miscarriage  . EYE SURGERY    . INTRAOPERATIVE TRANSESOPHAGEAL ECHOCARDIOGRAM N/A 05/21/2014   Procedure: INTRAOPERATIVE TRANSESOPHAGEAL ECHOCARDIOGRAM;  Surgeon: Gaye Pollack, MD;  Location: [facility] OR;  Service: Open Heart Surgery;  Laterality: N/A;  . LEFT HEART CATHETERIZATION WITH CORONARY ANGIOGRAM N/A 03/08/2014   Procedure: LEFT HEART CATHETERIZATION WITH CORONARY ANGIOGRAM;  Surgeon: Peter M Martinique, MD;  Location: [facility] CATH LAB;  Service: Cardiovascular;  Laterality: N/A;  . 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 PERCUTANEOUS CORONARY STENT INTERVENTION (PCI-S)  03/08/2014   Procedure: PERCUTANEOUS CORONARY STENT INTERVENTION (PCI-S);  Surgeon: Peter M Martinique, MD;  Location: [facility] CATH LAB;  Service: Cardiovascular;;  prox lad  bms    Current Medications: Outpatient Medications Prior to Visit  Medication Sig Dispense Refill  . acetaminophen (TYLENOL) 500 MG tablet Take 500 mg by mouth every 6 (six) hours as needed for moderate pain or headache.     Marland Kitchen aspirin EC 81 MG tablet Take 81 mg by mouth at bedtime.     . Cholecalciferol (VITAMIN D3 PO) Take 50 mcg by mouth.     . Coenzyme  Q10-Vitamin E (QUNOL ULTRA COQ10 PO) Take by mouth daily. 2 teaspoons    . Cyanocobalamin (VITAMIN B 12 PO) Take 1,000 mcg by mouth.     . docusate sodium (COLACE) 100 MG capsule Take 100-200 mg by mouth 2 (two) times daily. Take 100mg s in the morning and 200mg s at night    . eszopiclone (LUNESTA) 1 MG TABS tablet TAKE A HALF TABLET AT BEDTIME AS NEEDED 30 tablet 2  . furosemide (LASIX) 20 MG tablet TAKE 1 TO 2 TABLETS DAILY 180 tablet 1  . losartan (COZAAR) 50 MG tablet TAKE 1 TABLET BY MOUTH EVERY DAY 90 tablet 2  . nitroGLYCERIN (NITROSTAT) 0.4 MG SL tablet PLACE 1 TABLET (0.4 MG TOTAL) UNDER THE TONGUE EVERY 5 (FIVE) MINUTES AS NEEDED FOR CHEST PAIN. 25 tablet 3  . rosuvastatin (  CRESTOR) 10 MG tablet TAKE 1 TABLET (10 MG TOTAL) BY MOUTH DAILY. (Patient taking differently: Take 10mg s daily at night) 90 tablet 1  . tetrahydrozoline-zinc (VISINE-AC) 0.05-0.25 % ophthalmic solution Place 2 drops into both eyes daily as needed (Dry eyes).     No facility-administered medications prior to visit.      Allergies:   Carvedilol; Spironolactone; Demerol [meperidine]; Lipitor [atorvastatin]; Lisinopril; Meperidine hcl; Plavix [clopidogrel bisulfate]; Dexilant [dexlansoprazole]; Latex; Neomycin; Sulfonamide derivatives; and Ticagrelor   Social History   Socioeconomic History  . Marital status: Married    Spouse name: Not on file  . Number of children: Not on file  . Years of education: Not on file  . Highest education level: Not on file  Occupational History  . Not on file  Social Needs  . Financial resource strain: Not on file  . Food insecurity:    Worry: Not on file    Inability: Not on file  . Transportation needs:    Medical: Not on file    Non-medical: Not on file  Tobacco Use  . Smoking status: Never Smoker  . Smokeless tobacco: Never Used  Substance and Sexual Activity  . Alcohol use: No    Comment: rare glass of wine  . Drug use: No  . Sexual activity: Not Currently    Birth  control/protection: Post-menopausal  Lifestyle  . Physical activity:    Days per week: Not on file    Minutes per session: Not on file  . Stress: Not on file  Relationships  . Social connections:    Talks on phone: Not on file    Gets together: Not on file    Attends religious service: Not on file    Active member of club or organization: Not on file    Attends meetings of clubs or organizations: Not on file    Relationship status: Not on file  Other Topics Concern  . Not on file  Social History Narrative  . Not on file     Family History:  The patient's family history includes Atrial fibrillation in her father; Cancer in her brother, brother, and father; Colon cancer in her father; Coronary artery disease in her brother and brother; Hearing loss in her father; Heart attack in her mother; Heart disease in her mother; Hypertension in her brother, brother, brother, brother, and father; Pancreatic cancer in her brother; Peripheral vascular disease in her father; Stroke in her mother; Thyroid disease in her brother, father, and sister.   ROS:   Please see the history of present illness.    ROS All other systems reviewed and are negative.   PHYSICAL EXAM:   VS:  Ht 5\' 5"  (1.651 m)   BMI 26.96 kg/m    GEN: Well nourished, well developed, in no acute distress  HEENT: normal  Neck: no JVD, carotid bruits, or masses Cardiac: RRR; no murmurs, rubs, or gallops,no edema  Respiratory:  clear to auscultation bilaterally, normal work of breathing GI: soft, nontender, nondistended, + BS MS: no deformity or atrophy  Skin: warm and dry, no rash Neuro:  Alert and Oriented x 3, Strength and sensation are intact Psych: euthymic mood, full affect  Wt Readings from Last 3 Encounters:  12/01/17 162 lb (73.5 kg)  11/01/17 162 lb 6.4 oz (73.7 kg)  09/21/17 161 lb (73 kg)      Studies/Labs Reviewed:   EKG:  EKG is not ordered today.   Recent Labs: 03/24/2017: ALT 10; BUN 17; Creat 0.74;  Hemoglobin 14.0; Platelets 185; Potassium 4.5; Sodium 141   Lipid Panel    Component Value Date/Time   CHOL 154 03/24/2017 1117   TRIG 83 03/24/2017 1117   HDL 64 03/24/2017 1117   CHOLHDL 2.4 03/24/2017 1117   VLDL 25 07/14/2016 1211   LDLCALC 73 03/24/2017 1117    Additional studies/ records that were reviewed today include:   Echo 08/30/2014 LV EF: 35% -  40% Study Conclusions  - Left ventricle: Poor acoustical windows with multiple wall motion abnormalities. Recommend limited study with definity contrast agent for more accurate assessment. There is distal septal akinesis. The cavity size was normal. Systolic function was moderately reduced. The estimated ejection fraction was in the range of 35% to 40%. There is akinesis of the apical myocardium. There is akinesis of the apicalanterior myocardium. There is akinesis of the midanteroseptal myocardium. There is akinesis of the apicallateral myocardium. There was a reduced contribution of atrial contraction to ventricular filling, due to increased ventricular diastolic pressure or atrial contractile dysfunction. Doppler parameters are consistent with a reversible restrictive pattern, indicative of decreased left ventricular diastolic compliance and/or increased left atrial pressure (grade 3 diastolic dysfunction). - Aortic valve: Trileaflet; normal thickness, mildly calcified leaflets. - Mitral valve: There was mild regurgitation. - Left atrium: The atrium was mildly dilated. - Pulmonic valve: There was mild regurgitation.   Myoview 07/10/2015 Study Highlights    Nuclear stress EF: 49%.  The left ventricular ejection fraction is mildly decreased (45-54%).  There was no ST segment deviation noted during stress.  Defect 1: There is a defect present in the mid anteroseptal, mid inferolateral, apical septal, apical lateral and apex location.  Findings consistent with prior myocardial  infarction.  This is a low risk study.   1. Low risk study 2. Scar antero septal, apical and inferolateral. 3. Low nl EF     ASSESSMENT:    1. Atypical chest pain   2. Coronary artery disease involving coronary bypass graft of native heart with angina pectoris ([place])   3. Chronic systolic heart failure ([place])   4. Essential hypertension   5. Hyperlipidemia, unspecified hyperlipidemia type   6. Ischemic cardiomyopathy      PLAN:  In order of problems listed above:  1. Atypical chest pain: Recently evaluated at [facility] in [facility] for persistent chest soreness lasted several hours.  EKG was negative, troponin was negative despite prolonged chest pain.  Chest pain was different from the previous angina.  We discussed several options including stress testing versus observation, we will proceed with observation for now, she will return in 1 month for reassessment.  During the meantime if she does have recurrent chest discomfort, I will obtain a stress test.  2. CAD s/p CABG: Previous Myoview in 2017 was normal.  On aspirin and statin  3. Chronic systolic heart failure: Euvolemic on physical exam  4. Hypertension: blood pressure stable  5. Hyperlipidemia: On Crestor 10 mg daily continue on current medication    Medication Adjustments/Labs and Tests Ordered: Current medicines are reviewed at length with the patient today.  Concerns regarding medicines are outlined above.  Medication changes, Labs and Tests ordered today are listed in the Patient Instructions below. Patient Instructions  Medication Instructions: Your physician recommends that you continue on your current medications as directed. Please refer to the Current Medication list given to you today.  If you need a refill on your cardiac medications before your next appointment, please call your pharmacy.  Follow-Up: Your physician wants you to follow-up in one month with Dr. Martinique or  Almyra Deforest, PA.   Thank you for choosing Heartcare at [facility], Almyra Deforest, [place]  12/03/2017 11:40 AM    [address], [place], [place]  [address] Phone: [phone]; Fax: [phone]

## 2017-12-02 NOTE — Patient Instructions (Signed)
Medication Instructions: Your physician recommends that you continue on your current medications as directed. Please refer to the Current Medication list given to you today.  If you need a refill on your cardiac medications before your next appointment, please call your pharmacy.    Follow-Up: Your physician wants you to follow-up in one month with Dr. Martinique or Almyra Deforest, PA.   Thank you for choosing Heartcare at [facility]!!

## 2017-12-03 ENCOUNTER — Encounter: Payer: Self-pay | Admitting: Physician Assistant

## 2017-12-16 ENCOUNTER — Telehealth: Payer: Self-pay | Admitting: Physician Assistant

## 2017-12-16 NOTE — Telephone Encounter (Signed)
Received records from [facility] on 12/16/17, Appt 12/28/17 @ 10:00AM. [place]

## 2017-12-28 ENCOUNTER — Encounter: Payer: Self-pay | Admitting: Physician Assistant

## 2017-12-28 ENCOUNTER — Ambulatory Visit (INDEPENDENT_AMBULATORY_CARE_PROVIDER_SITE_OTHER): Payer: PPO | Admitting: Physician Assistant

## 2017-12-28 VITALS — BP 126/65 | HR 53 | Ht 65.0 in | Wt 159.0 lb

## 2017-12-28 DIAGNOSIS — I255 Ischemic cardiomyopathy: Secondary | ICD-10-CM

## 2017-12-28 DIAGNOSIS — I5022 Chronic systolic (congestive) heart failure: Secondary | ICD-10-CM

## 2017-12-28 DIAGNOSIS — I25810 Atherosclerosis of coronary artery bypass graft(s) without angina pectoris: Secondary | ICD-10-CM

## 2017-12-28 DIAGNOSIS — E785 Hyperlipidemia, unspecified: Secondary | ICD-10-CM | POA: Diagnosis not present

## 2017-12-28 DIAGNOSIS — I10 Essential (primary) hypertension: Secondary | ICD-10-CM

## 2017-12-28 NOTE — Progress Notes (Signed)
Cardiology Office Note    Date:  12/30/2017   ID:  Kelli Roy, DOB January 15, 1946, MRN [id number]  PCP:  Alycia Rossetti, MD  Cardiologist:  Dr. Martinique  Chief Complaint  Patient presents with  . Follow-up    1 month follow up    History of Present Illness:  Kelli Roy is a 72 y.o. female with PMH of CAD, chronic systolic heart failure, hypertension, hyperlipidemia, and history of ischemic cardiomyopathy.  Patient had anterior STEMI in September 2015 and underwent emergent stenting of the proximal LAD with a BMS.  She also had 50 to 70% left main disease, 70 to 80% ostial LAD disease, 50 to 70% ostial left circumflex disease, EF was 25 to 30%.  She was placed on high-dose Lipitor therapy but noted to have marked elevation of her transaminases and her statin was discontinued.  Since then, she has been tolerating Crestor.  She eventually returned in November 2015 for CABG by Dr. Cyndia Bent was LIMA to LAD, SVG to diagonal, SVG to OM, SVG to RCA.  She has a history of a rash reaction to both Brilinta and Plavix.  Echocardiogram in February 2016 showed EF 35 to 40% with akinesis to the anteroseptum and apex.  She developed acute heart failure in March 2016.  She was diuresed with IV Lasix and it was discharged home with addition of Lasix and Aldactone.  She later developed hypotension requiring the discontinuation of Coreg and Aldactone.  She had a Myoview in January 2017 that showed scar in the anteroseptal, apical and inferolateral segment without ischemia, EF 49%.  I last saw the patient on 12/02/2017, she was having a very atypical chest discomfort.  She sought medical attention in [facility] emergency room after having persistent chest soreness for several hours.  Troponin was negative at the time.  EKG showed no significant changes.  Given the atypical nature of her symptoms, I recommended observation for now.  She presents today for reassessment.  Patient presents today for  follow-up.  She has not had any further chest soreness.  She has been able to exercise during cardiac rehab without any exertional symptoms.  Her previous chest soreness occur more so with deep inspiration, therefore it was very atypical.  No further work-up is necessary at this time.  She does not have any significant lower extremity edema, orthopnea or PND.  She is due for repeat fasting lipid panel by her primary care provider before her next visit with Dr. Martinique.  She has no lower extremity edema, orthopnea or PND.   Past Medical History:  Diagnosis Date  . Allergy   . Alopecia   . Arthritis   . CAD (coronary artery disease)    a. 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 Cyndia Bent   . Cataract   . Chronic systolic CHF (congestive heart failure) ([place])    a. 03/2014 Echo: EF 25-30%; Pre op TEE 05/2014 with EF of 50%  . Family history of cardiovascular disease   . Family history of colon cancer   . History of long-term treatment with high-risk medication   . Hyperlipidemia   . Hypertension   . Insomnia   . Ischemic cardiomyopathy    a. 03/2014 Echo:  EF 25-30%, mid-apical/anteroseptal and inf AK, Gr 2 DD, ? apical thrombus, Mild MR.-->Discharged with LifeVest.  . Malaise and fatigue   . Myocardial infarction ([place]) 03/2014  . Shortness of breath dyspnea   . Sinus headache     Past Surgical History:  Procedure Laterality Date  . CARDIAC CATHETERIZATION  2001   showed no blockages  . CATARACT EXTRACTION W/PHACO Left 01/04/2014   Procedure: CATARACT EXTRACTION PHACO AND INTRAOCULAR LENS PLACEMENT (IOC);  Surgeon: Tonny Branch, MD;  Location: [facility];  Service: Ophthalmology;  Laterality: Left;  CDE:5.65  . CATARACT EXTRACTION W/PHACO Right 01/29/2014   Procedure: CATARACT EXTRACTION  PHACO AND INTRAOCULAR LENS PLACEMENT RIGHT EYE CDE=4.49;  Surgeon: Tonny Branch, MD;  Location: [facility];  Service: Ophthalmology;  Laterality: Right;  . CERVICAL SPINE SURGERY  09/27/2003   C6-67 servical fusion  . COLONOSCOPY WITH PROPOFOL N/A 05/18/2016   Procedure: COLONOSCOPY WITH PROPOFOL;  Surgeon: Garlan Fair, MD;  Location: [facility];  Service: Endoscopy;  Laterality: N/A;  . CORONARY ARTERY BYPASS GRAFT N/A 05/21/2014   Procedure: CORONARY ARTERY BYPASS GRAFTING (CABG);  Surgeon: Gaye Pollack, MD;  Location: [place];  Service: Open Heart Surgery;  Laterality: N/A;  . CYSTECTOMY  1998   fibroid cysts  . DILATION AND CURETTAGE OF UTERUS  1976   2nd to miscarriage  . EYE SURGERY    . INTRAOPERATIVE TRANSESOPHAGEAL ECHOCARDIOGRAM N/A 05/21/2014   Procedure: INTRAOPERATIVE TRANSESOPHAGEAL ECHOCARDIOGRAM;  Surgeon: Gaye Pollack, MD;  Location: [facility] OR;  Service: Open Heart Surgery;  Laterality: N/A;  . LEFT HEART CATHETERIZATION WITH CORONARY ANGIOGRAM N/A 03/08/2014   Procedure: LEFT HEART CATHETERIZATION WITH CORONARY ANGIOGRAM;  Surgeon: Peter M Martinique, MD;  Location: [facility] CATH LAB;  Service: Cardiovascular;  Laterality: N/A;  . 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 PERCUTANEOUS CORONARY STENT INTERVENTION (PCI-S)  03/08/2014   Procedure: PERCUTANEOUS CORONARY STENT INTERVENTION (PCI-S);  Surgeon: Peter M Martinique, MD;  Location: [facility] CATH LAB;  Service: Cardiovascular;;  prox lad  bms    Current Medications: Outpatient Medications Prior to Visit  Medication Sig Dispense Refill  . acetaminophen (TYLENOL) 500 MG tablet Take 500 mg by mouth every 6 (six) hours as needed for moderate pain or headache.     Marland Kitchen aspirin EC 81 MG tablet Take 81 mg by mouth at bedtime.     . Cholecalciferol (VITAMIN D3 PO) Take 50 mcg by mouth.     . Coenzyme Q10-Vitamin E (QUNOL ULTRA COQ10 PO) Take by mouth daily. 2 teaspoons    . Cyanocobalamin (VITAMIN B 12 PO) Take  1,000 mcg by mouth.     . docusate sodium (COLACE) 100 MG capsule Take 100-200 mg by mouth 2 (two) times daily. Take 100mg s in the morning and 200mg s at night    . eszopiclone (LUNESTA) 1 MG TABS tablet TAKE A HALF TABLET AT BEDTIME AS NEEDED 30 tablet 2  . furosemide (LASIX) 20 MG tablet TAKE 1 TO 2 TABLETS DAILY 180 tablet 1  . losartan (COZAAR) 50 MG tablet TAKE 1 TABLET BY MOUTH EVERY DAY 90 tablet 2  . nitroGLYCERIN (NITROSTAT) 0.4 MG SL tablet PLACE 1 TABLET (0.4 MG TOTAL) UNDER THE TONGUE EVERY 5 (FIVE) MINUTES AS NEEDED FOR CHEST PAIN. 25 tablet 3  . rosuvastatin (CRESTOR) 10 MG tablet TAKE 1 TABLET (10 MG TOTAL) BY MOUTH DAILY. (Patient taking differently: Take 10mg s daily at night) 90 tablet 1  . tetrahydrozoline-zinc (VISINE-AC)  0.05-0.25 % ophthalmic solution Place 2 drops into both eyes daily as needed (Dry eyes).     No facility-administered medications prior to visit.      Allergies:   Carvedilol; Spironolactone; Demerol [meperidine]; Lipitor [atorvastatin]; Lisinopril; Meperidine hcl; Plavix [clopidogrel bisulfate]; Dexilant [dexlansoprazole]; Latex; Neomycin; Sulfonamide derivatives; and Ticagrelor   Social History   Socioeconomic History  . Marital status: Married    Spouse name: Not on file  . Number of children: Not on file  . Years of education: Not on file  . Highest education level: Not on file  Occupational History  . Not on file  Social Needs  . Financial resource strain: Not on file  . Food insecurity:    Worry: Not on file    Inability: Not on file  . Transportation needs:    Medical: Not on file    Non-medical: Not on file  Tobacco Use  . Smoking status: Never Smoker  . Smokeless tobacco: Never Used  Substance and Sexual Activity  . Alcohol use: No    Comment: rare glass of wine  . Drug use: No  . Sexual activity: Not Currently    Birth control/protection: Post-menopausal  Lifestyle  . Physical activity:    Days per week: Not on file    Minutes  per session: Not on file  . Stress: Not on file  Relationships  . Social connections:    Talks on phone: Not on file    Gets together: Not on file    Attends religious service: Not on file    Active member of club or organization: Not on file    Attends meetings of clubs or organizations: Not on file    Relationship status: Not on file  Other Topics Concern  . Not on file  Social History Narrative  . Not on file     Family History:  The patient's family history includes Atrial fibrillation in her father; Cancer in her brother, brother, and father; Colon cancer in her father; Coronary artery disease in her brother and brother; Hearing loss in her father; Heart attack in her mother; Heart disease in her mother; Hypertension in her brother, brother, brother, brother, and father; Pancreatic cancer in her brother; Peripheral vascular disease in her father; Stroke in her mother; Thyroid disease in her brother, father, and sister.   ROS:   Please see the history of present illness.    ROS All other systems reviewed and are negative.   PHYSICAL EXAM:   VS:  BP 126/65   Pulse (!) 53   Ht 5\' 5"  (1.651 m)   Wt 159 lb (72.1 kg)   BMI 26.46 kg/m    GEN: Well nourished, well developed, in no acute distress  HEENT: normal  Neck: no JVD, carotid bruits, or masses Cardiac: RRR; no murmurs, rubs, or gallops,no edema  Respiratory:  clear to auscultation bilaterally, normal work of breathing GI: soft, nontender, nondistended, + BS MS: no deformity or atrophy  Skin: warm and dry, no rash Neuro:  Alert and Oriented x 3, Strength and sensation are intact Psych: euthymic mood, full affect  Wt Readings from Last 3 Encounters:  12/28/17 159 lb (72.1 kg)  12/02/17 160 lb (72.6 kg)  12/01/17 162 lb (73.5 kg)      Studies/Labs Reviewed:   EKG:  EKG is not ordered today.   Recent Labs: 03/24/2017: ALT 10; BUN 17; Creat 0.74; Hemoglobin 14.0; Platelets 185; Potassium 4.5; Sodium 141   Lipid  Panel  Component Value Date/Time   CHOL 154 03/24/2017 1117   TRIG 83 03/24/2017 1117   HDL 64 03/24/2017 1117   CHOLHDL 2.4 03/24/2017 1117   VLDL 25 07/14/2016 1211   LDLCALC 73 03/24/2017 1117    Additional studies/ records that were reviewed today include:   Echo 08/30/2014 LV EF: 35% -  40% Study Conclusions  - Left ventricle: Poor acoustical windows with multiple wall motion abnormalities. Recommend limited study with definity contrast agent for more accurate assessment. There is distal septal akinesis. The cavity size was normal. Systolic function was moderately reduced. The estimated ejection fraction was in the range of 35% to 40%. There is akinesis of the apical myocardium. There is akinesis of the apicalanterior myocardium. There is akinesis of the midanteroseptal myocardium. There is akinesis of the apicallateral myocardium. There was a reduced contribution of atrial contraction to ventricular filling, due to increased ventricular diastolic pressure or atrial contractile dysfunction. Doppler parameters are consistent with a reversible restrictive pattern, indicative of decreased left ventricular diastolic compliance and/or increased left atrial pressure (grade 3 diastolic dysfunction). - Aortic valve: Trileaflet; normal thickness, mildly calcified leaflets. - Mitral valve: There was mild regurgitation. - Left atrium: The atrium was mildly dilated. - Pulmonic valve: There was mild regurgitation.   Myoview 07/10/2015 Study Highlights    Nuclear stress EF: 49%.  The left ventricular ejection fraction is mildly decreased (45-54%).  There was no ST segment deviation noted during stress.  Defect 1: There is a defect present in the mid anteroseptal, mid inferolateral, apical septal, apical lateral and apex location.  Findings consistent with prior myocardial infarction.  This is a low risk study.   1. Low risk study 2. Scar  antero septal, apical and inferolateral. 3. Low nl EF     ASSESSMENT:    1. Coronary artery disease involving coronary bypass graft of native heart without angina pectoris   2. Chronic systolic heart failure ([place])   3. Essential hypertension   4. Hyperlipidemia, unspecified hyperlipidemia type   5. Ischemic cardiomyopathy      PLAN:  In order of problems listed above:  1. CAD: Last Myoview 2017.  Previously patient was seen in [place] to ED for atypical chest pain, troponin was negative.  We recommended observation, patient returns today for visit.  She has not had any further chest discomfort.  Her previous chest pain was worse with deep inspiration.  No further work-up is needed at this time.  Continue aspirin and statin  2. Chronic systolic heart failure: No obvious heart failure symptoms  3. Hypertension: Blood pressure stable  4. Hyperlipidemia: On Crestor 10 mg daily.  Due for repeat lab work by primary care provider.  5. Ischemic cardiomyopathy: On losartan, unable to tolerate beta-blocker due to baseline bradycardia.   Medication Adjustments/Labs and Tests Ordered: Current medicines are reviewed at length with the patient today.  Concerns regarding medicines are outlined above.  Medication changes, Labs and Tests ordered today are listed in the Patient Instructions below. Patient Instructions  Medication Instructions:  Your physician recommends that you continue on your current medications as directed. Please refer to the Current Medication list given to you today.  Labwork: NONE   Testing/Procedures: NONE   Follow-Up: FOLLOW UP AS SCHEDULED  Any Other Special Instructions Will Be Listed Below (If Applicable).  If you need a refill on your cardiac medications before your next appointment, please call your pharmacy.     Hilbert Corrigan, [place]  12/30/2017 7:49 PM  [facility], [place], [place]  [address] Phone: [phone]; Fax: [phone]

## 2017-12-28 NOTE — Patient Instructions (Signed)
Medication Instructions:  Your physician recommends that you continue on your current medications as directed. Please refer to the Current Medication list given to you today.  Labwork: NONE   Testing/Procedures: NONE   Follow-Up: FOLLOW UP AS SCHEDULED  Any Other Special Instructions Will Be Listed Below (If Applicable). If you need a refill on your cardiac medications before your next appointment, please call your pharmacy.  

## 2017-12-30 ENCOUNTER — Encounter: Payer: Self-pay | Admitting: Physician Assistant

## 2018-03-01 ENCOUNTER — Emergency Department (HOSPITAL_COMMUNITY)
Admission: EM | Admit: 2018-03-01 | Discharge: 2018-03-01 | Disposition: A | Discharge status: Home / Self Care | Payer: PPO | Attending: Emergency Medicine | Admitting: Emergency Medicine

## 2018-03-01 ENCOUNTER — Telehealth: Payer: Self-pay | Admitting: Cardiology

## 2018-03-01 ENCOUNTER — Encounter (HOSPITAL_COMMUNITY): Payer: Self-pay | Admitting: Emergency Medicine

## 2018-03-01 ENCOUNTER — Other Ambulatory Visit: Payer: Self-pay

## 2018-03-01 DIAGNOSIS — Z91040 Latex allergy status: Secondary | ICD-10-CM | POA: Diagnosis not present

## 2018-03-01 DIAGNOSIS — Z79899 Other long term (current) drug therapy: Secondary | ICD-10-CM | POA: Insufficient documentation

## 2018-03-01 DIAGNOSIS — I110 Hypertensive heart disease with heart failure: Secondary | ICD-10-CM | POA: Insufficient documentation

## 2018-03-01 DIAGNOSIS — R51 Headache: Secondary | ICD-10-CM | POA: Diagnosis not present

## 2018-03-01 DIAGNOSIS — Z951 Presence of aortocoronary bypass graft: Secondary | ICD-10-CM | POA: Insufficient documentation

## 2018-03-01 DIAGNOSIS — R519 Headache, unspecified: Secondary | ICD-10-CM

## 2018-03-01 DIAGNOSIS — I10 Essential (primary) hypertension: Secondary | ICD-10-CM | POA: Diagnosis not present

## 2018-03-01 DIAGNOSIS — I5042 Chronic combined systolic (congestive) and diastolic (congestive) heart failure: Secondary | ICD-10-CM | POA: Insufficient documentation

## 2018-03-01 DIAGNOSIS — Z7982 Long term (current) use of aspirin: Secondary | ICD-10-CM | POA: Diagnosis not present

## 2018-03-01 DIAGNOSIS — I2510 Atherosclerotic heart disease of native coronary artery without angina pectoris: Secondary | ICD-10-CM | POA: Diagnosis not present

## 2018-03-01 DIAGNOSIS — Z955 Presence of coronary angioplasty implant and graft: Secondary | ICD-10-CM | POA: Insufficient documentation

## 2018-03-01 LAB — CBC
HEMATOCRIT: 42 % (ref 36.0–46.0)
Hemoglobin: 13.6 g/dL (ref 12.0–15.0)
MCH: 31 pg (ref 26.0–34.0)
MCHC: 32.4 g/dL (ref 30.0–36.0)
MCV: 95.7 fL (ref 78.0–100.0)
Platelets: 171 10*3/uL (ref 150–400)
RBC: 4.39 MIL/uL (ref 3.87–5.11)
RDW: 12.6 % (ref 11.5–15.5)
WBC: 5.7 10*3/uL (ref 4.0–10.5)

## 2018-03-01 LAB — BASIC METABOLIC PANEL
Anion gap: 11 (ref 5–15)
BUN: 13 mg/dL (ref 8–23)
CALCIUM: 9.9 mg/dL (ref 8.9–10.3)
CO2: 24 mmol/L (ref 22–32)
CREATININE: 0.84 mg/dL (ref 0.44–1.00)
Chloride: 107 mmol/L (ref 98–111)
GFR calc non Af Amer: >60 mL/min (ref >60)
Glucose, Bld: 101 mg/dL — ABNORMAL HIGH (ref 70–99)
Potassium: 3.9 mmol/L (ref 3.5–5.1)
Sodium: 142 mmol/L (ref 135–145)

## 2018-03-01 LAB — I-STAT TROPONIN, ED: Troponin i, poc: 0.04 ng/mL (ref 0.00–0.08)

## 2018-03-01 MED ORDER — AMLODIPINE BESYLATE 5 MG PO TABS
5.0000 mg | ORAL_TABLET | Freq: Once | ORAL | Status: AC
  Administered 2018-03-01: 5 mg via ORAL
  Filled 2018-03-01: qty 1

## 2018-03-01 NOTE — ED Triage Notes (Signed)
Pt reports a headache that started this morning. Pt has htn and takes Losartan. Pt reports taking a second dose after she checked her BP which was 211/115. Pt reports some shakiness and 3-4 looser stools today, reports starting a new brand of prune juice which could have caused it. Also intermittent left neck soreness which she states may have been from sleeping last night.

## 2018-03-01 NOTE — Telephone Encounter (Signed)
Spoke with pt. Pt sts that she has had a headache all day. Pt denies vision disturbance, epistaxis.  Pt sts that she has checked her BP twice in the last hour Pt BP readings: 185/110 211/115  Pt sts that she has taken Tylenol for her headache with no relief. Pt sts that she has taken her Losartan and Lasix today.  Asked pt to recheck her BP while I was on the phone. Pt sts that the machine read error x2. Her husband attempted to ck his BP and was successful.  Adv pt that I recommend she go the the ED for further evaluation of her BP. Pt husband will drive her to [facility] ED now.

## 2018-03-01 NOTE — Discharge Instructions (Addendum)
Continue your current medications, monitor your blood pressure, follow up with your primary care doctor later this week

## 2018-03-01 NOTE — ED Provider Notes (Signed)
[facility] EMERGENCY DEPARTMENT Provider Note   CSN: [id number] Arrival date & time: 03/01/18  1724     History   Chief Complaint Chief Complaint  Patient presents with  . Headache  . Hypertension    HPI Kelli Roy is a 72 y.o. female.  HPI Pt presented to the ED for htn.  Pt states she woke up with a headache this amk.  She decided to taker her blood pressure and noted it was high.  She took another bp medication because she was not sure she had taken her dose.  She called her doctor and was told to come to the ED.  No CP or SOB.  No vomiting, no diarrhea, some loose stool.  No numbness or weakness.  No trouble with speech. Past Medical History:  Diagnosis Date  . Allergy   . Alopecia   . Arthritis   . CAD (coronary artery disease)    a. 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 Cyndia Bent   . Cataract   . Chronic systolic CHF (congestive heart failure) ([place])    a. 03/2014 Echo: EF 25-30%; Pre op TEE 05/2014 with EF of 50%  . Family history of cardiovascular disease   . Family history of colon cancer   . History of long-term treatment with high-risk medication   . Hyperlipidemia   . Hypertension   . Insomnia   . Ischemic cardiomyopathy    a. 03/2014 Echo: EF 25-30%, mid-apical/anteroseptal and inf AK, Gr 2 DD, ? apical thrombus, Mild MR.-->Discharged with LifeVest.  . Malaise and fatigue   . Myocardial infarction ([place]) 03/2014  . Shortness of breath dyspnea   . Sinus headache     Patient Active Problem List   Diagnosis Date Noted  . Osteopenia 04/29/2017  . Allergic rhinitis 03/24/2017  . Bradycardia 03/24/2017  . Constipation 07/05/2015  . Hemorrhoids 07/04/2015  . Chronic systolic CHF (congestive heart failure) ([place]) 07/26/2014  .  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 Hyperlipidemia 03/12/2014  . CAD (coronary artery disease), native coronary artery 03/12/2014  . Cardiomyopathy, ischemic 03/12/2014  . Acute on chronic combined systolic (EF 20-25%) and diastolic heart failure (NYHA 2) 03/09/2014  . ST elevation myocardial infarction (STEMI) involving left anterior descending (LAD) coronary artery with complication ([place]) 42/70/6237  . DYSPNEA 07/03/2009  . Essential hypertension 06/19/2009  . Insomnia 06/19/2009    Past Surgical History:  Procedure Laterality Date  . CARDIAC CATHETERIZATION  2001   showed no blockages  . CATARACT EXTRACTION W/PHACO Left 01/04/2014   Procedure: CATARACT EXTRACTION PHACO AND INTRAOCULAR LENS PLACEMENT (IOC);  Surgeon: Tonny Branch, MD;  Location: [facility];  Service: Ophthalmology;  Laterality: Left;  CDE:5.65  . CATARACT EXTRACTION W/PHACO Right 01/29/2014   Procedure: CATARACT EXTRACTION PHACO AND INTRAOCULAR LENS PLACEMENT RIGHT EYE CDE=4.49;  Surgeon: Tonny Branch, MD;  Location: [facility];  Service: Ophthalmology;  Laterality: Right;  . CERVICAL SPINE SURGERY  09/27/2003   C6-67 servical fusion  . COLONOSCOPY WITH PROPOFOL N/A 05/18/2016   Procedure: COLONOSCOPY WITH PROPOFOL;  Surgeon: Garlan Fair, MD;  Location: [facility];  Service: Endoscopy;  Laterality: N/A;  . CORONARY ARTERY BYPASS GRAFT N/A 05/21/2014   Procedure: CORONARY ARTERY BYPASS GRAFTING (CABG);  Surgeon: Gaye Pollack, MD;  Location: [facility];  Service: Open Heart Surgery;  Laterality: N/A;  . CYSTECTOMY  1998   fibroid cysts  . DILATION AND CURETTAGE OF UTERUS  1976   2nd to miscarriage  . EYE SURGERY    . INTRAOPERATIVE TRANSESOPHAGEAL ECHOCARDIOGRAM N/A 05/21/2014   Procedure: INTRAOPERATIVE TRANSESOPHAGEAL ECHOCARDIOGRAM;  Surgeon: Gaye Pollack, MD;  Location: [facility] OR;  Service: Open Heart Surgery;  Laterality: N/A;  . LEFT HEART CATHETERIZATION WITH CORONARY ANGIOGRAM N/A 03/08/2014   Procedure: LEFT HEART CATHETERIZATION  WITH CORONARY ANGIOGRAM;  Surgeon: Peter M Martinique, MD;  Location: [facility] CATH LAB;  Service: Cardiovascular;  Laterality: N/A;  . 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 PERCUTANEOUS CORONARY STENT INTERVENTION (PCI-S)  03/08/2014   Procedure: PERCUTANEOUS CORONARY STENT INTERVENTION (PCI-S);  Surgeon: Peter M Martinique, MD;  Location: [facility] CATH LAB;  Service: Cardiovascular;;  prox lad  bms     OB History   None      Home Medications    Prior to Admission medications   Medication Sig Start Date End Date Taking? Authorizing Provider  acetaminophen (TYLENOL) 500 MG tablet Take 500 mg by mouth every 6 (six) hours as needed for moderate pain or headache.    Yes [provider]  aspirin EC 81 MG tablet Take 81 mg by mouth at bedtime.    Yes [provider]  Cholecalciferol (VITAMIN D3 PO) Take 1 capsule by mouth daily.    Yes [provider]  Coenzyme Q10-Vitamin E (QUNOL ULTRA COQ10 PO) Take 200 mg by mouth at bedtime.    Yes [provider]  Cyanocobalamin (VITAMIN B12) 1000 MCG TBCR Take 1,000 mcg by mouth daily.   Yes [provider]  docusate sodium (COLACE) 100 MG capsule Take 100-200 mg by mouth 2 (two) times daily. Take 100 mg by mouth in the morning and 200 mg at bedtime   Yes [provider]  eszopiclone (LUNESTA) 1 MG TABS tablet TAKE A HALF TABLET AT BEDTIME AS NEEDED 10/01/17  Yes Napoleon, Modena Nunnery, MD  furosemide (LASIX) 20 MG tablet TAKE 1 TO 2 TABLETS DAILY Patient taking differently: Take 20 mg by mouth See admin instructions. Take 20 mg by mouth once a day and may take an additional 20 mg as needed for fluid retention 11/11/17  Yes Martinique, Peter M, MD  loratadine (CLARITIN) 10 MG tablet Take 10 mg by mouth daily as needed for allergies or rhinitis.   Yes [provider]  losartan (COZAAR) 50 MG tablet TAKE 1 TABLET BY MOUTH EVERY DAY 10/07/17  Yes Martinique, Peter M, MD  nitroGLYCERIN  (NITROSTAT) 0.4 MG SL tablet PLACE 1 TABLET (0.4 MG TOTAL) UNDER THE TONGUE EVERY 5 (FIVE) MINUTES AS NEEDED FOR CHEST PAIN. 12/16/16  Yes Martinique, Peter M, MD  rosuvastatin (CRESTOR) 10 MG tablet TAKE 1 TABLET (10 MG TOTAL) BY MOUTH DAILY. Patient taking differently: Take 10 mg by mouth at bedtime.  01/06/16  Yes Martinique, Peter M, MD  tetrahydrozoline-zinc (VISINE-AC) 0.05-0.25 % ophthalmic solution Place 2 drops into both eyes daily as needed (for dry or irritated eyes).    Yes [provider]    Family History Family History  Problem Relation Age of Onset  . Stroke Mother   . Heart attack Mother   . Heart disease Mother   . Peripheral vascular disease Father        had stent in leg  .  Colon cancer Father   . Atrial fibrillation Father        with pacemaker  . Thyroid disease Father   . Cancer Father   . Hearing loss Father   . Hypertension Father   . Pancreatic cancer Brother   . Cancer Brother   . Hypertension Brother   . Coronary artery disease Brother        with Stent  . Cancer Brother   . Hypertension Brother   . Coronary artery disease Brother        with CABG in his 4's  . Hypertension Brother   . Thyroid disease Brother   . Hypertension Brother   . Thyroid disease Sister     Social History Social History   Tobacco Use  . Smoking status: Never Smoker  . Smokeless tobacco: Never Used  Substance Use Topics  . Alcohol use: No    Comment: rare glass of wine  . Drug use: No     Allergies   Carvedilol; Spironolactone; Demerol [meperidine]; Lipitor [atorvastatin]; Lisinopril; Meperidine hcl; Dexilant [dexlansoprazole]; Latex; Neomycin; Plavix [clopidogrel bisulfate]; Sulfonamide derivatives; and Ticagrelor   Review of Systems Review of Systems  All other systems reviewed and are negative.    Physical Exam Updated Vital Signs BP (!) 153/58   Pulse (!) 45   Temp 98.1 F (36.7 C) (Oral)   Resp 13   Ht 1.651 m (5\' 5" )   Wt 72.6 kg   SpO2 97%    BMI 26.63 kg/m   Physical Exam  Constitutional: She appears well-developed and well-nourished. No distress.  HENT:  Head: Normocephalic and atraumatic.  Right Ear: External ear normal.  Left Ear: External ear normal.  Eyes: Conjunctivae are normal. Right eye exhibits no discharge. Left eye exhibits no discharge. No scleral icterus.  Neck: Neck supple. No tracheal deviation present.  Cardiovascular: Normal rate, regular rhythm and intact distal pulses.  Pulmonary/Chest: Effort normal and breath sounds normal. No stridor. No respiratory distress. She has no wheezes. She has no rales.  Abdominal: Soft. Bowel sounds are normal. She exhibits no distension. There is no tenderness. There is no rebound and no guarding.  Musculoskeletal: She exhibits no edema or tenderness.  Neurological: She is alert. She has normal strength. No cranial nerve deficit (no facial droop, extraocular movements intact, no slurred speech) or sensory deficit. She exhibits normal muscle tone. She displays no seizure activity. Coordination normal.  Skin: Skin is warm and dry. No rash noted.  Psychiatric: She has a normal mood and affect.  Nursing note and vitals reviewed.    ED Treatments / Results  Labs (all labs ordered are listed, but only abnormal results are displayed) Labs Reviewed  BASIC METABOLIC PANEL - Abnormal; Notable for the following components:      Result Value   Glucose, Bld 101 (*)    All other components within normal limits  CBC  I-STAT TROPONIN, ED    EKG EKG Interpretation  Date/Time:  Tuesday March 01 2018 20:58:43 EDT Ventricular Rate:  73 PR Interval:    QRS Duration: 91 QT Interval:  436 QTC Calculation: 425 R Axis:   62 Text Interpretation:  Age not entered, assumed to be  72 years old for purpose of ECG interpretation Sinus rhythm Anteroseptal infarct, age indeterminate Baseline wander in lead(s) II III aVF No significant change since last tracing Confirmed by Dorie Rank  [phone]) on 03/01/2018 9:06:21 PM   Radiology No results found.  Procedures Procedures (including critical care time)  Medications Ordered in ED Medications  amLODipine (NORVASC) tablet 5 mg (5 mg Oral Given 03/01/18 2055)     Initial Impression / Assessment and Plan / ED Course  I have reviewed the triage vital signs and the nursing notes.  Pertinent labs & imaging results that were available during my care of the patient were reviewed by me and considered in my medical decision making (see chart for details).   Patient presented to the emergency room for evaluation of headache and hypertension.  Patient's ED work-up is reassuring.  Labs are normal.  EKG does not show any acute changes.  Patient was given initial dose of blood pressure medications and her most recent blood pressure is 153/58.  No signs to suggest any endorgan ischemia.  No findings to suggest stroke or any other acute neurologic complication.  Patient appears stable for discharge.  I will have her monitor blood pressure and follow-up with your doctor this week.  Final Clinical Impressions(s) / ED Diagnoses   Final diagnoses:  Hypertension, unspecified type  Acute nonintractable headache, unspecified headache type    ED Discharge Orders    None       Dorie Rank, MD 03/01/18 2312

## 2018-03-01 NOTE — Telephone Encounter (Signed)
New Message:    Pt c/o BP issue: STAT if pt c/o blurred vision, one-sided weakness or slurred speech  1. What are your last 5 BP readings? 185/110 and 211/115  2. Are you having any other symptoms (ex. Dizziness, headache, blurred vision, passed out)? Headache  3. What is your BP issue? Pt states her BP is high

## 2018-03-02 ENCOUNTER — Telehealth: Payer: Self-pay | Admitting: Cardiology

## 2018-03-02 NOTE — Progress Notes (Signed)
Cardiology Office Note    Date:  03/03/2018   ID:  Kelli Roy, DOB [birth year], MRN [id number]  PCP:  Alycia Rossetti, MD  Cardiologist:  Dr. Martinique  Chief Complaint  Patient presents with  . Follow-up    Elevated BP.  Marland Kitchen Hypertension    History of Present Illness:  Kelli Roy is a 72 y.o. female is seen for evaluation of HTN. She has a PMH of CAD, chronic systolic heart failure, hypertension, hyperlipidemia, and history of ischemic cardiomyopathy.  Patient had anterior STEMI in September 2015 and underwent emergent stenting of the proximal LAD with a BMS.  She also had 50 to 70% left main disease, 70 to 80% ostial LAD disease, 50 to 70% ostial left circumflex disease, EF was 25 to 30%.  She was placed on high-dose Lipitor therapy but noted to have marked elevation of her transaminases and her statin was discontinued.  Since then, she has been tolerating Crestor.  She eventually returned in November 2015 for CABG by Dr. Cyndia Bent was LIMA to LAD, SVG to diagonal, SVG to OM, SVG to RCA.  She has a history of a rash reaction to both Brilinta and Plavix.  Echocardiogram in February 2016 showed EF 35 to 40% with akinesis to the anteroseptum and apex.  She developed acute heart failure in March 2016.  She was diuresed with IV Lasix and it was discharged home with addition of Lasix and Aldactone.  She later developed hypotension requiring the discontinuation of Coreg and Aldactone.  She had a Myoview in January 2017 that showed scar in the anteroseptal, apical and inferolateral segment without ischemia, EF 49%.  She was seen on 12/02/2017, she was having a very atypical chest discomfort.  She sought medical attention in [facility] emergency room after having persistent chest soreness for several hours.  Troponin was negative at the time.  EKG showed no significant changes.  Given the atypical nature of her symptoms no further work up recommended.  She was doing well until she awoke  Tuesday am with severe frontal HA. BP was markedly elevated to 211/115. She went to ED and was given amlodipine with improvement. Labs and Ecg were OK. Notes BP typically a little high in the morning but usually comes down after taking meds. When she goes to Cardiac Rehab BP may be elevated at the start but after exercise may drop to 42A systolic. When she first went to Rehab yesterday BP was 154/62 but repeat was 126/60. Her HA has resolved. No chest pain or dyspnea.    Past Medical History:  Diagnosis Date  . Allergy   . Alopecia   . Arthritis   . CAD (coronary artery disease)    a. 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 Cyndia Bent   . Cataract   . Chronic systolic CHF (congestive heart failure) ([place])    a. 03/2014 Echo: EF 25-30%; Pre op TEE 05/2014 with EF of 50%  . Family history of cardiovascular disease   . Family history of colon cancer   . History of long-term treatment with high-risk medication   . Hyperlipidemia   . Hypertension   . Insomnia   . Ischemic cardiomyopathy    a. 03/2014 Echo: EF  25-30%, mid-apical/anteroseptal and inf AK, Gr 2 DD, ? apical thrombus, Mild MR.-->Discharged with LifeVest.  . Malaise and fatigue   . Myocardial infarction ([place]) 03/2014  . Shortness of breath dyspnea   . Sinus headache     Past Surgical History:  Procedure Laterality Date  . CARDIAC CATHETERIZATION  2001   showed no blockages  . CATARACT EXTRACTION W/PHACO Left 01/04/2014   Procedure: CATARACT EXTRACTION PHACO AND INTRAOCULAR LENS PLACEMENT (IOC);  Surgeon: Tonny Branch, MD;  Location: [facility];  Service: Ophthalmology;  Laterality: Left;  CDE:5.65  . CATARACT EXTRACTION W/PHACO Right 01/29/2014   Procedure: CATARACT EXTRACTION PHACO AND INTRAOCULAR LENS PLACEMENT RIGHT EYE  CDE=4.49;  Surgeon: Tonny Branch, MD;  Location: [facility];  Service: Ophthalmology;  Laterality: Right;  . CERVICAL SPINE SURGERY  09/27/2003   C6-67 servical fusion  . COLONOSCOPY WITH PROPOFOL N/A 05/18/2016   Procedure: COLONOSCOPY WITH PROPOFOL;  Surgeon: Garlan Fair, MD;  Location: [facility];  Service: Endoscopy;  Laterality: N/A;  . CORONARY ARTERY BYPASS GRAFT N/A 05/21/2014   Procedure: CORONARY ARTERY BYPASS GRAFTING (CABG);  Surgeon: Gaye Pollack, MD;  Location: [place];  Service: Open Heart Surgery;  Laterality: N/A;  . CYSTECTOMY  1998   fibroid cysts  . DILATION AND CURETTAGE OF UTERUS  1976   2nd to miscarriage  . EYE SURGERY    . INTRAOPERATIVE TRANSESOPHAGEAL ECHOCARDIOGRAM N/A 05/21/2014   Procedure: INTRAOPERATIVE TRANSESOPHAGEAL ECHOCARDIOGRAM;  Surgeon: Gaye Pollack, MD;  Location: [facility] OR;  Service: Open Heart Surgery;  Laterality: N/A;  . LEFT HEART CATHETERIZATION WITH CORONARY ANGIOGRAM N/A 03/08/2014   Procedure: LEFT HEART CATHETERIZATION WITH CORONARY ANGIOGRAM;  Surgeon: Shatima Zalar M Martinique, MD;  Location: [facility] CATH LAB;  Service: Cardiovascular;  Laterality: N/A;  . 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 PERCUTANEOUS CORONARY STENT INTERVENTION (PCI-S)  03/08/2014   Procedure: PERCUTANEOUS CORONARY STENT INTERVENTION (PCI-S);  Surgeon: Dwight Burdo M Martinique, MD;  Location: [facility] CATH LAB;  Service: Cardiovascular;;  prox lad  bms    Current Medications: Outpatient Medications Prior to Visit  Medication Sig Dispense Refill  . acetaminophen (TYLENOL) 500 MG tablet Take 500 mg by mouth every 6 (six) hours as needed for moderate pain or headache.     Marland Kitchen aspirin EC 81 MG tablet Take 81 mg by mouth at bedtime.     . Cholecalciferol (VITAMIN D3 PO) Take 1 capsule by mouth daily.     . Coenzyme Q10-Vitamin E (QUNOL ULTRA COQ10 PO) Take 200 mg by mouth at bedtime.     . Cyanocobalamin (VITAMIN B12) 1000 MCG TBCR Take 1,000 mcg by mouth daily.      Marland Kitchen docusate sodium (COLACE) 100 MG capsule Take 100-200 mg by mouth 2 (two) times daily. Take 100 mg by mouth in the morning and 200 mg at bedtime    . eszopiclone (LUNESTA) 1 MG TABS tablet TAKE A HALF TABLET AT BEDTIME AS NEEDED 30 tablet 2  . furosemide (LASIX) 20 MG tablet TAKE 1 TO 2 TABLETS DAILY (Patient taking differently: Take 20 mg by mouth See admin instructions. Take 20 mg by mouth once a day and may take an additional 20 mg as needed for fluid retention) 180 tablet 1  . loratadine (CLARITIN) 10 MG tablet Take 10 mg by mouth daily as needed for allergies or rhinitis.    Marland Kitchen losartan (COZAAR) 50 MG tablet TAKE 1 TABLET BY MOUTH EVERY DAY 90 tablet 2  .  nitroGLYCERIN (NITROSTAT) 0.4 MG SL tablet PLACE 1 TABLET (0.4 MG TOTAL) UNDER THE TONGUE EVERY 5 (FIVE) MINUTES AS NEEDED FOR CHEST PAIN. 25 tablet 3  . rosuvastatin (CRESTOR) 10 MG tablet TAKE 1 TABLET (10 MG TOTAL) BY MOUTH DAILY. (Patient taking differently: Take 10 mg by mouth at bedtime. ) 90 tablet 1  . tetrahydrozoline-zinc (VISINE-AC) 0.05-0.25 % ophthalmic solution Place 2 drops into both eyes daily as needed (for dry or irritated eyes).      No facility-administered medications prior to visit.      Allergies:   Carvedilol; Spironolactone; Demerol [meperidine]; Lipitor [atorvastatin]; Lisinopril; Meperidine hcl; Dexilant [dexlansoprazole]; Latex; Neomycin; Plavix [clopidogrel bisulfate]; Sulfonamide derivatives; and Ticagrelor   Social History   Socioeconomic History  . Marital status: Married    Spouse name: Not on file  . Number of children: Not on file  . Years of education: Not on file  . Highest education level: Not on file  Occupational History  . Not on file  Social Needs  . Financial resource strain: Not on file  . Food insecurity:    Worry: Not on file    Inability: Not on file  . Transportation needs:    Medical: Not on file    Non-medical: Not on file  Tobacco Use  . Smoking status: Never Smoker  .  Smokeless tobacco: Never Used  Substance and Sexual Activity  . Alcohol use: No    Comment: rare glass of wine  . Drug use: No  . Sexual activity: Not Currently    Birth control/protection: Post-menopausal  Lifestyle  . Physical activity:    Days per week: Not on file    Minutes per session: Not on file  . Stress: Not on file  Relationships  . Social connections:    Talks on phone: Not on file    Gets together: Not on file    Attends religious service: Not on file    Active member of club or organization: Not on file    Attends meetings of clubs or organizations: Not on file    Relationship status: Not on file  Other Topics Concern  . Not on file  Social History Narrative  . Not on file     Family History:  The patient's family history includes Atrial fibrillation in her father; Cancer in her brother, brother, and father; Colon cancer in her father; Coronary artery disease in her brother and brother; Hearing loss in her father; Heart attack in her mother; Heart disease in her mother; Hypertension in her brother, brother, brother, brother, and father; Pancreatic cancer in her brother; Peripheral vascular disease in her father; Stroke in her mother; Thyroid disease in her brother, father, and sister.   ROS:   Please see the history of present illness.    ROS All other systems reviewed and are negative.   PHYSICAL EXAM:   VS:  BP (!) 142/58 (BP Location: Right Arm, Patient Position: Sitting, Cuff Size: Normal)   Pulse (!) 50   Ht 5\' 5"  (1.651 m)   Wt 161 lb (73 kg)   BMI 26.79 kg/m    GEN: Well nourished, well developed, in no acute distress  HEENT: normal  Neck: no JVD, carotid bruits, or masses Cardiac: RRR; no murmurs, rubs, or gallops,no edema  Respiratory:  clear to auscultation bilaterally, normal work of breathing GI: soft, nontender, nondistended, + BS MS: no deformity or atrophy  Skin: warm and dry, no rash Neuro:  Alert and Oriented x 3,  Strength and sensation  are intact Psych: euthymic mood, full affect  Wt Readings from Last 3 Encounters:  03/03/18 161 lb (73 kg)  03/01/18 160 lb (72.6 kg)  12/28/17 159 lb (72.1 kg)      Studies/Labs Reviewed:   EKG:  EKG is not ordered today.   Recent Labs: 03/24/2017: ALT 10 03/01/2018: BUN 13; Creatinine, Ser 0.84; Hemoglobin 13.6; Platelets 171; Potassium 3.9; Sodium 142   Lipid Panel    Component Value Date/Time   CHOL 154 03/24/2017 1117   TRIG 83 03/24/2017 1117   HDL 64 03/24/2017 1117   CHOLHDL 2.4 03/24/2017 1117   VLDL 25 07/14/2016 1211   LDLCALC 73 03/24/2017 1117    Additional studies/ records that were reviewed today include:   Echo 08/30/2014 LV EF: 35% -  40% Study Conclusions  - Left ventricle: Poor acoustical windows with multiple wall motion abnormalities. Recommend limited study with definity contrast agent for more accurate assessment. There is distal septal akinesis. The cavity size was normal. Systolic function was moderately reduced. The estimated ejection fraction was in the range of 35% to 40%. There is akinesis of the apical myocardium. There is akinesis of the apicalanterior myocardium. There is akinesis of the midanteroseptal myocardium. There is akinesis of the apicallateral myocardium. There was a reduced contribution of atrial contraction to ventricular filling, due to increased ventricular diastolic pressure or atrial contractile dysfunction. Doppler parameters are consistent with a reversible restrictive pattern, indicative of decreased left ventricular diastolic compliance and/or increased left atrial pressure (grade 3 diastolic dysfunction). - Aortic valve: Trileaflet; normal thickness, mildly calcified leaflets. - Mitral valve: There was mild regurgitation. - Left atrium: The atrium was mildly dilated. - Pulmonic valve: There was mild regurgitation.   Myoview 07/10/2015 Study Highlights    Nuclear stress EF:  49%.  The left ventricular ejection fraction is mildly decreased (45-54%).  There was no ST segment deviation noted during stress.  Defect 1: There is a defect present in the mid anteroseptal, mid inferolateral, apical septal, apical lateral and apex location.  Findings consistent with prior myocardial infarction.  This is a low risk study.   1. Low risk study 2. Scar antero septal, apical and inferolateral. 3. Low nl EF     ASSESSMENT:    1. Essential hypertension   2. Hyperlipidemia, unspecified hyperlipidemia type   3. S/P CABG x 4   4. Coronary artery disease involving native coronary artery of native heart without angina pectoris      PLAN:  In order of problems listed above:  1. HTN. Marked BP elevation Tuesday which is unusual for her. History of intolerance to several medication. For now will switch to taking losartan in the pm and lasix in the am. Monitor daily BP at home and at Rehab. Report to me in one week. If BP still elevated may increase losartan dose.   2.   CAD: Last Myoview 2017.  Asymptomatic now.   3.   Chronic systolic heart failure: No obvious heart failure symptoms  4.   Hyperlipidemia: On Crestor 10 mg daily.  Due for repeat lab work by primary care provider.  5.   Ischemic cardiomyopathy: On losartan, unable to tolerate beta-blocker due to baseline bradycardia.   Medication Adjustments/Labs and Tests Ordered: Current medicines are reviewed at length with the patient today.  Concerns regarding medicines are outlined above.  Medication changes, Labs and Tests ordered today are listed in the Patient Instructions below. Patient Instructions  Switch to taking losartan  at bedtime and continue lasix in the morning.  Keep a check on your blood pressure.      Signed, Anhelica Fowers Martinique, MD  03/03/2018 5:51 PM    [facility] [place] [address], [place], [place]  [address] Phone: [phone]; Fax: [phone]

## 2018-03-02 NOTE — Telephone Encounter (Signed)
Returned call to patient no answer.Left message on personal voice mail keep appointment with Dr.Jordan tomorrow at 2:40 pm.Advised to bring all medications.

## 2018-03-02 NOTE — Telephone Encounter (Signed)
New Message        Patient called back with a bp reading  148/91 this was taking this morning, prior to her morning meds she has taking bp.

## 2018-03-02 NOTE — Telephone Encounter (Signed)
Called patient spoke to husband.Advised I was calling to see how wife was doing.He stated she is still asleep.Stated they got home from ED late.Advised after reviewing ED note she was advised to follow up with Dr.Jordan this week.Advised Dr.Jordan has a cancellation for tomorrow.Appointment scheduled 8/29 at 2:40 pm.Stated he will have her call me when she wakes up.

## 2018-03-03 ENCOUNTER — Encounter: Payer: Self-pay | Admitting: Cardiology

## 2018-03-03 ENCOUNTER — Ambulatory Visit: Payer: PPO | Admitting: Cardiology

## 2018-03-03 VITALS — BP 142/58 | HR 50 | Ht 65.0 in | Wt 161.0 lb

## 2018-03-03 DIAGNOSIS — I2510 Atherosclerotic heart disease of native coronary artery without angina pectoris: Secondary | ICD-10-CM | POA: Diagnosis not present

## 2018-03-03 DIAGNOSIS — E785 Hyperlipidemia, unspecified: Secondary | ICD-10-CM | POA: Diagnosis not present

## 2018-03-03 DIAGNOSIS — Z951 Presence of aortocoronary bypass graft: Secondary | ICD-10-CM

## 2018-03-03 DIAGNOSIS — I10 Essential (primary) hypertension: Secondary | ICD-10-CM

## 2018-03-03 NOTE — Patient Instructions (Signed)
Switch to taking losartan at bedtime and continue lasix in the morning.  Keep a check on your blood pressure.

## 2018-03-14 ENCOUNTER — Telehealth: Payer: Self-pay

## 2018-03-14 NOTE — Telephone Encounter (Signed)
Spoke to patient Dr.Jordan reviewed B/P readings you brought to office.He advised continue same medications,no changes.Advised keep appointment as planned.

## 2018-04-01 ENCOUNTER — Encounter: Payer: Self-pay | Admitting: Family Medicine

## 2018-04-01 ENCOUNTER — Ambulatory Visit (INDEPENDENT_AMBULATORY_CARE_PROVIDER_SITE_OTHER): Payer: PPO | Admitting: Family Medicine

## 2018-04-01 ENCOUNTER — Encounter: Payer: PPO | Admitting: Family Medicine

## 2018-04-01 VITALS — BP 132/60 | HR 51 | Temp 97.9°F | Resp 16 | Ht 65.0 in | Wt 161.0 lb

## 2018-04-01 DIAGNOSIS — I2510 Atherosclerotic heart disease of native coronary artery without angina pectoris: Secondary | ICD-10-CM

## 2018-04-01 DIAGNOSIS — Z1159 Encounter for screening for other viral diseases: Secondary | ICD-10-CM

## 2018-04-01 DIAGNOSIS — I10 Essential (primary) hypertension: Secondary | ICD-10-CM

## 2018-04-01 DIAGNOSIS — Z0000 Encounter for general adult medical examination without abnormal findings: Secondary | ICD-10-CM

## 2018-04-01 DIAGNOSIS — E7800 Pure hypercholesterolemia, unspecified: Secondary | ICD-10-CM

## 2018-04-01 DIAGNOSIS — Z23 Encounter for immunization: Secondary | ICD-10-CM | POA: Diagnosis not present

## 2018-04-01 DIAGNOSIS — I5023 Acute on chronic systolic (congestive) heart failure: Secondary | ICD-10-CM | POA: Diagnosis not present

## 2018-04-01 MED ORDER — NEOMYCIN-POLYMYXIN-HC 3.5-10000-1 OT SUSP: 4.0000 [drp] | Freq: Four times a day (QID) | OTIC | 0 refills | Status: AC

## 2018-04-01 MED ORDER — ESZOPICLONE 1 MG PO TABS: ORAL_TABLET | ORAL | 2 refills | Status: DC

## 2018-04-01 MED ORDER — ZOSTER VAC RECOMB ADJUVANTED 50 MCG/0.5ML IM SUSR: 0.5000 mL | Freq: Once | INTRAMUSCULAR | 1 refills | Status: AC

## 2018-04-01 NOTE — Patient Instructions (Signed)
Get debrox  Come back to have ear rechecked   Kelli Roy , Thank you for taking time to come for your Medicare Wellness Visit. I appreciate your ongoing commitment to your health goals. Please review the following plan we discussed and let me know if I can assist you in the future.   These are the goals we discussed: Goals   None     This is a list of the screening recommended for you and due dates:  Health Maintenance  Topic Date Due  .  Hepatitis C: One time screening is recommended by Center for Disease Control  (CDC) for  adults born from [birth year] through 1966.   [birth year]  . Flu Shot  02/03/2018  . Mammogram  04/22/2019  . Tetanus Vaccine  02/07/2022  . Colon Cancer Screening  05/18/2026  . DEXA scan (bone density measurement)  Completed  . Pneumonia vaccines  Completed

## 2018-04-01 NOTE — Progress Notes (Signed)
Subjective:   Kelli Roy Staff is a 72 y.o. female who presents for Medicare Annual (Subsequent) preventive examination.  Review of Systems:  Review of Systems  Constitutional: Negative.  Negative for chills, diaphoresis, fever, malaise/fatigue and weight loss.  HENT: Positive for ear pain. Negative for congestion, ear discharge, hearing loss, nosebleeds, sinus pain and tinnitus.   Eyes: Negative.   Respiratory: Negative.   Cardiovascular: Negative.   Gastrointestinal: Negative.  Negative for nausea and vomiting.  Genitourinary: Negative.   Musculoskeletal: Negative.   Skin: Negative.  Negative for rash.  Endo/Heme/Allergies: Negative.   Psychiatric/Behavioral: Negative.   All other systems reviewed and are negative.   She has some ear pain, but otherwise no acute complaints  Cardiac Risk Factors include: Other (see comment)     Objective:     Vitals: BP 132/60   Pulse (!) 51   Temp 97.9 F (36.6 C) (Oral)   Resp 16   Ht 5\' 5"  (1.651 m)   Wt 161 lb (73 kg)   SpO2 97%   BMI 26.79 kg/m   Body mass index is 26.79 kg/m.  Advanced Directives 04/01/2018 03/01/2018 05/18/2016 05/13/2016 12/16/2014 09/07/2014 06/26/2014  Does Patient Have a Medical Advance Directive? Yes Yes Yes Yes No No;Yes Yes  Type of Paramedic of [place];Living will [place];Living will [place];Living will Living will;Healthcare Power of [place] -  Does patient want to make changes to medical advance directive? - - - - - - No - Patient declined  Copy of [place] in Chart? Yes - Yes Yes - No - copy requested -  Would patient like information on creating a medical advance directive? - - - - - No - patient declined information -  Pre-existing out of facility DNR order (yellow form or pink MOST form) - - - - - - -    Tobacco Social History   Tobacco Use  Smoking Status Never Smoker  Smokeless  Tobacco Never Used     Counseling given: Not Answered   Clinical Intake:  Pre-visit preparation completed: No  Pain : No/denies pain     BMI - recorded: 26.8 Nutritional Status: BMI 25 -29 Overweight Nutritional Risks: None Diabetes: No  How often do you need to have someone help you when you read instructions, pamphlets, or other written materials from your doctor or pharmacy?: 1 - Never     Information entered by :: LT  Past Medical History:  Diagnosis Date  . Allergy   . Alopecia   . Arthritis   . CAD (coronary artery disease)    a. 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 Cyndia Bent   . Cataract   . Chronic systolic CHF (congestive heart failure) ([place])    a. 03/2014 Echo: EF 25-30%; Pre op TEE 05/2014 with EF of 50%  . Family history of cardiovascular disease   . Family history of colon cancer   . History of long-term treatment with high-risk medication   . Hyperlipidemia   . Hypertension   . Insomnia   . Ischemic cardiomyopathy    a. 03/2014 Echo: EF 25-30%, mid-apical/anteroseptal and inf AK, Gr 2 DD, ? apical thrombus, Mild  MR.-->Discharged with LifeVest.  . Malaise and fatigue   . Myocardial infarction ([place]) 03/2014  . Shortness of breath dyspnea   . Sinus headache    Past Surgical History:  Procedure Laterality Date  . CARDIAC CATHETERIZATION  2001   showed no blockages  . CATARACT EXTRACTION W/PHACO Left 01/04/2014   Procedure: CATARACT EXTRACTION PHACO AND INTRAOCULAR LENS PLACEMENT (IOC);  Surgeon: Tonny Branch, MD;  Location: [facility];  Service: Ophthalmology;  Laterality: Left;  CDE:5.65  . CATARACT EXTRACTION W/PHACO Right 01/29/2014   Procedure: CATARACT EXTRACTION PHACO AND INTRAOCULAR LENS PLACEMENT RIGHT EYE CDE=4.49;  Surgeon: Tonny Branch, MD;   Location: [facility];  Service: Ophthalmology;  Laterality: Right;  . CERVICAL SPINE SURGERY  09/27/2003   C6-67 servical fusion  . COLONOSCOPY WITH PROPOFOL N/A 05/18/2016   Procedure: COLONOSCOPY WITH PROPOFOL;  Surgeon: Garlan Fair, MD;  Location: [facility];  Service: Endoscopy;  Laterality: N/A;  . CORONARY ARTERY BYPASS GRAFT N/A 05/21/2014   Procedure: CORONARY ARTERY BYPASS GRAFTING (CABG);  Surgeon: Gaye Pollack, MD;  [facility];  Service: Open Heart Surgery;  Laterality: N/A;  . CYSTECTOMY  1998   fibroid cysts  . DILATION AND CURETTAGE OF UTERUS  1976   2nd to miscarriage  . EYE SURGERY    . INTRAOPERATIVE TRANSESOPHAGEAL ECHOCARDIOGRAM N/A 05/21/2014   Procedure: INTRAOPERATIVE TRANSESOPHAGEAL ECHOCARDIOGRAM;  Surgeon: Gaye Pollack, MD;  [facility];  Service: Open Heart Surgery;  Laterality: N/A;  . LEFT HEART CATHETERIZATION WITH CORONARY ANGIOGRAM N/A 03/08/2014   Procedure: LEFT HEART CATHETERIZATION WITH CORONARY ANGIOGRAM;  Surgeon: Peter M Martinique, MD;  Location: [facility] CATH LAB;  Service: Cardiovascular;  Laterality: N/A;  . 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 PERCUTANEOUS CORONARY STENT INTERVENTION (PCI-S)  03/08/2014   Procedure: PERCUTANEOUS CORONARY STENT INTERVENTION (PCI-S);  Surgeon: Peter M Martinique, MD;  Location: [facility] CATH LAB;  Service: Cardiovascular;;  prox lad  bms   Family History  Problem Relation Age of Onset  . Stroke Mother   . Heart attack Mother   . Heart disease Mother   . Peripheral vascular disease Father        had stent in leg  . Colon cancer Father   . Atrial fibrillation Father        with pacemaker  . Thyroid disease Father   . Cancer Father   . Hearing loss Father   . Hypertension Father   . Pancreatic cancer Brother   . Cancer Brother   . Hypertension Brother   . Coronary artery disease Brother        with Stent  . Cancer Brother   . Hypertension Brother   . Coronary artery  disease Brother        with CABG in his 83's  . Hypertension Brother   . Thyroid disease Brother   . Hypertension Brother   . Thyroid disease Sister    Social History   Socioeconomic History  . Marital status: Married    Spouse name: Not on file  . Number of children: Not on file  . Years of education: Not on file  . Highest education level: Not on file  Occupational History  . Not on file  Social Needs  . Financial resource strain: Not on file  . Food insecurity:    Worry: Not on file    Inability: Not on file  . Transportation needs:    Medical: Not  on file    Non-medical: Not on file  Tobacco Use  . Smoking status: Never Smoker  . Smokeless tobacco: Never Used  Substance and Sexual Activity  . Alcohol use: No    Comment: rare glass of wine  . Drug use: No  . Sexual activity: Not Currently    Birth control/protection: Post-menopausal  Lifestyle  . Physical activity:    Days per week: Not on file    Minutes per session: Not on file  . Stress: Not on file  Relationships  . Social connections:    Talks on phone: Not on file    Gets together: Not on file    Attends religious service: Not on file    Active member of club or organization: Not on file    Attends meetings of clubs or organizations: Not on file    Relationship status: Not on file  Other Topics Concern  . Not on file  Social History Narrative  . Not on file    Outpatient Encounter Medications as of 04/01/2018  Medication Sig  . acetaminophen (TYLENOL) 500 MG tablet Take 500 mg by mouth every 6 (six) hours as needed for moderate pain or headache.   Marland Kitchen aspirin EC 81 MG tablet Take 81 mg by mouth at bedtime.   . Cholecalciferol (VITAMIN D3 PO) Take 1 capsule by mouth daily.   . Coenzyme Q10-Vitamin E (QUNOL ULTRA COQ10 PO) Take 200 mg by mouth at bedtime.   . Cyanocobalamin (VITAMIN B12) 1000 MCG TBCR Take 1,000 mcg by mouth daily.  Marland Kitchen docusate sodium (COLACE) 100 MG capsule Take 100-200 mg by mouth 2  (two) times daily. Take 100 mg by mouth in the morning and 200 mg at bedtime  . eszopiclone (LUNESTA) 1 MG TABS tablet TAKE A HALF TABLET AT BEDTIME AS NEEDED  . furosemide (LASIX) 20 MG tablet TAKE 1 TO 2 TABLETS DAILY (Patient taking differently: Take 20 mg by mouth See admin instructions. Take 20 mg by mouth once a day and may take an additional 20 mg as needed for fluid retention)  . loratadine (CLARITIN) 10 MG tablet Take 10 mg by mouth daily as needed for allergies or rhinitis.  Marland Kitchen losartan (COZAAR) 50 MG tablet TAKE 1 TABLET BY MOUTH EVERY DAY  . nitroGLYCERIN (NITROSTAT) 0.4 MG SL tablet PLACE 1 TABLET (0.4 MG TOTAL) UNDER THE TONGUE EVERY 5 (FIVE) MINUTES AS NEEDED FOR CHEST PAIN.  . rosuvastatin (CRESTOR) 10 MG tablet TAKE 1 TABLET (10 MG TOTAL) BY MOUTH DAILY. (Patient taking differently: Take 10 mg by mouth at bedtime. )  . tetrahydrozoline-zinc (VISINE-AC) 0.05-0.25 % ophthalmic solution Place 2 drops into both eyes daily as needed (for dry or irritated eyes).   . [DISCONTINUED] eszopiclone (LUNESTA) 1 MG TABS tablet TAKE A HALF TABLET AT BEDTIME AS NEEDED  . neomycin-polymyxin-hydrocortisone (CORTISPORIN) 3.5-10000-1 OTIC suspension Place 4 drops into the left ear 4 (four) times daily for 5 days.  . [EXPIRED] Zoster Vaccine Adjuvanted [facility]) injection Inject 0.5 mLs into the muscle once for 1 dose. And repeat once more in 2-6 months   No facility-administered encounter medications on file as of 04/01/2018.     Activities of Daily Living In your present state of health, do you have any difficulty performing the following activities: 04/01/2018  Hearing? N  Vision? N  Difficulty concentrating or making decisions? N  Walking or climbing stairs? N  Dressing or bathing? N  Doing errands, shopping? N  Preparing Food and eating ? N  Using the Toilet? N  In the past six months, have you accidently leaked urine? N  Do you have problems with loss of bowel control? N  Managing your  Medications? N  Managing your Finances? N  Housekeeping or managing your Housekeeping? N  Some recent data might be hidden    Patient Care Team: [facility], Modena Nunnery, MD as PCP - General (Family Medicine) Paula Compton, MD as Consulting Physician (Obstetrics and Gynecology)    Dermatology - annual skin exam, Dr. Reola Calkins dermatology Cardiology - Dr. Martinique, [facility],  Assessment:   This is a routine wellness examination for Aarionna.  Exercise Activities and Dietary recommendations Current Exercise Habits: Structured exercise class(cardiac rehab), Type of exercise: treadmill;walking, Time (Minutes): 45, Frequency (Times/Week): 2, Weekly Exercise (Minutes/Week): 90, Intensity: Mild, Exercise limited by: cardiac condition(s)  Goals   None   Goal to continue being active  Fall Risk Fall Risk  09/21/2017 03/24/2017 07/14/2016 01/03/2016 07/04/2015  Falls in the past year? No No No No No   Is the patient's home free of loose throw rugs in walkways, pet beds, electrical cords, etc?   yes      Grab bars in the bathroom? yes      Handrails on the stairs?   yes      Adequate lighting?   yes  Timed Get Up and Go performed: <12  Depression Screen PHQ 2/9 Scores 09/21/2017 03/24/2017 07/14/2016 01/03/2016  PHQ - 2 Score 0 2 0 2  PHQ- 9 Score 3 7 0 7     Cognitive Testing   Alert? Yes  Normal Appearance?Yes  Oriented to person? Yes  Place? Yes  Time? Yes  Recall of three objects? Yes  Can perform simple calculations? Yes  Displays appropriate judgment?Yes  Can read the correct time from a watch face?Yes       6CIT Screen 04/01/2018  What Year? 0 points  What month? 0 points  What time? 0 points  Count back from 20 0 points  Months in reverse 0 points  Repeat phrase 10 points  Total Score 10    Immunization History  Administered Date(s) Administered  . Influenza,inj,Quad PF,6+ Mos 06/06/2014, 07/04/2015, 07/14/2016, 03/24/2017, 04/01/2018  . Pneumococcal Conjugate-13  07/04/2015  . Pneumococcal Polysaccharide-23 07/04/2014  . Td 02/08/2012    Qualifies for Shingles Vaccine?  Not done it, not sure if she wants - sent to pharmacy  Screening Tests Health Maintenance  Topic Date Due  . MAMMOGRAM  04/22/2019  . TETANUS/TDAP  02/07/2022  . COLONOSCOPY  05/18/2026  . INFLUENZA VACCINE  Completed  . DEXA SCAN  Completed  . Hepatitis C Screening  Completed  . PNA vac Low Risk Adult  Completed   Hep C ordered today with other screening and basic labs  Cancer Screenings: Lung: Low Dose CT Chest recommended if Age 79-80 years, 30 pack-year currently smoking OR have quit w/in 15years. Patient does not qualify. Breast:  Up to date on Mammogram? Yes   Up to date of Bone Density/Dexa? Yes Colorectal: UTD      Plan:  72 y/o here for subsequent well visit, poor score on 6CIT screen, but feel the address was difficult to remember, no other concerns for memory or cognition.  She has some mild ear pain, has some wax and mild redness, given Rx for drops to try, advised to use debrox and return if still bothering her.    ICD-10-CM   1. Encounter for Medicare annual wellness exam Z00.00  2. Essential hypertension I10 CBC with Differential/Platelet    COMPLETE METABOLIC PANEL WITH GFR    Lipid panel  3. Pure hypercholesterolemia E78.00 CBC with Differential/Platelet    COMPLETE METABOLIC PANEL WITH GFR    Lipid panel  4. Acute on chronic systolic heart failure (HCC) T77.11 COMPLETE METABOLIC PANEL WITH GFR    Lipid panel  5. Coronary artery disease involving native coronary artery of native heart without angina pectoris A57.90 COMPLETE METABOLIC PANEL WITH GFR    Lipid panel  6. Encounter for hepatitis C screening test for low risk patient Z11.59 Hepatitis C antibody  7. Need for immunization against influenza Z23 Flu Vaccine QUAD 36+ mos IM     I have personally reviewed and noted the following in the patient's chart:   . Medical and social  history . Use of alcohol, tobacco or illicit drugs  . Current medications and supplements . Functional ability and status . Nutritional status . Physical activity . Advanced directives . List of other physicians . Hospitalizations, surgeries, and ER visits in previous 12 months . Vitals . Screenings to include cognitive, depression, and falls . Referrals and appointments  In addition, I have reviewed and discussed with patient certain preventive protocols, quality metrics, and best practice recommendations. A written personalized care plan for preventive services as well as general preventive health recommendations were provided to patient.     Delsa Grana, PA-C  04/04/2018

## 2018-04-03 LAB — CBC WITH DIFFERENTIAL/PLATELET
BASOS ABS: 20 {cells}/uL (ref 0–200)
Basophils Relative: 0.4 %
EOS ABS: 60 {cells}/uL (ref 15–500)
EOS PCT: 1.2 %
HCT: 41.3 % (ref 35.0–45.0)
Hemoglobin: 13.6 g/dL (ref 11.7–15.5)
Lymphs Abs: 1340 cells/uL (ref 850–3900)
MCH: 31 pg (ref 27.0–33.0)
MCHC: 32.9 g/dL (ref 32.0–36.0)
MCV: 94.1 fL (ref 80.0–100.0)
MONOS PCT: 10.6 %
MPV: 11.3 fL (ref 7.5–12.5)
NEUTROS ABS: 3050 {cells}/uL (ref 1500–7800)
Neutrophils Relative %: 61 %
Platelets: 186 10*3/uL (ref 140–400)
RBC: 4.39 10*6/uL (ref 3.80–5.10)
RDW: 12.3 % (ref 11.0–15.0)
TOTAL LYMPHOCYTE: 26.8 %
WBC mixed population: 530 cells/uL (ref 200–950)
WBC: 5 10*3/uL (ref 3.8–10.8)

## 2018-04-03 LAB — LIPID PANEL
CHOLESTEROL: 168 mg/dL (ref <200)
HDL: 55 mg/dL (ref >50)
LDL CHOLESTEROL (CALC): 90 mg/dL
Non-HDL Cholesterol (Calc): 113 mg/dL (calc) (ref <130)
TRIGLYCERIDES: 129 mg/dL (ref <150)
Total CHOL/HDL Ratio: 3.1 (calc) (ref <5.0)

## 2018-04-03 LAB — COMPLETE METABOLIC PANEL WITH GFR
AG Ratio: 1.9 (calc) (ref 1.0–2.5)
ALKALINE PHOSPHATASE (APISO): 103 U/L (ref 33–130)
ALT: 10 U/L (ref 6–29)
AST: 16 U/L (ref 10–35)
Albumin: 4.3 g/dL (ref 3.6–5.1)
BILIRUBIN TOTAL: 0.5 mg/dL (ref 0.2–1.2)
BUN: 15 mg/dL (ref 7–25)
CHLORIDE: 106 mmol/L (ref 98–110)
CO2: 27 mmol/L (ref 20–32)
CREATININE: 0.9 mg/dL (ref 0.60–0.93)
Calcium: 10 mg/dL (ref 8.6–10.4)
GFR, Est African American: 74 mL/min/{1.73_m2} (ref >=60)
GFR, Est Non African American: 63 mL/min/{1.73_m2} (ref >=60)
GLUCOSE: 90 mg/dL (ref 65–99)
Globulin: 2.3 g/dL (calc) (ref 1.9–3.7)
Potassium: 5 mmol/L (ref 3.5–5.3)
Sodium: 142 mmol/L (ref 135–146)
Total Protein: 6.6 g/dL (ref 6.1–8.1)

## 2018-04-03 LAB — HEPATITIS C ANTIBODY
Hepatitis C Ab: NONREACTIVE
SIGNAL TO CUT-OFF: 0.01 (ref <1.00)

## 2018-04-27 ENCOUNTER — Ambulatory Visit: Payer: PPO | Admitting: Family Medicine

## 2018-04-28 NOTE — Progress Notes (Signed)
Cardiology Office Note    Date:  04/29/2018   ID:  Kelli Roy, DOB [birth year], MRN [id number]  PCP:  Alycia Rossetti, MD  Cardiologist:  Dr. Martinique  Chief Complaint  Patient presents with  . Hypertension  . Congestive Heart Failure    History of Present Illness:  Kelli Roy is a 72 y.o. female is seen for evaluation of HTN. She has a PMH of CAD, chronic systolic heart failure, hypertension, hyperlipidemia, and history of ischemic cardiomyopathy.  Patient had anterior STEMI in September 2015 and underwent emergent stenting of the proximal LAD with a BMS.  She also had 50 to 70% left main disease, 70 to 80% ostial LAD disease, 50 to 70% ostial left circumflex disease, EF was 25 to 30%.  She was placed on high-dose Lipitor therapy but noted to have marked elevation of her transaminases and her statin was discontinued.  Since then, she has been tolerating Crestor.  She eventually returned in November 2015 for CABG by Dr. Cyndia Bent was LIMA to LAD, SVG to diagonal, SVG to OM, SVG to RCA.  She has a history of a rash reaction to both Brilinta and Plavix.  Echocardiogram in February 2016 showed EF 35 to 40% with akinesis to the anteroseptum and apex.  She developed acute heart failure in March 2016.  She was diuresed with IV Lasix and it was discharged home with addition of Lasix and Aldactone.  She later developed hypotension requiring the discontinuation of Coreg and Aldactone.  She had a Myoview in January 2017 that showed scar in the anteroseptal, apical and inferolateral segment without ischemia, EF 49%.  She was seen on 12/02/2017, she was having a very atypical chest discomfort.  She sought medical attention in [facility] emergency room after having persistent chest soreness for several hours.  Troponin was negative at the time.  EKG showed no significant changes.  Given the atypical nature of her symptoms no further work up recommended.  When last seen she complained of   severe frontal HA. BP was markedly elevated to 211/115. She went to ED and was given amlodipine with improvement. Labs and Ecg were OK. We altered the timing of her medication and subsequent BP readings have been widely variable but typically controlled. She notes that when her blood pressure is low below 100 she feels very sleepy and can't function. She has no HA, chest pain, SOB.    Past Medical History:  Diagnosis Date  . Allergy   . Alopecia   . Arthritis   . CAD (coronary artery disease)    a. 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 Cyndia Bent   . Cataract   . Chronic systolic CHF (congestive heart failure) ([place])    a. 03/2014 Echo: EF 25-30%; Pre op TEE 05/2014 with EF of 50%  . Family history of cardiovascular disease   . Family history of colon cancer   . History of long-term treatment with high-risk medication   . Hyperlipidemia   . Hypertension   . Insomnia   . Ischemic cardiomyopathy    a. 03/2014 Echo: EF 25-30%, mid-apical/anteroseptal and inf AK, Gr 2 DD, ? apical thrombus, Mild MR.-->Discharged with LifeVest.  . Malaise and fatigue   .  Myocardial infarction ([place]) 03/2014  . Shortness of breath dyspnea   . Sinus headache     Past Surgical History:  Procedure Laterality Date  . CARDIAC CATHETERIZATION  2001   showed no blockages  . CATARACT EXTRACTION W/PHACO Left 01/04/2014   Procedure: CATARACT EXTRACTION PHACO AND INTRAOCULAR LENS PLACEMENT (IOC);  Surgeon: Tonny Branch, MD;  Location: [facility];  Service: Ophthalmology;  Laterality: Left;  CDE:5.65  . CATARACT EXTRACTION W/PHACO Right 01/29/2014   Procedure: CATARACT EXTRACTION PHACO AND INTRAOCULAR LENS PLACEMENT RIGHT EYE CDE=4.49;  Surgeon: Tonny Branch, MD;  Location: [facility];  Service: Ophthalmology;  Laterality:  Right;  . CERVICAL SPINE SURGERY  09/27/2003   C6-67 servical fusion  . COLONOSCOPY WITH PROPOFOL N/A 05/18/2016   Procedure: COLONOSCOPY WITH PROPOFOL;  Surgeon: Garlan Fair, MD;  Location: [facility];  Service: Endoscopy;  Laterality: N/A;  . CORONARY ARTERY BYPASS GRAFT N/A 05/21/2014   Procedure: CORONARY ARTERY BYPASS GRAFTING (CABG);  Surgeon: Gaye Pollack, MD;  Location: [facility];  Service: Open Heart Surgery;  Laterality: N/A;  . CYSTECTOMY  1998   fibroid cysts  . DILATION AND CURETTAGE OF UTERUS  1976   2nd to miscarriage  . EYE SURGERY    . INTRAOPERATIVE TRANSESOPHAGEAL ECHOCARDIOGRAM N/A 05/21/2014   Procedure: INTRAOPERATIVE TRANSESOPHAGEAL ECHOCARDIOGRAM;  Surgeon: Gaye Pollack, MD;  Location: [facility] OR;  Service: Open Heart Surgery;  Laterality: N/A;  . LEFT HEART CATHETERIZATION WITH CORONARY ANGIOGRAM N/A 03/08/2014   Procedure: LEFT HEART CATHETERIZATION WITH CORONARY ANGIOGRAM;  Surgeon: Peter M Martinique, MD;  Location: [facility] CATH LAB;  Service: Cardiovascular;  Laterality: N/A;  . 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 PERCUTANEOUS CORONARY STENT INTERVENTION (PCI-S)  03/08/2014   Procedure: PERCUTANEOUS CORONARY STENT INTERVENTION (PCI-S);  Surgeon: Peter M Martinique, MD;  Location: [facility] CATH LAB;  Service: Cardiovascular;;  prox lad  bms    Current Medications: Outpatient Medications Prior to Visit  Medication Sig Dispense Refill  . acetaminophen (TYLENOL) 500 MG tablet Take 500 mg by mouth every 6 (six) hours as needed for moderate pain or headache.     Marland Kitchen aspirin EC 81 MG tablet Take 81 mg by mouth at bedtime.     . Cholecalciferol (VITAMIN D3 PO) Take 1 capsule by mouth daily.     . Coenzyme Q10-Vitamin E (QUNOL ULTRA COQ10 PO) Take 200 mg by mouth at bedtime.     . Cyanocobalamin (VITAMIN B12) 1000 MCG TBCR Take 1,000 mcg by mouth daily.    Marland Kitchen docusate sodium (COLACE) 100 MG capsule Take 100-200 mg by mouth 2 (two) times daily.  Take 100 mg by mouth in the morning and 200 mg at bedtime    . eszopiclone (LUNESTA) 1 MG TABS tablet TAKE A HALF TABLET AT BEDTIME AS NEEDED 30 tablet 2  . furosemide (LASIX) 20 MG tablet TAKE 1 TO 2 TABLETS DAILY (Patient taking differently: Take 20 mg by mouth See admin instructions. Take 20 mg by mouth once a day and may take an additional 20 mg as needed for fluid retention) 180 tablet 1  . loratadine (CLARITIN) 10 MG tablet Take 10 mg by mouth daily as needed for allergies or rhinitis.    Marland Kitchen losartan (COZAAR) 50 MG tablet TAKE 1 TABLET BY MOUTH EVERY DAY 90 tablet 2  . nitroGLYCERIN (NITROSTAT) 0.4 MG SL tablet PLACE 1 TABLET (0.4 MG TOTAL) UNDER THE TONGUE EVERY 5 (FIVE) MINUTES AS NEEDED FOR  CHEST PAIN. 25 tablet 3  . rosuvastatin (CRESTOR) 10 MG tablet TAKE 1 TABLET (10 MG TOTAL) BY MOUTH DAILY. (Patient taking differently: Take 10 mg by mouth at bedtime. ) 90 tablet 1  . tetrahydrozoline-zinc (VISINE-AC) 0.05-0.25 % ophthalmic solution Place 2 drops into both eyes daily as needed (for dry or irritated eyes).      No facility-administered medications prior to visit.      Allergies:   Carvedilol; Spironolactone; Demerol [meperidine]; Lipitor [atorvastatin]; Lisinopril; Meperidine hcl; Dexilant [dexlansoprazole]; Latex; Neomycin; Plavix [clopidogrel bisulfate]; Sulfonamide derivatives; and Ticagrelor   Social History   Socioeconomic History  . Marital status: Married    Spouse name: Not on file  . Number of children: Not on file  . Years of education: Not on file  . Highest education level: Not on file  Occupational History  . Not on file  Social Needs  . Financial resource strain: Not on file  . Food insecurity:    Worry: Not on file    Inability: Not on file  . Transportation needs:    Medical: Not on file    Non-medical: Not on file  Tobacco Use  . Smoking status: Never Smoker  . Smokeless tobacco: Never Used  Substance and Sexual Activity  . Alcohol use: No    Comment:  rare glass of wine  . Drug use: No  . Sexual activity: Not Currently    Birth control/protection: Post-menopausal  Lifestyle  . Physical activity:    Days per week: Not on file    Minutes per session: Not on file  . Stress: Not on file  Relationships  . Social connections:    Talks on phone: Not on file    Gets together: Not on file    Attends religious service: Not on file    Active member of club or organization: Not on file    Attends meetings of clubs or organizations: Not on file    Relationship status: Not on file  Other Topics Concern  . Not on file  Social History Narrative  . Not on file     Family History:  The patient's family history includes Atrial fibrillation in her father; Cancer in her brother, brother, and father; Colon cancer in her father; Coronary artery disease in her brother and brother; Hearing loss in her father; Heart attack in her mother; Heart disease in her mother; Hypertension in her brother, brother, brother, brother, and father; Pancreatic cancer in her brother; Peripheral vascular disease in her father; Stroke in her mother; Thyroid disease in her brother, father, and sister.   ROS:   Please see the history of present illness.    ROS All other systems reviewed and are negative.   PHYSICAL EXAM:   VS:  BP (!) 142/64   Pulse 60   Ht 5\' 5"  (1.651 m)   Wt 161 lb 3.2 oz (73.1 kg)   SpO2 99%   BMI 26.83 kg/m    GEN: Well nourished, well developed, in no acute distress  HEENT: normal  Neck: no JVD, carotid bruits, or masses Cardiac: RRR; no murmurs, rubs, or gallops,no edema  Respiratory:  clear to auscultation bilaterally, normal work of breathing GI: soft, nontender, nondistended, + BS MS: no deformity or atrophy  Skin: warm and dry, no rash Neuro:  Alert and Oriented x 3, Strength and sensation are intact Psych: euthymic mood, full affect  Wt Readings from Last 3 Encounters:  04/29/18 161 lb 3.2 oz (73.1 kg)  04/01/18  161 lb (73 kg)    03/03/18 161 lb (73 kg)      Studies/Labs Reviewed:   EKG:  EKG is not ordered today.   Recent Labs: 04/01/2018: ALT 10; BUN 15; Creat 0.90; Hemoglobin 13.6; Platelets 186; Potassium 5.0; Sodium 142   Lipid Panel    Component Value Date/Time   CHOL 168 04/01/2018 1048   TRIG 129 04/01/2018 1048   HDL 55 04/01/2018 1048   CHOLHDL 3.1 04/01/2018 1048   VLDL 25 07/14/2016 1211   LDLCALC 90 04/01/2018 1048    Additional studies/ records that were reviewed today include:   Echo 08/30/2014 LV EF: 35% -  40% Study Conclusions  - Left ventricle: Poor acoustical windows with multiple wall motion abnormalities. Recommend limited study with definity contrast agent for more accurate assessment. There is distal septal akinesis. The cavity size was normal. Systolic function was moderately reduced. The estimated ejection fraction was in the range of 35% to 40%. There is akinesis of the apical myocardium. There is akinesis of the apicalanterior myocardium. There is akinesis of the midanteroseptal myocardium. There is akinesis of the apicallateral myocardium. There was a reduced contribution of atrial contraction to ventricular filling, due to increased ventricular diastolic pressure or atrial contractile dysfunction. Doppler parameters are consistent with a reversible restrictive pattern, indicative of decreased left ventricular diastolic compliance and/or increased left atrial pressure (grade 3 diastolic dysfunction). - Aortic valve: Trileaflet; normal thickness, mildly calcified leaflets. - Mitral valve: There was mild regurgitation. - Left atrium: The atrium was mildly dilated. - Pulmonic valve: There was mild regurgitation.   Myoview 07/10/2015 Study Highlights    Nuclear stress EF: 49%.  The left ventricular ejection fraction is mildly decreased (45-54%).  There was no ST segment deviation noted during stress.  Defect 1: There is a defect  present in the mid anteroseptal, mid inferolateral, apical septal, apical lateral and apex location.  Findings consistent with prior myocardial infarction.  This is a low risk study.   1. Low risk study 2. Scar antero septal, apical and inferolateral. 3. Low nl EF     ASSESSMENT:    1. Coronary artery disease involving native coronary artery of native heart without angina pectoris   2. S/P CABG x 4   3. Hyperlipidemia, unspecified hyperlipidemia type   4. Essential hypertension   5. Chronic systolic heart failure (HCC)      PLAN:  In order of problems listed above:  1. HTN. Readings are still quite labile but with switch to taking losartan in the pm and lasix in the am readings are better. Monitor daily BP at home and at Rehab. Will try and avoid low readings since this causes adverse symptoms.  2.   CAD: Last Myoview 2017.  Asymptomatic now.   3.   Chronic systolic heart failure: appears well compensated.   4.   Hyperlipidemia: On Crestor 10 mg daily. Last LDL 90. Notes she has not been compliant with diet. Will try harder.   5.   Ischemic cardiomyopathy: On losartan, unable to tolerate beta-blocker due to baseline bradycardia.   Medication Adjustments/Labs and Tests Ordered: Current medicines are reviewed at length with the patient today.  Concerns regarding medicines are outlined above.  Medication changes, Labs and Tests ordered today are listed in the Patient Instructions below. There are no Patient Instructions on file for this visit.   Signed, Peter Martinique, MD  04/29/2018 11:11 AM    [facility] [place],  [place]  [address] Phone: [phone]; Fax: [phone]

## 2018-04-29 ENCOUNTER — Ambulatory Visit: Payer: PPO | Admitting: Cardiology

## 2018-04-29 ENCOUNTER — Encounter: Payer: Self-pay | Admitting: Cardiology

## 2018-04-29 VITALS — BP 142/64 | HR 60 | Ht 65.0 in | Wt 161.2 lb

## 2018-04-29 DIAGNOSIS — I2510 Atherosclerotic heart disease of native coronary artery without angina pectoris: Secondary | ICD-10-CM | POA: Diagnosis not present

## 2018-04-29 DIAGNOSIS — I10 Essential (primary) hypertension: Secondary | ICD-10-CM | POA: Diagnosis not present

## 2018-04-29 DIAGNOSIS — E785 Hyperlipidemia, unspecified: Secondary | ICD-10-CM | POA: Diagnosis not present

## 2018-04-29 DIAGNOSIS — Z951 Presence of aortocoronary bypass graft: Secondary | ICD-10-CM | POA: Diagnosis not present

## 2018-04-29 DIAGNOSIS — I5022 Chronic systolic (congestive) heart failure: Secondary | ICD-10-CM | POA: Diagnosis not present

## 2018-05-03 ENCOUNTER — Other Ambulatory Visit: Payer: Self-pay | Admitting: Cardiology

## 2018-05-03 MED ORDER — FUROSEMIDE 20 MG PO TABS: ORAL_TABLET | ORAL | 3 refills | Status: DC

## 2018-05-03 MED ORDER — FUROSEMIDE 20 MG PO TABS: 20.0000 mg | ORAL_TABLET | ORAL | 3 refills | Status: DC

## 2018-05-04 ENCOUNTER — Encounter: Payer: Self-pay | Admitting: Family Medicine

## 2018-05-17 DIAGNOSIS — Z1231 Encounter for screening mammogram for malignant neoplasm of breast: Secondary | ICD-10-CM | POA: Diagnosis not present

## 2018-05-17 LAB — HM MAMMOGRAPHY

## 2018-05-21 ENCOUNTER — Encounter: Payer: Self-pay | Admitting: *Deleted

## 2018-06-06 ENCOUNTER — Ambulatory Visit (INDEPENDENT_AMBULATORY_CARE_PROVIDER_SITE_OTHER): Payer: PPO | Admitting: Otolaryngology

## 2018-06-06 DIAGNOSIS — J310 Chronic rhinitis: Secondary | ICD-10-CM | POA: Diagnosis not present

## 2018-06-06 DIAGNOSIS — H95122 Granulation of postmastoidectomy cavity, left ear: Secondary | ICD-10-CM | POA: Diagnosis not present

## 2018-06-20 ENCOUNTER — Other Ambulatory Visit: Payer: Self-pay | Admitting: Cardiology

## 2018-06-21 DIAGNOSIS — L309 Dermatitis, unspecified: Secondary | ICD-10-CM | POA: Diagnosis not present

## 2018-06-21 DIAGNOSIS — Z23 Encounter for immunization: Secondary | ICD-10-CM | POA: Diagnosis not present

## 2018-06-21 DIAGNOSIS — L820 Inflamed seborrheic keratosis: Secondary | ICD-10-CM | POA: Diagnosis not present

## 2018-06-30 DIAGNOSIS — Z1389 Encounter for screening for other disorder: Secondary | ICD-10-CM | POA: Diagnosis not present

## 2018-06-30 DIAGNOSIS — Z124 Encounter for screening for malignant neoplasm of cervix: Secondary | ICD-10-CM | POA: Diagnosis not present

## 2018-06-30 DIAGNOSIS — Z01419 Encounter for gynecological examination (general) (routine) without abnormal findings: Secondary | ICD-10-CM | POA: Diagnosis not present

## 2018-06-30 DIAGNOSIS — Z6826 Body mass index (BMI) 26.0-26.9, adult: Secondary | ICD-10-CM | POA: Diagnosis not present

## 2018-06-30 LAB — HM PAP SMEAR

## 2018-07-04 ENCOUNTER — Other Ambulatory Visit: Payer: Self-pay | Admitting: Cardiology

## 2018-07-12 ENCOUNTER — Encounter: Payer: Self-pay | Admitting: *Deleted

## 2018-08-08 ENCOUNTER — Other Ambulatory Visit: Payer: Self-pay

## 2018-08-08 ENCOUNTER — Encounter: Payer: Self-pay | Admitting: Family Medicine

## 2018-08-08 ENCOUNTER — Ambulatory Visit (INDEPENDENT_AMBULATORY_CARE_PROVIDER_SITE_OTHER): Payer: PPO | Admitting: Family Medicine

## 2018-08-08 VITALS — BP 140/72 | HR 86 | Temp 98.7°F | Resp 16 | Ht 65.0 in | Wt 158.0 lb

## 2018-08-08 DIAGNOSIS — J069 Acute upper respiratory infection, unspecified: Secondary | ICD-10-CM

## 2018-08-08 DIAGNOSIS — I5022 Chronic systolic (congestive) heart failure: Secondary | ICD-10-CM

## 2018-08-08 MED ORDER — BENZONATATE 100 MG PO CAPS: 100.0000 mg | ORAL_CAPSULE | Freq: Three times a day (TID) | ORAL | 0 refills | Status: DC | PRN

## 2018-08-08 NOTE — Assessment & Plan Note (Signed)
I do not see any specific signs of fluid overload on today's exam.  Her blood pressure is about her typical when she comes into the office.  Her weight is actually down from her last visit.  We will have her take another 40 mg tomorrow of the fluid pill and then resume her normal 20 mg once a day.  She has not had any chest pain do not hear any wheezing

## 2018-08-08 NOTE — Progress Notes (Signed)
   Subjective:    Patient ID: Kelli Roy, female    DOB: [birth year], 73 y.o.   MRN: [id number]  Patient presents for Chest Congestion (x1 week- nonproductive cough, wheezing in throat, has taken Lasix 60mg )  Last Wed started with cough with congestion in chest, but non productive . +Sick contact at cardiac rehab. Started with tickle in her throat. She started mucinex in past few days., No fever. Felt better yesterday, but still has sorness in her ribs Last night started with sinus pressure and drainage in left side of face.  But then states that she always has little postnasal drip which she uses nasal spray for it is not any worse than usual. She was worried about CHF and fluid buildip as she felt similar syptoms in face before. ALso noticed a wheezing sound when she would lay back. She took lasix 60mg  today, typically takes 20mg  once a day   Weight up 2 lbs from her dry weight at home   No fever, no vomiting, had diarrhea x 2 last Thursday   Review Of Systems:  GEN- denies fatigue, fever, weight loss,weakness, recent illness HEENT- denies eye drainage, change in vision, nasal discharge, CVS- denies chest pain, palpitations RESP- denies SOB, +cough, wheeze ABD- denies N/V, change in stools, abd pain GU- denies dysuria, hematuria, dribbling, incontinence MSK- denies joint pain, muscle aches, injury Neuro- denies headache, dizziness, syncope, seizure activity       Objective:    BP 140/72   Pulse 86   Temp 98.7 F (37.1 C) (Temporal)   Resp 16   Ht 5\' 5"  (1.651 m)   Wt 158 lb (71.7 kg)   SpO2 97%   BMI 26.29 kg/m  GEN- NAD, alert and oriented x3,well appearing  HEENT- PERRL, EOMI, non injected sclera, pink conjunctiva, MMM, oropharynx clear, nares clear rhinorrhea no maxillary sinus tenderness, TMs clear bilaterally canals clear Neck- Supple, no thyromegaly , no JVD, no lymphadenopathy CVS- RRR, no murmur RESP-CTAB ABD-NABS,soft,NT,ND EXT- No edema Pulses-  Radial,2+        Assessment & Plan:      Problem List Items Addressed This Visit      Unprioritized   Chronic systolic CHF (congestive heart failure) ([place])    I do not see any specific signs of fluid overload on today's exam.  Her blood pressure is about her typical when she comes into the office.  Her weight is actually down from her last visit.  We will have her take another 40 mg tomorrow of the fluid pill and then resume her normal 20 mg once a day.  She has not had any chest pain do not hear any wheezing       Other Visit Diagnoses    Viral URI    -  Primary   Viral illness which I think she is actually over the hump.  She has sick contacts last week.  She is afebrile she looks healthy and well today.  She can continue will spray as well as her Mucinex have also given Tessalon Perles for cough      Note: This dictation was prepared with Dragon dictation along with smaller phrase technology. Any transcriptional errors that result from this process are unintentional.

## 2018-08-08 NOTE — Patient Instructions (Signed)
Take 40mg  of lasix tomorrow- Tuesday, then resume your regular  Use cough pill  Continue mucinex  1 tablet twice a day  F/U as needed

## 2018-09-13 ENCOUNTER — Telehealth: Payer: Self-pay

## 2018-09-13 ENCOUNTER — Other Ambulatory Visit: Payer: Self-pay

## 2018-09-13 DIAGNOSIS — E785 Hyperlipidemia, unspecified: Secondary | ICD-10-CM

## 2018-09-13 DIAGNOSIS — I2510 Atherosclerotic heart disease of native coronary artery without angina pectoris: Secondary | ICD-10-CM

## 2018-09-13 DIAGNOSIS — I10 Essential (primary) hypertension: Secondary | ICD-10-CM

## 2018-09-13 NOTE — Telephone Encounter (Signed)
Spoke to patient Dr.Jordan advised to having fasting labs with a cbc about 1 week before May appointment.Advised I will mail lab orders.

## 2018-10-26 ENCOUNTER — Telehealth: Payer: Self-pay

## 2018-10-26 NOTE — Telephone Encounter (Signed)
Left message on voicemail for patient to call office to schedule virtual visit due to covid 19.

## 2018-10-28 ENCOUNTER — Telehealth: Payer: Self-pay | Admitting: Cardiology

## 2018-10-28 NOTE — Telephone Encounter (Signed)
LVM to schedule recall. °

## 2018-10-28 NOTE — Telephone Encounter (Signed)
New Message ° ° ° °Pt is returning call  ° ° ° °Please call back  °

## 2018-11-07 ENCOUNTER — Other Ambulatory Visit: Payer: Self-pay | Admitting: *Deleted

## 2018-11-07 MED ORDER — ESZOPICLONE 1 MG PO TABS: ORAL_TABLET | ORAL | 2 refills | Status: DC

## 2018-11-07 NOTE — Telephone Encounter (Signed)
Received call from patient.   Requested refill on Lunesta.   Ok to refill??  Last office visit 08/08/2018.  Last refill 04/01/2018, #2 refills.

## 2018-11-08 ENCOUNTER — Ambulatory Visit: Payer: PPO | Admitting: Cardiology

## 2018-12-05 ENCOUNTER — Ambulatory Visit (INDEPENDENT_AMBULATORY_CARE_PROVIDER_SITE_OTHER): Payer: PPO | Admitting: Otolaryngology

## 2018-12-05 ENCOUNTER — Other Ambulatory Visit: Payer: Self-pay

## 2018-12-05 DIAGNOSIS — J3489 Other specified disorders of nose and nasal sinuses: Secondary | ICD-10-CM

## 2018-12-05 DIAGNOSIS — H95122 Granulation of postmastoidectomy cavity, left ear: Secondary | ICD-10-CM

## 2018-12-14 ENCOUNTER — Other Ambulatory Visit: Payer: Self-pay

## 2018-12-14 MED ORDER — ROSUVASTATIN CALCIUM 10 MG PO TABS: 10.0000 mg | ORAL_TABLET | Freq: Every day | ORAL | 1 refills | Status: DC

## 2019-03-07 DIAGNOSIS — I2510 Atherosclerotic heart disease of native coronary artery without angina pectoris: Secondary | ICD-10-CM | POA: Diagnosis not present

## 2019-03-07 DIAGNOSIS — I10 Essential (primary) hypertension: Secondary | ICD-10-CM | POA: Diagnosis not present

## 2019-03-07 DIAGNOSIS — E785 Hyperlipidemia, unspecified: Secondary | ICD-10-CM | POA: Diagnosis not present

## 2019-03-08 LAB — BASIC METABOLIC PANEL
BUN/Creatinine Ratio: 16 (ref 12–28)
BUN: 13 mg/dL (ref 8–27)
CO2: 25 mmol/L (ref 20–29)
Calcium: 9.7 mg/dL (ref 8.7–10.3)
Chloride: 107 mmol/L — ABNORMAL HIGH (ref 96–106)
Creatinine, Ser: 0.82 mg/dL (ref 0.57–1.00)
GFR calc Af Amer: 82 mL/min/{1.73_m2} (ref >59)
GFR calc non Af Amer: 71 mL/min/{1.73_m2} (ref >59)
Glucose: 90 mg/dL (ref 65–99)
Potassium: 4.2 mmol/L (ref 3.5–5.2)
Sodium: 144 mmol/L (ref 134–144)

## 2019-03-08 LAB — CBC WITH DIFFERENTIAL/PLATELET
Basophils Absolute: 0 10*3/uL (ref 0.0–0.2)
Basos: 0 %
EOS (ABSOLUTE): 0 10*3/uL (ref 0.0–0.4)
Eos: 0 %
Hematocrit: 39.5 % (ref 34.0–46.6)
Hemoglobin: 13.3 g/dL (ref 11.1–15.9)
Immature Grans (Abs): 0 10*3/uL (ref 0.0–0.1)
Immature Granulocytes: 0 %
Lymphocytes Absolute: 1.5 10*3/uL (ref 0.7–3.1)
Lymphs: 32 %
MCH: 31.1 pg (ref 26.6–33.0)
MCHC: 33.7 g/dL (ref 31.5–35.7)
MCV: 92 fL (ref 79–97)
Monocytes Absolute: 0.5 10*3/uL (ref 0.1–0.9)
Monocytes: 10 %
Neutrophils Absolute: 2.7 10*3/uL (ref 1.4–7.0)
Neutrophils: 58 %
Platelets: 177 10*3/uL (ref 150–450)
RBC: 4.28 x10E6/uL (ref 3.77–5.28)
RDW: 12.3 % (ref 11.7–15.4)
WBC: 4.6 10*3/uL (ref 3.4–10.8)

## 2019-03-08 LAB — LIPID PANEL
Chol/HDL Ratio: 2.8 ratio (ref 0.0–4.4)
Cholesterol, Total: 176 mg/dL (ref 100–199)
HDL: 64 mg/dL (ref >39)
LDL Chol Calc (NIH): 90 mg/dL (ref 0–99)
Triglycerides: 129 mg/dL (ref 0–149)
VLDL Cholesterol Cal: 22 mg/dL (ref 5–40)

## 2019-03-08 LAB — HEPATIC FUNCTION PANEL
ALT: 11 IU/L (ref 0–32)
AST: 14 IU/L (ref 0–40)
Albumin: 4.4 g/dL (ref 3.7–4.7)
Alkaline Phosphatase: 109 IU/L (ref 39–117)
Bilirubin Total: 0.5 mg/dL (ref 0.0–1.2)
Bilirubin, Direct: 0.13 mg/dL (ref 0.00–0.40)
Total Protein: 6.3 g/dL (ref 6.0–8.5)

## 2019-03-09 ENCOUNTER — Other Ambulatory Visit: Payer: Self-pay

## 2019-03-09 DIAGNOSIS — E785 Hyperlipidemia, unspecified: Secondary | ICD-10-CM

## 2019-03-09 DIAGNOSIS — E7800 Pure hypercholesterolemia, unspecified: Secondary | ICD-10-CM

## 2019-03-09 DIAGNOSIS — I2510 Atherosclerotic heart disease of native coronary artery without angina pectoris: Secondary | ICD-10-CM

## 2019-03-09 MED ORDER — FUROSEMIDE 20 MG PO TABS: ORAL_TABLET | ORAL | 0 refills | Status: DC

## 2019-03-09 MED ORDER — ROSUVASTATIN CALCIUM 20 MG PO TABS: 20.0000 mg | ORAL_TABLET | Freq: Every day | ORAL | 3 refills | Status: DC

## 2019-03-10 NOTE — Progress Notes (Signed)
Cardiology Office Note    Date:  03/16/2019   ID:  Kelli Roy, DOB 1946-02-12, MRN [id number]  PCP:  Alycia Rossetti, MD  Cardiologist:  Dr. Martinique  Chief Complaint  Patient presents with  . Other    Follow up appointment. Patient c/o chest tightness when bring active. Meds reviewed verbally with patient.   . Hypertension    History of Present Illness:  Kelli Roy is a 72 y.o. female is seen for follow up.  She has a PMH of CAD, chronic systolic heart failure, hypertension, hyperlipidemia, and history of ischemic cardiomyopathy.  Patient had anterior STEMI in September 2015 and underwent emergent stenting of the proximal LAD with a BMS.  She also had 50 to 70% left main disease, 70 to 80% ostial LAD disease, 50 to 70% ostial left circumflex disease, EF was 25 to 30%.  She was placed on high-dose Lipitor therapy but noted to have marked elevation of her transaminases and her statin was discontinued.  Since then, she has been tolerating Crestor.  She eventually returned in November 2015 for CABG by Dr. Cyndia Bent was LIMA to LAD, SVG to diagonal, SVG to OM, SVG to RCA.  She has a history of a rash reaction to both Brilinta and Plavix.  Echocardiogram in February 2016 showed EF 35 to 40% with akinesis to the anteroseptum and apex.  She developed acute heart failure in March 2016.  She was diuresed with IV Lasix and it was discharged home with addition of Lasix and Aldactone.  She later developed hypotension requiring the discontinuation of Coreg and Aldactone.  She had a Myoview in January 2017 that showed scar in the anteroseptal, apical and inferolateral segment without ischemia, EF 49%.  She was seen on 12/02/2017, she was having a very atypical chest discomfort.  She sought medical attention in [facility] emergency room after having persistent chest soreness for several hours.  Troponin was negative at the time.  EKG showed no significant changes.  Given the atypical nature of  her symptoms no further work up recommended.  On follow up today she reports she notes if she is really busy at the end of the day she will experience chest tightness. This will go away after 10 minutes rest. This has been unchanged for past year. BP has generally been doing quite well except seems to go up when she is traveling.    Past Medical History:  Diagnosis Date  . Allergy   . Alopecia   . Arthritis   . CAD (coronary artery disease)    a. 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 Cyndia Bent   . Cataract   . Chronic systolic CHF (congestive heart failure) ([place])    a. 03/2014 Echo: EF 25-30%; Pre op TEE 05/2014 with EF of 50%  . Family history of cardiovascular disease   . Family history of colon cancer   . History of long-term treatment with high-risk medication   . Hyperlipidemia   . Hypertension   . Insomnia   . Ischemic cardiomyopathy    a. 03/2014 Echo: EF 25-30%, mid-apical/anteroseptal and inf AK, Gr 2 DD, ? apical thrombus, Mild MR.-->Discharged with LifeVest.  . Malaise and fatigue   . Myocardial infarction (  [place]) 03/2014  . Shortness of breath dyspnea   . Sinus headache     Past Surgical History:  Procedure Laterality Date  . CARDIAC CATHETERIZATION  2001   showed no blockages  . CATARACT EXTRACTION W/PHACO Left 01/04/2014   Procedure: CATARACT EXTRACTION PHACO AND INTRAOCULAR LENS PLACEMENT (IOC);  Surgeon: Tonny Branch, MD;  Location: [facility];  Service: Ophthalmology;  Laterality: Left;  CDE:5.65  . CATARACT EXTRACTION W/PHACO Right 01/29/2014   Procedure: CATARACT EXTRACTION PHACO AND INTRAOCULAR LENS PLACEMENT RIGHT EYE CDE=4.49;  Surgeon: Tonny Branch, MD;  Location: [facility];  Service: Ophthalmology;  Laterality: Right;  . CERVICAL SPINE SURGERY  09/27/2003    C6-67 servical fusion  . COLONOSCOPY WITH PROPOFOL N/A 05/18/2016   Procedure: COLONOSCOPY WITH PROPOFOL;  Surgeon: Garlan Fair, MD;  Location: [facility];  Service: Endoscopy;  Laterality: N/A;  . CORONARY ARTERY BYPASS GRAFT N/A 05/21/2014   Procedure: CORONARY ARTERY BYPASS GRAFTING (CABG);  Surgeon: Gaye Pollack, MD;  Location: [facility];  Service: Open Heart Surgery;  Laterality: N/A;  . CYSTECTOMY  1998   fibroid cysts  . DILATION AND CURETTAGE OF UTERUS  1976   2nd to miscarriage  . EYE SURGERY    . INTRAOPERATIVE TRANSESOPHAGEAL ECHOCARDIOGRAM N/A 05/21/2014   Procedure: INTRAOPERATIVE TRANSESOPHAGEAL ECHOCARDIOGRAM;  Surgeon: Gaye Pollack, MD;  Location: [facility] OR;  Service: Open Heart Surgery;  Laterality: N/A;  . LEFT HEART CATHETERIZATION WITH CORONARY ANGIOGRAM N/A 03/08/2014   Procedure: LEFT HEART CATHETERIZATION WITH CORONARY ANGIOGRAM;  Surgeon: Sama Arauz M [place], MD;  Location: [facility] CATH LAB;  Service: Cardiovascular;  Laterality: N/A;  . 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 PERCUTANEOUS CORONARY STENT INTERVENTION (PCI-S)  03/08/2014   Procedure: PERCUTANEOUS CORONARY STENT INTERVENTION (PCI-S);  Surgeon: Jazlyn Tippens M Martinique, MD;  Location: [facility] CATH LAB;  Service: Cardiovascular;;  prox lad  bms    Current Medications: Outpatient Medications Prior to Visit  Medication Sig Dispense Refill  . acetaminophen (TYLENOL) 500 MG tablet Take 500 mg by mouth every 6 (six) hours as needed for moderate pain or headache.     Marland Kitchen aspirin EC 81 MG tablet Take 81 mg by mouth at bedtime.     . Cholecalciferol (VITAMIN D3 PO) Take 1 capsule by mouth daily.     . Coenzyme Q10-Vitamin E (QUNOL ULTRA COQ10 PO) Take 200 mg by mouth at bedtime.     . Cyanocobalamin (VITAMIN B12) 1000 MCG TBCR Take 1,000 mcg by mouth daily.    Marland Kitchen docusate sodium (COLACE) 100 MG capsule Take 100-200 mg by mouth 2 (two) times daily. Take 100 mg by mouth in the morning and 200 mg at  bedtime    . eszopiclone (LUNESTA) 1 MG TABS tablet TAKE A HALF TABLET AT BEDTIME AS NEEDED 30 tablet 2  . furosemide (LASIX) 20 MG tablet may take an additional 20 mg as needed 180 tablet 0  . ipratropium (ATROVENT) 0.06 % nasal spray Place 2 sprays into both nostrils 4 (four) times daily.    Marland Kitchen loratadine (CLARITIN) 10 MG tablet Take 10 mg by mouth daily as needed for allergies or rhinitis.    Marland Kitchen losartan (COZAAR) 25 MG tablet TAKE 2 TABLETS BY MOUTH EVERY DAY 180 tablet 3  . rosuvastatin (CRESTOR) 20 MG tablet Take 1 tablet (20 mg total) by mouth daily. 90 tablet 3  . tetrahydrozoline-zinc (VISINE-AC) 0.05-0.25 % ophthalmic solution Place 2 drops into both eyes daily as  needed (for dry or irritated eyes).     . nitroGLYCERIN (NITROSTAT) 0.4 MG SL tablet PLACE 1 TABLET (0.4 MG TOTAL) UNDER THE TONGUE EVERY 5 (FIVE) MINUTES AS NEEDED FOR CHEST PAIN. 25 tablet 3  . benzonatate (TESSALON) 100 MG capsule Take 1 capsule (100 mg total) by mouth 3 (three) times daily as needed for cough. (Patient not taking: Reported on 03/16/2019) 20 capsule 0  . losartan (COZAAR) 50 MG tablet TAKE 1 TABLET BY MOUTH EVERY DAY (Patient not taking: Reported on 03/16/2019) 90 tablet 2   No facility-administered medications prior to visit.      Allergies:   Carvedilol, Spironolactone, Demerol [meperidine], Lipitor [atorvastatin], Lisinopril, Meperidine hcl, Dexilant [dexlansoprazole], Latex, Neomycin, Plavix [clopidogrel bisulfate], Sulfonamide derivatives, and Ticagrelor   Social History   Socioeconomic History  . Marital status: Married    Spouse name: Not on file  . Number of children: Not on file  . Years of education: Not on file  . Highest education level: Not on file  Occupational History  . Not on file  Social Needs  . Financial resource strain: Not on file  . Food insecurity    Worry: Not on file    Inability: Not on file  . Transportation needs    Medical: Not on file    Non-medical: Not on file   Tobacco Use  . Smoking status: Never Smoker  . Smokeless tobacco: Never Used  Substance and Sexual Activity  . Alcohol use: No    Comment: rare glass of wine  . Drug use: No  . Sexual activity: Not Currently    Birth control/protection: Post-menopausal  Lifestyle  . Physical activity    Days per week: Not on file    Minutes per session: Not on file  . Stress: Not on file  Relationships  . Social Herbalist on phone: Not on file    Gets together: Not on file    Attends religious service: Not on file    Active member of club or organization: Not on file    Attends meetings of clubs or organizations: Not on file    Relationship status: Not on file  Other Topics Concern  . Not on file  Social History Narrative  . Not on file     Family History:  The patient's family history includes Atrial fibrillation in her father; Cancer in her brother, brother, and father; Colon cancer in her father; Coronary artery disease in her brother and brother; Hearing loss in her father; Heart attack in her mother; Heart disease in her mother; Hypertension in her brother, brother, brother, brother, and father; Pancreatic cancer in her brother; Peripheral vascular disease in her father; Stroke in her mother; Thyroid disease in her brother, father, and sister.   ROS:   Please see the history of present illness.    ROS All other systems reviewed and are negative.   PHYSICAL EXAM:   VS:  BP (!) 120/50 (BP Location: Left Arm, Patient Position: Sitting, Cuff Size: Normal)   Pulse (!) 57   Ht 5\' 5"  (1.651 m)   Wt 155 lb (70.3 kg)   BMI 25.79 kg/m    GEN: Well nourished, well developed, in no acute distress  HEENT: normal  Neck: no JVD, carotid bruits, or masses Cardiac: RRR; no murmurs, rubs, or gallops,no edema  Respiratory:  clear to auscultation bilaterally, normal work of breathing GI: soft, nontender, nondistended, + BS MS: no deformity or atrophy  Skin: warm  and dry, no rash Neuro:   Alert and Oriented x 3, Strength and sensation are intact Psych: euthymic mood, full affect  Wt Readings from Last 3 Encounters:  03/16/19 155 lb (70.3 kg)  08/08/18 158 lb (71.7 kg)  04/29/18 161 lb 3.2 oz (73.1 kg)      Studies/Labs Reviewed:   EKG:  EKG is  ordered today. NSR rate 57. Old septal infarct. I have personally reviewed and interpreted this study.   Recent Labs: 03/07/2019: ALT 11; BUN 13; Creatinine, Ser 0.82; Hemoglobin 13.3; Platelets 177; Potassium 4.2; Sodium 144   Lipid Panel    Component Value Date/Time   CHOL 176 03/07/2019 0843   TRIG 129 03/07/2019 0843   HDL 64 03/07/2019 0843   CHOLHDL 2.8 03/07/2019 0843   CHOLHDL 3.1 04/01/2018 1048   VLDL 25 07/14/2016 1211   LDLCALC 90 04/01/2018 1048    Additional studies/ records that were reviewed today include:   Echo 08/30/2014 LV EF: 35% -  40% Study Conclusions  - Left ventricle: Poor acoustical windows with multiple wall motion abnormalities. Recommend limited study with definity contrast agent for more accurate assessment. There is distal septal akinesis. The cavity size was normal. Systolic function was moderately reduced. The estimated ejection fraction was in the range of 35% to 40%. There is akinesis of the apical myocardium. There is akinesis of the apicalanterior myocardium. There is akinesis of the midanteroseptal myocardium. There is akinesis of the apicallateral myocardium. There was a reduced contribution of atrial contraction to ventricular filling, due to increased ventricular diastolic pressure or atrial contractile dysfunction. Doppler parameters are consistent with a reversible restrictive pattern, indicative of decreased left ventricular diastolic compliance and/or increased left atrial pressure (grade 3 diastolic dysfunction). - Aortic valve: Trileaflet; normal thickness, mildly calcified leaflets. - Mitral valve: There was mild regurgitation. -  Left atrium: The atrium was mildly dilated. - Pulmonic valve: There was mild regurgitation.   Myoview 07/10/2015 Study Highlights    Nuclear stress EF: 49%.  The left ventricular ejection fraction is mildly decreased (45-54%).  There was no ST segment deviation noted during stress.  Defect 1: There is a defect present in the mid anteroseptal, mid inferolateral, apical septal, apical lateral and apex location.  Findings consistent with prior myocardial infarction.  This is a low risk study.   1. Low risk study 2. Scar antero septal, apical and inferolateral. 3. Low nl EF     ASSESSMENT:    1. Coronary artery disease involving native coronary artery of native heart without angina pectoris   2. Essential hypertension   3. Pure hypercholesterolemia   4. S/P CABG x 4   5. Chronic systolic heart failure (HCC)      PLAN:  In order of problems listed above:  1. HTN. Readings are generally well controlled on losartan 50 mg daily. Some elevation when traveling ? Related to stress. Will monitor. May take extra losartan if BP > 160.    2.   CAD: Last Myoview 2017. She has stable class 1 angina.  3.   Chronic systolic heart failure: appears well compensated.   4.   Hyperlipidemia: LDL 90. Will increase Crestor to 20 mg and repeat  Fasting lab in 3 months.  5.   Ischemic cardiomyopathy: On losartan, unable to tolerate beta-blocker due to baseline bradycardia.   Medication Adjustments/Labs and Tests Ordered: Current medicines are reviewed at length with the patient today.  Concerns regarding medicines are outlined above.  Medication changes, Labs and  Tests ordered today are listed in the Patient Instructions below. There are no Patient Instructions on file for this visit.   Signed, Mikeyla Music Martin[place], MD  03/16/2019 2:13 PM    [facility] [address], [place], [place]  [address] Phone: [phone]; Fax: [phone]

## 2019-03-16 ENCOUNTER — Ambulatory Visit: Payer: PPO | Admitting: Cardiology

## 2019-03-16 ENCOUNTER — Encounter: Payer: Self-pay | Admitting: Cardiology

## 2019-03-16 ENCOUNTER — Other Ambulatory Visit: Payer: Self-pay

## 2019-03-16 VITALS — BP 120/50 | HR 57 | Ht 65.0 in | Wt 155.0 lb

## 2019-03-16 DIAGNOSIS — I5022 Chronic systolic (congestive) heart failure: Secondary | ICD-10-CM | POA: Diagnosis not present

## 2019-03-16 DIAGNOSIS — Z951 Presence of aortocoronary bypass graft: Secondary | ICD-10-CM | POA: Diagnosis not present

## 2019-03-16 DIAGNOSIS — I2510 Atherosclerotic heart disease of native coronary artery without angina pectoris: Secondary | ICD-10-CM

## 2019-03-16 DIAGNOSIS — I10 Essential (primary) hypertension: Secondary | ICD-10-CM | POA: Diagnosis not present

## 2019-03-16 DIAGNOSIS — E7800 Pure hypercholesterolemia, unspecified: Secondary | ICD-10-CM

## 2019-03-16 MED ORDER — NITROGLYCERIN 0.4 MG SL SUBL: 0.4000 mg | SUBLINGUAL_TABLET | SUBLINGUAL | 3 refills | Status: DC | PRN

## 2019-03-16 MED ORDER — LOSARTAN POTASSIUM 50 MG PO TABS: 50.0000 mg | ORAL_TABLET | Freq: Every day | ORAL | 3 refills | Status: DC

## 2019-03-16 NOTE — Patient Instructions (Signed)
Medication Instructions:  Increase Crestor to 20 mg daily   Lab work: Fasting labs bmet,lipid and hepatic panels in 3 months    Lab order enclosed   Testing/Procedures: None ordered  Follow-Up: At [facility], you and your health needs are our priority.  As part of our continuing mission to provide you with exceptional heart care, we have created designated Provider Care Teams.  These Care Teams include your primary Cardiologist (physician) and Advanced Practice Providers (APPs -  Physician Assistants and Nurse Practitioners) who all work together to provide you with the care you need, when you need it. . Schedule follow up appointment in 6 months   Call in Dec to schedule March appointment

## 2019-03-24 ENCOUNTER — Other Ambulatory Visit: Payer: Self-pay

## 2019-03-27 ENCOUNTER — Encounter: Payer: Self-pay | Admitting: Family Medicine

## 2019-03-27 ENCOUNTER — Ambulatory Visit (INDEPENDENT_AMBULATORY_CARE_PROVIDER_SITE_OTHER): Payer: PPO | Admitting: Family Medicine

## 2019-03-27 ENCOUNTER — Other Ambulatory Visit: Payer: Self-pay

## 2019-03-27 VITALS — BP 136/68 | HR 60 | Temp 97.9°F | Resp 14 | Ht 65.0 in | Wt 155.0 lb

## 2019-03-27 DIAGNOSIS — Z0000 Encounter for general adult medical examination without abnormal findings: Secondary | ICD-10-CM

## 2019-03-27 DIAGNOSIS — I2510 Atherosclerotic heart disease of native coronary artery without angina pectoris: Secondary | ICD-10-CM | POA: Diagnosis not present

## 2019-03-27 DIAGNOSIS — Z1239 Encounter for other screening for malignant neoplasm of breast: Secondary | ICD-10-CM | POA: Diagnosis not present

## 2019-03-27 DIAGNOSIS — Z0001 Encounter for general adult medical examination with abnormal findings: Secondary | ICD-10-CM | POA: Diagnosis not present

## 2019-03-27 DIAGNOSIS — F5101 Primary insomnia: Secondary | ICD-10-CM

## 2019-03-27 DIAGNOSIS — Z23 Encounter for immunization: Secondary | ICD-10-CM

## 2019-03-27 DIAGNOSIS — M8589 Other specified disorders of bone density and structure, multiple sites: Secondary | ICD-10-CM

## 2019-03-27 NOTE — Progress Notes (Signed)
Subjective:   Patient presents for Medicare Annual/Subsequent preventive examination.   Patient presents for Follow-up (is not fasting- had labs on 9/1 for cards)  Pt here to f/u chronic medical problems.     Pt seen by cardiology Crestor increased to 20mg  due to LDL goal of  70  She has noticed feeling a little light headed past few weeks, she thought it was due to the cholesterol medication   Hypertension- Systolic  [id number] , but then goes up at times to  150's Taking lasix 20mg  once a day  Chronic insomnia- taking lunesta takes on occasion   She has chronic joint pain, in shoulders, knees    Using aspercreme and tylenol  ostepenia- due for bone density    Flu shot  Due  Review Past Medical/Family/Social: Per EMR    Risk Factors  Current exercise habits: little walking - planning to restart cardiac rehab Dietary issues discussed: Yes  Cardiac risk factors: CAD/CHF  Depression Screen  (Note: if answer to either of the following is "Yes", a more complete depression screening is indicated)  Over the past two weeks, have you felt down, depressed or hopeless? No Over the past two weeks, have you felt little interest or pleasure in doing things? No Have you lost interest or pleasure in daily life? No Do you often feel hopeless? No Do you cry easily over simple problems? No   Activities of Daily Living  In your present state of health, do you have any difficulty performing the following activities?:  Driving? No  Managing money? No  Feeding yourself? No  Getting from bed to chair? No  Climbing a flight of stairs? yes  Preparing food and eating?: No  Bathing or showering? No  Getting dressed: No  Getting to the toilet? No  Using the toilet:No  Moving around from place to place: No  In the past year have you fallen or had a near fall?:No  Are you sexually active? No  Do you have more than one partner? No   Hearing Difficulties: No  Do you often ask people to  speak up or repeat themselves? No  Do you experience ringing or noises in your ears? No Do you have difficulty understanding soft or whispered voices? No  Do you feel that you have a problem with memory? No Do you often misplace items? No  Do you feel safe at home? Yes  Cognitive Testing  Alert? Yes Normal Appearance?Yes  Oriented to person? Yes Place? Yes  Time? Yes  Recall of three objects? Yes  Can perform simple calculations? Yes  Displays appropriate judgment?Yes  Can read the correct time from a watch face?Yes   List the Names of Other Physician/Practitioners you currently use:  Cardiology   Screening Tests / Date Colonoscopy up-to-date                   Zostavax due Mammogram up-to-date Influenza Vaccine due Tetanus/tdap up-to-date  Review Of S ystems:  GEN- denies fatigue, fever, weight loss,weakness, recent illness HEENT- denies eye drainage, change in vision, nasal discharge, CVS- denies chest pain, palpitations RESP- denies SOB, cough, wheeze ABD- denies N/V, change in stools, abd pain GU- denies dysuria, hematuria, dribbling, incontinence MSK- + joint pain, muscle aches, injury Neuro- denies headache, dizziness, syncope, seizure activity  BP 136/68   Pulse 60   Temp 97.9 F (36.6 C) (Oral)   Resp 14   Ht 5\' 5"  (1.651 m)   Wt 155 lb (70.3  kg)   SpO2 96%   BMI 25.79 kg/m  GEN- NAD, alert and oriented x3 HEENT- PERRL, EOMI, non injected sclera, pink conjunctiva, MMM, oropharynx clear, TM clear bilat no effusion  Neck- Supple, no thyromegaly CVS- RRR, no murmur RESP-CTAB ABD-NABS,soft,NT,ND EXT- No edema Pulses- Radial, DP- 2+     Assessment:    Annual wellness medicare exam   Plan:    During the course of the visit the patient was educated and counseled about appropriate screening and preventive services including:  Screening mammography - pt to schedule    Osteopenia-continue  calcium and vitamin D patient to schedule her bone  density  CAD/hypertension-reviewed recent labs as well as cardiology visit her Crestor is been increased.  Blood pressure was normal initially was on the low end.  Advised her to keep checking at home if her blood pressure is staying in the low [id number] systolic so recommend decreasing her losartan 25 mg  Fall/screen/depression screen negative  Given information on shingles vaccine she will decide if she would like to proceed with this vaccination.  Shot was given today.  Chronic insomnia- prn lunesta  He has osteoarthritis chronic joint pain.  Advised Tylenol 500 mg would be the safest suspect with increasing her Crestor.  She can also use topical such as Aspercreme, Voltaren gel Patient Instructions (the written plan) was given to the patient.  Medicare Attestation  I have personally reviewed:  The patient's medical and social history  Their use of alcohol, tobacco or illicit drugs  Their current medications and supplements  The patient's functional ability including ADLs,fall risks, home safety risks, cognitive, and hearing and visual impairment  Diet and physical activities  Evidence for depression or mood disorders  The patient's weight, height, BMI, and visual acuity have been recorded in the chart. I have made referrals, counseling, and provided education to the patient based on review of the above and I have provided the patient with a written personalized care plan for preventive services.      Problem List Items Addressed This Visit      Unprioritized   CAD (coronary artery disease), native coronary artery   Insomnia - Primary   Osteopenia    Other Visit Diagnoses    Need for immunization against influenza       Relevant Orders   Flu Vaccine QUAD High Dose(Fluad) (Completed)      Note: This dictation was prepared with Dragon dictation along with smaller phrase technology. Any transcriptional errors that result from this process are unintentional.

## 2019-03-27 NOTE — Patient Instructions (Addendum)
Schedule your Bone Density  Look at shingles vaccine information  Tylenol 500mg  twice a day  Use voltaren gel  F/U 1 year physical

## 2019-05-05 ENCOUNTER — Telehealth: Payer: Self-pay | Admitting: Cardiology

## 2019-05-05 NOTE — Telephone Encounter (Signed)
Pt stated she is having active chest pressure that comes and goes. It started at 7:30am. She says it is radiating to back and has been burping. She says the pressure makes her SOB. States she is shaky and nauseas and felt like she was going to have diarrhea. With pt symptoms and hx, advised to go to ER or call 911. Pt wanted to come to office, educated that if she comes to the office, we would just send her to ER because there is nothing we can do for chest pain here. Pt stated it could be indigestion because she is burping a lot. Advised to go to ER again because there is no way of telling unless she goes to hospital. Verbalized understanding.

## 2019-05-05 NOTE — Telephone Encounter (Signed)
  Patient is calling because she is having some chest discomfort and it goes into her back. She states she is burping and has indigestion and feels weak. She said it comes and goes and seems to come in waves. She says she also has some nausea but has not had vomitted. Her BP is 155/71. She has occasional lightheadedness and feels shaky. She states this started this morning about 7:30 am. Patient has hx of heart attack

## 2019-05-06 ENCOUNTER — Encounter (HOSPITAL_COMMUNITY): Payer: Self-pay | Admitting: *Deleted

## 2019-05-06 ENCOUNTER — Emergency Department (HOSPITAL_COMMUNITY)
Admission: EM | Admit: 2019-05-06 | Discharge: 2019-05-06 | Disposition: A | Discharge status: Home / Self Care | Payer: PPO | Attending: Emergency Medicine | Admitting: Emergency Medicine

## 2019-05-06 ENCOUNTER — Emergency Department (HOSPITAL_COMMUNITY): Payer: PPO

## 2019-05-06 ENCOUNTER — Other Ambulatory Visit: Payer: Self-pay

## 2019-05-06 DIAGNOSIS — Z79899 Other long term (current) drug therapy: Secondary | ICD-10-CM | POA: Diagnosis not present

## 2019-05-06 DIAGNOSIS — Z951 Presence of aortocoronary bypass graft: Secondary | ICD-10-CM | POA: Diagnosis not present

## 2019-05-06 DIAGNOSIS — I110 Hypertensive heart disease with heart failure: Secondary | ICD-10-CM | POA: Insufficient documentation

## 2019-05-06 DIAGNOSIS — R110 Nausea: Secondary | ICD-10-CM | POA: Diagnosis not present

## 2019-05-06 DIAGNOSIS — I509 Heart failure, unspecified: Secondary | ICD-10-CM | POA: Diagnosis not present

## 2019-05-06 DIAGNOSIS — Z91040 Latex allergy status: Secondary | ICD-10-CM | POA: Diagnosis not present

## 2019-05-06 DIAGNOSIS — I2510 Atherosclerotic heart disease of native coronary artery without angina pectoris: Secondary | ICD-10-CM | POA: Diagnosis not present

## 2019-05-06 DIAGNOSIS — R0789 Other chest pain: Secondary | ICD-10-CM | POA: Diagnosis not present

## 2019-05-06 DIAGNOSIS — Z7982 Long term (current) use of aspirin: Secondary | ICD-10-CM | POA: Diagnosis not present

## 2019-05-06 DIAGNOSIS — R0689 Other abnormalities of breathing: Secondary | ICD-10-CM | POA: Diagnosis not present

## 2019-05-06 DIAGNOSIS — I10 Essential (primary) hypertension: Secondary | ICD-10-CM | POA: Diagnosis not present

## 2019-05-06 DIAGNOSIS — R079 Chest pain, unspecified: Secondary | ICD-10-CM | POA: Insufficient documentation

## 2019-05-06 LAB — CBC WITH DIFFERENTIAL/PLATELET
Abs Immature Granulocytes: 0.01 10*3/uL (ref 0.00–0.07)
Basophils Absolute: 0 10*3/uL (ref 0.0–0.1)
Basophils Relative: 0 %
Eosinophils Absolute: 0 10*3/uL (ref 0.0–0.5)
Eosinophils Relative: 0 %
HCT: 44.3 % (ref 36.0–46.0)
Hemoglobin: 14.4 g/dL (ref 12.0–15.0)
Immature Granulocytes: 0 %
Lymphocytes Relative: 13 %
Lymphs Abs: 1.1 10*3/uL (ref 0.7–4.0)
MCH: 31.1 pg (ref 26.0–34.0)
MCHC: 32.5 g/dL (ref 30.0–36.0)
MCV: 95.7 fL (ref 80.0–100.0)
Monocytes Absolute: 0.4 10*3/uL (ref 0.1–1.0)
Monocytes Relative: 5 %
Neutro Abs: 6.6 10*3/uL (ref 1.7–7.7)
Neutrophils Relative %: 82 %
Platelets: 184 10*3/uL (ref 150–400)
RBC: 4.63 MIL/uL (ref 3.87–5.11)
RDW: 12.5 % (ref 11.5–15.5)
WBC: 8 10*3/uL (ref 4.0–10.5)
nRBC: 0 % (ref 0.0–0.2)

## 2019-05-06 LAB — COMPREHENSIVE METABOLIC PANEL
ALT: 14 U/L (ref 0–44)
AST: 19 U/L (ref 15–41)
Albumin: 4.1 g/dL (ref 3.5–5.0)
Alkaline Phosphatase: 91 U/L (ref 38–126)
Anion gap: 7 (ref 5–15)
BUN: 15 mg/dL (ref 8–23)
CO2: 26 mmol/L (ref 22–32)
Calcium: 10 mg/dL (ref 8.9–10.3)
Chloride: 107 mmol/L (ref 98–111)
Creatinine, Ser: 0.79 mg/dL (ref 0.44–1.00)
GFR calc Af Amer: >60 mL/min (ref >60)
GFR calc non Af Amer: >60 mL/min (ref >60)
Glucose, Bld: 120 mg/dL — ABNORMAL HIGH (ref 70–99)
Potassium: 3.9 mmol/L (ref 3.5–5.1)
Sodium: 140 mmol/L (ref 135–145)
Total Bilirubin: 0.7 mg/dL (ref 0.3–1.2)
Total Protein: 7.2 g/dL (ref 6.5–8.1)

## 2019-05-06 LAB — TROPONIN I (HIGH SENSITIVITY)
Troponin I (High Sensitivity): 6 ng/L (ref <18)
Troponin I (High Sensitivity): 7 ng/L (ref <18)

## 2019-05-06 NOTE — ED Provider Notes (Addendum)
[facility] EMERGENCY DEPARTMENT Provider Note   CSN: [id number] Arrival date & time: 05/06/19  [id number]     History   Chief Complaint Chief Complaint  Patient presents with  . Chest Pain    HPI Kelli Roy is a 73 y.o. female.     Chief complaint chest pain described as tightness starting yesterday morning with associated nausea.  No dyspnea or diaphoresis.  Status post CABG 5 years ago.  Past medical history includes hypertension, hyperlipidemia, CHF, CAD, many others.  Severity is mild to moderate.  Patient has taken nothing for the pain.  Nothing makes symptoms better or worse.     Past Medical History:  Diagnosis Date  . Allergy   . Alopecia   . Arthritis   . CAD (coronary artery disease)    a. 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 Cyndia Bent   . Cataract   . Chronic systolic CHF (congestive heart failure) ([place])    a. 03/2014 Echo: EF 25-30%; Pre op TEE 05/2014 with EF of 50%  . Family history of cardiovascular disease   . Family history of colon cancer   . History of long-term treatment with high-risk medication   . Hyperlipidemia   . Hypertension   . Insomnia   . Ischemic cardiomyopathy    a. 03/2014 Echo: EF 25-30%, mid-apical/anteroseptal and inf AK, Gr 2 DD, ? apical thrombus, Mild MR.-->Discharged with LifeVest.  . Malaise and fatigue   . Myocardial infarction ([place]) 03/2014  . Shortness of breath dyspnea   . Sinus headache     Patient Active Problem List   Diagnosis Date Noted  . Osteopenia 04/29/2017  . Allergic rhinitis 03/24/2017  . Bradycardia 03/24/2017  . Constipation 07/05/2015  . Hemorrhoids 07/04/2015  . Chronic systolic CHF (congestive heart failure) ([place]) 07/26/2014  . 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 Hyperlipidemia 03/12/2014  . CAD  (coronary artery disease), native coronary artery 03/12/2014  . Cardiomyopathy, ischemic 03/12/2014  . Acute on chronic combined systolic (EF 123456) and diastolic heart failure (NYHA 2) 03/09/2014  . ST elevation myocardial infarction (STEMI) involving left anterior descending (LAD) coronary artery with complication ([place]) [id number]  . DYSPNEA 07/03/2009  . Essential hypertension 06/19/2009  . Insomnia 06/19/2009    Past Surgical History:  Procedure Laterality Date  . CARDIAC CATHETERIZATION  2001   showed no blockages  . CATARACT EXTRACTION W/PHACO Left 01/04/2014   Procedure: CATARACT EXTRACTION PHACO AND INTRAOCULAR LENS PLACEMENT (IOC);  Surgeon: Tonny Branch, MD;  Location: [facility];  Service: Ophthalmology;  Laterality: Left;  CDE:5.65  . CATARACT EXTRACTION W/PHACO Right 01/29/2014   Procedure: CATARACT EXTRACTION PHACO AND INTRAOCULAR LENS PLACEMENT RIGHT EYE CDE=4.49;  Surgeon: Tonny Branch, MD;  Location: [facility];  Service: Ophthalmology;  Laterality: Right;  . CERVICAL SPINE SURGERY  09/27/2003   C6-67 servical fusion  . COLONOSCOPY WITH PROPOFOL N/A 05/18/2016   Procedure: COLONOSCOPY WITH PROPOFOL;  Surgeon: Garlan Fair, MD;  [facility]: [facility];  Service: Endoscopy;  Laterality: N/A;  . CORONARY ARTERY BYPASS GRAFT N/A 05/21/2014   Procedure: CORONARY ARTERY BYPASS GRAFTING (CABG);  Surgeon: Gaye Pollack, MD;  Location: [place];  Service: Open Heart Surgery;  Laterality: N/A;  . CYSTECTOMY  1998  fibroid cysts  . DILATION AND CURETTAGE OF UTERUS  1976   2nd to miscarriage  . EYE SURGERY    . INTRAOPERATIVE TRANSESOPHAGEAL ECHOCARDIOGRAM N/A 05/21/2014   Procedure: INTRAOPERATIVE TRANSESOPHAGEAL ECHOCARDIOGRAM;  Surgeon: Gaye Pollack, MD;  Location: [facility] OR;  Service: Open Heart Surgery;  Laterality: N/A;  . LEFT HEART CATHETERIZATION WITH CORONARY ANGIOGRAM N/A 03/08/2014   Procedure: LEFT HEART CATHETERIZATION WITH CORONARY ANGIOGRAM;  Surgeon: Peter M Martinique, MD;   Location: [facility] CATH LAB;  Service: Cardiovascular;  Laterality: N/A;  . 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 PERCUTANEOUS CORONARY STENT INTERVENTION (PCI-S)  03/08/2014   Procedure: PERCUTANEOUS CORONARY STENT INTERVENTION (PCI-S);  Surgeon: Peter M Martinique, MD;  Location: [facility] CATH LAB;  Service: Cardiovascular;;  prox lad  bms     OB History   No obstetric history on file.      Home Medications    Prior to Admission medications   Medication Sig Start Date End Date Taking? Authorizing Provider  acetaminophen (TYLENOL) 500 MG tablet Take 500 mg by mouth every 6 (six) hours as needed for moderate pain or headache.     [provider]  aspirin EC 81 MG tablet Take 81 mg by mouth at bedtime.     [provider]  Cholecalciferol (VITAMIN D3 PO) Take 1 capsule by mouth daily.     [provider]  Coenzyme Q10-Vitamin E (QUNOL ULTRA COQ10 PO) Take 200 mg by mouth at bedtime.     [provider]  Cyanocobalamin (VITAMIN B12) 1000 MCG TBCR Take 1,000 mcg by mouth daily.    [provider]  docusate sodium (COLACE) 100 MG capsule Take 100-200 mg by mouth 2 (two) times daily. Take 100 mg by mouth in the morning and 200 mg at bedtime    [provider]  eszopiclone (LUNESTA) 1 MG TABS tablet TAKE A HALF TABLET AT BEDTIME AS NEEDED 11/07/18   Alycia Rossetti, MD  furosemide (LASIX) 20 MG tablet may take an additional 20 mg as needed Patient taking differently: 20 mg daily. may take an additional 20 mg 03/09/19   Martinique, Peter M, MD  ipratropium (ATROVENT) 0.06 % nasal spray Place 2 sprays into both nostrils 4 (four) times daily.    [provider]  loratadine (CLARITIN) 10 MG tablet Take 10 mg by mouth daily as needed for allergies or rhinitis.    [provider]  losartan (COZAAR) 50 MG tablet Take 1 tablet (50 mg total) by mouth daily. 03/16/19   Martinique, Peter M, MD  nitroGLYCERIN (NITROSTAT)  0.4 MG SL tablet Place 1 tablet (0.4 mg total) under the tongue every 5 (five) minutes as needed for chest pain. 03/16/19   Martinique, Peter M, MD  rosuvastatin (CRESTOR) 20 MG tablet Take 1 tablet (20 mg total) by mouth daily. 03/09/19 06/07/19  Martinique, Peter M, MD  tetrahydrozoline-zinc (VISINE-AC) 0.05-0.25 % ophthalmic solution Place 2 drops into both eyes daily as needed (for dry or irritated eyes).     [provider]    Family History Family History  Problem Relation Age of Onset  . Stroke Mother   . Heart attack Mother   . Heart disease Mother   . Peripheral vascular disease Father        had stent in leg  . Colon cancer Father   . Atrial fibrillation Father        with pacemaker  . Thyroid  disease Father   . Cancer Father   . Hearing loss Father   . Hypertension Father   . Pancreatic cancer Brother   . Cancer Brother   . Hypertension Brother   . Coronary artery disease Brother        with Stent  . Cancer Brother   . Hypertension Brother   . Coronary artery disease Brother        with CABG in his 77's  . Hypertension Brother   . Thyroid disease Brother   . Hypertension Brother   . Thyroid disease Sister     Social History Social History   Tobacco Use  . Smoking status: Never Smoker  . Smokeless tobacco: Never Used  Substance Use Topics  . Alcohol use: Not Currently    Comment: rare glass of wine  . Drug use: No     Allergies   Carvedilol, Spironolactone, Demerol [meperidine], Lipitor [atorvastatin], Lisinopril, Dexilant [dexlansoprazole], Latex, Neomycin, Plavix [clopidogrel bisulfate], Sulfonamide derivatives, and Ticagrelor   Review of Systems Review of Systems  All other systems reviewed and are negative.    Physical Exam Updated Vital Signs BP 135/67   Pulse (!) 47   Temp 98 F (36.7 C) (Oral)   Resp 19   Ht 5\' 5"  (1.651 m)   Wt 68.5 kg   SpO2 97%   BMI 25.13 kg/m   Physical Exam Vitals signs and nursing note reviewed.   Constitutional:      Appearance: She is well-developed.  HENT:     Head: Normocephalic and atraumatic.  Eyes:     Conjunctiva/sclera: Conjunctivae normal.  Neck:     Musculoskeletal: Neck supple.  Cardiovascular:     Rate and Rhythm: Normal rate and regular rhythm.  Pulmonary:     Effort: Pulmonary effort is normal.     Breath sounds: Normal breath sounds.  Abdominal:     General: Bowel sounds are normal.     Palpations: Abdomen is soft.  Musculoskeletal: Normal range of motion.  Skin:    General: Skin is warm and dry.  Neurological:     General: No focal deficit present.     Mental Status: She is alert and oriented to person, place, and time.  Psychiatric:        Behavior: Behavior normal.      ED Treatments / Results  Labs (all labs ordered are listed, but only abnormal results are displayed) Labs Reviewed  COMPREHENSIVE METABOLIC PANEL - Abnormal; Notable for the following components:      Result Value   Glucose, Bld 120 (*)    All other components within normal limits  CBC WITH DIFFERENTIAL/PLATELET  TROPONIN I (HIGH SENSITIVITY)  TROPONIN I (HIGH SENSITIVITY)    EKG EKG Interpretation  Date/Time:  Saturday May 06 2019 10:07:31 EDT Ventricular Rate:  55 PR Interval:    QRS Duration: 93 QT Interval:  392 QTC Calculation: 375 R Axis:   54 Text Interpretation: Sinus rhythm Anteroseptal infarct, age indeterminate Confirmed by Nat Christen [phone]) on 05/06/2019 10:35:12 AM   Radiology Dg Chest Port 1 View  Result Date: 05/06/2019 CLINICAL DATA:  Intermittent chest pain since yesterday. EXAM: PORTABLE CHEST 1 VIEW COMPARISON:  Chest x-ray dated 12/16/2014. FINDINGS: Stable mild cardiomegaly. Median sternotomy wires appear intact and stable in alignment, with presumed changes of CABG. Lungs are clear. No pleural effusion or pneumothorax is seen. No acute or suspicious osseous finding. IMPRESSION: 1. No active cardiopulmonary disease. No evidence of pneumonia  or pulmonary edema.  2. Stable mild cardiomegaly. Electronically Signed   By: Franki Cabot M.D.   On: 05/06/2019 10:44    Procedures Procedures (including critical care time)  Medications Ordered in ED Medications - No data to display   Initial Impression / Assessment and Plan / ED Course  I have reviewed the triage vital signs and the nursing notes.  Pertinent labs & imaging results that were available during my care of the patient were reviewed by me and considered in my medical decision making (see chart for details).        Patient is hemodynamically stable.  Will initiate typical cardiac work-up.  1500: Recheck.  Patient comfortable.  Negative troponin x2.  Discussed with cardiologist on-call Dr. Lovena Le.  Patient will follow up with her primary cardiologist this week or return if worse. Final Clinical Impressions(s) / ED Diagnoses   Final diagnoses:  Chest pain, unspecified type    ED Discharge Orders    None       Nat Christen, MD 05/06/19 1027    Nat Christen, MD 05/06/19 1525

## 2019-05-06 NOTE — ED Notes (Signed)
Pt received discharge instructions, expressed understanding and follow up

## 2019-05-06 NOTE — ED Triage Notes (Signed)
Pt c/o intermittent mid chest pain since yesterday morning. Hx of MI. Pt reports nausea and weakness yesterday morning when chest pain initially started, none now.  BP 171/83, HR 77, CBG 122, O2 sat 98% for EMS.

## 2019-05-06 NOTE — Discharge Instructions (Addendum)
Test showed no life-threatening condition.  I discussed your care with the cardiologist on-call.  Recommend calling Dr. Martinique on Monday morning for a follow-up next week.  Return if worse.

## 2019-05-10 NOTE — Progress Notes (Signed)
Virtual Visit via Telephone Note   This visit type was conducted due to national recommendations for restrictions regarding the COVID-19 Pandemic (e.g. social distancing) in an effort to limit this patient's exposure and mitigate transmission in our community.  Due to her co-morbid illnesses, this patient is at least at moderate risk for complications without adequate follow up.  This format is felt to be most appropriate for this patient at this time.  The patient did not have access to video technology/had technical difficulties with video requiring transitioning to audio format only (telephone).  All issues noted in this document were discussed and addressed.  No physical exam could be performed with this format.  Please refer to the patient's chart for her  consent to telehealth for [facility].   Date:  05/12/2019   ID:  Kelli Roy, DOB Oct 20, 1945, MRN [id number]  Patient Location: Home Provider Location: Home  PCP:  Alycia Rossetti, MD  Cardiologist:  Bobbi Kozakiewicz Martinique MD Electrophysiologist:  None   Evaluation Performed:  Follow-Up Visit  Chief Complaint:  CAD  History of Present Illness:    Kelli Roy is a 73 y.o. female with a PMH of CAD,chronic systolic heart failure, hypertension, hyperlipidemia, and history of ischemiccardiomyopathy. Patient had anterior STEMI in September 2015 and underwent emergent stenting of the proximal LAD with a BMS. She also had 50 to 70% left main disease, 70 to 80% ostial LAD disease, 50 to 70% ostial left circumflex disease, EF was 25 to 30%. She was placed on high-dose Lipitor therapy but noted to have marked elevation of her transaminases and her statin was discontinued. Since then, she has been tolerating Crestor. She eventually returned in November 2015 for CABG by Dr. Theodoro Clock LIMA to LAD, SVG to diagonal, SVG to OM, SVG to RCA. She has a history of a rash reaction to both Brilinta and Plavix. Echocardiogram in February 2016 showed  EF 35 to 40% with akinesis to the anteroseptum and apex. She developed acute heart failure in March 2016. She was diuresed with IV Lasix and it was discharged home with addition of Lasix and Aldactone. She later developed hypotension requiring the discontinuation of Coreg and Aldactone. She had a Myoview in January 2017 that showed scar in the anteroseptal, apical and inferolateral segment without ischemia, EF 49%.  She was seen on 12/02/2017, she was having a very atypical chest discomfort.  She sought medical attention in [facility] emergency room after having persistent chest soreness for several hours.  Troponin was negative at the time.  EKG showed no significant changes.  Given the atypical nature of her symptoms no further work up recommended.  She was seen in the ED on 05/06/19 with chest pain and tightness. Ecg showed no acute change and HS troponin negative x 2. She states she was standing at the sink and got really weak- didn't feel well. She was nauseated. She had some tightening in her chest. Still felt poorly the next day and went to the ED. Gradually she has felt better this week. No more chest pressure. Only SOB when carries laundry up stairs. Her LDL was 90 in September. crestor dose was increased. Plan repeat lab in Dec.   The patient does not have symptoms concerning for COVID-19 infection (fever, chills, cough, or new shortness of breath).    Past Medical History:  Diagnosis Date   Allergy    Alopecia    Arthritis    CAD (coronary artery disease)  a. 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 Cyndia Bent    Cataract    Chronic systolic CHF (congestive heart failure) ([place])    a. 03/2014 Echo: EF 25-30%; Pre op TEE 05/2014 with EF of 50%   Family  history of cardiovascular disease    Family history of colon cancer    History of long-term treatment with high-risk medication    Hyperlipidemia    Hypertension    Insomnia    Ischemic cardiomyopathy    a. 03/2014 Echo: EF 25-30%, mid-apical/anteroseptal and inf AK, Gr 2 DD, ? apical thrombus, Mild MR.-->Discharged with LifeVest.   Malaise and fatigue    Myocardial infarction ([place]) 03/2014   Shortness of breath dyspnea    Sinus headache    Past Surgical History:  Procedure Laterality Date   CARDIAC CATHETERIZATION  2001   showed no blockages   CATARACT EXTRACTION W/PHACO Left 01/04/2014   Procedure: CATARACT EXTRACTION PHACO AND INTRAOCULAR LENS PLACEMENT ([place]);  Surgeon: Tonny Branch, MD;  Location: [facility];  Service: Ophthalmology;  Laterality: Left;  CDE:5.65   CATARACT EXTRACTION W/PHACO Right 01/29/2014   Procedure: CATARACT EXTRACTION PHACO AND INTRAOCULAR LENS PLACEMENT RIGHT EYE CDE=4.49;  Surgeon: Tonny Branch, MD;  [facility];  Service: Ophthalmology;  Laterality: Right;   CERVICAL SPINE SURGERY  09/27/2003   C6-67 servical fusion   COLONOSCOPY WITH PROPOFOL N/A 05/18/2016   Procedure: COLONOSCOPY WITH PROPOFOL;  Surgeon: Garlan Fair, MD;  [facility];  Service: Endoscopy;  Laterality: N/A;   CORONARY ARTERY BYPASS GRAFT N/A 05/21/2014   Procedure: CORONARY ARTERY BYPASS GRAFTING (CABG);  Surgeon: Gaye Pollack, MD;  Location: [place];  Service: Open Heart Surgery;  Laterality: N/A;   CYSTECTOMY  1998   fibroid cysts   DILATION AND CURETTAGE OF UTERUS  1976   2nd to miscarriage   EYE SURGERY     INTRAOPERATIVE TRANSESOPHAGEAL ECHOCARDIOGRAM N/A 05/21/2014   Procedure: INTRAOPERATIVE TRANSESOPHAGEAL ECHOCARDIOGRAM;  Surgeon: Gaye Pollack, MD;  Location: [facility] OR;  Service: Open Heart Surgery;  Laterality: N/A;   LEFT HEART CATHETERIZATION WITH CORONARY ANGIOGRAM N/A 03/08/2014   Procedure: LEFT HEART CATHETERIZATION WITH CORONARY ANGIOGRAM;   Surgeon: Loma [place] M Martinique, MD;  Location: [facility] CATH LAB;  Service: Cardiovascular;  Laterality: N/A;   MASTOIDECTOMY  1990   and eardrum repair (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    aspirin EC 81 MG tablet Take 81 mg by mouth at bedtime.    Cholecalciferol (VITAMIN D3 PO) Take 1 capsule by mouth daily.    Coenzyme Q10-Vitamin E (QUNOL ULTRA COQ10 PO) Take 200 mg by mouth at bedtime.    Cyanocobalamin (VITAMIN B12) 1000 MCG TBCR Take 1,000 mcg by mouth daily.   docusate sodium (COLACE) 100 MG capsule Take 100-200 mg by mouth 2 (two) times daily. Take 100 mg by mouth in  the morning and 200 mg at bedtime   eszopiclone (LUNESTA) 1 MG TABS tablet TAKE A HALF TABLET AT BEDTIME AS NEEDED   furosemide (LASIX) 20 MG tablet may take an additional 20 mg as needed (Patient taking differently: 20 mg daily. may take an additional 20 mg)   ipratropium (ATROVENT) 0.06 % nasal spray Place 2 sprays into both nostrils 4 (four) times daily.   loratadine (CLARITIN) 10 MG tablet Take 10 mg by mouth daily as needed for allergies or rhinitis.   losartan (COZAAR) 50 MG tablet Take 1 tablet (50 mg total) by mouth daily.   Magnesium Cl-Calcium Carbonate (SLOW-MAG PO) Take by mouth daily.    nitroGLYCERIN (NITROSTAT) 0.4 MG SL tablet Place 1 tablet (0.4 mg total) under the tongue every 5 (five) minutes as needed for chest pain.   rosuvastatin (CRESTOR) 20 MG tablet Take 1 tablet (20 mg total) by mouth daily.   tetrahydrozoline-zinc (VISINE-AC) 0.05-0.25 % ophthalmic solution Place 2 drops into both eyes daily as needed (for dry or  irritated eyes).      Allergies:   Carvedilol, Spironolactone, Demerol [meperidine], Lipitor [atorvastatin], Lisinopril, Dexilant [dexlansoprazole], Latex, Neomycin, Plavix [clopidogrel bisulfate], Sulfonamide derivatives, and Ticagrelor   Social History   Tobacco Use   Smoking status: Never Smoker   Smokeless tobacco: Never Used  Substance Use Topics   Alcohol use: Not Currently    Comment: rare glass of wine   Drug use: No     Family Hx: The patient's family history includes Atrial fibrillation in her father; Cancer in her brother, brother, and father; Colon cancer in her father; Coronary artery disease in her brother and brother; Hearing loss in her father; Heart attack in her mother; Heart disease in her mother; Hypertension in her brother, brother, brother, brother, and father; Pancreatic cancer in her brother; Peripheral vascular disease in her father; Stroke in her mother; Thyroid disease in her brother, father, and sister.  ROS:   Please see the history of present illness.    All other systems reviewed and are negative.   Prior CV studies:   The following studies were reviewed today:  Echo 08/30/2014 LV EF: 35% -  40% Study Conclusions  - Left ventricle: Poor acoustical windows with multiple wall motion abnormalities. Recommend limited study with definity contrast agent for more accurate assessment. There is distal septal akinesis. The cavity size was normal. Systolic function was moderately reduced. The estimated ejection fraction was in the range of 35% to 40%. There is akinesis of the apical myocardium. There is akinesis of the apicalanterior myocardium. There is akinesis of the midanteroseptal myocardium. There is akinesis of the apicallateral myocardium. There was a reduced contribution of atrial contraction to ventricular filling, due to increased ventricular diastolic pressure or atrial contractile dysfunction. Doppler parameters are  consistent with a reversible restrictive pattern, indicative of decreased left ventricular diastolic compliance and/or increased left atrial pressure (grade 3 diastolic dysfunction). - Aortic valve: Trileaflet; normal thickness, mildly calcified leaflets. - Mitral valve: There was mild regurgitation. - Left atrium: The atrium was mildly dilated. - Pulmonic valve: There was mild regurgitation.   Myoview 07/10/2015 Study Highlights    Nuclear stress EF: 49%.  The left ventricular ejection fraction is mildly decreased (45-54%).  There was no ST segment deviation noted during stress.  Defect 1: There is a defect present in the mid anteroseptal, mid inferolateral, apical septal, apical lateral and apex location.  Findings consistent with prior myocardial infarction.  This is a low risk  study.  1. Low risk study 2. Scar antero septal, apical and inferolateral. 3. Low nl EF     Labs/Other Tests and Data Reviewed:    EKG:  No ECG reviewed.  Recent Labs: 05/06/2019: ALT 14; BUN 15; Creatinine, Ser 0.79; Hemoglobin 14.4; Platelets 184; Potassium 3.9; Sodium 140   Recent Lipid Panel Lab Results  Component Value Date/Time   CHOL 176 03/07/2019 08:43 AM   TRIG 129 03/07/2019 08:43 AM   HDL 64 03/07/2019 08:43 AM   CHOLHDL 2.8 03/07/2019 08:43 AM   CHOLHDL 3.1 04/01/2018 10:48 AM   LDLCALC 90 03/07/2019 08:43 AM   LDLCALC 90 04/01/2018 10:48 AM    Wt Readings from Last 3 Encounters:  05/12/19 149 lb 6 oz (67.8 kg)  05/06/19 151 lb (68.5 kg)  03/27/19 155 lb (70.3 kg)     Objective:    Vital Signs:  BP 127/76    Pulse 61    Ht 5\' 5"  (1.651 m)    Wt 149 lb 6 oz (67.8 kg)    BMI 24.86 kg/m    VITAL SIGNS:  reviewed  ASSESSMENT & PLAN:    1. HTN. Readings are  well controlled on losartan 50 mg daily.   2.   CAD: Last Myoview 2017. She has had recent atypical chest pain. Never had typical pain prior to MI/CABG. I would like to update a [employer].     3.   Chronic systolic heart failure: appears well compensated.   4.   Hyperlipidemia: LDL 90. Will increase Crestor to 20 mg and repeat  Fasting lab in Dec.  5.   Ischemic cardiomyopathy: On losartan, unable to tolerate beta-blocker due to baseline bradycardia.  COVID-19 Education: The signs and symptoms of COVID-19 were discussed with the patient and how to seek care for testing (follow up with PCP or arrange E-visit).  The importance of social distancing was discussed today.  Time:   Today, I have spent 14 minutes with the patient with telehealth technology discussing the above problems.     Medication Adjustments/Labs and Tests Ordered: Current medicines are reviewed at length with the patient today.  Concerns regarding medicines are outlined above.   Tests Ordered: Lexiscan Myoview  Medication Changes: No orders of the defined types were placed in this encounter.   Follow Up:  In Person in 6 month(s)  Signed, Roshunda Keir Martinique, MD  05/12/2019 9:20 AM    [place]

## 2019-05-12 ENCOUNTER — Telehealth (INDEPENDENT_AMBULATORY_CARE_PROVIDER_SITE_OTHER): Payer: PPO | Admitting: Cardiology

## 2019-05-12 ENCOUNTER — Ambulatory Visit: Payer: PPO | Admitting: Cardiology

## 2019-05-12 ENCOUNTER — Encounter: Payer: Self-pay | Admitting: Cardiology

## 2019-05-12 VITALS — BP 127/76 | HR 61 | Ht 65.0 in | Wt 149.4 lb

## 2019-05-12 DIAGNOSIS — I2510 Atherosclerotic heart disease of native coronary artery without angina pectoris: Secondary | ICD-10-CM

## 2019-05-12 DIAGNOSIS — Z951 Presence of aortocoronary bypass graft: Secondary | ICD-10-CM

## 2019-05-12 DIAGNOSIS — E7800 Pure hypercholesterolemia, unspecified: Secondary | ICD-10-CM

## 2019-05-12 DIAGNOSIS — I5022 Chronic systolic (congestive) heart failure: Secondary | ICD-10-CM

## 2019-05-12 DIAGNOSIS — R072 Precordial pain: Secondary | ICD-10-CM

## 2019-05-12 DIAGNOSIS — I10 Essential (primary) hypertension: Secondary | ICD-10-CM

## 2019-05-12 NOTE — Patient Instructions (Signed)
Medication Instructions:  Continue same medications *If you need a refill on your cardiac medications before your next appointment, please call your pharmacy*  Lab Work: Have fasting Lab done in December      Testing/Procedures: Schedule [place] will call with appointment   Follow-Up: At [facility], you and your health needs are our priority.  As part of our continuing mission to provide you with exceptional heart care, we have created designated Provider Care Teams.  These Care Teams include your primary Cardiologist (physician) and Advanced Practice Providers (APPs -  Physician Assistants and Nurse Practitioners) who all work together to provide you with the care you need, when you need it.  Your next appointment:  6 months    Call in Feb to schedule May appointment    The format for your next appointment:  Office   Provider:  Dr.Jordan

## 2019-05-12 NOTE — Addendum Note (Signed)
Addended by: Kathyrn Lass on: 05/12/2019 10:12 AM   Modules accepted: Orders

## 2019-05-17 ENCOUNTER — Encounter (HOSPITAL_COMMUNITY): Payer: PPO

## 2019-05-19 ENCOUNTER — Encounter: Payer: Self-pay | Admitting: Family Medicine

## 2019-05-19 ENCOUNTER — Telehealth (HOSPITAL_COMMUNITY): Payer: Self-pay

## 2019-05-19 DIAGNOSIS — M8589 Other specified disorders of bone density and structure, multiple sites: Secondary | ICD-10-CM | POA: Diagnosis not present

## 2019-05-19 DIAGNOSIS — Z1231 Encounter for screening mammogram for malignant neoplasm of breast: Secondary | ICD-10-CM | POA: Diagnosis not present

## 2019-05-19 DIAGNOSIS — Z780 Asymptomatic menopausal state: Secondary | ICD-10-CM | POA: Diagnosis not present

## 2019-05-19 DIAGNOSIS — Z8262 Family history of osteoporosis: Secondary | ICD-10-CM | POA: Diagnosis not present

## 2019-05-19 NOTE — Telephone Encounter (Signed)
Encounter complete. 

## 2019-05-23 ENCOUNTER — Ambulatory Visit (HOSPITAL_COMMUNITY)
Admission: RE | Admit: 2019-05-23 | Discharge: 2019-05-23 | Disposition: A | Discharge status: Home / Self Care | Payer: PPO | Source: Ambulatory Visit | Attending: Cardiovascular Disease | Admitting: Cardiovascular Disease

## 2019-05-23 ENCOUNTER — Other Ambulatory Visit: Payer: Self-pay

## 2019-05-23 DIAGNOSIS — R072 Precordial pain: Secondary | ICD-10-CM | POA: Diagnosis not present

## 2019-05-23 LAB — MYOCARDIAL PERFUSION IMAGING
LV dias vol: 116 mL (ref 46–106)
LV sys vol: 60 mL
Peak HR: 97 {beats}/min
Rest HR: 59 {beats}/min
SDS: 1
SRS: 20
SSS: 21
TID: 1.12

## 2019-05-23 MED ORDER — REGADENOSON 0.4 MG/5ML IV SOLN
0.4000 mg | Freq: Once | INTRAVENOUS | Status: AC
  Administered 2019-05-23: 0.4 mg via INTRAVENOUS

## 2019-05-23 MED ORDER — AMINOPHYLLINE 25 MG/ML IV SOLN
75.0000 mg | Freq: Once | INTRAVENOUS | Status: AC
  Administered 2019-05-23: 75 mg via INTRAVENOUS

## 2019-05-23 MED ORDER — TECHNETIUM TC 99M TETROFOSMIN IV KIT
10.9000 | PACK | Freq: Once | INTRAVENOUS | Status: AC | PRN
  Administered 2019-05-23: 10.9 via INTRAVENOUS
  Filled 2019-05-23: qty 11

## 2019-05-23 MED ORDER — TECHNETIUM TC 99M TETROFOSMIN IV KIT
31.2000 | PACK | Freq: Once | INTRAVENOUS | Status: AC | PRN
  Administered 2019-05-23: 31.2 via INTRAVENOUS
  Filled 2019-05-23: qty 32

## 2019-06-09 DIAGNOSIS — I10 Essential (primary) hypertension: Secondary | ICD-10-CM | POA: Diagnosis not present

## 2019-06-09 DIAGNOSIS — I2510 Atherosclerotic heart disease of native coronary artery without angina pectoris: Secondary | ICD-10-CM | POA: Diagnosis not present

## 2019-06-09 DIAGNOSIS — E7800 Pure hypercholesterolemia, unspecified: Secondary | ICD-10-CM | POA: Diagnosis not present

## 2019-06-09 DIAGNOSIS — I5022 Chronic systolic (congestive) heart failure: Secondary | ICD-10-CM | POA: Diagnosis not present

## 2019-06-09 DIAGNOSIS — Z951 Presence of aortocoronary bypass graft: Secondary | ICD-10-CM | POA: Diagnosis not present

## 2019-06-10 LAB — BASIC METABOLIC PANEL
BUN/Creatinine Ratio: 18 (ref 12–28)
BUN: 15 mg/dL (ref 8–27)
CO2: 24 mmol/L (ref 20–29)
Calcium: 10 mg/dL (ref 8.7–10.3)
Chloride: 105 mmol/L (ref 96–106)
Creatinine, Ser: 0.84 mg/dL (ref 0.57–1.00)
GFR calc Af Amer: 79 mL/min/{1.73_m2} (ref >59)
GFR calc non Af Amer: 69 mL/min/{1.73_m2} (ref >59)
Glucose: 89 mg/dL (ref 65–99)
Potassium: 4.6 mmol/L (ref 3.5–5.2)
Sodium: 141 mmol/L (ref 134–144)

## 2019-06-10 LAB — LIPID PANEL
Chol/HDL Ratio: 2.5 ratio (ref 0.0–4.4)
Cholesterol, Total: 165 mg/dL (ref 100–199)
HDL: 66 mg/dL (ref >39)
LDL Chol Calc (NIH): 83 mg/dL (ref 0–99)
Triglycerides: 84 mg/dL (ref 0–149)
VLDL Cholesterol Cal: 16 mg/dL (ref 5–40)

## 2019-06-10 LAB — HEPATIC FUNCTION PANEL
ALT: 13 IU/L (ref 0–32)
AST: 19 IU/L (ref 0–40)
Albumin: 4.1 g/dL (ref 3.7–4.7)
Alkaline Phosphatase: 116 IU/L (ref 39–117)
Bilirubin Total: 0.6 mg/dL (ref 0.0–1.2)
Bilirubin, Direct: 0.16 mg/dL (ref 0.00–0.40)
Total Protein: 6.4 g/dL (ref 6.0–8.5)

## 2019-06-15 ENCOUNTER — Telehealth: Payer: Self-pay

## 2019-06-15 DIAGNOSIS — E785 Hyperlipidemia, unspecified: Secondary | ICD-10-CM

## 2019-06-15 MED ORDER — ROSUVASTATIN CALCIUM 40 MG PO TABS: 40.0000 mg | ORAL_TABLET | Freq: Every day | ORAL | 3 refills | Status: DC

## 2019-06-15 NOTE — Telephone Encounter (Signed)
Spoke to patient lab results given. 

## 2019-06-19 ENCOUNTER — Other Ambulatory Visit: Payer: Self-pay

## 2019-06-19 DIAGNOSIS — E785 Hyperlipidemia, unspecified: Secondary | ICD-10-CM

## 2019-07-06 DIAGNOSIS — L821 Other seborrheic keratosis: Secondary | ICD-10-CM | POA: Diagnosis not present

## 2019-07-06 DIAGNOSIS — L710 Perioral dermatitis: Secondary | ICD-10-CM | POA: Diagnosis not present

## 2019-07-06 DIAGNOSIS — Z86018 Personal history of other benign neoplasm: Secondary | ICD-10-CM | POA: Diagnosis not present

## 2019-07-06 DIAGNOSIS — Z23 Encounter for immunization: Secondary | ICD-10-CM | POA: Diagnosis not present

## 2019-07-06 DIAGNOSIS — D225 Melanocytic nevi of trunk: Secondary | ICD-10-CM | POA: Diagnosis not present

## 2019-07-22 ENCOUNTER — Other Ambulatory Visit: Payer: Self-pay | Admitting: Family Medicine

## 2019-07-24 NOTE — Telephone Encounter (Signed)
Ok to refill??  Last office visit 03/27/2019.  Last refill 11/07/2018, #2 refills.

## 2019-08-02 DIAGNOSIS — L0109 Other impetigo: Secondary | ICD-10-CM | POA: Diagnosis not present

## 2019-08-02 DIAGNOSIS — H95122 Granulation of postmastoidectomy cavity, left ear: Secondary | ICD-10-CM | POA: Diagnosis not present

## 2019-08-03 ENCOUNTER — Encounter: Payer: Self-pay | Admitting: Family Medicine

## 2019-08-29 DIAGNOSIS — Z888 Allergy status to other drugs, medicaments and biological substances status: Secondary | ICD-10-CM | POA: Diagnosis not present

## 2019-08-29 DIAGNOSIS — R52 Pain, unspecified: Secondary | ICD-10-CM | POA: Diagnosis not present

## 2019-08-29 DIAGNOSIS — S299XXA Unspecified injury of thorax, initial encounter: Secondary | ICD-10-CM | POA: Diagnosis not present

## 2019-08-29 DIAGNOSIS — G8911 Acute pain due to trauma: Secondary | ICD-10-CM | POA: Diagnosis not present

## 2019-08-29 DIAGNOSIS — Z79899 Other long term (current) drug therapy: Secondary | ICD-10-CM | POA: Diagnosis not present

## 2019-08-29 DIAGNOSIS — I509 Heart failure, unspecified: Secondary | ICD-10-CM | POA: Diagnosis not present

## 2019-08-29 DIAGNOSIS — Z951 Presence of aortocoronary bypass graft: Secondary | ICD-10-CM | POA: Diagnosis not present

## 2019-08-29 DIAGNOSIS — Z7982 Long term (current) use of aspirin: Secondary | ICD-10-CM | POA: Diagnosis not present

## 2019-08-29 DIAGNOSIS — I25810 Atherosclerosis of coronary artery bypass graft(s) without angina pectoris: Secondary | ICD-10-CM | POA: Diagnosis not present

## 2019-08-29 DIAGNOSIS — Z91040 Latex allergy status: Secondary | ICD-10-CM | POA: Diagnosis not present

## 2019-08-29 DIAGNOSIS — Z882 Allergy status to sulfonamides status: Secondary | ICD-10-CM | POA: Diagnosis not present

## 2019-08-29 DIAGNOSIS — R0789 Other chest pain: Secondary | ICD-10-CM | POA: Diagnosis not present

## 2019-08-29 DIAGNOSIS — I110 Hypertensive heart disease with heart failure: Secondary | ICD-10-CM | POA: Diagnosis not present

## 2019-08-29 DIAGNOSIS — Z885 Allergy status to narcotic agent status: Secondary | ICD-10-CM | POA: Diagnosis not present

## 2019-09-05 ENCOUNTER — Encounter: Payer: Self-pay | Admitting: Family Medicine

## 2019-09-05 ENCOUNTER — Ambulatory Visit (INDEPENDENT_AMBULATORY_CARE_PROVIDER_SITE_OTHER): Payer: PPO | Admitting: Family Medicine

## 2019-09-05 ENCOUNTER — Other Ambulatory Visit: Payer: Self-pay

## 2019-09-05 DIAGNOSIS — R0781 Pleurodynia: Secondary | ICD-10-CM | POA: Diagnosis not present

## 2019-09-05 DIAGNOSIS — M546 Pain in thoracic spine: Secondary | ICD-10-CM | POA: Diagnosis not present

## 2019-09-05 DIAGNOSIS — S301XXA Contusion of abdominal wall, initial encounter: Secondary | ICD-10-CM

## 2019-09-05 DIAGNOSIS — M545 Low back pain, unspecified: Secondary | ICD-10-CM

## 2019-09-05 DIAGNOSIS — R1030 Lower abdominal pain, unspecified: Secondary | ICD-10-CM

## 2019-09-05 NOTE — Patient Instructions (Signed)
Xray to be done of spine , ribs CT to be done of abdomen  F/U pending results
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
## 2019-09-06 ENCOUNTER — Ambulatory Visit (HOSPITAL_COMMUNITY)
Admission: RE | Admit: 2019-09-06 | Discharge: 2019-09-06 | Disposition: A | Discharge status: Home / Self Care | Payer: PPO | Source: Ambulatory Visit | Attending: Family Medicine | Admitting: Family Medicine

## 2019-09-06 ENCOUNTER — Encounter (HOSPITAL_COMMUNITY): Payer: Self-pay

## 2019-09-06 DIAGNOSIS — M546 Pain in thoracic spine: Secondary | ICD-10-CM

## 2019-09-06 DIAGNOSIS — M545 Low back pain, unspecified: Secondary | ICD-10-CM

## 2019-09-06 DIAGNOSIS — I700 Atherosclerosis of aorta: Secondary | ICD-10-CM | POA: Insufficient documentation

## 2019-09-06 DIAGNOSIS — S2232XA Fracture of one rib, left side, initial encounter for closed fracture: Secondary | ICD-10-CM | POA: Diagnosis not present

## 2019-09-06 DIAGNOSIS — R0781 Pleurodynia: Secondary | ICD-10-CM

## 2019-09-06 DIAGNOSIS — K869 Disease of pancreas, unspecified: Secondary | ICD-10-CM | POA: Insufficient documentation

## 2019-09-06 DIAGNOSIS — S301XXA Contusion of abdominal wall, initial encounter: Secondary | ICD-10-CM | POA: Insufficient documentation

## 2019-09-06 DIAGNOSIS — R1030 Lower abdominal pain, unspecified: Secondary | ICD-10-CM | POA: Diagnosis not present

## 2019-09-07 ENCOUNTER — Other Ambulatory Visit: Payer: Self-pay | Admitting: *Deleted

## 2019-09-07 ENCOUNTER — Other Ambulatory Visit: Payer: Self-pay | Admitting: Family Medicine

## 2019-09-07 DIAGNOSIS — K8689 Other specified diseases of pancreas: Secondary | ICD-10-CM

## 2019-09-07 DIAGNOSIS — K862 Cyst of pancreas: Secondary | ICD-10-CM

## 2019-09-07 MED ORDER — OXYCODONE-ACETAMINOPHEN 5-325 MG PO TABS: 1.0000 | ORAL_TABLET | Freq: Four times a day (QID) | ORAL | 0 refills | Status: DC | PRN

## 2019-09-07 NOTE — Addendum Note (Signed)
Addended by: Vic Blackbird F on: 09/07/2019 09:23 AM   Modules accepted: Orders

## 2019-09-07 NOTE — Progress Notes (Signed)
Pancreatic mass noted on CT incidental finding  Has cystic components

## 2019-09-11 DIAGNOSIS — E785 Hyperlipidemia, unspecified: Secondary | ICD-10-CM | POA: Diagnosis not present

## 2019-09-12 LAB — HEPATIC FUNCTION PANEL
ALT: 16 IU/L (ref 0–32)
AST: 20 IU/L (ref 0–40)
Albumin: 4.2 g/dL (ref 3.7–4.7)
Alkaline Phosphatase: 137 IU/L — ABNORMAL HIGH (ref 39–117)
Bilirubin Total: 0.4 mg/dL (ref 0.0–1.2)
Bilirubin, Direct: 0.1 mg/dL (ref 0.00–0.40)
Total Protein: 6.8 g/dL (ref 6.0–8.5)

## 2019-09-12 LAB — LIPID PANEL
Chol/HDL Ratio: 2.4 ratio (ref 0.0–4.4)
Cholesterol, Total: 161 mg/dL (ref 100–199)
HDL: 66 mg/dL (ref >39)
LDL Chol Calc (NIH): 74 mg/dL (ref 0–99)
Triglycerides: 119 mg/dL (ref 0–149)
VLDL Cholesterol Cal: 21 mg/dL (ref 5–40)

## 2019-09-13 ENCOUNTER — Encounter: Payer: Self-pay | Admitting: Family Medicine

## 2019-09-13 ENCOUNTER — Other Ambulatory Visit: Payer: Self-pay

## 2019-09-13 ENCOUNTER — Ambulatory Visit (INDEPENDENT_AMBULATORY_CARE_PROVIDER_SITE_OTHER): Payer: PPO | Admitting: Family Medicine

## 2019-09-13 VITALS — BP 110/58 | HR 70 | Temp 98.7°F | Resp 14 | Ht 65.0 in | Wt 153.0 lb

## 2019-09-13 DIAGNOSIS — S2232XD Fracture of one rib, left side, subsequent encounter for fracture with routine healing: Secondary | ICD-10-CM

## 2019-09-13 DIAGNOSIS — M5135 Other intervertebral disc degeneration, thoracolumbar region: Secondary | ICD-10-CM | POA: Insufficient documentation

## 2019-09-13 DIAGNOSIS — K5904 Chronic idiopathic constipation: Secondary | ICD-10-CM | POA: Diagnosis not present

## 2019-09-13 DIAGNOSIS — K862 Cyst of pancreas: Secondary | ICD-10-CM | POA: Diagnosis not present

## 2019-09-13 DIAGNOSIS — I2510 Atherosclerotic heart disease of native coronary artery without angina pectoris: Secondary | ICD-10-CM | POA: Diagnosis not present

## 2019-09-13 NOTE — Assessment & Plan Note (Signed)
ADD MIRALAX to regimen

## 2019-09-13 NOTE — Assessment & Plan Note (Signed)
Noted on her CT scan.  She was already on maximum dose of Crestor and followed by cardiology.
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
## 2019-09-13 NOTE — Patient Instructions (Signed)
F/u PENDING MRI results

## 2019-09-17 ENCOUNTER — Encounter: Payer: Self-pay | Admitting: Family Medicine

## 2019-09-18 ENCOUNTER — Other Ambulatory Visit: Payer: Self-pay

## 2019-09-18 ENCOUNTER — Ambulatory Visit (INDEPENDENT_AMBULATORY_CARE_PROVIDER_SITE_OTHER): Payer: PPO | Admitting: Nurse Practitioner

## 2019-09-18 ENCOUNTER — Encounter: Payer: Self-pay | Admitting: Nurse Practitioner

## 2019-09-18 VITALS — BP 120/64 | HR 74 | Temp 98.3°F | Resp 18 | Ht 65.0 in | Wt 151.2 lb

## 2019-09-18 DIAGNOSIS — B0233 Zoster keratitis: Secondary | ICD-10-CM | POA: Diagnosis not present

## 2019-09-18 DIAGNOSIS — B029 Zoster without complications: Secondary | ICD-10-CM

## 2019-09-18 HISTORY — DX: Zoster without complications: B02.9

## 2019-09-18 MED ORDER — VALACYCLOVIR HCL 1 G PO TABS: 1000.0000 mg | ORAL_TABLET | Freq: Three times a day (TID) | ORAL | 0 refills | Status: AC

## 2019-09-18 NOTE — Progress Notes (Signed)
Acute Office Visit  Subjective:    Patient ID: Kelli Roy, female    DOB: [birth year], 74 y.o.   MRN: [id number]  Chief Complaint: think I have shingles  HPI Patient is a 74 year old caucasian female presenting for red rash that started on Friday increasing through Sunday. The rash now has fluid filled blisters and follows a dermatone pattern from left torso to left back.  She denied other contact with similar sxs, no fever, chills, pain, itching. No exacerbating or alleviating factors. No txs tried. She reports she feels mild discomfort. She reports that she does have Oxy pain medication that was recently prescribed for rib injury.   Past Medical History:  Diagnosis Date  . Allergy   . Alopecia   . Arthritis   . CAD (coronary artery disease)    a. 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 Cyndia Bent   . Cataract   . Chronic systolic CHF (congestive heart failure) ([place])    a. 03/2014 Echo: EF 25-30%; Pre op TEE 05/2014 with EF of 50%  . Family history of cardiovascular disease   . Family history of colon cancer   . History of long-term treatment with high-risk medication   . Hyperlipidemia   . Hypertension   . Insomnia   . Ischemic cardiomyopathy    a. 03/2014 Echo: EF 25-30%, mid-apical/anteroseptal and inf AK, Gr 2 DD, ? apical thrombus, Mild MR.-->Discharged with LifeVest.  . Malaise and fatigue   . Myocardial infarction ([place]) 03/2014  . Shortness of breath dyspnea   . Sinus headache     Past Surgical History:  Procedure Laterality Date  . CARDIAC CATHETERIZATION  2001   showed no blockages  . CATARACT EXTRACTION W/PHACO Left 01/04/2014   Procedure: CATARACT EXTRACTION PHACO AND INTRAOCULAR LENS PLACEMENT (IOC);  Surgeon: Tonny Branch, MD;  Location: [facility];  Service:  Ophthalmology;  Laterality: Left;  CDE:5.65  . CATARACT EXTRACTION W/PHACO Right 01/29/2014   Procedure: CATARACT EXTRACTION PHACO AND INTRAOCULAR LENS PLACEMENT RIGHT EYE CDE=4.49;  Surgeon: Tonny Branch, MD;  Location: [facility];  Service: Ophthalmology;  Laterality: Right;  . CERVICAL SPINE SURGERY  09/27/2003   C6-67 servical fusion  . COLONOSCOPY WITH PROPOFOL N/A 05/18/2016   Procedure: COLONOSCOPY WITH PROPOFOL;  Surgeon: Garlan Fair, MD;  Location: [facility];  Service: Endoscopy;  Laterality: N/A;  . CORONARY ARTERY BYPASS GRAFT N/A 05/21/2014   Procedure: CORONARY ARTERY BYPASS GRAFTING (CABG);  Surgeon: Gaye Pollack, MD;  Location: [facility];  Service: Open Heart Surgery;  Laterality: N/A;  . CYSTECTOMY  1998   fibroid cysts  . DILATION AND CURETTAGE OF UTERUS  1976   2nd to miscarriage  . EYE SURGERY    . INTRAOPERATIVE TRANSESOPHAGEAL ECHOCARDIOGRAM N/A 05/21/2014   Procedure: INTRAOPERATIVE TRANSESOPHAGEAL ECHOCARDIOGRAM;  Surgeon: Gaye Pollack, MD;  Location: [facility] OR;  Service: Open Heart Surgery;  Laterality: N/A;  . LEFT HEART CATHETERIZATION WITH CORONARY ANGIOGRAM N/A 03/08/2014   Procedure: LEFT HEART CATHETERIZATION WITH CORONARY ANGIOGRAM;  Surgeon: Peter M Martinique, MD;  Location: [facility] CATH LAB;  Service: Cardiovascular;  Laterality: N/A;  . 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 PERCUTANEOUS CORONARY STENT INTERVENTION (PCI-S)  03/08/2014   Procedure: PERCUTANEOUS CORONARY STENT INTERVENTION (PCI-S);  Surgeon: Peter M Martinique, MD;  Location: [facility] CATH LAB;  Service: Cardiovascular;;  prox lad  bms    Family History  Problem Relation Age of Onset  . Stroke Mother   . Heart attack Mother   . Heart disease Mother   . Peripheral vascular disease Father        had stent in leg  . Colon cancer Father   . Atrial fibrillation Father        with pacemaker  . Thyroid disease Father   . Cancer Father   . Hearing loss Father   .  Hypertension Father   . Pancreatic cancer Brother   . Cancer Brother   . Hypertension Brother   . Coronary artery disease Brother        with Stent  . Cancer Brother   . Hypertension Brother   . Coronary artery disease Brother        with CABG in his 55's  . Hypertension Brother   . Thyroid disease Brother   . Hypertension Brother   . Thyroid disease Sister     Social History   Socioeconomic History  . Marital status: Married    Spouse name: Not on file  . Number of children: Not on file  . Years of education: Not on file  . Highest education level: Not on file  Occupational History  . Not on file  Tobacco Use  . Smoking status: Never Smoker  . Smokeless tobacco: Never Used  Substance and Sexual Activity  . Alcohol use: Not Currently    Comment: rare glass of wine  . Drug use: No  . Sexual activity: Not Currently    Birth control/protection: Post-menopausal  Other Topics Concern  . Not on file  Social History Narrative  . Not on file   Social Determinants of Health   Financial Resource Strain:   . Difficulty of Paying Living Expenses:   Food Insecurity:   . Worried About Charity fundraiser in the Last Year:   . Arboriculturist in the Last Year:   Transportation Needs:   . Film/video editor (Medical):   Marland Kitchen Lack of Transportation (Non-Medical):   Physical Activity:   . Days of Exercise per Week:   . Minutes of Exercise per Session:   Stress:   . Feeling of Stress :   Social Connections:   . Frequency of Communication with Friends and Family:   . Frequency of Social Gatherings with Friends and Family:   . Attends Religious Services:   . Active Member of Clubs or Organizations:   . Attends Archivist Meetings:   Marland Kitchen Marital Status:   Intimate Partner Violence:   . Fear of Current or Ex-Partner:   . Emotionally Abused:   Marland Kitchen Physically Abused:   . Sexually Abused:     Outpatient Medications Prior to Visit  Medication Sig Dispense Refill  .  acetaminophen (TYLENOL) 500 MG tablet Take 500 mg by mouth every 6 (six) hours as needed for moderate pain or headache.     Marland Kitchen aspirin EC 81 MG tablet Take 81 mg by mouth at bedtime.     . Cholecalciferol (VITAMIN D3 PO) Take 1 capsule by mouth daily.     . Coenzyme Q10-Vitamin E (QUNOL ULTRA COQ10 PO) Take 200 mg by mouth at bedtime.     . Cyanocobalamin (VITAMIN B12) 1000 MCG TBCR Take 1,000 mcg by  mouth daily.    Marland Kitchen docusate sodium (COLACE) 100 MG capsule Take 100-200 mg by mouth 2 (two) times daily. Take 100 mg by mouth in the morning and 200 mg at bedtime    . eszopiclone (LUNESTA) 1 MG TABS tablet TAKE A HALF TABLET AT BEDTIME AS NEEDED 30 tablet 2  . furosemide (LASIX) 20 MG tablet may take an additional 20 mg as needed (Patient taking differently: 20 mg daily. may take an additional 20 mg) 180 tablet 0  . ipratropium (ATROVENT) 0.06 % nasal spray Place 2 sprays into both nostrils 4 (four) times daily.    Marland Kitchen loratadine (CLARITIN) 10 MG tablet Take 10 mg by mouth daily as needed for allergies or rhinitis.    Marland Kitchen losartan (COZAAR) 50 MG tablet Take 1 tablet (50 mg total) by mouth daily. 90 tablet 3  . Magnesium Cl-Calcium Carbonate (SLOW-MAG PO) Take by mouth daily.     . nitroGLYCERIN (NITROSTAT) 0.4 MG SL tablet Place 1 tablet (0.4 mg total) under the tongue every 5 (five) minutes as needed for chest pain. 25 tablet 3  . oxyCODONE-acetaminophen (PERCOCET) 5-325 MG tablet Take 1 tablet by mouth every 6 (six) hours as needed for severe pain. 20 tablet 0  . tetrahydrozoline-zinc (VISINE-AC) 0.05-0.25 % ophthalmic solution Place 2 drops into both eyes daily as needed (for dry or irritated eyes).     . rosuvastatin (CRESTOR) 40 MG tablet Take 1 tablet (40 mg total) by mouth daily. 90 tablet 3   No facility-administered medications prior to visit.    Allergies  Allergen Reactions  . Carvedilol Shortness Of Breath and Other (See Comments)    Also caused fluid to accumulate around her heart;  resulted in hospitalization  . Spironolactone Shortness Of Breath and Other (See Comments)    Also caused fluid to accumulate around her heart; resulted in hospitalization  . Demerol [Meperidine] Nausea Only  . Lipitor [Atorvastatin] Itching  . Lisinopril Cough  . Dexilant [Dexlansoprazole] Rash  . Latex Rash  . Neomycin Rash  . Plavix [Clopidogrel Bisulfate] Rash  . Sulfonamide Derivatives Rash  . Ticagrelor Itching and Rash    Brilinta     Review of Systems  Constitutional: Negative for chills and fever.  Skin: Positive for rash.  Neurological: Negative for dizziness, facial asymmetry, light-headedness, numbness and headaches.  All other systems reviewed and are negative.      Objective:    Physical Exam Constitutional:      General: She is not in acute distress.    Appearance: Normal appearance. She is not ill-appearing or toxic-appearing.  HENT:     Head: Normocephalic.  Eyes:     General: Lids are normal. Lids are everted, no foreign bodies appreciated. No scleral icterus.    Extraocular Movements:     Right eye: Normal extraocular motion and no nystagmus.     Left eye: Normal extraocular motion and no nystagmus.     Conjunctiva/sclera:     Right eye: Right conjunctiva is not injected. No exudate.    Left eye: Left conjunctiva is not injected. No exudate. Cardiovascular:     Rate and Rhythm: Normal rate.  Pulmonary:     Effort: Pulmonary effort is normal.  Musculoskeletal:     Cervical back: Neck supple.  Skin:    General: Skin is warm and dry.     Capillary Refill: Capillary refill takes less than 2 seconds.     Findings: Erythema, lesion and rash present.  Comments: erythremic rash with blister in dermatoid pattern consistent with Shingles   Neurological:     General: No focal deficit present.     Mental Status: She is alert and oriented to person, place, and time.     BP 120/64 (BP Location: Left Arm, Patient Position: Sitting, Cuff Size:  Normal)   Pulse 74   Temp 98.3 F (36.8 C) (Oral)   Resp 18   Ht 5\' 5"  (1.651 m)   Wt 151 lb 3.2 oz (68.6 kg)   SpO2 96%   BMI 25.16 kg/m  Wt Readings from Last 3 Encounters:  09/18/19 151 lb 3.2 oz (68.6 kg)  09/13/19 153 lb (69.4 kg)  09/05/19 153 lb (69.4 kg)    There are no preventive care reminders to display for this patient.  There are no preventive care reminders to display for this patient.   Lab Results  Component Value Date   TSH 2.50 01/03/2016   Lab Results  Component Value Date   WBC 8.0 05/06/2019   HGB 14.4 05/06/2019   HCT 44.3 05/06/2019   MCV 95.7 05/06/2019   PLT 184 05/06/2019   Lab Results  Component Value Date   NA 141 06/09/2019   K 4.6 06/09/2019   CO2 24 06/09/2019   GLUCOSE 89 06/09/2019   BUN 15 06/09/2019   CREATININE 0.84 06/09/2019   BILITOT 0.4 09/11/2019   ALKPHOS 137 (H) 09/11/2019   AST 20 09/11/2019   ALT 16 09/11/2019   PROT 6.8 09/11/2019   ALBUMIN 4.2 09/11/2019   CALCIUM 10.0 06/09/2019   ANIONGAP 7 05/06/2019   GFR 95.98 06/06/2014   Lab Results  Component Value Date   CHOL 161 09/11/2019   Lab Results  Component Value Date   HDL 66 09/11/2019   Lab Results  Component Value Date   LDLCALC 74 09/11/2019   Lab Results  Component Value Date   TRIG 119 09/11/2019   Lab Results  Component Value Date   CHOLHDL 2.4 09/11/2019   Lab Results  Component Value Date   HGBA1C 6.1 (H) 05/17/2014       Assessment & Plan:  Your exam is consistent with Shingles, print out education provided and verbally reviewed. You reporting having left over Oxy from rib injury, you may take for pain and discomfort as needed.   Problem List Items Addressed This Visit    None    Visit Diagnoses    Herpes zoster keratitis    -  Primary   Relevant Medications   valACYclovir (VALTREX) 1000 MG tablet       Meds ordered this encounter  Medications  . valACYclovir (VALTREX) 1000 MG tablet    Sig: Take 1 tablet (1,000 mg  total) by mouth 3 (three) times daily for 7 days.    Dispense:  20 tablet    Refill:  0   Follow up: as needed for worsening or non resolving symptoms  Annie Main, FNP

## 2019-09-18 NOTE — Telephone Encounter (Signed)
Front desk has scheduled patient for an appointment.

## 2019-09-25 ENCOUNTER — Encounter: Payer: Self-pay | Admitting: Family Medicine

## 2019-10-04 ENCOUNTER — Ambulatory Visit (HOSPITAL_COMMUNITY): Payer: PPO

## 2019-10-04 ENCOUNTER — Encounter: Payer: Self-pay | Admitting: Family Medicine

## 2019-10-06 ENCOUNTER — Ambulatory Visit (HOSPITAL_COMMUNITY): Payer: PPO

## 2019-10-09 NOTE — Telephone Encounter (Signed)
I called [facility] and [facility] and neither facility could get her in sooner than the 14th. There are no sooner appointments with any of our [facility] facilities either. Will the 14th  be ok?

## 2019-10-18 ENCOUNTER — Ambulatory Visit (HOSPITAL_COMMUNITY)
Admission: RE | Admit: 2019-10-18 | Discharge: 2019-10-18 | Disposition: A | Discharge status: Home / Self Care | Payer: PPO | Source: Ambulatory Visit | Attending: Family Medicine | Admitting: Family Medicine

## 2019-10-18 ENCOUNTER — Other Ambulatory Visit: Payer: Self-pay

## 2019-10-18 ENCOUNTER — Other Ambulatory Visit: Payer: Self-pay | Admitting: Family Medicine

## 2019-10-18 DIAGNOSIS — K862 Cyst of pancreas: Secondary | ICD-10-CM | POA: Diagnosis not present

## 2019-10-18 DIAGNOSIS — K8689 Other specified diseases of pancreas: Secondary | ICD-10-CM | POA: Diagnosis not present

## 2019-10-18 MED ORDER — GADOBUTROL 1 MMOL/ML IV SOLN
7.0000 mL | Freq: Once | INTRAVENOUS | Status: AC | PRN
  Administered 2019-10-18: 7 mL via INTRAVENOUS

## 2019-12-06 ENCOUNTER — Other Ambulatory Visit: Payer: Self-pay | Admitting: Cardiology
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
## 2019-12-18 ENCOUNTER — Other Ambulatory Visit: Payer: Self-pay

## 2019-12-18 ENCOUNTER — Encounter: Payer: Self-pay | Admitting: Cardiology

## 2019-12-18 ENCOUNTER — Ambulatory Visit: Payer: PPO | Admitting: Cardiology

## 2019-12-18 VITALS — BP 118/50 | HR 60 | Ht 65.5 in | Wt 150.2 lb

## 2019-12-18 DIAGNOSIS — E785 Hyperlipidemia, unspecified: Secondary | ICD-10-CM

## 2019-12-18 DIAGNOSIS — I2510 Atherosclerotic heart disease of native coronary artery without angina pectoris: Secondary | ICD-10-CM | POA: Diagnosis not present

## 2019-12-18 DIAGNOSIS — I10 Essential (primary) hypertension: Secondary | ICD-10-CM

## 2020-01-09 ENCOUNTER — Other Ambulatory Visit: Payer: Self-pay | Admitting: Cardiology

## 2020-01-31 DIAGNOSIS — J340 Abscess, furuncle and carbuncle of nose: Secondary | ICD-10-CM | POA: Diagnosis not present

## 2020-01-31 DIAGNOSIS — H95122 Granulation of postmastoidectomy cavity, left ear: Secondary | ICD-10-CM | POA: Diagnosis not present

## 2020-02-05 ENCOUNTER — Encounter: Payer: Self-pay | Admitting: Family Medicine

## 2020-02-15 ENCOUNTER — Telehealth: Payer: Self-pay | Admitting: Family Medicine

## 2020-02-15 NOTE — Progress Notes (Signed)
°  Chronic Care Management   Outreach Note  02/15/2020 Name: Kelli Roy MRN: [id number] DOB: [birth year]  Referred by: Alycia Rossetti, MD Reason for referral : No chief complaint on file.   An unsuccessful telephone outreach was attempted today. The patient was referred to the pharmacist for assistance with care management and care coordination.   Follow Up Plan:   Carley Perdue [employer] Scheduler

## 2020-02-19 ENCOUNTER — Telehealth: Payer: Self-pay | Admitting: Family Medicine

## 2020-02-19 NOTE — Telephone Encounter (Signed)
CB# [phone] Pt has schedule her CPE 04-19-20  wasn't sure if another MRI of her pancreas need be schedule before she has her CPE visit

## 2020-02-21 ENCOUNTER — Telehealth: Payer: Self-pay | Admitting: Family Medicine

## 2020-02-21 NOTE — Progress Notes (Signed)
°  Chronic Care Management   Note  02/21/2020 Name: Kelli Roy MRN: [id number] DOB: 03-22-1946  Kelli Roy is a 74 y.o. year old female who is a primary care patient of [place], Modena Nunnery, MD. I reached out to [place] by phone today in response to a referral sent by Ms. Kelli Baas Hard[place]'s PCP, Alycia Rossetti, MD.   Kelli Roy was given information about Chronic Care Management services today including:  1. CCM service includes personalized support from designated clinical staff supervised by her physician, including individualized plan of care and coordination with other care providers 2. 24/7 contact phone numbers for assistance for urgent and routine care needs. 3. Service will only be billed when office clinical staff spend 20 minutes or more in a month to coordinate care. 4. Only one practitioner may furnish and bill the service in a calendar month. 5. The patient may stop CCM services at any time (effective at the end of the month) by phone call to the office staff.   Patient agreed to services and verbal consent obtained.   Follow up plan:   Carley Perdue [employer] Scheduler

## 2020-02-21 NOTE — Telephone Encounter (Signed)
Per MRI results dated 10/18/2019, patient will need F/U MRI in 6 months.   Ok to order?  If unable to schedule prior to CPE, should CPE be re-scheduled?

## 2020-02-21 NOTE — Telephone Encounter (Signed)
°  No keep appointment MRI is not needed until Oct since she had done in April I will order at that visit

## 2020-02-22 NOTE — Telephone Encounter (Signed)
Call placed to patient and patient made aware.  

## 2020-03-20 ENCOUNTER — Encounter: Payer: Self-pay | Admitting: Family Medicine

## 2020-03-20 ENCOUNTER — Other Ambulatory Visit: Payer: Self-pay

## 2020-03-20 ENCOUNTER — Ambulatory Visit (INDEPENDENT_AMBULATORY_CARE_PROVIDER_SITE_OTHER): Payer: PPO | Admitting: Family Medicine

## 2020-03-20 VITALS — BP 136/74 | HR 60 | Temp 98.7°F | Resp 16 | Ht 65.5 in | Wt 151.0 lb

## 2020-03-20 DIAGNOSIS — R319 Hematuria, unspecified: Secondary | ICD-10-CM | POA: Diagnosis not present

## 2020-03-20 DIAGNOSIS — N390 Urinary tract infection, site not specified: Secondary | ICD-10-CM | POA: Diagnosis not present

## 2020-03-20 DIAGNOSIS — R300 Dysuria: Secondary | ICD-10-CM | POA: Diagnosis not present

## 2020-03-20 DIAGNOSIS — H9201 Otalgia, right ear: Secondary | ICD-10-CM | POA: Diagnosis not present

## 2020-03-20 LAB — URINALYSIS, ROUTINE W REFLEX MICROSCOPIC
Bacteria, UA: NONE SEEN /HPF
Bilirubin Urine: NEGATIVE
Glucose, UA: NEGATIVE
Hyaline Cast: NONE SEEN /LPF
Ketones, ur: NEGATIVE
Nitrite: NEGATIVE
Specific Gravity, Urine: 1.02 (ref 1.001–1.03)
pH: 6 (ref 5.0–8.0)

## 2020-03-20 LAB — MICROSCOPIC MESSAGE

## 2020-03-20 MED ORDER — CEPHALEXIN 500 MG PO CAPS: 500.0000 mg | ORAL_CAPSULE | Freq: Four times a day (QID) | ORAL | 0 refills | Status: DC

## 2020-03-20 MED ORDER — FLUCONAZOLE 150 MG PO TABS: 150.0000 mg | ORAL_TABLET | Freq: Once | ORAL | 0 refills | Status: AC

## 2020-03-20 NOTE — Progress Notes (Signed)
   Subjective:    Patient ID: Kelli Roy, female    DOB: [birth year], 74 y.o.   MRN: [id number]  Patient presents for Dysuria (x2 days- intermittent pressure and urgency)  Pt here with UTI symptoms for the past 2 days Pressure and urgency ,,no fever, no chills No abd pain  No nauea   Right ear pain- she has also noted some pustularlesions intermittantly on her face, more acne like bumps using clearisil ,she does get little bumps in her ear that cause pain Has had pain radiating from right ear to jaw line She has sinus drainage, but no pressure, this is chronic     Review Of Systems:  GEN- denies fatigue, fever, weight loss,weakness, recent illness HEENT- denies eye drainage, change in vision, nasal discharge, CVS- denies chest pain, palpitations RESP- denies SOB, cough, wheeze ABD- denies N/V, change in stools, abd pain GU- + dysuria, hematuria, dribbling, incontinence MSK- denies joint pain, muscle aches, injury Neuro- denies headache, dizziness, syncope, seizure activity       Objective:    BP 136/74   Pulse 60   Temp 98.7 F (37.1 C) (Temporal)   Resp 16   Ht 5' 5.5" (1.664 m)   Wt 151 lb (68.5 kg)   SpO2 96%   BMI 24.75 kg/m  GEN- NAD, alert and oriented x3 HEENT- PERRL, EOMI, non injected sclera, pink conjunctiva,, TM clear, mild erythema over inner tragus, no lesion seen, mild TTP , no sinus tenderness  Neck- Supple, no  LAD CVS- RRR, no murmur RESP-CTAB ABD-NABS,soft,nt,ND, no CVA tenderness  Ext- no edema       Assessment & Plan:     Discussed COVID- 19 vaccine   Problem List Items Addressed This Visit    None    Visit Diagnoses    Urinary tract infection with hematuria, site unspecified    -  Primary   Treat with keflex, diflucan due to yeast after antibiotics    Relevant Medications   cephALEXin (KEFLEX) 500 MG capsule   fluconazole (DIFLUCAN) 150 MG tablet   Other Relevant Orders   Urinalysis, Routine w reflex microscopic   Right ear pain        possibly getting blackheads beneath the skin, no sign of OE/OM       Note: This dictation was prepared with Dragon dictation along with smaller phrase technology. Any transcriptional errors that result from this process are unintentional.

## 2020-03-20 NOTE — Patient Instructions (Addendum)
F/u as previous 

## 2020-03-27 ENCOUNTER — Encounter: Payer: PPO | Admitting: Family Medicine

## 2020-04-17 ENCOUNTER — Other Ambulatory Visit: Payer: Self-pay | Admitting: *Deleted

## 2020-04-17 DIAGNOSIS — K862 Cyst of pancreas: Secondary | ICD-10-CM

## 2020-04-17 DIAGNOSIS — I2510 Atherosclerotic heart disease of native coronary artery without angina pectoris: Secondary | ICD-10-CM

## 2020-04-17 DIAGNOSIS — S301XXA Contusion of abdominal wall, initial encounter: Secondary | ICD-10-CM

## 2020-04-17 DIAGNOSIS — M5135 Other intervertebral disc degeneration, thoracolumbar region: Secondary | ICD-10-CM

## 2020-04-17 DIAGNOSIS — R1030 Lower abdominal pain, unspecified: Secondary | ICD-10-CM

## 2020-04-17 DIAGNOSIS — I5022 Chronic systolic (congestive) heart failure: Secondary | ICD-10-CM

## 2020-04-17 DIAGNOSIS — M546 Pain in thoracic spine: Secondary | ICD-10-CM

## 2020-04-17 DIAGNOSIS — M545 Low back pain, unspecified: Secondary | ICD-10-CM
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
## 2020-04-18 ENCOUNTER — Telehealth: Payer: Self-pay | Admitting: Pharmacist
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
## 2020-04-19 ENCOUNTER — Ambulatory Visit: Payer: PPO | Admitting: Pharmacist

## 2020-04-19 ENCOUNTER — Other Ambulatory Visit: Payer: Self-pay

## 2020-04-19 ENCOUNTER — Ambulatory Visit (INDEPENDENT_AMBULATORY_CARE_PROVIDER_SITE_OTHER): Payer: PPO | Admitting: Family Medicine

## 2020-04-19 ENCOUNTER — Encounter: Payer: Self-pay | Admitting: Family Medicine

## 2020-04-19 ENCOUNTER — Telehealth: Payer: PPO

## 2020-04-19 ENCOUNTER — Encounter: Payer: PPO | Admitting: Family Medicine

## 2020-04-19 VITALS — BP 126/84 | HR 66 | Temp 98.2°F | Resp 14 | Ht 65.5 in | Wt 150.0 lb

## 2020-04-19 DIAGNOSIS — I2510 Atherosclerotic heart disease of native coronary artery without angina pectoris: Secondary | ICD-10-CM | POA: Diagnosis not present

## 2020-04-19 DIAGNOSIS — Z0000 Encounter for general adult medical examination without abnormal findings: Secondary | ICD-10-CM | POA: Diagnosis not present

## 2020-04-19 DIAGNOSIS — M5135 Other intervertebral disc degeneration, thoracolumbar region: Secondary | ICD-10-CM

## 2020-04-19 DIAGNOSIS — I10 Essential (primary) hypertension: Secondary | ICD-10-CM

## 2020-04-19 DIAGNOSIS — F5101 Primary insomnia: Secondary | ICD-10-CM

## 2020-04-19 DIAGNOSIS — K862 Cyst of pancreas: Secondary | ICD-10-CM | POA: Diagnosis not present

## 2020-04-19 DIAGNOSIS — I5022 Chronic systolic (congestive) heart failure: Secondary | ICD-10-CM

## 2020-04-19 DIAGNOSIS — M8589 Other specified disorders of bone density and structure, multiple sites: Secondary | ICD-10-CM | POA: Diagnosis not present

## 2020-04-19 DIAGNOSIS — E785 Hyperlipidemia, unspecified: Secondary | ICD-10-CM | POA: Diagnosis not present

## 2020-04-19 DIAGNOSIS — M791 Myalgia, unspecified site: Secondary | ICD-10-CM

## 2020-04-19 DIAGNOSIS — Z23 Encounter for immunization: Secondary | ICD-10-CM

## 2020-04-19 MED ORDER — ESZOPICLONE 1 MG PO TABS
ORAL_TABLET | ORAL | 2 refills | Status: DC
Start: 2020-04-19 — End: 2022-03-24

## 2020-04-19 NOTE — Patient Instructions (Addendum)
Visit Information Thank you for meeting with me today!  I look forward to working with you to help you meet all of your healthcare goals and answer any questions you may have.  Feel free to contact me anytime!  Goals Addressed            This Visit's Progress    Pharmacy Care Plan:       CARE PLAN ENTRY (see longitudinal plan of care for additional care plan information)  Current Barriers:   Chronic Disease Management support, education, and care coordination needs related to Hypertension, Hyperlipidemia, Heart Failure, and Insomnia   Hypertension BP Readings from Last 3 Encounters:  03/20/20 136/74  12/18/19 (!) 118/50  09/18/19 120/64    Pharmacist Clinical Goal(s): o Over the next 180 days, patient will work with PharmD and providers to maintain BP goal <140/90  Current regimen:  o Losartan 50mg   Interventions: o Reviewed most recent home BP readings  Patient self care activities - Over the next 180 days, patient will: o Check BP daily, document, and provide at future appointments o Ensure daily salt intake < 2300 mg/day  Hyperlipidemia Lab Results  Component Value Date/Time   LDLCALC 74 09/11/2019 10:07 AM   LDLCALC 90 04/01/2018 10:48 AM    Pharmacist Clinical Goal(s): o Over the next 180 days, patient will work with PharmD and providers to achieve LDL goal < 70  Current regimen:  o Rosuvastatin 40mg   Interventions: o Reviewed most recent lipid panel o Discussed limiting sweets at bedtime  Patient self care activities - Over the next 180 days, patient will: o Continue to take medication as directed o Work on cutting back on night time sweets such as ice cream  HF  Pharmacist Clinical Goal(s) o Over the next 180 days, patient will work with PharmD and providers to optimize medication and minimize symptoms of HF  Current regimen:  o Furosemide 20mg   Interventions: o Counseled on daily weight monitoring o Reviewed current fluid status  Patient  self care activities - Over the next 180 days, patient will: o Notify providers of 3 lb weight gain in one day or 5 lbs in one week Insomnia  Pharmacist Clinical Goal(s) o Over the next 180 days, patient will work with PharmD and providers to optimize medication or minimize symptoms of insomnia.  Current regimen:  o Lunesta 1mg  daily  Interventions: o Reviewed current sleep patterns o Recommended re trial of Trazodone  Patient self care activities - Over the next 180 days, patient will: o Discuss Trazodone with PCP at upcoming OV o Contact providers if sleep issues continue  Initial goal documentation        Kelli Roy was given information about Chronic Care Management services today including:  1. CCM service includes personalized support from designated clinical staff supervised by her physician, including individualized plan of care and coordination with other care providers 2. 24/7 contact phone numbers for assistance for urgent and routine care needs. 3. Standard insurance, coinsurance, copays and deductibles apply for chronic care management only during months in which we provide at least 20 minutes of these services. Most insurances cover these services at 100%, however patients may be responsible for any copay, coinsurance and/or deductible if applicable. This service may help you avoid the need for more expensive face-to-face services. 4. Only one practitioner may furnish and bill the service in a calendar month. 5. The patient may stop CCM services at any time (effective at the end of the month)  by phone call to the office staff.  Patient agreed to services and verbal consent obtained.   The patient verbalized understanding of instructions provided today and agreed to receive a mailed copy of patient instruction and/or educational materials. Telephone follow up appointment with pharmacy team member scheduled for: 6 months  Beverly Milch, PharmD Clinical  Pharmacist [facility] [phone]   Heart Failure, Self Care Heart failure is a serious condition. This sheet explains things you need to do to take care of yourself at home. To help you stay as healthy as possible, you may be asked to change your diet, take certain medicines, and make other changes in your life. Your doctor may also give you more specific instructions. If you have problems or questions, call your doctor. What are the risks? Having heart failure makes it more likely for you to have some problems. These problems can get worse if you do not take good care of yourself. Problems may include:  Blood clotting problems. This may cause a stroke.  Damage to the kidneys, liver, or lungs.  Abnormal heart rhythms. Supplies needed:  Scale for weighing yourself.  Blood pressure monitor.  Notebook.  Medicines. How to care for yourself when you have heart failure Medicines Take over-the-counter and prescription medicines only as told by your doctor. Take your medicines every day.  Do not stop taking your medicine unless your doctor tells you to do so.  Do not skip any medicines.  Get your prescriptions refilled before you run out of medicine. This is important. Eating and drinking   Eat heart-healthy foods. Talk with a diet specialist (dietitian) to create an eating plan.  Choose foods that: ? Have no trans fat. ? Are low in saturated fat and cholesterol.  Choose healthy foods, such as: ? Fresh or frozen fruits and vegetables. ? Fish. ? Low-fat (lean) meats. ? Legumes, such as beans, peas, and lentils. ? Fat-free or low-fat dairy products. ? Whole-grain foods. ? High-fiber foods.  Limit salt (sodium) if told by your doctor. Ask your diet specialist to tell you which seasonings are healthy for your heart.  Cook in healthy ways instead of frying. Healthy ways of cooking include roasting, grilling, broiling, baking, poaching, steaming, and  stir-frying.  Limit how much fluid you drink, if told by your doctor. Alcohol use  Do not drink alcohol if: ? Your doctor tells you not to drink. ? Your heart was damaged by alcohol, or you have very bad heart failure. ? You are pregnant, may be pregnant, or are planning to become pregnant.  If you drink alcohol: ? Limit how much you use to:  0-1 drink a day for women.  0-2 drinks a day for men. ? Be aware of how much alcohol is in your drink. In the [place] one drink equals one 12 oz bottle of beer (355 mL), one 5 oz glass of wine (148 mL), or one 1 oz glass of hard liquor (44 mL). Lifestyle   Do not use any products that contain nicotine or tobacco, such as cigarettes, e-cigarettes, and chewing tobacco. If you need help quitting, ask your doctor. ? Do not use nicotine gum or patches before talking to your doctor.  Do not use illegal drugs.  Lose weight if told by your doctor.  Do physical activity if told by your doctor. Talk to your doctor before you begin an exercise if: ? You are an older adult. ? You have very bad heart failure.  Learn  to manage stress. If you need help, ask your doctor.  Get rehab (rehabilitation) to help you stay independent and to help with your quality of life.  Plan time to rest when you get tired. Check weight and blood pressure   Weigh yourself every day. This will help you to know if fluid is building up in your body. ? Weigh yourself every morning after you pee (urinate) and before you eat breakfast. ? Wear the same amount of clothing each time. ? Write down your daily weight. Give your record to your doctor.  Check and write down your blood pressure as told by your doctor.  Check your pulse as told by your doctor. Dealing with very hot and very cold weather  If it is very hot: ? Avoid activities that take a lot of energy. ? Use air conditioning or fans, or find a cooler place. ? Avoid caffeine and alcohol. ? Wear clothing that is  loose-fitting, lightweight, and light-colored.  If it is very cold: ? Avoid activities that take a lot of energy. ? Layer your clothes. ? Wear mittens or gloves, a hat, and a scarf when you go outside. ? Avoid alcohol. Follow these instructions at home:  Stay up to date with shots (vaccines). Get pneumococcal and flu (influenza) shots.  Keep all follow-up visits as told by your doctor. This is important. Contact a doctor if:  You gain weight quickly.  You have increasing shortness of breath.  You cannot do your normal activities.  You get tired easily.  You cough a lot.  You don't feel like eating or feel like you may vomit (nauseous).  You become puffy (swell) in your hands, feet, ankles, or belly (abdomen).  You cannot sleep well because it is hard to breathe.  You feel like your heart is beating fast (palpitations).  You get dizzy when you stand up. Get help right away if:  You have trouble breathing.  You or someone else notices a change in your behavior, such as having trouble staying awake.  You have chest pain or discomfort.  You pass out (faint). These symptoms may be an emergency. Do not wait to see if the symptoms will go away. Get medical help right away. Call your local emergency services (911 in the [place] Do not drive yourself to the hospital. Summary  Heart failure is a serious condition. To care for yourself, you may have to change your diet, take medicines, and make other lifestyle changes.  Take your medicines every day. Do not stop taking them unless your doctor tells you to do so.  Eat heart-healthy foods, such as fresh or frozen fruits and vegetables, fish, lean meats, legumes, fat-free or low-fat dairy products, and whole-grain or high-fiber foods.  Ask your doctor if you can drink alcohol. You may have to stop alcohol use if you have very bad heart failure.  Contact your doctor if you gain weight quickly or feel that your heart is beating  too fast. Get help right away if you pass out, or have chest pain or trouble breathing. This information is not intended to replace advice given to you by your health care provider. Make sure you discuss any questions you have with your health care provider. Document Revised: 10/04/2018 Document Reviewed: 10/05/2018 Elsevier Patient Education  [place].

## 2020-04-19 NOTE — Patient Instructions (Addendum)
FU 6 months Schedule mammogram  Cut down on stool softener to 1 capsule twice a day

## 2020-04-19 NOTE — Progress Notes (Signed)
Subjective:   Patient presents for Medicare Annual/Subsequent preventive examination.  Pt here to f/u chronic medical problems.     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 No Over the past two weeks, have you felt little interest or pleasure in doing things? No Have you lost interest or pleasure in daily life? No Do you often feel hopeless? No Do you cry easily over simple problems? No   Activities of Daily Living  In your present state of health, do you have any difficulty performing the following activities?:  Driving? No  Managing money? No  Feeding yourself? No  Getting from bed to chair? No  Climbing a flight of stairs? yes  Preparing food and eating?: No  Bathing or showering? No  Getting dressed: No  Getting to the toilet? No  Using the toilet:No  Moving around from place to place: No  In the past year have you fallen or had a near fall?:No    Hearing  Difficulties: No  Do you often ask people to speak up or repeat themselves? No  Do you experience ringing or noises in your ears? No Do you have difficulty understanding soft or whispered voices? No  Do you feel that you have a problem with memory? No Do you often misplace items? No  Do you feel safe at home? Yes  Cognitive Testing  Alert? Yes Normal Appearance?Yes  Oriented to person? Yes Place? Yes  Time? Yes  Recall of three objects? Yes  Can perform simple calculations? Yes  Displays appropriate judgment?Yes  Can read the correct time from a watch face?Yes   List the Names of Other Physician/Practitioners you currently use:  Cardiology  Care Management- Pharmacy Optometry Dr Jorja Loa- has apt scheduled  ENT- Dr. Benjamine Mola  Dentist GYN  Screening Tests / Date Colonoscopy up-to-date                   Shingles vaccine- DUE  Mammogram up-to-date Bone Density UTD COVID-19 -   Influenza Vaccine dONE TODAY Tetanus/tdap up-to-date  Review Of S ystems:  GEN- denies fatigue, fever, weight loss,weakness, recent illness HEENT- denies eye drainage, change in vision, nasal discharge, CVS- denies chest pain, palpitations RESP- denies SOB, cough, wheeze ABD- denies N/V, change in stools, abd pain GU- denies dysuria, hematuria, dribbling, incontinence MSK- + joint pain, muscle aches, injury Neuro-  denies headache, dizziness, syncope, seizure activity  BP 136/68   Pulse 60   Temp 97.9 F (36.6 C) (Oral)   Resp 14   Ht 5\' 5"  (1.651 m)   Wt 155 lb (70.3 kg)   SpO2 96%   BMI 25.79 kg/m  GEN- NAD, alert and oriented x3 HEENT- PERRL, EOMI, non injected sclera, pink conjunctiva, MMM, oropharynx clear, TM clear bilat no effusion  Neck- Supple, no thyromegaly CVS- RRR, no murmur RESP-CTAB ABD-NABS,soft,NT,ND EXT- No edema Pulses- Radial, DP- 2+     Assessment:    Annual wellness medicare exam   Plan:    During the course of the visit the patient was educated and  counseled about appropriate screening and preventive services including:  Screening mammography - pt to schedule Fall/screen/depression screen negative     Osteopenia-continue  calcium and vitamin D Bone Density UTD   CAD/hypertension-reviewed recent labs as well as cardiology visit her Crestor is been increased.  She has noticed myalgias with the higher crestor dose, will check CK level   Given information on shingles vaccine she will decide if she would like to proceed with this vaccination.  Shot was given today.  Constipation- she has had looser bowels most days, recommend she cut down on stool softer  Chronic joint pain, no change to meds, topical NSAIDS   Chronic insomnia- prn lunesta  Due for repeat MRI of pancreas cyst          Pt has  POA/ FULL CODE        Diet review for nutrition referral? Yes ____ Not Indicated __x__  Patient Instructions (the written plan) was given to the patient.  Medicare Attestation  I have personally reviewed:  The patient's medical and social history  Their use of alcohol, tobacco or illicit drugs  Their current medications and supplements  The patient's functional ability including ADLs,fall risks, home safety risks, cognitive, and hearing and visual impairment  Diet and physical activities  Evidence for depression or mood disorders  The patient's weight, height, BMI, and visual acuity have been recorded in the chart. I have made referrals, counseling, and provided education to the patient based on review of the above and I have provided the patient with a written personalized care plan for preventive services.

## 2020-04-20 LAB — CBC WITH DIFFERENTIAL/PLATELET
Absolute Monocytes: 528 cells/uL (ref 200–950)
Basophils Absolute: 17 cells/uL (ref 0–200)
Basophils Relative: 0.3 %
Eosinophils Absolute: 151 cells/uL (ref 15–500)
Eosinophils Relative: 2.6 %
HCT: 42.6 % (ref 35.0–45.0)
Hemoglobin: 13.9 g/dL (ref 11.7–15.5)
Lymphs Abs: 1496 cells/uL (ref 850–3900)
MCH: 30.7 pg (ref 27.0–33.0)
MCHC: 32.6 g/dL (ref 32.0–36.0)
MCV: 94 fL (ref 80.0–100.0)
MPV: 11.3 fL (ref 7.5–12.5)
Monocytes Relative: 9.1 %
Neutro Abs: 3608 cells/uL (ref 1500–7800)
Neutrophils Relative %: 62.2 %
Platelets: 169 10*3/uL (ref 140–400)
RBC: 4.53 10*6/uL (ref 3.80–5.10)
RDW: 12.1 % (ref 11.0–15.0)
Total Lymphocyte: 25.8 %
WBC: 5.8 10*3/uL (ref 3.8–10.8)

## 2020-04-20 LAB — COMPREHENSIVE METABOLIC PANEL
AG Ratio: 1.8 (calc) (ref 1.0–2.5)
ALT: 11 U/L (ref 6–29)
AST: 17 U/L (ref 10–35)
Albumin: 4.2 g/dL (ref 3.6–5.1)
Alkaline phosphatase (APISO): 101 U/L (ref 37–153)
BUN: 14 mg/dL (ref 7–25)
CO2: 28 mmol/L (ref 20–32)
Calcium: 10.2 mg/dL (ref 8.6–10.4)
Chloride: 104 mmol/L (ref 98–110)
Creat: 0.87 mg/dL (ref 0.60–0.93)
Globulin: 2.4 g/dL (calc) (ref 1.9–3.7)
Glucose, Bld: 93 mg/dL (ref 65–99)
Potassium: 4.9 mmol/L (ref 3.5–5.3)
Sodium: 140 mmol/L (ref 135–146)
Total Bilirubin: 0.5 mg/dL (ref 0.2–1.2)
Total Protein: 6.6 g/dL (ref 6.1–8.1)

## 2020-04-20 LAB — LIPID PANEL
Cholesterol: 154 mg/dL (ref <200)
HDL: 66 mg/dL (ref >=50)
LDL Cholesterol (Calc): 69 mg/dL (calc)
Non-HDL Cholesterol (Calc): 88 mg/dL (calc) (ref <130)
Total CHOL/HDL Ratio: 2.3 (calc) (ref <5.0)
Triglycerides: 103 mg/dL (ref <150)

## 2020-04-20 LAB — CK: Total CK: 55 U/L (ref 29–143)

## 2020-04-21 ENCOUNTER — Encounter: Payer: Self-pay | Admitting: Family Medicine

## 2020-04-28 ENCOUNTER — Encounter: Payer: Self-pay | Admitting: Family Medicine

## 2020-04-29 ENCOUNTER — Ambulatory Visit (INDEPENDENT_AMBULATORY_CARE_PROVIDER_SITE_OTHER): Payer: PPO | Admitting: Family Medicine

## 2020-04-29 ENCOUNTER — Encounter: Payer: Self-pay | Admitting: Family Medicine

## 2020-04-29 ENCOUNTER — Other Ambulatory Visit: Payer: Self-pay

## 2020-04-29 VITALS — BP 122/58 | HR 71 | Temp 98.5°F | Ht 65.0 in | Wt 150.2 lb

## 2020-04-29 DIAGNOSIS — N390 Urinary tract infection, site not specified: Secondary | ICD-10-CM

## 2020-04-29 DIAGNOSIS — R300 Dysuria: Secondary | ICD-10-CM | POA: Diagnosis not present

## 2020-04-29 LAB — URINALYSIS, ROUTINE W REFLEX MICROSCOPIC
Bilirubin Urine: NEGATIVE
Hyaline Cast: NONE SEEN /LPF
Specific Gravity, Urine: 1.018 (ref 1.001–1.03)
WBC, UA: >=60 /HPF — AB (ref 0–5)

## 2020-04-29 MED ORDER — NITROFURANTOIN MONOHYD MACRO 100 MG PO CAPS: 100.0000 mg | ORAL_CAPSULE | Freq: Two times a day (BID) | ORAL | 0 refills | Status: DC

## 2020-04-29 NOTE — Progress Notes (Signed)
   Subjective:    Patient ID: Kelli Roy, female    DOB: 1945/11/03, 74 y.o.   MRN: [id number]  Patient presents for Dysuria (Pt stated that she has been experiencing some discomfort and urgency  to use the restroom for the past 3 days)  Pt here with recurrent dysuria UTI symptoms  She was treated for UTI last in Sept with Keflex QID x 5  days  Sat started having burning and frequency 2 days ago, very severe on Suday  She took AZO No fever    Review Of Systems:  GEN- denies fatigue, fever, weight loss,weakness, recent illness HEENT- denies eye drainage, change in vision, nasal discharge, CVS- denies chest pain, palpitations RESP- denies SOB, cough, wheeze ABD- denies N/V, change in stools, abd pain GU- +dysuria, hematuria, dribbling, incontinence Neuro- denies headache, dizziness, syncope, seizure activity       Objective:    BP (!) 122/58   Pulse 71   Temp 98.5 F (36.9 C) (Oral)   Ht 5\' 5"  (1.651 m)   Wt 150 lb 3.2 oz (68.1 kg)   SpO2 95%   BMI 24.99 kg/m  GEN- NAD, alert and oriented x3 HEENT- PERRL, EOMI, non injected sclera,  CVS- RRR, no murmur RESP-CTAB ABD-NABS,soft,NT,ND, no CVA tenderness EXT- No edema Pulses- Radial 2+        Assessment & Plan:      Problem List Items Addressed This Visit    None    Visit Diagnoses    Urinary tract infection without hematuria, site unspecified    -  Primary   Culture sent, difficult to interpret as AZO taken. based on history and symptoms start macrobid 100mg  BID, push fluids, add cranberry   Relevant Medications   nitrofurantoin, macrocrystal-monohydrate, (MACROBID) 100 MG capsule   Other Relevant Orders   Urinalysis, Routine w reflex microscopic (Completed)   Urine Culture      Note: This dictation was prepared with Dragon dictation along with smaller phrase technology. Any transcriptional errors that result from this process are unintentional.

## 2020-04-29 NOTE — Patient Instructions (Signed)
F/U as needed

## 2020-05-01 ENCOUNTER — Ambulatory Visit (HOSPITAL_COMMUNITY)
Admission: RE | Admit: 2020-05-01 | Discharge: 2020-05-01 | Disposition: A | Discharge status: Home / Self Care | Payer: PPO | Source: Ambulatory Visit | Attending: Family Medicine | Admitting: Family Medicine

## 2020-05-01 ENCOUNTER — Other Ambulatory Visit: Payer: Self-pay | Admitting: Family Medicine

## 2020-05-01 ENCOUNTER — Other Ambulatory Visit: Payer: Self-pay | Admitting: Cardiology

## 2020-05-01 ENCOUNTER — Other Ambulatory Visit: Payer: Self-pay

## 2020-05-01 DIAGNOSIS — K862 Cyst of pancreas: Secondary | ICD-10-CM | POA: Diagnosis not present

## 2020-05-01 DIAGNOSIS — R935 Abnormal findings on diagnostic imaging of other abdominal regions, including retroperitoneum: Secondary | ICD-10-CM | POA: Diagnosis not present

## 2020-05-01 DIAGNOSIS — K8689 Other specified diseases of pancreas: Secondary | ICD-10-CM | POA: Diagnosis not present

## 2020-05-01 DIAGNOSIS — D136 Benign neoplasm of pancreas: Secondary | ICD-10-CM

## 2020-05-01 LAB — URINE CULTURE
MICRO NUMBER:: 11113777
SPECIMEN QUALITY:: ADEQUATE

## 2020-05-01 MED ORDER — GADOBUTROL 1 MMOL/ML IV SOLN
7.0000 mL | Freq: Once | INTRAVENOUS | Status: AC | PRN
  Administered 2020-05-01: 7 mL via INTRAVENOUS

## 2020-05-06 ENCOUNTER — Telehealth: Payer: Self-pay | Admitting: Family Medicine

## 2020-05-06 DIAGNOSIS — D490 Neoplasm of unspecified behavior of digestive system: Secondary | ICD-10-CM | POA: Diagnosis not present

## 2020-05-06 NOTE — Telephone Encounter (Signed)
Dr.[place] referred pt to   [facility]  calling need office notes fax over please any question call back # [phone]

## 2020-05-07 ENCOUNTER — Other Ambulatory Visit: Payer: Self-pay | Admitting: Cardiology

## 2020-05-07 ENCOUNTER — Encounter: Payer: Self-pay | Admitting: Family Medicine

## 2020-05-07 MED ORDER — ROSUVASTATIN CALCIUM 40 MG PO TABS: 40.0000 mg | ORAL_TABLET | Freq: Every day | ORAL | 3 refills | Status: AC

## 2020-05-07 NOTE — Telephone Encounter (Signed)
I have faxed office notes and imaging studies to [facility] release # [id number]

## 2020-05-10 ENCOUNTER — Telehealth: Payer: Self-pay | Admitting: Gastroenterology

## 2020-05-10 NOTE — Telephone Encounter (Signed)
Hi Dr. Rush Landmark, we have received a referral from [facility] for EUS. Records will be sent to you for review. Please advise on scheduling. Thank you.

## 2020-05-11 NOTE — Telephone Encounter (Signed)
Patty, I have reviewed the imaging as well as the clinic note from Dr. Zenia Resides. Please move forward with scheduling this patient for an endoscopic ultrasound with fine-needle aspiration, on my next available date. Please let us know what that date is. Thank you. GM

## 2020-05-13 NOTE — Telephone Encounter (Signed)
Dr Rush Landmark what is the reason for the EUS?

## 2020-05-13 NOTE — Telephone Encounter (Signed)
Pancreatic Cyst/Pancreatic Mass. Thanks. GM

## 2020-05-15 ENCOUNTER — Other Ambulatory Visit: Payer: Self-pay

## 2020-05-15 DIAGNOSIS — K862 Cyst of pancreas: Secondary | ICD-10-CM

## 2020-05-15 NOTE — Telephone Encounter (Signed)
EUS scheduled at 12/29 at 845 am with Dr Rush Landmark  COVID test on 12/27

## 2020-05-15 NOTE — Telephone Encounter (Signed)
EUS scheduled, pt instructed and medications reviewed.  Patient instructions mailed to home.  Patient to call with any questions or concerns.  

## 2020-05-23 ENCOUNTER — Encounter: Payer: Self-pay | Admitting: Gastroenterology

## 2020-05-23 NOTE — Progress Notes (Signed)
Review of outside records to be scanned into the chart  November 2021 [facility] surgery note History visual is in February 20 VC and subsequent MRIs to be performed showing a 2.1 cm cyst.  Repeat MRI in October 2021 showed a large mass centered 0.3 cm.  She has a brother was diagnosed with pancreas cancer at the age of 84 complicating the patient's history.  Patient referred for evaluation via EUS for sampling of the cyst to rule out malignancy patient was planning to have a CEA lab and a CA 19-9 lab performed. Seen by Dr. Zenia Resides.  Note will be scanned into the chart.

## 2020-05-24 DIAGNOSIS — Z1231 Encounter for screening mammogram for malignant neoplasm of breast: Secondary | ICD-10-CM | POA: Diagnosis not present

## 2020-05-24 LAB — HM MAMMOGRAPHY

## 2020-06-20 DIAGNOSIS — H353131 Nonexudative age-related macular degeneration, bilateral, early dry stage: Secondary | ICD-10-CM | POA: Diagnosis not present

## 2020-06-24 ENCOUNTER — Other Ambulatory Visit: Payer: Self-pay

## 2020-06-24 ENCOUNTER — Encounter (HOSPITAL_COMMUNITY): Payer: Self-pay | Admitting: Gastroenterology

## 2020-06-24 DIAGNOSIS — D225 Melanocytic nevi of trunk: Secondary | ICD-10-CM | POA: Diagnosis not present

## 2020-06-24 DIAGNOSIS — L821 Other seborrheic keratosis: Secondary | ICD-10-CM | POA: Diagnosis not present

## 2020-06-24 DIAGNOSIS — D485 Neoplasm of uncertain behavior of skin: Secondary | ICD-10-CM | POA: Diagnosis not present

## 2020-06-24 DIAGNOSIS — Z86018 Personal history of other benign neoplasm: Secondary | ICD-10-CM | POA: Diagnosis not present

## 2020-06-24 DIAGNOSIS — L570 Actinic keratosis: Secondary | ICD-10-CM | POA: Diagnosis not present

## 2020-06-24 DIAGNOSIS — L578 Other skin changes due to chronic exposure to nonionizing radiation: Secondary | ICD-10-CM | POA: Diagnosis not present

## 2020-06-24 DIAGNOSIS — C44519 Basal cell carcinoma of skin of other part of trunk: Secondary | ICD-10-CM | POA: Diagnosis not present

## 2020-07-01 ENCOUNTER — Other Ambulatory Visit (HOSPITAL_COMMUNITY)
Admission: RE | Admit: 2020-07-01 | Discharge: 2020-07-01 | Disposition: A | Discharge status: Home / Self Care | Payer: PPO | Source: Ambulatory Visit | Attending: Gastroenterology | Admitting: Gastroenterology

## 2020-07-01 DIAGNOSIS — Z01812 Encounter for preprocedural laboratory examination: Secondary | ICD-10-CM | POA: Insufficient documentation

## 2020-07-01 DIAGNOSIS — Z20822 Contact with and (suspected) exposure to covid-19: Secondary | ICD-10-CM | POA: Insufficient documentation

## 2020-07-01 LAB — SARS CORONAVIRUS 2 (TAT 6-24 HRS): SARS Coronavirus 2: NEGATIVE

## 2020-07-02 NOTE — Anesthesia Preprocedure Evaluation (Addendum)
Anesthesia Evaluation  Patient identified by MRN, date of birth, ID band Patient awake    Reviewed: Allergy & Precautions, NPO status , Patient's Chart, lab work & pertinent test results  Airway Mallampati: II  TM Distance: >3 FB Neck ROM: Full    Dental no notable dental hx. (+) Teeth Intact, Missing, Dental Advisory Given,    Pulmonary neg pulmonary ROS,    Pulmonary exam normal breath sounds clear to auscultation       Cardiovascular hypertension, Pt. on medications + CAD, + Past MI, + CABG and +CHF  Normal cardiovascular exam Rhythm:Regular Rate:Normal  03/2014 Echo: EF 25-30%; Pre op TEE 05/2014 with EF of 50%  Stress Test 2019 -  Large size, severe severity fixed distal anteroseptal, anterior, apical, inferoapical and apical lateral perfusion defect suggestive of scar without significant reversible ischemia (SDS 1). LVEF 49% with distal anterior, anteroseptal and apical akinesis. This is an intermediate risk study. No change compared to prior study in 2017.   Neuro/Psych  Headaches, negative psych ROS   GI/Hepatic negative GI ROS, Neg liver ROS,   Endo/Other  negative endocrine ROS  Renal/GU negative Renal ROS  negative genitourinary   Musculoskeletal  (+) Arthritis , Osteoarthritis,    Abdominal   Peds negative pediatric ROS (+)  Hematology negative hematology ROS (+)   Anesthesia Other Findings   Reproductive/Obstetrics negative OB ROS                           Anesthesia Physical  Anesthesia Plan  ASA: III  Anesthesia Plan: MAC   Post-op Pain Management:    Induction: Intravenous  PONV Risk Score and Plan: 2  Airway Management Planned: Natural Airway, Nasal Cannula, Simple Face Mask and Mask  Additional Equipment:   Intra-op Plan:   Post-operative Plan:   Informed Consent: I have reviewed the patients History and Physical, chart, labs and discussed the procedure  including the risks, benefits and alternatives for the proposed anesthesia with the patient or authorized representative who has indicated his/her understanding and acceptance.       Plan Discussed with: CRNA and Anesthesiologist  Anesthesia Plan Comments:        Anesthesia Quick Evaluation

## 2020-07-03 ENCOUNTER — Ambulatory Visit (HOSPITAL_COMMUNITY): Payer: PPO | Admitting: Anesthesiology

## 2020-07-03 ENCOUNTER — Ambulatory Visit (HOSPITAL_COMMUNITY)
Admission: RE | Admit: 2020-07-03 | Discharge: 2020-07-03 | Disposition: A | Discharge status: Home / Self Care | Payer: PPO | Attending: Gastroenterology | Admitting: Gastroenterology

## 2020-07-03 ENCOUNTER — Encounter (HOSPITAL_COMMUNITY): Payer: Self-pay | Admitting: Gastroenterology

## 2020-07-03 ENCOUNTER — Encounter (HOSPITAL_COMMUNITY)
Admission: RE | Disposition: A | Discharge status: Home / Self Care | Payer: Self-pay | Source: Home / Self Care | Attending: Gastroenterology

## 2020-07-03 ENCOUNTER — Other Ambulatory Visit: Payer: Self-pay

## 2020-07-03 ENCOUNTER — Other Ambulatory Visit: Payer: Self-pay | Admitting: Gastroenterology

## 2020-07-03 DIAGNOSIS — Z885 Allergy status to narcotic agent status: Secondary | ICD-10-CM | POA: Diagnosis not present

## 2020-07-03 DIAGNOSIS — Z882 Allergy status to sulfonamides status: Secondary | ICD-10-CM | POA: Insufficient documentation

## 2020-07-03 DIAGNOSIS — K862 Cyst of pancreas: Secondary | ICD-10-CM

## 2020-07-03 DIAGNOSIS — K2980 Duodenitis without bleeding: Secondary | ICD-10-CM | POA: Diagnosis not present

## 2020-07-03 DIAGNOSIS — R970 Elevated carcinoembryonic antigen [CEA]: Secondary | ICD-10-CM | POA: Diagnosis not present

## 2020-07-03 DIAGNOSIS — K449 Diaphragmatic hernia without obstruction or gangrene: Secondary | ICD-10-CM | POA: Diagnosis not present

## 2020-07-03 DIAGNOSIS — K2970 Gastritis, unspecified, without bleeding: Secondary | ICD-10-CM | POA: Diagnosis not present

## 2020-07-03 DIAGNOSIS — K259 Gastric ulcer, unspecified as acute or chronic, without hemorrhage or perforation: Secondary | ICD-10-CM | POA: Diagnosis not present

## 2020-07-03 DIAGNOSIS — E785 Hyperlipidemia, unspecified: Secondary | ICD-10-CM | POA: Diagnosis not present

## 2020-07-03 DIAGNOSIS — Z888 Allergy status to other drugs, medicaments and biological substances status: Secondary | ICD-10-CM | POA: Insufficient documentation

## 2020-07-03 DIAGNOSIS — Z800 Family history of malignant neoplasm of digestive organs: Secondary | ICD-10-CM | POA: Insufficient documentation

## 2020-07-03 DIAGNOSIS — G4700 Insomnia, unspecified: Secondary | ICD-10-CM | POA: Diagnosis not present

## 2020-07-03 DIAGNOSIS — K3189 Other diseases of stomach and duodenum: Secondary | ICD-10-CM | POA: Diagnosis not present

## 2020-07-03 DIAGNOSIS — K8689 Other specified diseases of pancreas: Secondary | ICD-10-CM | POA: Diagnosis not present

## 2020-07-03 HISTORY — PX: BIOPSY: SHX5522

## 2020-07-03 HISTORY — PX: ESOPHAGOGASTRODUODENOSCOPY (EGD) WITH PROPOFOL: SHX5813

## 2020-07-03 HISTORY — PX: EUS: SHX5427

## 2020-07-03 HISTORY — PX: FINE NEEDLE ASPIRATION: SHX6590

## 2020-07-03 SURGERY — UPPER ENDOSCOPIC ULTRASOUND (EUS) RADIAL
Anesthesia: Monitor Anesthesia Care

## 2020-07-03 MED ORDER — SODIUM CHLORIDE 0.9 % IV SOLN: INTRAVENOUS | Status: DC

## 2020-07-03 MED ORDER — CIPROFLOXACIN HCL 500 MG PO TABS: 500.0000 mg | ORAL_TABLET | Freq: Two times a day (BID) | ORAL | 0 refills | Status: AC

## 2020-07-03 MED ORDER — CIPROFLOXACIN IN D5W 400 MG/200ML IV SOLN
INTRAVENOUS | Status: DC | PRN
  Administered 2020-07-03: 400 mg via INTRAVENOUS

## 2020-07-03 MED ORDER — PROPOFOL 500 MG/50ML IV EMUL
INTRAVENOUS | Status: AC
  Filled 2020-07-03: qty 50

## 2020-07-03 MED ORDER — PROPOFOL 1000 MG/100ML IV EMUL
INTRAVENOUS | Status: AC
  Filled 2020-07-03: qty 200

## 2020-07-03 MED ORDER — PROPOFOL 10 MG/ML IV BOLUS
INTRAVENOUS | Status: AC
  Filled 2020-07-03: qty 20

## 2020-07-03 MED ORDER — LACTATED RINGERS IV SOLN: INTRAVENOUS | Status: DC | PRN

## 2020-07-03 MED ORDER — LACTATED RINGERS IV SOLN: INTRAVENOUS | Status: DC

## 2020-07-03 MED ORDER — FAMOTIDINE 20 MG PO TABS: 20.0000 mg | ORAL_TABLET | Freq: Two times a day (BID) | ORAL | 6 refills | Status: DC

## 2020-07-03 MED ORDER — CIPROFLOXACIN IN D5W 400 MG/200ML IV SOLN
INTRAVENOUS | Status: AC
  Filled 2020-07-03: qty 200

## 2020-07-03 MED ORDER — CIPROFLOXACIN HCL 500 MG PO TABS
500.0000 mg | ORAL_TABLET | Freq: Two times a day (BID) | ORAL | Status: DC
  Filled 2020-07-03: qty 1

## 2020-07-03 MED ORDER — PROPOFOL 500 MG/50ML IV EMUL
INTRAVENOUS | Status: DC | PRN
  Administered 2020-07-03: 50 mg via INTRAVENOUS
  Administered 2020-07-03: 50 ug/kg/min via INTRAVENOUS
  Administered 2020-07-03: 100 ug/kg/min via INTRAVENOUS

## 2020-07-03 NOTE — Anesthesia Postprocedure Evaluation (Signed)
Anesthesia Post Note  Patient: Kelli Roy  Procedure(s) Performed: UPPER ENDOSCOPIC ULTRASOUND (EUS) RADIAL (N/A ) BIOPSY FINE NEEDLE ASPIRATION     Patient location during evaluation: PACU Anesthesia Type: MAC Level of consciousness: awake and alert Pain management: pain level controlled Vital Signs Assessment: post-procedure vital signs reviewed and stable Respiratory status: spontaneous breathing, nonlabored ventilation, respiratory function stable and patient connected to nasal cannula oxygen Cardiovascular status: stable and blood pressure returned to baseline Postop Assessment: no apparent nausea or vomiting Anesthetic complications: no   No complications documented.  Last Vitals:  Vitals:   07/03/20 0850 07/03/20 0900  BP: (!) 152/43 (!) 141/53  Pulse: 62 (!) 54  Resp: 19 20  Temp:    SpO2: 97% 98%    Last Pain:  Vitals:   07/03/20 0900  TempSrc:   PainSc: 0-No pain                 Latoyna Hird

## 2020-07-03 NOTE — Discharge Instructions (Signed)
YOU HAD AN ENDOSCOPIC PROCEDURE TODAY: Refer to the procedure report and other information in the discharge instructions given to you for any specific questions about what was found during the examination. If this information does not answer your questions, please call [place] office at [phone] to clarify.   YOU SHOULD EXPECT: Some feelings of bloating in the abdomen. Passage of more gas than usual. Walking can help get rid of the air that was put into your GI tract during the procedure and reduce the bloating. If you had a lower endoscopy (such as a colonoscopy or flexible sigmoidoscopy) you may notice spotting of blood in your stool or on the toilet paper. Some abdominal soreness may be present for a day or two, also.  DIET: Your first meal following the procedure should be a light meal and then it is ok to progress to your normal diet. A half-sandwich or bowl of soup is an example of a good first meal. Heavy or fried foods are harder to digest and may make you feel nauseous or bloated. Drink plenty of fluids but you should avoid alcoholic beverages for 24 hours. If you had a esophageal dilation, please see attached instructions for diet.    ACTIVITY: Your care partner should take you home directly after the procedure. You should plan to take it easy, moving slowly for the rest of the day. You can resume normal activity the day after the procedure however YOU SHOULD NOT DRIVE, use power tools, machinery or perform tasks that involve climbing or major physical exertion for 24 hours (because of the sedation medicines used during the test).   SYMPTOMS TO REPORT IMMEDIATELY: A gastroenterologist can be reached at any hour. Please call [phone]  for any of the following symptoms:   Following upper endoscopy (EGD, EUS, ERCP, esophageal dilation) Vomiting of blood or coffee ground material  New, significant abdominal pain  New, significant chest pain or pain under the shoulder blades  Painful or  persistently difficult swallowing  New shortness of breath  Black, tarry-looking or red, bloody stools  FOLLOW UP:  If any biopsies were taken you will be contacted by phone or by letter within the next 1-3 weeks. Call [phone]  if you have not heard about the biopsies in 3 weeks.  Please also call with any specific questions about appointments or follow up tests.  

## 2020-07-03 NOTE — Transfer of Care (Signed)
Immediate Anesthesia Transfer of Care Note  Patient: Kelli Roy  Procedure(s) Performed: Procedure(s): UPPER ENDOSCOPIC ULTRASOUND (EUS) RADIAL (N/A) BIOPSY FINE NEEDLE ASPIRATION  Patient Location: PACU and Endoscopy Unit  Anesthesia Type:MAC  Level of Consciousness: awake, alert  and oriented  Airway & Oxygen Therapy: Patient Spontanous Breathing and Patient connected to nasal cannula oxygen  Post-op Assessment: Report given to RN and Post -op Vital signs reviewed and stable  Post vital signs: Reviewed and stable  Last Vitals:  Vitals:   07/03/20 0648  BP: (!) 193/76  Pulse: 64  Resp: 14  Temp: 36.8 C  SpO2: 53%    Complications: No apparent anesthesia complications

## 2020-07-03 NOTE — H&P (Signed)
GASTROENTEROLOGY PROCEDURE H&P NOTE   Primary Care Physician: Alycia Rossetti, MD  HPI: Kelli Roy is a 74 y.o. 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  Past Medical History:  Diagnosis Date  . Allergy   . Alopecia   . Arthritis   . CAD (coronary artery disease)    a. 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 Cyndia Bent   . Cataract   . Chronic systolic CHF (congestive heart failure) ([place])    a. 03/2014 Echo: EF 25-30%; Pre op TEE 05/2014 with EF of 50%  . Family history of cardiovascular disease   . Family history of colon cancer   . History of long-term treatment with high-risk medication   . Hyperlipidemia   . Hypertension   . Insomnia   . Ischemic cardiomyopathy    a. 03/2014 Echo: EF 25-30%, mid-apical/anteroseptal and inf AK, Gr 2 DD, ? apical thrombus, Mild MR.-->Discharged with LifeVest.  . Malaise and fatigue   . Myocardial infarction ([place]) 03/2014  . Shingles 09/18/2019  . Shortness of breath dyspnea   . Sinus headache    Past Surgical History:  Procedure Laterality Date  . CARDIAC CATHETERIZATION  2001   showed no blockages  . CATARACT EXTRACTION W/PHACO Left 01/04/2014   Procedure: CATARACT EXTRACTION PHACO AND INTRAOCULAR LENS PLACEMENT (IOC);  Surgeon: Tonny Branch, MD;  Location: [facility];  Service: Ophthalmology;  Laterality: Left;  CDE:5.65  . CATARACT EXTRACTION W/PHACO Right 01/29/2014   Procedure: CATARACT EXTRACTION PHACO AND INTRAOCULAR LENS PLACEMENT RIGHT EYE CDE=4.49;  Surgeon: Tonny Branch, MD;  Location: [facility];  Service: Ophthalmology;  Laterality: Right;  . CERVICAL SPINE SURGERY  09/27/2003   C6-67 servical fusion  . COLONOSCOPY WITH PROPOFOL N/A 05/18/2016    Procedure: COLONOSCOPY WITH PROPOFOL;  Surgeon: Garlan Fair, MD;  Location: [facility];  Service: Endoscopy;  Laterality: N/A;  . CORONARY ARTERY BYPASS GRAFT N/A 05/21/2014   Procedure: CORONARY ARTERY BYPASS GRAFTING (CABG);  Surgeon: Gaye Pollack, MD;  Location: [place];  Service: Open Heart Surgery;  Laterality: N/A;  . CYSTECTOMY  1998   fibroid cysts  . DILATION AND CURETTAGE OF UTERUS  1976   2nd to miscarriage  . EYE SURGERY    . INTRAOPERATIVE TRANSESOPHAGEAL ECHOCARDIOGRAM N/A 05/21/2014   Procedure: INTRAOPERATIVE TRANSESOPHAGEAL ECHOCARDIOGRAM;  Surgeon: Gaye Pollack, MD;  Location: [facility] OR;  Service: Open Heart Surgery;  Laterality: N/A;  . LEFT HEART CATHETERIZATION WITH CORONARY ANGIOGRAM N/A 03/08/2014   Procedure: LEFT HEART CATHETERIZATION WITH CORONARY ANGIOGRAM;  Surgeon: Peter M Martinique, MD;  Location: [facility] CATH LAB;  Service: Cardiovascular;  Laterality: N/A;  . 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 PERCUTANEOUS CORONARY STENT INTERVENTION (PCI-S)  03/08/2014   Procedure: PERCUTANEOUS CORONARY STENT INTERVENTION (PCI-S);  Surgeon: Peter M Martinique, MD;  Location: [facility] CATH LAB;  Service: Cardiovascular;;  prox lad  bms   Current Facility-Administered Medications  Medication Dose Route Frequency Provider Last Rate Last Admin  . 0.9 %  sodium chloride infusion   Intravenous Continuous Mansouraty, Telford Nab., MD      . lactated ringers infusion  Intravenous Continuous Mansouraty, Telford Nab., MD 20 mL/hr at 07/03/20 0703 New Bag at 07/03/20 0703   Allergies  Allergen Reactions  . Carvedilol Shortness Of Breath and Other (See Comments)    Also caused fluid to accumulate around her heart; resulted in hospitalization  . Spironolactone Shortness Of Breath and Other (See Comments)    Also caused fluid to accumulate around her heart; resulted in hospitalization  . Demerol [Meperidine] Nausea Only  . Lipitor [Atorvastatin] Itching  .  Lisinopril Cough  . Dexilant [Dexlansoprazole] Rash  . Latex Rash  . Neomycin Rash  . Plavix [Clopidogrel Bisulfate] Rash  . Sulfonamide Derivatives Rash  . Ticagrelor Itching and Rash    Brilinta    Family History  Problem Relation Age of Onset  . Stroke Mother   . Heart attack Mother   . Heart disease Mother   . Peripheral vascular disease Father        had stent in leg  . Colon cancer Father   . Atrial fibrillation Father        with pacemaker  . Thyroid disease Father   . Cancer Father   . Hearing loss Father   . Hypertension Father   . Pancreatic cancer Brother   . Cancer Brother   . Hypertension Brother   . Coronary artery disease Brother        with Stent  . Cancer Brother   . Hypertension Brother   . Coronary artery disease Brother        with CABG in his 34's  . Hypertension Brother   . Thyroid disease Brother   . Hypertension Brother   . Thyroid disease Sister    Social History   Socioeconomic History  . Marital status: Married    Spouse name: Not on file  . Number of children: Not on file  . Years of education: Not on file  . Highest education level: Not on file  Occupational History  . Not on file  Tobacco Use  . Smoking status: Never Smoker  . Smokeless tobacco: Never Used  Vaping Use  . Vaping Use: Never used  Substance and Sexual Activity  . Alcohol use: Not Currently    Comment: rare glass of wine  . Drug use: No  . Sexual activity: Not Currently    Birth control/protection: Post-menopausal  Other Topics Concern  . Not on file  Social History Narrative  . Not on file   Social Determinants of Health   Financial Resource Strain: Low Risk   . Difficulty of Paying Living Expenses: Not very hard  Food Insecurity: Not on file  Transportation Needs: Not on file  Physical Activity: Not on file  Stress: Not on file  Social Connections: Not on file  Intimate Partner Violence: Not on file    Physical Exam: Vital signs in last 24  hours: Temp:  [98.2 F (36.8 C)] 98.2 F (36.8 C) (12/29 0648) Pulse Rate:  [64] 64 (12/29 0648) Resp:  [14] 14 (12/29 0648) BP: (193)/(76) 193/76 (12/29 0648) SpO2:  [98 %] 98 % (12/29 0648) Weight:  [68 kg] 68 kg (12/29 0654)   GEN: NAD EYE: Sclerae anicteric ENT: MMM CV: Non-tachycardic GI: Soft, NT/ND NEURO:  Alert & Oriented x 3  Lab Results: No results for input(s): WBC, HGB, HCT, PLT in the last 72 hours. BMET No results for input(s): NA, K, CL, CO2, GLUCOSE, BUN, CREATININE, CALCIUM in the last 72 hours. LFT No results for input(s): PROT, ALBUMIN, AST,  ALT, ALKPHOS, BILITOT, BILIDIR, IBILI in the last 72 hours. PT/INR No results for input(s): LABPROT, INR in the last 72 hours.   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  The risks of an EUS including intestinal perforation, bleeding, infection, aspiration, and medication effects were discussed as was the possibility it may not give a definitive diagnosis if a biopsy is performed.  When a biopsy of the pancreas is done as part of the EUS, there is an additional risk of pancreatitis at the rate of about 1-2%.  It was explained that procedure related pancreatitis is typically mild, although it can be severe and even life threatening, which is why we do not perform random pancreatic biopsies and only biopsy a lesion/area we feel is concerning enough to warrant the risk.  The risks and benefits of endoscopic evaluation were discussed with the patient; these include but are not limited to the risk of perforation, infection, bleeding, missed lesions, lack of diagnosis, severe illness requiring hospitalization, as well as anesthesia and sedation related illnesses.  The patient is agreeable to proceed.    Corliss Parish, MD [facility] Office # [phone]

## 2020-07-03 NOTE — Op Note (Signed)
[facility] Patient Name: Kelli Roy Procedure Date: 07/03/2020 MRN: [id number] Attending MD: Justice Britain , MD Date of Birth: 03-06-1946 CSN: [id number] Age: 74 Admit Type: Inpatient Procedure:                Upper EUS Indications:              Pancreatic cyst on MRCP, Family history of                            Pancreatic Cancer (brother) Providers:                Justice Britain, MD, Jeanella Cara, RN,                            Ladona Ridgel, Technician Referring MD:              Medicines:                Monitored Anesthesia Care, Cipro 597 mg IV Complications:            No immediate complications. Estimated Blood Loss:     Estimated blood loss was minimal. Procedure:                Pre-Anesthesia Assessment:                           - Prior to the procedure, a History and Physical                            was performed, and patient medications and                            allergies were reviewed. The patient's tolerance of                            previous anesthesia was also reviewed. The risks                            and benefits of the procedure and the sedation                            options and risks were discussed with the patient.                            All questions were answered, and informed consent                            was obtained. Prior Anticoagulants: The patient has                            taken no previous anticoagulant or antiplatelet                            agents except for aspirin. ASA Grade Assessment:  III - A patient with severe systemic disease. After                            reviewing the risks and benefits, the patient was                            deemed in satisfactory condition to undergo the                            procedure.                           After obtaining informed consent, the endoscope was                            passed under direct  vision. Throughout the                            procedure, the patient's blood pressure, pulse, and                            oxygen saturations were monitored continuously. The                            [id number] ([id number]) Olympus gastroscope was                            introduced through the mouth, and advanced to the                            second part of duodenum. The Olympus [id number]                            [id number]) was introduced through the mouth, and                            advanced to the second part of duodenum. The                            ([id number]) [id number] Linear EUS was introduced                            through the mouth, and advanced to the duodenum for                            ultrasound examination from the stomach and                            duodenum. The upper EUS was accomplished without                            difficulty. The patient tolerated the procedure. Scope In: Scope Out: Findings:      ENDOSCOPIC FINDING: :      No gross lesions  were noted in the entire esophagus. Z-line 40 cm.      1 cm hiatal hernia noted.      Patchy moderate inflammation characterized by erosions, erythema and       granularity was found in the entire examined stomach.      Few non-bleeding cratered gastric ulcers with a clean ulcer base       (Forrest Class III) were found in the stomach. The largest lesion was 12       mm in largest dimension.      No other gross lesions were noted in the entire examined stomach.       Biopsies were taken with a cold forceps for histology and Helicobacter       pylori testing.      Patchy mildly erythematous mucosa without active bleeding and with no       stigmata of bleeding was found in the duodenal bulb, in the first       portion of the duodenum and in the second portion of the duodenum.       Biopsies were taken with a cold forceps for histology and HP evaluation.      Normal ampulla.      ENDOSONOGRAPHIC  FINDING: :      Hypoechoic lesions suggestive of two cysts were identified in the       pancreatic head and pancreatic body. The first cyst measured 6.7 mm by       6.2 mm in the head of the pancreas. The second cyst measured 24 mm by 14       mm in the body of the pancreas. There seemed to be duct communication       for both pancreatic cysts. There was no associated masses in either       cyst. Diagnostic needle aspiration for fluid was performed of the larger       cyst in the body. Color Doppler imaging was utilized prior to needle       puncture to confirm a lack of significant vascular structures within the       needle path. One pass was made with the 22 gauge needle using a       transgastric approach. No stylet was used. The amount of fluid collected       was 8 mL. The fluid was turbid, blood-tinged and thin. Sample(s) were       sent for amylase concentration, cytology and CEA.      The pancreatic parenchyma and pancreatic duct had a normal       endosonographic appearance in the pancreatic head (1.0 mm), genu of the       pancreas (1.0 mm), body of the pancreas (0.8 mm) and tail of the       pancreas (1.0 mm).      There was no sign of significant endosonographic abnormality in the       common bile duct (1.5 mm -> 2.7 mm) and in the common hepatic duct (5.6       mm).      Endosonographic imaging of the ampulla showed no extrinsic compression,       intramural (subepithelial) lesion, mass, varices or wall thickening.      Endosonographic imaging in the visualized portion of the liver showed no       mass.      No malignant-appearing lymph nodes were visualized in the celiac region       (  level 20), peripancreatic region and porta hepatis region.      The celiac region was visualized. Impression:               EGD Impression:                           - No gross lesions in esophagus. Z-line at 40 cm.                           - Small 1 cm hiatal hernia.                            - Gastritis. Non-bleeding gastric ulcers with a                            clean ulcer base (Forrest Class III). Biopsied for                            HP.                           - Erythematous duodenopathy. Biopsied.                           - Normal ampulla.                           EUS Impression:                           - Two cystic lesions were seen in the pancreatic                            head and pancreatic body (largest) - duct                            communication is felt to be present to both. FNA                            performed of larger cyst (sent for                            CEA/Amylase/Cytology) results are pending. 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                           - The pancreatic duct and parenchyma had normal                            endosonographic appearance in the pancreatic head,                            genu of the pancreas, body of the pancreas and tail                            of the pancreas.                           - There was no sign of significant pathology in the                            common bile duct and in the common hepatic duct.                           - No malignant-appearing lymph nodes were                            visualized in the celiac region (level 20),                            peripancreatic region and porta hepatis region. Moderate Sedation:      Not Applicable - Patient had care per Anesthesia. Recommendation:           - The patient will be observed post-procedure,                            until all discharge criteria are met.                            - Discharge patient to home.                           - Patient has a contact number available for                            emergencies. The signs and symptoms of potential                            delayed complications were discussed with the                            patient. Return to normal activities tomorrow.                            Written discharge instructions were provided to the  patient.                           - Low fat diet.                           - Ciprofloxacin 500 mg twice daily for next 3-days.                           - Await cytology results and await path results.                           - Start Pepcid 20 mg twice daily.                           - Discuss with patient if she agrees to trial                            another PPI (had reaction in allergies to [place]).                           - Consider Carafate use if no PPI is initiated.                           - Repeat EGD in 3-4 months to ensure healing of                            gastric ulcers.                           - The findings and recommendations were discussed                            with the patient.                           - The findings and recommendations were discussed                            with the patient's family. Procedure Code(s):        --- Professional ---                           [phone], Esophagogastroduodenoscopy, flexible,                            transoral; with transendoscopic ultrasound-guided                            intramural or transmural fine needle                            aspiration/biopsy(s), (includes endoscopic                            ultrasound examination limited to the esophagus,  stomach or duodenum, and adjacent structures) Diagnosis Code(s):        --- Professional ---                           K29.70, Gastritis, unspecified, without bleeding                            K25.9, Gastric ulcer, unspecified as acute or                            chronic, without hemorrhage or perforation                           K31.89, Other diseases of stomach and duodenum                           K86.2, Cyst of pancreas                           I89.9, Noninfective disorder of lymphatic vessels                            and lymph nodes, unspecified CPT copyright 2019 [employer]. All rights reserved. The codes documented in this report are preliminary and upon coder review may  be revised to meet current compliance requirements. Justice Britain, MD 07/03/2020 9:05:10 AM Number of Addenda: 0

## 2020-07-04 ENCOUNTER — Other Ambulatory Visit: Payer: Self-pay

## 2020-07-04 LAB — SURGICAL PATHOLOGY

## 2020-07-05 ENCOUNTER — Encounter (HOSPITAL_COMMUNITY): Payer: Self-pay | Admitting: Gastroenterology

## 2020-07-05 LAB — CYTOLOGY - NON PAP

## 2020-07-07 ENCOUNTER — Encounter: Payer: Self-pay | Admitting: Gastroenterology

## 2020-07-09 ENCOUNTER — Encounter: Payer: Self-pay | Admitting: Gastroenterology

## 2020-07-09 DIAGNOSIS — I10 Essential (primary) hypertension: Secondary | ICD-10-CM | POA: Diagnosis not present

## 2020-07-09 DIAGNOSIS — I2510 Atherosclerotic heart disease of native coronary artery without angina pectoris: Secondary | ICD-10-CM | POA: Diagnosis not present

## 2020-07-09 DIAGNOSIS — E785 Hyperlipidemia, unspecified: Secondary | ICD-10-CM | POA: Diagnosis not present

## 2020-07-09 LAB — LIPID PANEL

## 2020-07-09 NOTE — Progress Notes (Signed)
Review of [facility] diagnostic laboratories.  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 Once the PancreaGEN molecular testing finalizes will update Dr. Freida Busman and patient further. We will have these records scanned into the chart.   Corliss Parish, MD [facility] Office # [phone]

## 2020-07-10 LAB — HEPATIC FUNCTION PANEL
ALT: 17 IU/L (ref 0–32)
AST: 18 IU/L (ref 0–40)
Albumin: 4.2 g/dL (ref 3.7–4.7)
Alkaline Phosphatase: 121 IU/L (ref 44–121)
Bilirubin Total: 0.5 mg/dL (ref 0.0–1.2)
Bilirubin, Direct: 0.13 mg/dL (ref 0.00–0.40)
Total Protein: 6.6 g/dL (ref 6.0–8.5)

## 2020-07-10 LAB — LIPID PANEL
Chol/HDL Ratio: 2.6 ratio (ref 0.0–4.4)
Cholesterol, Total: 150 mg/dL (ref 100–199)
HDL: 58 mg/dL (ref >39)
LDL Chol Calc (NIH): 73 mg/dL (ref 0–99)
Triglycerides: 108 mg/dL (ref 0–149)
VLDL Cholesterol Cal: 19 mg/dL (ref 5–40)

## 2020-07-10 LAB — BASIC METABOLIC PANEL
BUN/Creatinine Ratio: 13 (ref 12–28)
BUN: 11 mg/dL (ref 8–27)
CO2: 25 mmol/L (ref 20–29)
Calcium: 10.1 mg/dL (ref 8.7–10.3)
Chloride: 104 mmol/L (ref 96–106)
Creatinine, Ser: 0.82 mg/dL (ref 0.57–1.00)
GFR calc Af Amer: 81 mL/min/{1.73_m2} (ref >59)
GFR calc non Af Amer: 70 mL/min/{1.73_m2} (ref >59)
Glucose: 93 mg/dL (ref 65–99)
Potassium: 4.5 mmol/L (ref 3.5–5.2)
Sodium: 141 mmol/L (ref 134–144)

## 2020-07-10 NOTE — Progress Notes (Signed)
Cardiology Office Note    Date:  07/12/2020   ID:  Kelli Roy, DOB [birth year], MRN [id number]  PCP:  Salley Scarlet, MD  Cardiologist:  Dr. [place]  Chief Complaint  Patient presents with  . Hypertension  . Coronary Artery Disease    History of Present Illness:  Kelli Roy is a 75 y.o. female is seen for follow up.  She has a PMH of CAD, chronic systolic heart failure, hypertension, hyperlipidemia, and history of ischemic cardiomyopathy.  Patient had anterior STEMI in September 2015 and underwent emergent stenting of the proximal LAD with a BMS.  She also had 50 to 70% left main disease, 70 to 80% ostial LAD disease, 50 to 70% ostial left circumflex disease, EF was 25 to 30%.  She was placed on high-dose Lipitor therapy but noted to have marked elevation of her transaminases and her statin was discontinued.  Since then, she has been tolerating Crestor.  She eventually returned in November 2015 for CABG by Dr. Laneta Simmers was LIMA to LAD, SVG to diagonal, SVG to OM, SVG to RCA.  She has a history of a rash reaction to both Brilinta and Plavix.  Echocardiogram in February 2016 showed EF 35 to 40% with akinesis to the anteroseptum and apex.  She developed acute heart failure in March 2016.  She was diuresed with IV Lasix and it was discharged home with addition of Lasix and Aldactone.  She later developed hypotension requiring the discontinuation of Coreg and Aldactone.  She had a Myoview in January 2017 that showed scar in the anteroseptal, apical and inferolateral segment without ischemia, EF 49%.  She was seen on 12/02/2017, she was having a very atypical chest discomfort.  She sought medical attention in [facility] emergency room after having persistent chest soreness for several hours.  Troponin was negative at the time.  EKG showed no significant changes.  Given the atypical nature of her symptoms no further work up recommended.  She was seen in the ED on 05/06/19 with chest  pain and tightness. Ecg showed no acute change and HS troponin negative x 2. She states she was standing at the sink and got really weak- didn't feel well. She was nauseated. She had some tightening in her chest. Still felt poorly the next day and went to the ED. Gradually she has felt better this week. No more chest pressure. Only SOB when carries laundry up stairs. Myoview was done and showed an area of scar. No ischemia. Unchanged from 2017. EF 49%.   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 EGD done showing gastric inflammation and non bleeding gastric ulcers. Pancreatic cyst biopsied. Bx was apparently benign. Was placed on Pepcid.   On follow up today she is doing well from a cardiac standpoint. BP readings at home indicate occasional  high reading but sometimes BP is a little low. No other chest pain or dyspnea.   Past Medical History:  Diagnosis Date  . Allergy   . Alopecia   . Arthritis   . CAD (coronary artery disease)    a. 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 Laneta Simmers   . Cataract   . Chronic systolic CHF (congestive  heart failure) ([place])    a. 03/2014 Echo: EF 25-30%; Pre op TEE 05/2014 with EF of 50%  . Family history of cardiovascular disease   . Family history of colon cancer   . History of long-term treatment with high-risk medication   . Hyperlipidemia   . Hypertension   . Insomnia   . Ischemic cardiomyopathy    a. 03/2014 Echo: EF 25-30%, mid-apical/anteroseptal and inf AK, Gr 2 DD, ? apical thrombus, Mild MR.-->Discharged with LifeVest.  . Malaise and fatigue   . Myocardial infarction ([place]) 03/2014  . Shingles 09/18/2019  . Shortness of breath dyspnea   . Sinus headache     Past Surgical History:  Procedure Laterality Date   . BIOPSY  07/03/2020   Procedure: BIOPSY;  Surgeon: Rush Landmark Telford Nab., MD;  Location: [facility];  Service: Gastroenterology;;  . CARDIAC CATHETERIZATION  2001   showed no blockages  . CATARACT EXTRACTION W/PHACO Left 01/04/2014   Procedure: CATARACT EXTRACTION PHACO AND INTRAOCULAR LENS PLACEMENT (IOC);  Surgeon: Tonny Branch, MD;  Location: [facility];  Service: Ophthalmology;  Laterality: Left;  CDE:5.65  . CATARACT EXTRACTION W/PHACO Right 01/29/2014   Procedure: CATARACT EXTRACTION PHACO AND INTRAOCULAR LENS PLACEMENT RIGHT EYE CDE=4.49;  Surgeon: Tonny Branch, MD;  Location: [facility];  Service: Ophthalmology;  Laterality: Right;  . CERVICAL SPINE SURGERY  09/27/2003   C6-67 servical fusion  . COLONOSCOPY WITH PROPOFOL N/A 05/18/2016   Procedure: COLONOSCOPY WITH PROPOFOL;  Surgeon: Garlan Fair, MD;  Location: [facility];  Service: Endoscopy;  Laterality: N/A;  . CORONARY ARTERY BYPASS GRAFT N/A 05/21/2014   Procedure: CORONARY ARTERY BYPASS GRAFTING (CABG);  Surgeon: Gaye Pollack, MD;  Location: [facility];  Service: Open Heart Surgery;  Laterality: N/A;  . CYSTECTOMY  1998   fibroid cysts  . DILATION AND CURETTAGE OF UTERUS  1976   2nd to miscarriage  . ESOPHAGOGASTRODUODENOSCOPY (EGD) WITH PROPOFOL N/A 07/03/2020   Procedure: ESOPHAGOGASTRODUODENOSCOPY (EGD) WITH PROPOFOL;  Surgeon: Rush Landmark Telford Nab., MD;  Location: [facility];  Service: Gastroenterology;  Laterality: N/A;  . EUS N/A 07/03/2020   Procedure: UPPER ENDOSCOPIC ULTRASOUND (EUS) RADIAL;  Surgeon: Rush Landmark Telford Nab., MD;  Location: [facility];  Service: Gastroenterology;  Laterality: N/A;  . EYE SURGERY    . FINE NEEDLE ASPIRATION  07/03/2020   Procedure: FINE NEEDLE ASPIRATION;  Surgeon: Rush Landmark Telford Nab., MD;  Location: [facility];  Service: Gastroenterology;;  . INTRAOPERATIVE TRANSESOPHAGEAL ECHOCARDIOGRAM N/A 05/21/2014   Procedure: INTRAOPERATIVE TRANSESOPHAGEAL ECHOCARDIOGRAM;  Surgeon:  Gaye Pollack, MD;  Location: [place];  Service: Open Heart Surgery;  Laterality: N/A;  . LEFT HEART CATHETERIZATION WITH CORONARY ANGIOGRAM N/A 03/08/2014   Procedure: LEFT HEART CATHETERIZATION WITH CORONARY ANGIOGRAM;  Surgeon: Elhadji Pecore M Martinique, MD;  Location: [facility];  Service: Cardiovascular;  Laterality: N/A;  . 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 PERCUTANEOUS CORONARY STENT INTERVENTION (PCI-S)  03/08/2014   Procedure: PERCUTANEOUS CORONARY STENT INTERVENTION (PCI-S);  Surgeon: Merrill Deanda M Martinique, MD;  Location: [facility] CATH LAB;  Service: Cardiovascular;;  prox lad  bms    Current Medications: Outpatient Medications Prior to Visit  Medication Sig Dispense Refill  . Acetaminophen 500 MG capsule Take 500 mg by mouth every 6 (six) hours as needed for moderate pain or headache.     . Ascorbic Acid (VITAMIN C) 1000 MG tablet Take 1,000 mg by mouth daily.    Marland Kitchen aspirin EC 81 MG tablet Take  81 mg by mouth at bedtime.     . Cholecalciferol (VITAMIN D3) 50 MCG (2000 UT) capsule Take 2,000 Units by mouth daily.    . Coenzyme Q10-Vitamin E (QUNOL ULTRA COQ10 PO) Take 200 mg by mouth at bedtime. Liquid    . Cyanocobalamin (VITAMIN B12 PO) Take 3,000 mcg by mouth daily.    Marland Kitchen docusate sodium (COLACE) 100 MG capsule Take 100-200 mg by mouth See admin instructions. Take 100 mg by mouth in the morning and 200 mg at bedtime    . eszopiclone (LUNESTA) 1 MG TABS tablet Take immediately before bedtime (Patient taking differently: Take 0.5-1 mg by mouth at bedtime as needed for sleep.) 30 tablet 2  . famotidine (PEPCID) 20 MG tablet Take 1 tablet (20 mg total) by mouth 2 (two) times daily. 60 tablet 6  . famotidine (PEPCID) 20 MG tablet Take 20 mg by mouth 2 (two) times daily.    . furosemide (LASIX) 20 MG tablet TAKE 1 TABLET BY MOUTH EVERY DAY - MAY TAKE 1 ADDITIONAL TABLET AS NEEDED (Patient taking differently: Take 20 mg by mouth daily. - MAY TAKE 1 ADDITIONAL 20 MG AS  NEEDED) 180 tablet 0  . ipratropium (ATROVENT) 0.06 % nasal spray Place 2 sprays into both nostrils daily as needed for rhinitis.    Marland Kitchen loratadine (CLARITIN) 10 MG tablet Take 10 mg by mouth daily as needed for allergies or rhinitis.    Marland Kitchen losartan (COZAAR) 50 MG tablet TAKE 1 TABLET BY MOUTH EVERY DAY (Patient taking differently: Take 50 mg by mouth at bedtime.) 90 tablet 3  . Magnesium Cl-Calcium Carbonate (SLOW-MAG PO) Take 1 tablet by mouth 2 (two) times daily.    . Multiple Vitamins-Minerals (PRESERVISION/LUTEIN PO) Take by mouth.    . nitroGLYCERIN (NITROSTAT) 0.4 MG SL tablet Place 1 tablet (0.4 mg total) under the tongue every 5 (five) minutes as needed for chest pain. 25 tablet 3  . rosuvastatin (CRESTOR) 40 MG tablet Take 1 tablet (40 mg total) by mouth daily. (Patient taking differently: Take 40 mg by mouth at bedtime.) 90 tablet 3  . tetrahydrozoline-zinc (VISINE-AC) 0.05-0.25 % ophthalmic solution Place 2 drops into both eyes daily as needed (for dry or irritated eyes).     . trolamine salicylate (ASPERCREME/ALOE) 10 % cream Apply 1 application topically daily as needed (bursitis).     No facility-administered medications prior to visit.     Allergies:   Carvedilol, Spironolactone, Demerol [meperidine], Lipitor [atorvastatin], Lisinopril, Dexilant [dexlansoprazole], Latex, Neomycin, Plavix [clopidogrel bisulfate], Sulfonamide derivatives, and Ticagrelor   Social History   Socioeconomic History  . Marital status: Married    Spouse name: Not on file  . Number of children: Not on file  . Years of education: Not on file  . Highest education level: Not on file  Occupational History  . Not on file  Tobacco Use  . Smoking status: Never Smoker  . Smokeless tobacco: Never Used  Vaping Use  . Vaping Use: Never used  Substance and Sexual Activity  . Alcohol use: Not Currently    Comment: rare glass of wine  . Drug use: No  . Sexual activity: Not Currently    Birth  control/protection: Post-menopausal  Other Topics Concern  . Not on file  Social History Narrative  . Not on file   Social Determinants of Health   Financial Resource Strain: Low Risk   . Difficulty of Paying Living Expenses: Not very hard  Food Insecurity: Not on file  Transportation  Needs: Not on file  Physical Activity: Not on file  Stress: Not on file  Social Connections: Not on file     Family History:  The patient's family history includes Atrial fibrillation in her father; Cancer in her brother, brother, and father; Colon cancer in her father; Coronary artery disease in her brother and brother; Hearing loss in her father; Heart attack in her mother; Heart disease in her mother; Hypertension in her brother, brother, brother, brother, and father; Pancreatic cancer in her brother; Peripheral vascular disease in her father; Stroke in her mother; Thyroid disease in her brother, father, and sister.   ROS:   Please see the history of present illness.    ROS All other systems reviewed and are negative.   PHYSICAL EXAM:   VS:  BP (!) 168/60   Pulse (!) 57   Ht 5\' 5"  (1.651 m)   Wt 150 lb 6.4 oz (68.2 kg)   SpO2 99%   BMI 25.03 kg/m    GEN: Well nourished, well developed, in no acute distress  HEENT: normal  Neck: no JVD, carotid bruits, or masses Cardiac: RRR; no murmurs, rubs, or gallops,no edema  Respiratory:  clear to auscultation bilaterally, normal work of breathing GI: soft, nontender, nondistended, + BS MS: no deformity or atrophy  Skin: warm and dry, no rash Neuro:  Alert and Oriented x 3, Strength and sensation are intact Psych: euthymic mood, full affect  Wt Readings from Last 3 Encounters:  07/12/20 150 lb 6.4 oz (68.2 kg)  07/03/20 150 lb (68 kg)  04/29/20 150 lb 3.2 oz (68.1 kg)      Studies/Labs Reviewed:   EKG:  EKG is  ordered today. Sinus brady rate 53. Old septal infarct. I have personally reviewed and interpreted this study.    Recent  Labs: 04/19/2020: Hemoglobin 13.9; Platelets 169 07/09/2020: ALT 17; BUN 11; Creatinine, Ser 0.82; Potassium 4.5; Sodium 141   Lipid Panel    Component Value Date/Time   CHOL 150 07/09/2020 1022   TRIG 108 07/09/2020 1022   HDL 58 07/09/2020 1022   CHOLHDL 2.6 07/09/2020 1022   CHOLHDL 2.3 04/19/2020 1536   VLDL 25 07/14/2016 1211   LDLCALC 73 07/09/2020 1022   LDLCALC 69 04/19/2020 1536    Additional studies/ records that were reviewed today include:   Echo 08/30/2014 LV EF: 35% -  40% Study Conclusions  - Left ventricle: Poor acoustical windows with multiple wall motion abnormalities. Recommend limited study with definity contrast agent for more accurate assessment. There is distal septal akinesis. The cavity size was normal. Systolic function was moderately reduced. The estimated ejection fraction was in the range of 35% to 40%. There is akinesis of the apical myocardium. There is akinesis of the apicalanterior myocardium. There is akinesis of the midanteroseptal myocardium. There is akinesis of the apicallateral myocardium. There was a reduced contribution of atrial contraction to ventricular filling, due to increased ventricular diastolic pressure or atrial contractile dysfunction. Doppler parameters are consistent with a reversible restrictive pattern, indicative of decreased left ventricular diastolic compliance and/or increased left atrial pressure (grade 3 diastolic dysfunction). - Aortic valve: Trileaflet; normal thickness, mildly calcified leaflets. - Mitral valve: There was mild regurgitation. - Left atrium: The atrium was mildly dilated. - Pulmonic valve: There was mild regurgitation.   Myoview 07/10/2015 Study Highlights    Nuclear stress EF: 49%.  The left ventricular ejection fraction is mildly decreased (45-54%).  There was no ST segment deviation noted during stress.  Defect  1: There is a defect present in the mid  anteroseptal, mid inferolateral, apical septal, apical lateral and apex location.  Findings consistent with prior myocardial infarction.  This is a low risk study.   1. Low risk study 2. Scar antero septal, apical and inferolateral. 3. Low nl EF   Myoview 05/23/19: Study Highlights    The left ventricular ejection fraction is mildly decreased (45-54%).  Nuclear stress EF: 49%.  No T wave inversion was noted during stress.  There was no ST segment deviation noted during stress.  Defect 1: There is a large defect of severe severity present in the mid anterior, mid anteroseptal, mid inferolateral, apical anterior, apical septal, apical inferior, apical lateral and apex location.  Findings consistent with prior myocardial infarction.  This is a low risk study.   Large size, severe severity fixed distal anteroseptal, anterior, apical, inferoapical and apical lateral perfusion defect suggestive of scar without significant reversible ischemia (SDS 1). LVEF 49% with distal anterior, anteroseptal and apical akinesis. This is an intermediate risk study. No change compared to prior study in 2017.    ASSESSMENT:    No diagnosis found.   PLAN:  In order of problems listed above:  1. HTN. Readings are generally well controlled. Occasional elevation.  Continue current therapy.  2.   CAD: Last Myoview Nov 2020- stable. She has stable class 1 angina.  3.   Chronic systolic heart failure: appears well compensated.   4.   Hyperlipidemia: LDL improved to 69 with increased Crestor dose.   5.   Ischemic cardiomyopathy: On losartan, unable to tolerate beta-blocker due to baseline bradycardia.  6. Pancreatic cyst - per GI  7. Gastric ulcers. On Pepcid   Medication Adjustments/Labs and Tests Ordered: Current medicines are reviewed at length with the patient today.  Concerns regarding medicines are outlined above.  Medication changes, Labs and Tests ordered today are listed in the  Patient Instructions below. There are no Patient Instructions on file for this visit.   Signed, Kinley Dozier Martinique, MD  07/12/2020 3:13 PM    [facility] [address], [place], [place]  [address] Phone: [phone]; Fax: [phone]

## 2020-07-12 ENCOUNTER — Other Ambulatory Visit: Payer: Self-pay

## 2020-07-12 ENCOUNTER — Ambulatory Visit: Payer: PPO | Admitting: Cardiology

## 2020-07-12 ENCOUNTER — Encounter: Payer: Self-pay | Admitting: Cardiology

## 2020-07-12 VITALS — BP 168/60 | HR 57 | Ht 65.0 in | Wt 150.4 lb

## 2020-07-12 DIAGNOSIS — E7800 Pure hypercholesterolemia, unspecified: Secondary | ICD-10-CM

## 2020-07-12 DIAGNOSIS — I5022 Chronic systolic (congestive) heart failure: Secondary | ICD-10-CM | POA: Diagnosis not present

## 2020-07-12 DIAGNOSIS — Z951 Presence of aortocoronary bypass graft: Secondary | ICD-10-CM | POA: Diagnosis not present

## 2020-07-12 DIAGNOSIS — I10 Essential (primary) hypertension: Secondary | ICD-10-CM

## 2020-07-12 DIAGNOSIS — I2510 Atherosclerotic heart disease of native coronary artery without angina pectoris: Secondary | ICD-10-CM

## 2020-07-14 NOTE — Progress Notes (Signed)
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   Would recommend at least a 1 year follow-up MRI/MRCP, though a repeat MRI at the 46-monthinterval may not be unreasonable to ensure clinical stability before going further to a 1 year follow-up. Plan to monitor this for at least a total of 5 years to ensure stability and no significant other changes. Will forward these results to Dr. SMichaelle Birksand we can finalize a plan together in regards to monitoring of this cyst versus any additional therapies that may be required.  GJustice Britain MD [facility] Office # [phone]

## 2020-07-15 NOTE — Progress Notes (Signed)
I spoke with the pt and discussed the lab findings.  The pt asked that she get an appt to discuss things further with Dr Rush Landmark.  She has been scheduled for 08/22/20 at 3:10 pm

## 2020-07-19 ENCOUNTER — Telehealth: Payer: Self-pay | Admitting: Pharmacist

## 2020-07-19 NOTE — Progress Notes (Addendum)
Chronic Care Management Pharmacy Assistant   Name: Kelli Roy  MRN: [id number] DOB: 11-06-1945  Reason for Encounter: Disease State for CHL.  Patient Questions:  1.  Have you seen any other providers since your last visit? Yes.   2.  Any changes in your medicines or health? Yes.    PCP : Alycia Rossetti, MD   Their chronic conditions include: HTN, HF, CAD, Allergic Rhinitis, Osteopenia, HLD, Insomnia.  Office Visits: 04/29/20 Dr. Buelah Manis for UTI STARTED Nitrofurantoin Monohyd Macro 100 mg for 4 days. 04/19/20 Dr. Buelah Manis General check up. Scheduled for a MRI of pancreas cyst. STOPPED Cephalexin. STARTED Eszopidone 1 mg before bedtime.  Consults:  07/12/20 Cardio- No medication changes. 05/24/20 Radiology-Bertrand, Rachael Fee. No medication given.  05/06/20 General Surgery-Allen, Merrick No information given.   Hospital: 07/03/20 Dr. Rush Landmark for pancreas cyst. Biopsy of a lesion was performed. ([facility] Endoscopic) No medication changes.   Allergies:   Allergies  Allergen Reactions   Carvedilol Shortness Of Breath and Other (See Comments)    Also caused fluid to accumulate around her heart; resulted in hospitalization   Spironolactone Shortness Of Breath and Other (See Comments)    Also caused fluid to accumulate around her heart; resulted in hospitalization   Demerol [Meperidine] Nausea Only   Lipitor [Atorvastatin] Itching   Lisinopril Cough   Dexilant [Dexlansoprazole] Rash   Latex Rash   Neomycin Rash   Plavix [Clopidogrel Bisulfate] Rash   Sulfonamide Derivatives Rash   Ticagrelor Itching and Rash    Brilinta     Medications: Outpatient Encounter Medications as of 07/19/2020  Medication Sig   Acetaminophen 500 MG capsule Take 500 mg by mouth every 6 (six) hours as needed for moderate pain or headache.    Ascorbic Acid (VITAMIN C) 1000 MG tablet Take 1,000 mg by mouth daily.   aspirin EC 81 MG tablet Take 81 mg by mouth at bedtime.     Cholecalciferol (VITAMIN D3) 50 MCG (2000 UT) capsule Take 2,000 Units by mouth daily.   Coenzyme Q10-Vitamin E (QUNOL ULTRA COQ10 PO) Take 200 mg by mouth at bedtime. Liquid   Cyanocobalamin (VITAMIN B12 PO) Take 3,000 mcg by mouth daily.   docusate sodium (COLACE) 100 MG capsule Take 100-200 mg by mouth See admin instructions. Take 100 mg by mouth in the morning and 200 mg at bedtime   eszopiclone (LUNESTA) 1 MG TABS tablet Take immediately before bedtime (Patient taking differently: Take 0.5-1 mg by mouth at bedtime as needed for sleep.)   famotidine (PEPCID) 20 MG tablet Take 1 tablet (20 mg total) by mouth 2 (two) times daily.   famotidine (PEPCID) 20 MG tablet Take 20 mg by mouth 2 (two) times daily.   furosemide (LASIX) 20 MG tablet TAKE 1 TABLET BY MOUTH EVERY DAY - MAY TAKE 1 ADDITIONAL TABLET AS NEEDED (Patient taking differently: Take 20 mg by mouth daily. - MAY TAKE 1 ADDITIONAL 20 MG AS NEEDED)   ipratropium (ATROVENT) 0.06 % nasal spray Place 2 sprays into both nostrils daily as needed for rhinitis.   loratadine (CLARITIN) 10 MG tablet Take 10 mg by mouth daily as needed for allergies or rhinitis.   losartan (COZAAR) 50 MG tablet TAKE 1 TABLET BY MOUTH EVERY DAY (Patient taking differently: Take 50 mg by mouth at bedtime.)   Magnesium Cl-Calcium Carbonate (SLOW-MAG PO) Take 1 tablet by mouth 2 (two) times daily.   Multiple Vitamins-Minerals (PRESERVISION/LUTEIN PO) Take by mouth.   nitroGLYCERIN (NITROSTAT)  0.4 MG SL tablet Place 1 tablet (0.4 mg total) under the tongue every 5 (five) minutes as needed for chest pain.   rosuvastatin (CRESTOR) 40 MG tablet Take 1 tablet (40 mg total) by mouth daily. (Patient taking differently: Take 40 mg by mouth at bedtime.)   tetrahydrozoline-zinc (VISINE-AC) 0.05-0.25 % ophthalmic solution Place 2 drops into both eyes daily as needed (for dry or irritated eyes).    trolamine salicylate (ASPERCREME/ALOE) 10 % cream Apply 1 application topically daily  as needed (bursitis).   No facility-administered encounter medications on file as of 07/19/2020.    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 year old female who is a primary care patient of Bayard, Modena Nunnery, MD.  Comprehensive medication review performed; Spoke to patient regarding cholesterol  Lipid Panel    Component Value Date/Time   CHOL 150 07/09/2020 1022   TRIG 108 07/09/2020 1022   HDL 58 07/09/2020 1022   LDLCALC 73 07/09/2020 1022   LDLCALC 69 04/19/2020 1536    10-year ASCVD risk score: The ASCVD Risk score Mikey Bussing DC Jr., et al., 2013) failed to calculate for the following reasons:   The patient has a prior MI or stroke diagnosis  Current antihyperlipidemic regimen:  Rosuvastatin 40 mg  Previous antihyperlipidemic medications tried: None Noted  ASCVD risk enhancing conditions: age >34, HTN and CHF   What recent interventions/DTPs have been made by any provider to improve Cholesterol control since last CPP Visit: None.  Any recent hospitalizations or ED visits since last  visit with CPP? Yes on 07/03/20 for a pancreas cyst.   What diet changes have been made to improve Cholesterol?  Patient stated she is staying away from carb's and sugars.  What exercise is being done to improve Cholesterol?  Patent stated her actively level has gone down since COVID. Patient stated [place] not much since its been cold. Patient stated she does do house duties and walks up and down steps.   Adherence Review: Does the patient have >5 day gap between last estimated fill dates? No, CPP Please Check.   Patient stated she Pepcid for her cyst and it seems to be doing fine. She stated she doesn't have any concerns or questions about her health or medications at this time. Patient stated she is able to get all of her medications without any problems.   Follow-Up:  Pharmacist Review   Charlann Lange, RMA Clinical Pharmacist Assistant [phone]  6 minutes spent in review, coordination, and documentation.  Reviewed by: Beverly Milch, PharmD Clinical Pharmacist [facility] [phone]

## 2020-07-23 NOTE — Progress Notes (Signed)
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 Thank you. GM

## 2020-07-24 DIAGNOSIS — C44519 Basal cell carcinoma of skin of other part of trunk: Secondary | ICD-10-CM | POA: Diagnosis not present

## 2020-08-15 DIAGNOSIS — Z719 Counseling, unspecified: Secondary | ICD-10-CM | POA: Diagnosis not present

## 2020-08-15 DIAGNOSIS — Z01419 Encounter for gynecological examination (general) (routine) without abnormal findings: Secondary | ICD-10-CM | POA: Diagnosis not present

## 2020-08-22 ENCOUNTER — Ambulatory Visit: Payer: PPO | Admitting: Gastroenterology

## 2020-08-22 ENCOUNTER — Other Ambulatory Visit: Payer: Self-pay | Admitting: Cardiology

## 2020-08-22 ENCOUNTER — Other Ambulatory Visit: Payer: Self-pay

## 2020-08-22 ENCOUNTER — Encounter: Payer: Self-pay | Admitting: Gastroenterology

## 2020-08-22 VITALS — BP 124/60 | HR 60 | Ht 64.0 in | Wt 149.2 lb

## 2020-08-22 DIAGNOSIS — Z809 Family history of malignant neoplasm, unspecified: Secondary | ICD-10-CM

## 2020-08-22 DIAGNOSIS — D490 Neoplasm of unspecified behavior of digestive system: Secondary | ICD-10-CM | POA: Diagnosis not present

## 2020-08-22 DIAGNOSIS — Z800 Family history of malignant neoplasm of digestive organs: Secondary | ICD-10-CM | POA: Diagnosis not present

## 2020-08-22 DIAGNOSIS — Z8711 Personal history of peptic ulcer disease: Secondary | ICD-10-CM | POA: Diagnosis not present

## 2020-08-22 DIAGNOSIS — K862 Cyst of pancreas: Secondary | ICD-10-CM

## 2020-08-22 MED ORDER — SUPREP BOWEL PREP KIT 17.5-3.13-1.6 GM/177ML PO SOLN: 1.0000 | ORAL | 0 refills | Status: DC

## 2020-08-22 NOTE — Patient Instructions (Signed)
You have been scheduled for an endoscopy and colonoscopy. Please follow the written instructions given to you at your visit today. Please pick up your prep supplies at the pharmacy within the next 1-3 days. If you use inhalers (even only as needed), please bring them with you on the day of your procedure.  We have sent the following medications to your pharmacy for you to pick up at your convenience: Suprep  If you are age 76 or older, your body mass index should be between 23-30. Your Body mass index is 25.62 kg/m. If this is out of the aforementioned range listed, please consider follow up with your Primary Care Provider.  Thank you for choosing me and [facility].  Dr. Rush Landmark

## 2020-08-26 ENCOUNTER — Encounter: Payer: Self-pay | Admitting: Gastroenterology

## 2020-08-26 DIAGNOSIS — Z800 Family history of malignant neoplasm of digestive organs: Secondary | ICD-10-CM | POA: Insufficient documentation

## 2020-08-26 DIAGNOSIS — Z8711 Personal history of peptic ulcer disease: Secondary | ICD-10-CM | POA: Insufficient documentation

## 2020-08-26 DIAGNOSIS — D490 Neoplasm of unspecified behavior of digestive system: Secondary | ICD-10-CM | POA: Insufficient documentation

## 2020-08-26 DIAGNOSIS — K862 Cyst of pancreas: Secondary | ICD-10-CM | POA: Insufficient documentation

## 2020-08-26 NOTE — Progress Notes (Signed)
[place] VISIT   Primary Care Provider [place], Modena Nunnery, MD [address] [place] [address] [phone]  Patient Profile: Kelli Roy is a 75 y.o. female with a pmh significant for CAD (status post PCI), CHF, hypertension, hyperlipidemia, allergies, arthritis, cataracts, family history colon cancer (brother), family history pancreatic cancer (brother), GERD, pancreatic cysts consistent with BD-IPMN.  The patient presents to the [facility] for an evaluation and management of problem(s) noted below:  Problem List 1. Pancreatic cyst   2. IPMN (intraductal papillary mucinous neoplasm)   3. Family history of pancreatic cancer   4. History of gastric ulcer   5. Family history of colon cancer     History of Present Illness This is a patient that I met back in December in the setting is a referral for endoscopic ultrasound evaluation of pancreatic cystic lesions concerning for potential IPMNs.  She had been in a accident earlier in 2021 which led to imaging and eventually MRIs that showed evidence of pancreatic cyst which were followed with repeat imaging in the fall 2021.  She was evaluated by Dr. Zenia Resides pancreaticobiliary surgery.  She was then referred for endoscopic ultrasound evaluation.  The patient has never had evidence of pancreatitis previously.  She was not a significant consumer of alcohol in the past or present.  EUS imaging showed no evidence of chronic pancreatitis.  Patient underwent FNA with CEA and amylase levels consistent of a mucinous cyst most likely IPMN.  Further [facility]GEN molecular testing was performed which showed no evidence of malignant cells, normal DNA quantity and quality, and no evidence of oncogene point mutations for GMS or KRAS and no tumor suppressor genes.  The likelihood was that this was a benign cyst that would have a low likelihood of developing into cancer over her lifetime but would require  monitoring.  Our initial plan was to consider evaluation for a total of 5 years to ensure stability.  It was not clear that the patient would require pancreatic surgical intervention at this time.  Patient followed up with Dr. Ayesha Rumpf office and the plan will be for potential 16-monthMRI/MRCP.  Today, we discussed the findings once again of the patient's EUS.  She feels more at ease knowing the results.  There was some concern that the insurance did not pay for all of the molecular testing and she is going to reach out to them so that she can give [place] information as to whether we can try to help with the coding or letters that may be required to help get this covered.  Patient has no other significant issues.  She does make mention of her need for a colonoscopy for high risk screening/follow-up in the setting of her family history of her brother with colon cancer.  2017 colonoscopy was performed and was normal.  Patient has not been experiencing any significant abdominal pain.  She continues to take her PPI therapy as prescribed after her EUS to heal the gastric ulcer that was noted.  The patient is not taking significant nonsteroidals or BC/Goody powders.  She only takes aspirin.  GI Review of Systems Positive as above Negative for pyrosis, dysphagia, odynophagia, nausea, vomiting, pain, change in bowel habits, melena, hematochezia  Review of Systems General: Denies fevers/chills/weight loss unintentionally Cardiovascular: Denies chest pain/palpitations Pulmonary: Denies shortness of breath Gastroenterological: See HPI Genitourinary: Denies darkened urine Dermatological: Denies jaundice Psychological: Mood is stable   Medications Current Outpatient Medications  Medication  Sig Dispense Refill  . Acetaminophen 500 MG capsule Take 500 mg by mouth every 6 (six) hours as needed for moderate pain or headache.     . Ascorbic Acid (VITAMIN C) 1000 MG tablet Take 1,000 mg by mouth daily.    Marland Kitchen  aspirin EC 81 MG tablet Take 81 mg by mouth at bedtime.     . Cholecalciferol (VITAMIN D3) 50 MCG (2000 UT) capsule Take 2,000 Units by mouth daily.    . Coenzyme Q10-Vitamin E (QUNOL ULTRA COQ10 PO) Take 200 mg by mouth at bedtime. Liquid    . Cyanocobalamin (VITAMIN B12 PO) Take 3,000 mcg by mouth daily.    Marland Kitchen docusate sodium (COLACE) 100 MG capsule Take 100-200 mg by mouth See admin instructions. Take 100 mg by mouth in the morning and 200 mg at bedtime    . eszopiclone (LUNESTA) 1 MG TABS tablet Take immediately before bedtime (Patient taking differently: Take 0.5-1 mg by mouth at bedtime as needed for sleep.) 30 tablet 2  . famotidine (PEPCID) 20 MG tablet Take 1 tablet (20 mg total) by mouth 2 (two) times daily. 60 tablet 6  . furosemide (LASIX) 20 MG tablet TAKE 1 TABLET BY MOUTH EVERY DAY - MAY TAKE 1 ADDITIONAL TABLET AS NEEDED 180 tablet 2  . ipratropium (ATROVENT) 0.06 % nasal spray Place 2 sprays into both nostrils daily as needed for rhinitis.    Marland Kitchen loratadine (CLARITIN) 10 MG tablet Take 10 mg by mouth daily as needed for allergies or rhinitis.    Marland Kitchen losartan (COZAAR) 50 MG tablet TAKE 1 TABLET BY MOUTH EVERY DAY (Patient taking differently: Take 50 mg by mouth at bedtime.) 90 tablet 3  . Magnesium Cl-Calcium Carbonate (SLOW-MAG PO) Take 1 tablet by mouth 2 (two) times daily.    . Multiple Vitamins-Minerals (PRESERVISION/LUTEIN PO) Take by mouth.    . Na Sulfate-K Sulfate-Mg Sulf (SUPREP BOWEL PREP KIT) 17.5-3.13-1.6 GM/177ML SOLN Take 1 kit by mouth as directed. For colonoscopy prep 354 mL 0  . rosuvastatin (CRESTOR) 40 MG tablet Take 1 tablet (40 mg total) by mouth daily. (Patient taking differently: Take 40 mg by mouth at bedtime.) 90 tablet 3  . tetrahydrozoline-zinc (VISINE-AC) 0.05-0.25 % ophthalmic solution Place 2 drops into both eyes daily as needed (for dry or irritated eyes).     . trolamine salicylate (ASPERCREME/ALOE) 10 % cream Apply 1 application topically daily as needed  (bursitis).    . nitroGLYCERIN (NITROSTAT) 0.4 MG SL tablet Place 1 tablet (0.4 mg total) under the tongue every 5 (five) minutes as needed for chest pain. (Patient not taking: Reported on 08/22/2020) 25 tablet 3   No current facility-administered medications for this visit.    Allergies Allergies  Allergen Reactions  . Carvedilol Shortness Of Breath and Other (See Comments)    Also caused fluid to accumulate around her heart; resulted in hospitalization  . Spironolactone Shortness Of Breath and Other (See Comments)    Also caused fluid to accumulate around her heart; resulted in hospitalization  . Demerol [Meperidine] Nausea Only  . Lipitor [Atorvastatin] Itching  . Lisinopril Cough  . Dexilant [Dexlansoprazole] Rash  . Latex Rash  . Neomycin Rash  . Plavix [Clopidogrel Bisulfate] Rash  . Sulfonamide Derivatives Rash  . Ticagrelor Itching and Rash    Brilinta     Histories Past Medical History:  Diagnosis Date  . Allergy   . Alopecia   . Arthritis   . CAD (coronary artery disease)    a.  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 Cyndia Bent   . Cataract   . Chronic systolic CHF (congestive heart failure) ([place])    a. 03/2014 Echo: EF 25-30%; Pre op TEE 05/2014 with EF of 50%  . Family history of cardiovascular disease   . Family history of colon cancer   . History of long-term treatment with high-risk medication   . Hyperlipidemia   . Hypertension   . Insomnia   . Ischemic cardiomyopathy    a. 03/2014 Echo: EF 25-30%, mid-apical/anteroseptal and inf AK, Gr 2 DD, ? apical thrombus, Mild MR.-->Discharged with LifeVest.  . Malaise and fatigue   . Myocardial infarction ([place]) 03/2014  . Shingles 09/18/2019  . Shortness of breath dyspnea   . Sinus headache    Past  Surgical History:  Procedure Laterality Date  . BIOPSY  07/03/2020   Procedure: BIOPSY;  Surgeon: Rush Landmark Telford Nab., MD;  Location: [facility];  Service: Gastroenterology;;  . CARDIAC CATHETERIZATION  2001   showed no blockages  . CATARACT EXTRACTION W/PHACO Left 01/04/2014   Procedure: CATARACT EXTRACTION PHACO AND INTRAOCULAR LENS PLACEMENT (IOC);  Surgeon: Tonny Branch, MD;  Location: [facility];  Service: Ophthalmology;  Laterality: Left;  CDE:5.65  . CATARACT EXTRACTION W/PHACO Right 01/29/2014   Procedure: CATARACT EXTRACTION PHACO AND INTRAOCULAR LENS PLACEMENT RIGHT EYE CDE=4.49;  Surgeon: Tonny Branch, MD;  Location: [facility];  Service: Ophthalmology;  Laterality: Right;  . CERVICAL SPINE SURGERY  09/27/2003   C6-67 servical fusion  . COLONOSCOPY WITH PROPOFOL N/A 05/18/2016   Procedure: COLONOSCOPY WITH PROPOFOL;  Surgeon: Garlan Fair, MD;  Location: [facility];  Service: Endoscopy;  Laterality: N/A;  . CORONARY ARTERY BYPASS GRAFT N/A 05/21/2014   Procedure: CORONARY ARTERY BYPASS GRAFTING (CABG);  Surgeon: Gaye Pollack, MD;  Location: [facility];  Service: Open Heart Surgery;  Laterality: N/A;  . CYSTECTOMY  1998   fibroid cysts  . DILATION AND CURETTAGE OF UTERUS  1976   2nd to miscarriage  . ESOPHAGOGASTRODUODENOSCOPY (EGD) WITH PROPOFOL N/A 07/03/2020   Procedure: ESOPHAGOGASTRODUODENOSCOPY (EGD) WITH PROPOFOL;  Surgeon: Rush Landmark Telford Nab., MD;  Location: [facility];  Service: Gastroenterology;  Laterality: N/A;  . EUS N/A 07/03/2020   Procedure: UPPER ENDOSCOPIC ULTRASOUND (EUS) RADIAL;  Surgeon: Rush Landmark Telford Nab., MD;  Location: [facility];  Service: Gastroenterology;  Laterality: N/A;  . EYE SURGERY    . FINE NEEDLE ASPIRATION  07/03/2020   Procedure: FINE NEEDLE ASPIRATION;  Surgeon: Rush Landmark Telford Nab., MD;  Location: [facility];  Service: Gastroenterology;;  . INTRAOPERATIVE TRANSESOPHAGEAL ECHOCARDIOGRAM N/A 05/21/2014   Procedure: INTRAOPERATIVE  TRANSESOPHAGEAL ECHOCARDIOGRAM;  Surgeon: Gaye Pollack, MD;  Location: [facility];  Service: Open Heart Surgery;  Laterality: N/A;  . LEFT HEART CATHETERIZATION WITH CORONARY ANGIOGRAM N/A 03/08/2014   Procedure: LEFT HEART CATHETERIZATION WITH CORONARY ANGIOGRAM;  Surgeon: Peter M Martinique, MD;  Location: [facility];  Service: Cardiovascular;  Laterality: N/A;  . 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 PERCUTANEOUS CORONARY STENT INTERVENTION (PCI-S)  03/08/2014   Procedure: PERCUTANEOUS CORONARY STENT INTERVENTION (PCI-S);  Surgeon: Peter M Martinique, MD;  Location: [facility];  Service: Cardiovascular;;  prox lad  bms   Social History  Socioeconomic History  . Marital status: Married    Spouse name: Not on file  . Number of children: Not on file  . Years of education: Not on file  . Highest education level: Not on file  Occupational History  . Not on file  Tobacco Use  . Smoking status: Never Smoker  . Smokeless tobacco: Never Used  Vaping Use  . Vaping Use: Never used  Substance and Sexual Activity  . Alcohol use: Not Currently    Comment: rare glass of wine  . Drug use: No  . Sexual activity: Not Currently    Birth control/protection: Post-menopausal  Other Topics Concern  . Not on file  Social History Narrative  . Not on file   Social Determinants of Health   Financial Resource Strain: Low Risk   . Difficulty of Paying Living Expenses: Not very hard  Food Insecurity: Not on file  Transportation Needs: Not on file  Physical Activity: Not on file  Stress: Not on file  Social Connections: Not on file  Intimate Partner Violence: Not on file   Family History  Problem Relation Age of Onset  . Stroke Mother   . Heart attack Mother   . Heart disease Mother   . Peripheral vascular disease Father        had stent in leg  . Colon cancer Father   . Atrial fibrillation Father        with pacemaker  . Thyroid disease Father   . Cancer  Father   . Hearing loss Father   . Hypertension Father   . Pancreatic cancer Brother   . Cancer Brother   . Hypertension Brother   . Coronary artery disease Brother        with Stent  . Cancer Brother   . Hypertension Brother   . Coronary artery disease Brother        with CABG in his 78's  . Hypertension Brother   . Thyroid disease Brother   . Hypertension Brother   . Thyroid disease Sister   . Esophageal cancer Neg Hx   . Inflammatory bowel disease Neg Hx   . Liver disease Neg Hx   . Stomach cancer Neg Hx    I have reviewed her medical, social, and family history in detail and updated the electronic medical record as necessary.    PHYSICAL EXAMINATION  BP 124/60 (BP Location: Left Arm, Patient Position: Sitting, Cuff Size: Normal)   Pulse 60   Ht _0  (1.626 m) Comment: height measured without shoes  Wt 149 lb 4 oz (67.7 kg)   BMI 25.62 kg/m  Wt Readings from Last 3 Encounters:  08/22/20 149 lb 4 oz (67.7 kg)  07/12/20 150 lb 6.4 oz (68.2 kg)  07/03/20 150 lb (68 kg)  GEN: NAD, appears stated age, doesn't appear chronically ill PSYCH: Cooperative, without pressured speech EYE: Conjunctivae pink, sclerae anicteric ENT: Masked CV: Nontachycardic RESP: No audible wheezing GI: NABS, soft, NT/ND, without rebound or guarding MSK/EXT: No lower extremity edema SKIN: No jaundice NEURO:  Alert & Oriented x 3, no focal deficits   REVIEW OF DATA  I reviewed the following data at the time of this encounter:  GI Procedures and Studies  December 2021 EUS EGD Impression: - No gross lesions in esophagus. Z-line at 40 cm. - Small 1 cm hiatal hernia. - Gastritis. Non-bleeding gastric ulcers with a clean ulcer base (Forrest Class III). Biopsied for HP. - Erythematous duodenopathy. Biopsied. - Normal ampulla.  EUS Impression: - Two cystic lesions were seen in the pancreatic head and pancreatic body (largest) - duct communication is felt to be present to both. FNA performed  of larger cyst (sent for CEA/Amylase/Cytology) results are pending. However, the endosonographic appearance is consistent with branched intraductal papillary mucinous neoplasm though bloody aspirate not completely consistent with this - query previous trauma noted at time of initial finding of these cysts earlier this year?. - The pancreatic duct and parenchyma had normal endosonographic appearance in the pancreatic head, genu of the pancreas, body of the pancreas and tail of the pancreas. - There was no sign of significant pathology in the common bile duct and in the common hepatic duct. - No malignant-appearing lymph nodes were visualized in the celiac region (level 20), peripancreatic region and porta hepatis region.  Pathology Specimen Submitted: A. PANCREATIC BODY CYST, FINE NEEDLE ASPIRATION:  FINAL MICROSCOPIC DIAGNOSIS:  - No malignant cells identified   [facility]  Cytology showed no evidence of malignant cells DNA quantity/quality-moderate quantity/good quality Oncogene point mutations-GMS negative, KRAS negative Tumor suppressor genes- not detected Pancreatic body cyst fluid CEA 5144 ng/mL Amylase 114 units/L  FINAL MICROSCOPIC DIAGNOSIS:  A. DUODENUM, BIOPSY:  - Mildly active chronic nonspecific duodenitis, see comment  - Negative for increased intraepithelial lymphocytes or villous  architectural changes  B. GASTRIC, BIOPSY:  - Gastric antral and oxyntic mucosa with patchy nonspecific reactive  gastropathy  - Warthin Starry stain is negative for Helicobacter pylori  3254 colonoscopy - The entire examined colon is normal.  Laboratory Studies  Reviewed those in epic  Imaging Studies  October 2021 MRI/MRCP IMPRESSION: 1. 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 Dominant lesion 2.3 cm stable from previous imaging and without nodular enhancement or other high-risk features. Six-month follow-up with MRI/MRCP with and without contrast is suggested.  Intraductal papillary mucinous neoplasm is considered for dominant lesion.   ASSESSMENT  Ms. Horsey is a 75 y.o. female with a pmh significant for CAD (status post PCI), CHF, hypertension, hyperlipidemia, allergies, arthritis, cataracts, family history colon cancer (brother), family history pancreatic cancer (brother), GERD, pancreatic cysts consistent with BD-IPMN.  The patient is seen today for evaluation and management of:  1. Pancreatic cyst   2. IPMN (intraductal papillary mucinous neoplasm)   3. Family history of pancreatic cancer   4. History of gastric ulcer   5. Family history of colon cancer    The patient is hemodynamically and clinically stable at this time.  The patient's findings were most significant for pancreatic cysts that were consistent with IPMN's.  The largest most likely a branch duct IPMN rather than main duct IPMN.  We briefly discussed the risks of pancreatic cysts progressing into pancreatic cancer.  Her lifetime risk is low in the setting of this being a branch duct IPMN but does require surveillance and follow-up.  To ensure that there are no other significant changes we recommend a 75-monthinterval MRI/MRCP.  If things are stable then the plan will be a 1 year follow-up thereafter due to the need for monitoring for a few years (5-year minimum as long as her life expectancy is felt to be as such).  Dr. AZenia Residesor I can perform these and monitor closely.  If there are any other significant changes in the future can consider repeat EUS/FNA but I do not suspect that will be the case.  Due to patient's family history of colon cancer, we will perform at least one final colonoscopy at this time.  We will  plan for this to prove performed at the same time as her planned follow-up for her gastric ulcer that was found during her endoscopic ultrasound.  She will remain on H2 RA therapy as result of her PPI allergy.  Hopefully we will see things are improved at which point we can decrease  her H2 RA therapy to daily.  The risks and benefits of endoscopic evaluation were discussed with the patient; these include but are not limited to the risk of perforation, infection, bleeding, missed lesions, lack of diagnosis, severe illness requiring hospitalization, as well as anesthesia and sedation related illnesses.  The patient is agreeable to proceed.  All patient questions were answered to the best of my ability, and the patient agrees to the aforementioned plan of action with follow-up as indicated.   PLAN  Surveillance EGD in next 1 to 2 months for follow-up of gastric ulcer Screening colonoscopy at time of EGD (high risk screening) Repeat MRI at 11-monthinterval from December 2021 -If all is well, we will plan 1 year MRI/MRCP thereafter -This will be ordered by Dr. AZenia Residesbut happy to be of assistance in future as necessary Patient will help get u[place] information or names or place in which we can try to give more information as to the reasoning for the interventions performed at time of EUS from an insurance perspective to try and help those because that may not have been covered as of yet-she will touch base with her insurance and then get back in touch with [place]  Orders Placed This Encounter  Procedures  . Ambulatory referral to Gastroenterology    New Prescriptions   NA SULFATE-K SULFATE-MG SULF (SUPREP BOWEL PREP KIT) 17.5-3.13-1.6 GM/177ML SOLN    Take 1 kit by mouth as directed. For colonoscopy prep   Modified Medications   No medications on file    Planned Follow Up No follow-ups on file.   Total Time in Face-to-Face and in Coordination of Care for patient including independent/personal interpretation/review of prior testing, medical history, examination, medication adjustment, communicating results with the patient directly, and documentation with the EHR is 30 minutes.   GJustice Britain MD [facility] Office # [phone]

## 2020-10-07 ENCOUNTER — Telehealth: Payer: Self-pay | Admitting: Gastroenterology

## 2020-10-07 NOTE — Telephone Encounter (Signed)
Pt is scheduled for endo colon on 10/24/20. She wants to cancel colon and only have egd. Pt stated that she thought about it and changed her mind about having colon.

## 2020-10-07 NOTE — Telephone Encounter (Signed)
Understood.  I think she is still a candidate for colon cancer screening but she certainly has the ability to make decisions about her health.  EGD for follow-up of ulcer disease and ensure healing is next step.  Thank you. GM

## 2020-10-07 NOTE — Telephone Encounter (Signed)
Dr  Rush Landmark the pt has an EGD and colon scheduled for 4/21. She has decided that she does not wish to proceed with colon because of her age and other health issues. She  will come in for EGD. I have cancelled to the colon portion per her request.

## 2020-10-11 ENCOUNTER — Other Ambulatory Visit: Payer: Self-pay | Admitting: Surgery

## 2020-10-11 DIAGNOSIS — D490 Neoplasm of unspecified behavior of digestive system: Secondary | ICD-10-CM

## 2020-10-17 NOTE — Progress Notes (Signed)
Chronic Care Management Pharmacy Note  10/21/2020 Name:  Kelli Roy MRN:  [id number] DOB:  1945/07/29  Subjective: Kelli Roy is an 75 y.o. year old female who is a primary patient of Eulogio Bear, NP.  The CCM team was consulted for assistance with disease management and care coordination needs.    Engaged with patient by telephone for follow up visit in response to provider referral for pharmacy case management and/or care coordination services.   Consent to Services:  The patient was given the following information about Chronic Care Management services today, agreed to services, and gave verbal consent: 1. CCM service includes personalized support from designated clinical staff supervised by the primary care provider, including individualized plan of care and coordination with other care providers 2. 24/7 contact phone numbers for assistance for urgent and routine care needs. 3. Service will only be billed when office clinical staff spend 20 minutes or more in a month to coordinate care. 4. Only one practitioner may furnish and bill the service in a calendar month. 5.The patient may stop CCM services at any time (effective at the end of the month) by phone call to the office staff. 6. The patient will be responsible for cost sharing (co-pay) of up to 20% of the service fee (after annual deductible is met). Patient agreed to services and consent obtained.  Patient Care Team: Eulogio Bear, NP as PCP - General (Nurse Practitioner) Paula Compton, MD as Consulting Physician (Obstetrics and Gynecology) Edythe Clarity, [facility] as Pharmacist (Pharmacist)  Recent office visits: 04/29/20 Dr. Buelah Manis for UTI STARTED Nitrofurantoin Monohyd Macro 100 mg for 4 days. 04/19/20 Dr. Buelah Manis General check up. Scheduled for a MRI of pancreas cyst. STOPPED Cephalexin. STARTED Eszopidone 1 mg before bedtime.  Recent consult visits: 08/22/20 ([place], Gertie Fey) - pancreatic cyst -  clinically stable, recommend 6 month follow up  Hospital visits: 07/03/20 Dr. Rush Landmark for pancreas cyst. Biopsy of a lesion was performed. (Upper Endoscopic) No medication changes.   Objective:  Lab Results  Component Value Date   CREATININE 0.82 07/09/2020   BUN 11 07/09/2020   GFR 95.98 06/06/2014   GFRNONAA 70 07/09/2020   GFRAA 81 07/09/2020   NA 141 07/09/2020   K 4.5 07/09/2020   CALCIUM 10.1 07/09/2020   CO2 25 07/09/2020   GLUCOSE 93 07/09/2020    Lab Results  Component Value Date/Time   HGBA1C 6.1 (H) 05/17/2014 12:50 PM   HGBA1C 6.1 (H) 04/27/2014 09:54 AM   GFR 95.98 06/06/2014 12:31 PM   GFR 82.68 04/06/2014 11:54 AM    Last diabetic Eye exam: No results found for: HMDIABEYEEXA  Last diabetic Foot exam: No results found for: HMDIABFOOTEX   Lab Results  Component Value Date   CHOL 150 07/09/2020   HDL 58 07/09/2020   LDLCALC 73 07/09/2020   TRIG 108 07/09/2020   CHOLHDL 2.6 07/09/2020    Hepatic Function Latest Ref Rng & Units 07/09/2020 04/19/2020 09/11/2019  Total Protein 6.0 - 8.5 g/dL 6.6 6.6 6.8  Albumin 3.7 - 4.7 g/dL 4.2 - 4.2  AST 0 - 40 IU/L '18 17 20  ' ALT 0 - 32 IU/L '17 11 16  ' Alk Phosphatase 44 - 121 IU/L 121 - 137(H)  Total Bilirubin 0.0 - 1.2 mg/dL 0.5 0.5 0.4  Bilirubin, Direct 0.00 - 0.40 mg/dL 0.13 - 0.10    Lab Results  Component Value Date/Time   TSH 2.50 01/03/2016 11:01 AM    CBC Latest  Ref Rng & Units 04/19/2020 05/06/2019 03/07/2019  WBC 3.8 - 10.8 Thousand/uL 5.8 8.0 4.6  Hemoglobin 11.7 - 15.5 g/dL 13.9 14.4 13.3  Hematocrit 35.0 - 45.0 % 42.6 44.3 39.5  Platelets 140 - 400 Thousand/uL 169 184 177    Lab Results  Component Value Date/Time   VD25OH 41 03/24/2017 11:17 AM   VD25OH 29 (L) 01/03/2016 11:01 AM    Clinical ASCVD: Yes  The ASCVD Risk score Mikey Bussing DC Jr., et al., 2013) failed to calculate for the following reasons:   The patient has a prior MI or stroke diagnosis    Depression screen [facility] 2/9 04/29/2020  04/19/2020 03/27/2019  Decreased Interest 0 0 1  Down, Depressed, Hopeless 0 0 0  PHQ - 2 Score 0 0 1  Altered sleeping - - -  Tired, decreased energy - - -  Change in appetite - - -  Feeling bad or failure about yourself  - - -  Trouble concentrating - - -  Moving slowly or fidgety/restless - - -  Suicidal thoughts - - -  PHQ-9 Score - - -  Difficult doing work/chores - - -  Some recent data might be hidden     Social History   Tobacco Use  Smoking Status Never Smoker  Smokeless Tobacco Never Used   BP Readings from Last 3 Encounters:  08/22/20 124/60  07/12/20 (!) 168/60  07/03/20 (!) 141/53   Pulse Readings from Last 3 Encounters:  08/22/20 60  07/12/20 (!) 57  07/03/20 (!) 54   Wt Readings from Last 3 Encounters:  08/22/20 149 lb 4 oz (67.7 kg)  07/12/20 150 lb 6.4 oz (68.2 kg)  07/03/20 150 lb (68 kg)   BMI Readings from Last 3 Encounters:  08/22/20 25.62 kg/m  07/12/20 25.03 kg/m  07/03/20 24.96 kg/m    Assessment/Interventions: Review of patient past medical history, allergies, medications, health status, including review of consultants reports, laboratory and other test data, was performed as part of comprehensive evaluation and provision of chronic care management services.   SDOH:  (Social Determinants of Health) assessments and interventions performed: Yes  SDOH Screenings   Alcohol Screen: Low Risk   . Last Alcohol Screening Score (AUDIT): 0  Depression (PHQ2-9): Low Risk   . PHQ-2 Score: 0  Financial Resource Strain: Low Risk   . Difficulty of Paying Living Expenses: Not very hard  Food Insecurity: Not on file  Housing: Not on file  Physical Activity: Not on file  Social Connections: Not on file  Stress: Not on file  Tobacco Use: Low Risk   . Smoking Tobacco Use: Never Smoker  . Smokeless Tobacco Use: Never Used  Transportation Needs: Not on file    CCM Care Plan  Allergies  Allergen Reactions  . Carvedilol Shortness Of Breath and  Other (See Comments)    Also caused fluid to accumulate around her heart; resulted in hospitalization  . Spironolactone Shortness Of Breath and Other (See Comments)    Also caused fluid to accumulate around her heart; resulted in hospitalization  . Demerol [Meperidine] Nausea Only  . Lipitor [Atorvastatin] Itching  . Lisinopril Cough  . Dexilant [Dexlansoprazole] Rash  . Latex Rash  . Neomycin Rash  . Plavix [Clopidogrel Bisulfate] Rash  . Sulfonamide Derivatives Rash  . Ticagrelor Itching and Rash    Brilinta     Medications Reviewed Today    Reviewed by Edythe Clarity, [facility] (Pharmacist) on 10/21/20 at 1513  Med List Status: <None>  Medication Order Taking? Sig Documenting Provider Last Dose Status Informant  Acetaminophen 500 MG capsule [id number] Yes Take 500 mg by mouth every 6 (six) hours as needed for moderate pain or headache.  [provider] Taking Active Self  Ascorbic Acid (VITAMIN C) 1000 MG tablet [id number] Yes Take 1,000 mg by mouth daily. [provider] Taking Active Self  aspirin EC 81 MG tablet [id number] Yes Take 81 mg by mouth at bedtime.  [provider] Taking Active Self  Cholecalciferol (VITAMIN D3) 50 MCG (2000 UT) capsule [id number] Yes Take 2,000 Units by mouth daily. [provider] Taking Active Self  Coenzyme Q10-Vitamin E (QUNOL ULTRA COQ10 PO) [id number] Yes Take 200 mg by mouth at bedtime. Liquid [provider] Taking Active Self  Cyanocobalamin (VITAMIN B12 PO) [id number] Yes Take 3,000 mcg by mouth daily. [provider] Taking Active Self  docusate sodium (COLACE) 100 MG capsule [id number] Yes Take 100-200 mg by mouth See admin instructions. Take 100 mg by mouth in the morning and 200 mg at bedtime [provider] Taking Active Self  eszopiclone (LUNESTA) 1 MG TABS tablet [id number] Yes Take immediately before bedtime  Patient taking differently: Take 0.5-1 mg by mouth at bedtime as needed for  sleep.   Buelah Manis, Modena Nunnery, MD Taking Active Self  famotidine (PEPCID) 20 MG tablet [id number] Yes Take 1 tablet (20 mg total) by mouth 2 (two) times daily. Mansouraty, Telford Nab., MD Taking Active   furosemide (LASIX) 20 MG tablet [id number] Yes TAKE 1 TABLET BY MOUTH EVERY DAY - MAY TAKE 1 ADDITIONAL TABLET AS NEEDED [place], Peter M, MD Taking Active   ipratropium (ATROVENT) 0.06 % nasal spray [id number] Yes Place 2 sprays into both nostrils daily as needed for rhinitis. [provider] Taking Active Self  loratadine (CLARITIN) 10 MG tablet [id number] Yes Take 10 mg by mouth daily as needed for allergies or rhinitis. [provider] Taking Active Self  losartan (COZAAR) 50 MG tablet [id number] Yes TAKE 1 TABLET BY MOUTH EVERY DAY  Patient taking differently: Take 50 mg by mouth at bedtime.   [place], Peter M, MD Taking Active Self  Magnesium Cl-Calcium Carbonate (SLOW-MAG PO) [id number] Yes Take 1 tablet by mouth 2 (two) times daily. [provider] Taking Active Self  Multiple Vitamins-Minerals (PRESERVISION/LUTEIN PO) [id number] Yes Take by mouth. [provider] Taking Active   Na Sulfate-K Sulfate-Mg Sulf (SUPREP BOWEL PREP KIT) 17.5-3.13-1.6 GM/177ML SOLN [id number] Yes Take 1 kit by mouth as directed. For colonoscopy prep Mansouraty, Telford Nab., MD Taking Active   nitroGLYCERIN (NITROSTAT) 0.4 MG SL tablet [id number] Yes Place 1 tablet (0.4 mg total) under the tongue every 5 (five) minutes as needed for chest pain. Martin[place], Peter M, MD Taking Active            Med Note Rosana Hoes, SOPHIA A   Thu Aug 22, 2020  3:16 PM) On hand  rosuvastatin (CRESTOR) 40 MG tablet [id number]  Take 1 tablet (40 mg total) by mouth daily.  Patient taking differently: Take 40 mg by mouth at bedtime.   Alycia Rossetti, MD  Expired 08/05/20 2359 Self  tetrahydrozoline-zinc (VISINE-AC) 0.05-0.25 % ophthalmic solution [id number] Yes Place 2 drops into both eyes daily as needed (for dry or  irritated eyes).  [provider] Taking Active Self  trolamine salicylate (ASPERCREME/ALOE) 10 % cream [id number] Yes Apply 1 application topically daily as needed (bursitis). [provider] Taking Active Self  Patient Active Problem List   Diagnosis Date Noted  . Pancreatic cyst 08/26/2020  . IPMN (intraductal papillary mucinous neoplasm) 08/26/2020  . Family history of pancreatic cancer 08/26/2020  . History of gastric ulcer 08/26/2020  . Family history of colon cancer 08/26/2020  . Shingles 09/18/2019  . DDD (degenerative disc disease), thoracolumbar 09/13/2019  . Osteopenia 04/29/2017  . Allergic rhinitis 03/24/2017  . Bradycardia 03/24/2017  . Constipation 07/05/2015  . Hemorrhoids 07/04/2015  . Chronic systolic CHF (congestive heart failure) ([place]) 07/26/2014  . 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 Hyperlipidemia 03/12/2014  . CAD (coronary artery disease), native coronary artery 03/12/2014  . Cardiomyopathy, ischemic 03/12/2014  . Acute on chronic combined systolic (EF 40-08%) and diastolic heart failure (NYHA 2) 03/09/2014  . ST elevation myocardial infarction (STEMI) involving left anterior descending (LAD) coronary artery with complication ([place]) 67/61/9509  . DYSPNEA 07/03/2009  . Essential hypertension 06/19/2009  . Insomnia 06/19/2009    Immunization History  Administered Date(s) Administered  . Fluad Quad(high Dose 65+) 03/27/2019, 04/19/2020  . Influenza,inj,Quad PF,6+ Mos 06/06/2014, 07/04/2015, 07/14/2016, 03/24/2017, 04/01/2018  . Pneumococcal Conjugate-13 07/04/2015  . Pneumococcal Polysaccharide-23 07/04/2014  . Td 02/08/2012    Conditions to be addressed/monitored:  HTN, HF, CAD, Allergic Rhinitis, Osteopenia, HLD, Insomnia.  Care Plan : General Pharmacy (Adult)  Updates made by Edythe Clarity, RPH since 10/21/2020 12:00 AM    Problem: HTN, HLD, HF, Osteopenia   Priority: High  Onset Date: 10/21/2020  Note:   Current Barriers:   . No specific barriers at this time,.   Pharmacist Clinical Goal(s):  Marland Kitchen Patient will achieve adherence to monitoring guidelines and medication adherence to achieve therapeutic efficacy . maintain control of blood pressure as evidenced by home monitoring  . contact provider office for questions/concerns as evidenced notation of same in electronic health record through collaboration with PharmD and provider.   Interventions: . 1:1 collaboration with Buelah Manis, Modena Nunnery, MD regarding development and update of comprehensive plan of care as evidenced by provider attestation and co-signature . Inter-disciplinary care team collaboration (see longitudinal plan of care) . Comprehensive medication review performed; medication list updated in electronic medical record  Hypertension (BP goal <130/80) -Controlled -Current treatment: . Losartan 27m daily -Medications previously tried: lisinopril (cough)  -Current home readings: 111/64, 115/49, 137/50, 152/81  -Denies hypotensive/hypertensive symptoms -Educated on BP goals and benefits of medications for prevention of heart attack, stroke and kidney damage; Importance of home blood pressure monitoring; Symptoms of hypotension and importance of maintaining adequate hydration; -Counseled to monitor BP at home daily, document, and provide log at future appointments -Recommended to continue current medication Recommended contacting providers if BP consistently > 130/80  Hyperlipidemia: (LDL goal < 70) -Controlled -Current treatment: . Rosuvastatin 423m-Medications previously tried: none noted   -Educated on Cholesterol goals;  Benefits of statin for ASCVD risk reduction; Importance of limiting foods high in cholesterol; -Recommended to continue current medication  Heart Failure (Goal: manage symptoms and prevent exacerbations) -Controlled -Last ejection fraction: 2019 - 4932%HF type: Systolic -Current treatment: . Furosemide 2069mdaily -Medications previously tried: none noted -Current home BP/HR readings: see above -Current dietary habits: limiting salt  -Educated on Benefits of medications for managing symptoms and prolonging life Importance of weighing daily; if you gain more than 3 pounds in one day or 5 pounds in one week, contact providers Importance of blood pressure control -Recommended to continue current medication  Osteopenia (Goal prevent fractures minimize falls) -Controlled -Last DEXA Scan: November  2020  T-Score femoral neck: -2.1  (this is an improvement from 2018)   -Patient is not a candidate for pharmacologic treatment -Current treatment  . Multivitamin -Medications previously tried: none noted  -Recommend [phone] units of vitamin D daily. Recommend 1200 mg of calcium daily from dietary and supplemental sources. Recommend repeat bone density scan November 2022 -Recommended to continue current medication    Patient Goals/Self-Care Activities . Patient will:  - take medications as prescribed check blood pressure daily, document, and provide at future appointments weigh daily, and contact provider if weight gain of 3 lbs in one day or 5 lbs in one week  Follow Up Plan: The care management team will reach out to the patient again over the next 180 days.        Medication Assistance: None required.  Patient affirms current coverage meets needs.  Patient's preferred pharmacy is:  [id number] [place] [id number]: [phone]: [phone] Uses pill box? Yes Pt endorses 100% compliance  We discussed: Benefits of medication synchronization, packaging and delivery as well as enhanced pharmacist oversight with [employer]. Patient decided to: Continue current medication management strategy  Care Plan and Follow Up Patient Decision:  Patient agrees to Care Plan and Follow-up.  Plan: The care  management team will reach out to the patient again over the next 180 days.  CBeverly Milch PharmD Clinical Pharmacist [place]([phone]

## 2020-10-21 ENCOUNTER — Ambulatory Visit (INDEPENDENT_AMBULATORY_CARE_PROVIDER_SITE_OTHER): Payer: PPO | Admitting: Pharmacist

## 2020-10-21 DIAGNOSIS — E785 Hyperlipidemia, unspecified: Secondary | ICD-10-CM

## 2020-10-21 DIAGNOSIS — I10 Essential (primary) hypertension: Secondary | ICD-10-CM | POA: Diagnosis not present

## 2020-10-21 DIAGNOSIS — I5022 Chronic systolic (congestive) heart failure: Secondary | ICD-10-CM | POA: Diagnosis not present

## 2020-10-21 DIAGNOSIS — M8589 Other specified disorders of bone density and structure, multiple sites: Secondary | ICD-10-CM

## 2020-10-21 NOTE — Patient Instructions (Addendum)
Visit Information  Goals Addressed            This Visit's Progress   . Track and Manage Fluids and Swelling-Heart Failure       Timeframe:  Long-Range Goal Priority:  High Start Date:    10/21/20                         Expected End Date:   04/22/21                    Follow Up Date 02/02/21   - call office if I gain more than 2 pounds in one day or 5 pounds in one week - keep legs up while sitting - use salt in moderation - watch for swelling in feet, ankles and legs every day    Why is this important?    It is important to check your weight daily and watch how much salt and liquids you have.   It will help you to manage your heart failure.    Notes:       Patient Care Plan: General Pharmacy (Adult)    Problem Identified: HTN, HLD, HF, Osteopenia   Priority: High  Onset Date: 10/21/2020  Note:   Current Barriers:  . No specific barriers at this time,.   Pharmacist Clinical Goal(s):  Marland Kitchen Patient will achieve adherence to monitoring guidelines and medication adherence to achieve therapeutic efficacy . maintain control of blood pressure as evidenced by home monitoring  . contact provider office for questions/concerns as evidenced notation of same in electronic health record through collaboration with PharmD and provider.   Interventions: . 1:1 collaboration with Buelah Manis, Modena Nunnery, MD regarding development and update of comprehensive plan of care as evidenced by provider attestation and co-signature . Inter-disciplinary care team collaboration (see longitudinal plan of care) . Comprehensive medication review performed; medication list updated in electronic medical record  Hypertension (BP goal <130/80) -Controlled -Current treatment: . Losartan 50mg  daily -Medications previously tried: lisinopril (cough)  -Current home readings: 111/64, 115/49, 137/50, 152/81  -Denies hypotensive/hypertensive symptoms -Educated on BP goals and benefits of medications for  prevention of heart attack, stroke and kidney damage; Importance of home blood pressure monitoring; Symptoms of hypotension and importance of maintaining adequate hydration; -Counseled to monitor BP at home daily, document, and provide log at future appointments -Recommended to continue current medication Recommended contacting providers if BP consistently > 130/80  Hyperlipidemia: (LDL goal < 70) -Controlled -Current treatment: . Rosuvastatin 40mg  -Medications previously tried: none noted   -Educated on Cholesterol goals;  Benefits of statin for ASCVD risk reduction; Importance of limiting foods high in cholesterol; -Recommended to continue current medication  Heart Failure (Goal: manage symptoms and prevent exacerbations) -Controlled -Last ejection fraction: 2019 - 91% -HF type: Systolic -Current treatment: . Furosemide 20mg  daily -Medications previously tried: none noted -Current home BP/HR readings: see above -Current dietary habits: limiting salt  -Educated on Benefits of medications for managing symptoms and prolonging life Importance of weighing daily; if you gain more than 3 pounds in one day or 5 pounds in one week, contact providers Importance of blood pressure control -Recommended to continue current medication  Osteopenia (Goal prevent fractures minimize falls) -Controlled -Last DEXA Scan: November 2020  T-Score femoral neck: -2.1  (this is an improvement from 2018)   -Patient is not a candidate for pharmacologic treatment -Current treatment  . Multivitamin -Medications previously tried: none noted  -Recommend [phone] units of vitamin  D daily. Recommend 1200 mg of calcium daily from dietary and supplemental sources. Recommend repeat bone density scan November 2022 -Recommended to continue current medication    Patient Goals/Self-Care Activities . Patient will:  - take medications as prescribed check blood pressure daily, document, and provide at future  appointments weigh daily, and contact provider if weight gain of 3 lbs in one day or 5 lbs in one week  Follow Up Plan: The care management team will reach out to the patient again over the next 180 days.        The patient verbalized understanding of instructions, educational materials, and care plan provided today and agreed to receive a mailed copy of patient instructions, educational materials, and care plan.  Telephone follow up appointment with pharmacy team member scheduled for: 6 months  Edythe Clarity, [facility]  Heart Failure, Self-Care Heart failure is a serious condition. The following information explains the things you need to do to take care of yourself after a heart failure diagnosis. You may be asked to change your diet, take certain medicines, and make other lifestyle changes in order to stay as healthy as possible. Your health care provider may also give you more specific instructions. If you have problems or questions, contact your health care provider. What are the risks? Having heart failure puts you at higher risk for certain problems. These problems can get worse if you do not take good care of yourself. Problems may include:  Damage to the kidneys, liver, or lungs.  Malnutrition.  Abnormal heart rhythms.  Blood clotting issues that could cause a stroke. Supplies needed:  Scale for monitoring weight.  Blood pressure monitor.  Notebook.  Medicines. How to care for yourself when you have heart failure Medicines Take over-the-counter and prescription medicines only as told by your health care provider. Medicines reduce the workload of your heart, slow the progression of heart failure, and improve symptoms. Take your medicines every day.  Do not stop taking your medicine unless your health care provider tells you to do so.  Do not skip any dose of medicine.  Refill your prescriptions before you run out of medicine.  Talk with your health care provider if  you cannot afford your medicines. Eating and drinking  Eat heart-healthy foods. Talk with a dietitian to make an eating plan that is right for you. ? Limit salt (sodium) if told by your health care provider. Sodium restriction may reduce symptoms of heart failure. Ask a dietitian to recommend heart-healthy seasonings. ? Use healthy cooking methods instead of frying. Healthy methods include roasting, grilling, broiling, baking, poaching, steaming, and stir-frying. ? Choose foods that contain no trans fat and are low in saturated fat and cholesterol. Healthy choices include fresh or frozen fruits and vegetables, fish, lean meats, legumes, fat-free or low-fat dairy products, and whole-grain or high-fiber foods.  Limit your fluid intake, if directed by your health care provider. Fluid restriction may reduce symptoms of heart failure.   Alcohol use  Do not drink alcohol if: ? Your health care provider tells you not to drink. ? Your heart was damaged by alcohol, or you have severe heart failure. ? You are pregnant, may be pregnant, or are planning to become pregnant.  If you drink alcohol: ? Limit how much you have to:  0-1 drink a day for women.  0-2 drinks a day for men. ? Know how much alcohol is in your drink. In the [place] one drink equals one 12 oz bottle  of beer (355 mL), one 5 oz glass of wine (148 mL), or one 1 oz glass of hard liquor (44 mL). Lifestyle  Do not use any products that contain nicotine or tobacco. These products include cigarettes, chewing tobacco, and vaping devices, such as e-cigarettes. If you need help quitting, ask your health care provider. ? Do not use nicotine gum or patches before talking to your health care provider.  Do not use illegal drugs.  Work with your health care provider to safely reach the right body weight.  Do physical activity if told by your health care provider. Talk to your health care provider before you begin an exercise if: ? You are an  older adult. ? You have severe heart failure.  Learn to manage stress. If you need help to do this, ask your health care provider.  Participate in or seek physical rehabilitation as needed to keep or improve your independence and quality of life.  Participate in a cardiac rehabilitation program, which is a treatment program to improve your health and well-being through exercise training, education, and counseling.  Plan rest periods when you get tired.   Monitoring important information  Weigh yourself every day. This will help you to notice if too much fluid is building up in your body. ? Weigh yourself every morning after you urinate and before you eat breakfast. ? Wear the same amount of clothing each time you weigh yourself. ? Record your daily weight. Provide your health care provider with your weight record.  Monitor and record your pulse and blood pressure as told by your health care provider.   Dealing with extreme temperatures  If the weather is extremely hot: ? Avoid vigorous physical activity. ? Use air conditioning or fans, or find a cooler location. ? Avoid caffeine and alcohol. ? Wear loose-fitting, lightweight, and light-colored clothing.  If the weather is extremely cold: ? Avoid vigorous activity. ? Layer your clothes. ? Wear mittens or gloves, a hat, and a face covering when you go outside. ? Avoid alcohol. Follow these instructions at home:  Stay up to date with vaccines. Pneumococcal and flu (influenza) vaccines are especially important in preventing infections of the airways.  Keep all follow-up visits. This is important. Contact a health care provider if you:  Gain 2-3 lb (1-1.4 kg) in 24 hours or 5 lb (2.3 kg) in a week.  Have increasing shortness of breath.  Are unable to participate in your usual physical activities.  Get tired easily.  Cough more than normal, especially with physical activity.  Lose your appetite or feel nauseous.  Have any  swelling or more swelling in areas such as your hands, feet, ankles, or abdomen.  Are unable to sleep because it is hard to breathe.  Feel like your heart is beating quickly (palpitations).  Become dizzy or light-headed when you stand up.  Have feelings of depression or sadness. Get help right away if you:  Have trouble breathing.  Notice, or your family notices, a change in your awareness, such as having trouble staying awake or concentrating.  Have pain or discomfort in your chest.  Have an episode of fainting (syncope). These symptoms may represent a serious problem that is an emergency. Do not wait to see if the symptoms will go away. Get medical help right away. Call your local emergency services (911 in the [place] Do not drive yourself to the hospital. Summary  Heart failure is a serious condition. To care for yourself, you may be  asked to change your diet, take certain medicines, and make other lifestyle changes.  Take your medicines every day. Do not stop taking them unless your health care provider tells you to do so.  Limit salt and eat heart-healthy foods, such as fresh or frozen fruits and vegetables, fish, lean meats, legumes, fat-free or low-fat dairy products, and whole-grain or high-fiber foods.  Ask your health care provider if you have any alcohol restrictions. You may have to stop drinking alcohol if you have severe heart failure.  Contact your health care provider if you notice problems, such as rapid weight gain or a fast heartbeat. Get help right away if you faint or have chest pain or trouble breathing. This information is not intended to replace advice given to you by your health care provider. Make sure you discuss any questions you have with your health care provider. Document Revised: 01/13/2020 Document Reviewed: 01/13/2020 Elsevier Patient Education  [place].

## 2020-10-22 DIAGNOSIS — Z1159 Encounter for screening for other viral diseases: Secondary | ICD-10-CM | POA: Diagnosis not present

## 2020-10-24 ENCOUNTER — Other Ambulatory Visit: Payer: Self-pay

## 2020-10-24 ENCOUNTER — Ambulatory Visit (AMBULATORY_SURGERY_CENTER): Payer: PPO | Admitting: Gastroenterology

## 2020-10-24 ENCOUNTER — Encounter: Payer: Self-pay | Admitting: Gastroenterology

## 2020-10-24 VITALS — BP 134/57 | HR 45 | Temp 97.4°F | Resp 50 | Ht 64.0 in | Wt 149.0 lb

## 2020-10-24 DIAGNOSIS — K259 Gastric ulcer, unspecified as acute or chronic, without hemorrhage or perforation: Secondary | ICD-10-CM | POA: Diagnosis not present

## 2020-10-24 DIAGNOSIS — Z09 Encounter for follow-up examination after completed treatment for conditions other than malignant neoplasm: Secondary | ICD-10-CM | POA: Diagnosis not present

## 2020-10-24 DIAGNOSIS — K862 Cyst of pancreas: Secondary | ICD-10-CM

## 2020-10-24 DIAGNOSIS — Z8719 Personal history of other diseases of the digestive system: Secondary | ICD-10-CM

## 2020-10-24 DIAGNOSIS — K449 Diaphragmatic hernia without obstruction or gangrene: Secondary | ICD-10-CM

## 2020-10-24 DIAGNOSIS — K2950 Unspecified chronic gastritis without bleeding: Secondary | ICD-10-CM | POA: Diagnosis not present

## 2020-10-24 MED ORDER — SODIUM CHLORIDE 0.9 % IV SOLN: 500.0000 mL | Freq: Once | INTRAVENOUS | Status: DC

## 2020-10-24 NOTE — Patient Instructions (Signed)
Information on hiatal hernias and gastritis given to you today.  Await pathology results.  Resume previous diet and medications.  Continue H2RA therapy for now while remaining on aspirin to allow healing of the stomach lining.  YOU HAD AN ENDOSCOPIC PROCEDURE TODAY AT [facility]:   Refer to the procedure report that was given to you for any specific questions about what was found during the examination.  If the procedure report does not answer your questions, please call your gastroenterologist to clarify.  If you requested that your care partner not be given the details of your procedure findings, then the procedure report has been included in a sealed envelope for you to review at your convenience later.  YOU SHOULD EXPECT: Some feelings of bloating in the abdomen. Passage of more gas than usual.  Walking can help get rid of the air that was put into your GI tract during the procedure and reduce the bloating. If you had a lower endoscopy (such as a colonoscopy or flexible sigmoidoscopy) you may notice spotting of blood in your stool or on the toilet paper. If you underwent a bowel prep for your procedure, you may not have a normal bowel movement for a few days.  Please Note:  You might notice some irritation and congestion in your nose or some drainage.  This is from the oxygen used during your procedure.  There is no need for concern and it should clear up in a day or so.  SYMPTOMS TO REPORT IMMEDIATELY:   Following upper endoscopy (EGD)  Vomiting of blood or coffee ground material  New chest pain or pain under the shoulder blades  Painful or persistently difficult swallowing  New shortness of breath  Fever of 100F or higher  Black, tarry-looking stools  For urgent or emergent issues, a gastroenterologist can be reached at any hour by calling [phone]. Do not use MyChart messaging for urgent concerns.    DIET:  We do recommend a small meal at first, but then  you may proceed to your regular diet.  Drink plenty of fluids but you should avoid alcoholic beverages for 24 hours.  ACTIVITY:  You should plan to take it easy for the rest of today and you should NOT DRIVE or use heavy machinery until tomorrow (because of the sedation medicines used during the test).    FOLLOW UP: Our staff will call the number listed on your records 48-72 hours following your procedure to check on you and address any questions or concerns that you may have regarding the information given to you following your procedure. If we do not reach you, we will leave a message.  We will attempt to reach you two times.  During this call, we will ask if you have developed any symptoms of COVID 19. If you develop any symptoms (ie: fever, flu-like symptoms, shortness of breath, cough etc.) before then, please call [phone].  If you test positive for Covid 19 in the 2 weeks post procedure, please call and report this information to [place].    If any biopsies were taken you will be contacted by phone or by letter within the next 1-3 weeks.  Please call us at [phone] if you have not heard about the biopsies in 3 weeks.    SIGNATURES/CONFIDENTIALITY: You and/or your care partner have signed paperwork which will be entered into your electronic medical record.  These signatures attest to the fact that that the information above on your After  Visit Summary has been reviewed and is understood.  Full responsibility of the confidentiality of this discharge information lies with you and/or your care-partner. 

## 2020-10-24 NOTE — Progress Notes (Signed)
To PACU, VSS. Report to [email or web address] 

## 2020-10-24 NOTE — Op Note (Signed)
[name] Patient Name: Kelli Roy Procedure Date: 10/24/2020 1:34 PM MRN: [id number] Endoscopist: Justice Britain , MD Age: 75 Referring MD:  Date of Birth: [birth year] Gender: Female Account #: [id number] Procedure:                Upper GI endoscopy Indications:              Follow-up of gastric ulcer Medicines:                Monitored Anesthesia Care Procedure:                Pre-Anesthesia Assessment:                           - Prior to the procedure, a History and Physical                            was performed, and patient medications and                            allergies were reviewed. The patient's tolerance of                            previous anesthesia was also reviewed. The risks                            and benefits of the procedure and the sedation                            options and risks were discussed with the patient.                            All questions were answered, and informed consent                            was obtained. Prior Anticoagulants: The patient has                            taken no previous anticoagulant or antiplatelet                            agents except for aspirin. ASA Grade Assessment:                            III - A patient with severe systemic disease. After                            reviewing the risks and benefits, the patient was                            deemed in satisfactory condition to undergo the                            procedure.  After obtaining informed consent, the endoscope was                            passed under direct vision. Throughout the                            procedure, the patient's blood pressure, pulse, and                            oxygen saturations were monitored continuously. The                            Endoscope was introduced through the mouth, and                            advanced to the third part of duodenum. The upper                             GI endoscopy was accomplished without difficulty.                            The patient tolerated the procedure. Scope In: Scope Out: Findings:                 No gross lesions were noted in the entire esophagus.                           The Z-line was regular and was found 37 cm from the                            incisors.                           A 2 cm hiatal hernia was present.                           Patchy minimal inflammation characterized by                            erosions, friability and granularity was found in                            the gastric body and in the gastric antrum.                           Small healed ulcer scars were found in the gastric                            body and in the gastric antrum.                           No other gross lesions were noted in the entire  examined stomach. Biopsies were taken with a cold                            forceps for histology and Helicobacter pylori                            testing.                           No gross lesions were noted in the duodenal bulb,                            in the first portion of the duodenum and in the                            second portion of the duodenum. Complications:            No immediate complications. Estimated Blood Loss:     Estimated blood loss was minimal. Impression:               - No gross lesions in esophagus.                           - Z-line regular, 37 cm from the incisors.                           - 2 cm hiatal hernia.                           - Minimal gastritis.                           - Scars noted from healed ulcers.                           - No other gross lesions in the stomach. Biopsied.                           - No gross lesions in the duodenal bulb, in the                            first portion of the duodenum and in the second                            portion of the  duodenum. Recommendation:           - The patient will be observed post-procedure,                            until all discharge criteria are met.                           - Discharge patient to home.                           - Patient has a contact number  available for                            emergencies. The signs and symptoms of potential                            delayed complications were discussed with the                            patient. Return to normal activities tomorrow.                            Written discharge instructions were provided to the                            patient.                           - Resume previous diet.                           - Continue H2RA therapy for now while remaining on                            Aspirin to allow healing of stomach lining (though                            significantly improved at this time).                           - Consider repeat Colonoscopy for Colon Cancer                            screening in future, when patient is ready.                           - The findings and recommendations were discussed                            with the patient.                           - The findings and recommendations were discussed                            with the patient's family. Justice Britain, MD 10/24/2020 1:56:23 PM

## 2020-10-24 NOTE — Progress Notes (Signed)
Called to room to assist during endoscopic procedure.  Patient ID and intended procedure confirmed with present staff. Received instructions for my participation in the procedure from the performing physician.  

## 2020-10-28 ENCOUNTER — Telehealth: Payer: Self-pay | Admitting: *Deleted

## 2020-10-28 NOTE — Telephone Encounter (Signed)
  Follow up Call-  Call back number 10/24/2020  Post procedure Call Back phone  # [phone]  Permission to leave phone message Yes  Some recent data might be hidden     Patient questions:  Do you have a fever, pain , or abdominal swelling? No. Pain Score  0 *  Have you tolerated food without any problems? Yes.    Have you been able to return to your normal activities? Yes.    Do you have any questions about your discharge instructions: Diet   No. Medications  No. Follow up visit  No.  Do you have questions or concerns about your Care? No.  Actions: * If pain score is 4 or above: 1. No action needed, pain <4.Have you developed a fever since your procedure? no  2.   Have you had an respiratory symptoms (SOB or cough) since your procedure? no  3.   Have you tested positive for COVID 19 since your procedure no  4.   Have you had any family members/close contacts diagnosed with the COVID 19 since your procedure?  no   If yes to any of these questions please route to Joylene John, RN and Joella Prince, RN

## 2020-11-04 ENCOUNTER — Other Ambulatory Visit: Payer: Self-pay

## 2020-11-04 ENCOUNTER — Ambulatory Visit
Admission: RE | Admit: 2020-11-04 | Discharge: 2020-11-04 | Disposition: A | Discharge status: Home / Self Care | Payer: PPO | Source: Ambulatory Visit | Attending: Surgery | Admitting: Surgery

## 2020-11-04 DIAGNOSIS — K862 Cyst of pancreas: Secondary | ICD-10-CM | POA: Diagnosis not present

## 2020-11-04 DIAGNOSIS — D490 Neoplasm of unspecified behavior of digestive system: Secondary | ICD-10-CM

## 2020-11-04 MED ORDER — GADOBUTROL 1 MMOL/ML IV SOLN
6.0000 mL | Freq: Once | INTRAVENOUS | Status: AC | PRN
  Administered 2020-11-04: 6 mL via INTRAVENOUS

## 2020-11-07 ENCOUNTER — Encounter: Payer: Self-pay | Admitting: Gastroenterology

## 2020-12-04 DIAGNOSIS — D490 Neoplasm of unspecified behavior of digestive system: Secondary | ICD-10-CM | POA: Diagnosis not present

## 2020-12-04 DIAGNOSIS — K625 Hemorrhage of anus and rectum: Secondary | ICD-10-CM | POA: Diagnosis not present

## 2020-12-28 ENCOUNTER — Other Ambulatory Visit: Payer: Self-pay | Admitting: Gastroenterology

## 2021-01-01 DIAGNOSIS — Z86018 Personal history of other benign neoplasm: Secondary | ICD-10-CM | POA: Diagnosis not present

## 2021-01-01 DIAGNOSIS — Z85828 Personal history of other malignant neoplasm of skin: Secondary | ICD-10-CM | POA: Diagnosis not present

## 2021-01-01 DIAGNOSIS — L821 Other seborrheic keratosis: Secondary | ICD-10-CM | POA: Diagnosis not present

## 2021-01-01 DIAGNOSIS — D225 Melanocytic nevi of trunk: Secondary | ICD-10-CM | POA: Diagnosis not present

## 2021-01-01 DIAGNOSIS — L578 Other skin changes due to chronic exposure to nonionizing radiation: Secondary | ICD-10-CM | POA: Diagnosis not present

## 2021-01-08 DIAGNOSIS — M5116 Intervertebral disc disorders with radiculopathy, lumbar region: Secondary | ICD-10-CM | POA: Diagnosis not present

## 2021-01-08 DIAGNOSIS — M5441 Lumbago with sciatica, right side: Secondary | ICD-10-CM | POA: Diagnosis not present

## 2021-01-08 DIAGNOSIS — M25551 Pain in right hip: Secondary | ICD-10-CM | POA: Diagnosis not present

## 2021-01-08 DIAGNOSIS — M4726 Other spondylosis with radiculopathy, lumbar region: Secondary | ICD-10-CM | POA: Diagnosis not present

## 2021-01-08 DIAGNOSIS — M5117 Intervertebral disc disorders with radiculopathy, lumbosacral region: Secondary | ICD-10-CM | POA: Diagnosis not present

## 2021-01-08 DIAGNOSIS — I10 Essential (primary) hypertension: Secondary | ICD-10-CM | POA: Diagnosis not present

## 2021-01-10 ENCOUNTER — Telehealth: Payer: Self-pay | Admitting: Pharmacist

## 2021-01-10 NOTE — Progress Notes (Addendum)
Chronic Care Management Pharmacy Assistant   Name: Kelli Roy  MRN: [id number] DOB: Jan 11, 1946  Reason for Encounter: Disease State For HTN.   Conditions to be addressed/monitored: HTN, HF, CAD, Allergic Rhinitis, Osteopenia, HLD, Insomnia.  Recent office visits:  None since 10/21/20  Recent consult visits:  01/08/21 Family Med. Kari Baars, [place] For pain. No information given.  Hospital visits:  None since 10/21/20  Medications: Outpatient Encounter Medications as of 01/10/2021  Medication Sig Note   Acetaminophen 500 MG capsule Take 500 mg by mouth every 6 (six) hours as needed for moderate pain or headache.     Ascorbic Acid (VITAMIN C) 1000 MG tablet Take 1,000 mg by mouth daily.    aspirin EC 81 MG tablet Take 81 mg by mouth at bedtime.     Cholecalciferol (VITAMIN D3) 50 MCG (2000 UT) capsule Take 2,000 Units by mouth daily.    Coenzyme Q10-Vitamin E (QUNOL ULTRA COQ10 PO) Take 200 mg by mouth at bedtime. Liquid    Cyanocobalamin (VITAMIN B12 PO) Take 3,000 mcg by mouth daily.    docusate sodium (COLACE) 100 MG capsule Take 100-200 mg by mouth See admin instructions. Take 100 mg by mouth in the morning and 200 mg at bedtime    eszopiclone (LUNESTA) 1 MG TABS tablet Take immediately before bedtime (Patient taking differently: Take 0.5-1 mg by mouth at bedtime as needed for sleep.)    famotidine (PEPCID) 20 MG tablet TAKE 1 TABLET BY MOUTH TWICE A DAY    furosemide (LASIX) 20 MG tablet TAKE 1 TABLET BY MOUTH EVERY DAY - MAY TAKE 1 ADDITIONAL TABLET AS NEEDED    ipratropium (ATROVENT) 0.06 % nasal spray Place 2 sprays into both nostrils daily as needed for rhinitis.    loratadine (CLARITIN) 10 MG tablet Take 10 mg by mouth daily as needed for allergies or rhinitis.    losartan (COZAAR) 50 MG tablet TAKE 1 TABLET BY MOUTH EVERY DAY (Patient taking differently: Take 50 mg by mouth at bedtime.)    Magnesium Cl-Calcium Carbonate (SLOW-MAG PO) Take 1 tablet by mouth 2 (two) times  daily.    Multiple Vitamins-Minerals (PRESERVISION/LUTEIN PO) Take by mouth.    mupirocin ointment (BACTROBAN) 2 % 3 (three) times daily as needed.    Na Sulfate-K Sulfate-Mg Sulf (SUPREP BOWEL PREP KIT) 17.5-3.13-1.6 GM/177ML SOLN Take 1 kit by mouth as directed. For colonoscopy prep (Patient not taking: Reported on 10/24/2020)    nitroGLYCERIN (NITROSTAT) 0.4 MG SL tablet Place 1 tablet (0.4 mg total) under the tongue every 5 (five) minutes as needed for chest pain. (Patient not taking: Reported on 10/24/2020) 08/22/2020: On hand   rosuvastatin (CRESTOR) 40 MG tablet Take 1 tablet (40 mg total) by mouth daily. (Patient taking differently: Take 40 mg by mouth at bedtime.)    tetrahydrozoline-zinc (VISINE-AC) 0.05-0.25 % ophthalmic solution Place 2 drops into both eyes daily as needed (for dry or irritated eyes).     trolamine salicylate (ASPERCREME/ALOE) 10 % cream Apply 1 application topically daily as needed (bursitis).    No facility-administered encounter medications on file as of 01/10/2021.   Reviewed chart prior to disease state call. Spoke with patient regarding BP  Recent Office Vitals: BP Readings from Last 3 Encounters:  10/24/20 (!) 134/57  08/22/20 124/60  07/12/20 (!) 168/60   Pulse Readings from Last 3 Encounters:  10/24/20 (!) 45  08/22/20 60  07/12/20 (!) 57    Wt Readings from Last 3 Encounters:  10/24/20 149 lb (  67.6 kg)  08/22/20 149 lb 4 oz (67.7 kg)  07/12/20 150 lb 6.4 oz (68.2 kg)     Kidney Function Lab Results  Component Value Date/Time   CREATININE 0.82 07/09/2020 10:22 AM   CREATININE 0.87 04/19/2020 03:36 PM   CREATININE 0.84 06/09/2019 09:13 AM   CREATININE 0.90 04/01/2018 10:48 AM   GFR 95.98 06/06/2014 12:31 PM   GFRNONAA 70 07/09/2020 10:22 AM   GFRNONAA 63 04/01/2018 10:48 AM   GFRAA 81 07/09/2020 10:22 AM   GFRAA 74 04/01/2018 10:48 AM    BMP Latest Ref Rng & Units 07/09/2020 04/19/2020 06/09/2019  Glucose 65 - 99 mg/dL 93 93 89  BUN 8 - 27  mg/dL _0 Creatinine 0.57 - 1.00 mg/dL 0.82 0.87 0.84  BUN/Creat Ratio 12 - 28 13 NOT APPLICABLE 18  Sodium 093 - 144 mmol/L 141 140 141  Potassium 3.5 - 5.2 mmol/L 4.5 4.9 4.6  Chloride 96 - 106 mmol/L 104 104 105  CO2 20 - 29 mmol/L _1 Calcium 8.7 - 10.3 mg/dL 10.1 10.2 10.0    Adherence Review: Is the patient currently on ACE/ARB medication? Losartan 50 mg   Does the patient have >5 day gap between last estimated fill dates? Per misc rpts, no.  Star Rating Drugs: Rosuvastatin 40 mg 90 DS 12/18/20, Losartan 50 mg 90 DS 11/09/20.  Patient stated she has changed her PCP to [place] NP.    Charlann Lange, [place] Clinical Pharmacist Assistant [phone]

## 2021-02-07 ENCOUNTER — Other Ambulatory Visit: Payer: Self-pay | Admitting: Cardiology

## 2021-02-07 NOTE — Telephone Encounter (Signed)
Spoke to patient.She stated she has been having weak spells and low B/P.She would like to see Dr.Jordan before she goes on vacation.Advised to monitor B/P and keep a log bring readings to appointment.Appointment scheduled with Dr.Jordan 8/17 at 3:30 pm.

## 2021-02-10 DIAGNOSIS — M419 Scoliosis, unspecified: Secondary | ICD-10-CM | POA: Diagnosis not present

## 2021-02-10 DIAGNOSIS — M5136 Other intervertebral disc degeneration, lumbar region: Secondary | ICD-10-CM | POA: Diagnosis not present

## 2021-02-10 DIAGNOSIS — M545 Low back pain, unspecified: Secondary | ICD-10-CM | POA: Diagnosis not present

## 2021-02-13 NOTE — Progress Notes (Signed)
Cardiology Office Note    Date:  02/19/2021   ID:  Kelli Roy, DOB 1945-09-29, MRN [id number]  PCP:  Pablo Lawrence, NP  Cardiologist:  Dr. Martinique  Chief Complaint  Patient presents with   Hypertension   Coronary Artery Disease    History of Present Illness:  Kelli Roy is a 75 y.o. female is seen for follow up.  She has a PMH of CAD, chronic systolic heart failure, hypertension, hyperlipidemia, and history of ischemic cardiomyopathy.  Patient had anterior STEMI in September 2015 and underwent emergent stenting of the proximal LAD with a BMS.  She also had 50 to 70% left main disease, 70 to 80% ostial LAD disease, 50 to 70% ostial left circumflex disease, EF was 25 to 30%.  She was placed on high-dose Lipitor therapy but noted to have marked elevation of her transaminases and her statin was discontinued.  Since then, she has been tolerating Crestor.  She eventually returned in November 2015 for CABG by Dr. Cyndia Bent was LIMA to LAD, SVG to diagonal, SVG to OM, SVG to RCA.  She has a history of a rash reaction to both Brilinta and Plavix.  Echocardiogram in February 2016 showed EF 35 to 40% with akinesis to the anteroseptum and apex.  She developed acute heart failure in March 2016.  She was diuresed with IV Lasix and it was discharged home with addition of Lasix and Aldactone.  She later developed hypotension requiring the discontinuation of Coreg and Aldactone.  She had a Myoview in January 2017 that showed scar in the anteroseptal, apical and inferolateral segment without ischemia, EF 49%.  She was seen on 12/02/2017, she was having a very atypical chest discomfort.  She sought medical attention in [facility] emergency room after having persistent chest soreness for several hours.  Troponin was negative at the time.  EKG showed no significant changes.  Given the atypical nature of her symptoms no further work up recommended.  She was seen in the ED on 05/06/19 with chest  pain and tightness. Ecg showed no acute change and HS troponin negative x 2. She states she was standing at the sink and got really weak- didn't feel well. She was nauseated. She had some tightening in her chest. Still felt poorly the next day and went to the ED. Gradually she has felt better this week. No more chest pressure. Only SOB when carries laundry up stairs. Myoview was done and showed an area of scar. No ischemia. Unchanged from 2017. EF 49%.   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 EGD done showing gastric inflammation and non bleeding gastric ulcers. Pancreatic cyst biopsied. Bx was apparently benign. Was placed on Pepcid.   On follow up today she is doing well from a cardiac standpoint. She is concerned that she has some low BP readings. Feels lightheaded and fuzzy when this happens. BP down to 87/46. Has occasional high readings at 160-170.    Past Medical History:  Diagnosis Date   Allergy    Alopecia    Arthritis    CAD (coronary artery disease)    a. 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 Cyndia Bent    Cataract  Chronic systolic CHF (congestive heart failure) ([place])    a. 03/2014 Echo: EF 25-30%; Pre op TEE 05/2014 with EF of 50%   Family history of cardiovascular disease    Family history of colon cancer    History of long-term treatment with high-risk medication    Hyperlipidemia    Hypertension    Insomnia    Ischemic cardiomyopathy    a. 03/2014 Echo: EF 25-30%, mid-apical/anteroseptal and inf AK, Gr 2 DD, ? apical thrombus, Mild MR.-->Discharged with LifeVest.   Malaise and fatigue    Myocardial infarction ([place]) 03/2014   Shingles 09/18/2019   Shortness of breath dyspnea    Sinus headache     Past Surgical History:   Procedure Laterality Date   BIOPSY  07/03/2020   Procedure: BIOPSY;  Surgeon: Irving Copas., MD;  Location: [facility];  Service: Gastroenterology;;   CARDIAC CATHETERIZATION  2001   showed no blockages   CATARACT EXTRACTION W/PHACO Left 01/04/2014   Procedure: CATARACT EXTRACTION PHACO AND INTRAOCULAR LENS PLACEMENT ([place]);  Surgeon: Tonny Branch, MD;  Location: [facility];  Service: Ophthalmology;  Laterality: Left;  CDE:5.65   CATARACT EXTRACTION W/PHACO Right 01/29/2014   Procedure: CATARACT EXTRACTION PHACO AND INTRAOCULAR LENS PLACEMENT RIGHT EYE CDE=4.49;  Surgeon: Tonny Branch, MD;  Location: [facility];  Service: Ophthalmology;  Laterality: Right;   CERVICAL SPINE SURGERY  09/27/2003   C6-67 servical fusion   COLONOSCOPY WITH PROPOFOL N/A 05/18/2016   Procedure: COLONOSCOPY WITH PROPOFOL;  Surgeon: Garlan Fair, MD;  Location: [facility];  Service: Endoscopy;  Laterality: N/A;   CORONARY ARTERY BYPASS GRAFT N/A 05/21/2014   Procedure: CORONARY ARTERY BYPASS GRAFTING (CABG);  Surgeon: Gaye Pollack, MD;  Location: [place];  Service: Open Heart Surgery;  Laterality: N/A;   CYSTECTOMY  1998   fibroid cysts   DILATION AND CURETTAGE OF UTERUS  1976   2nd to miscarriage   ESOPHAGOGASTRODUODENOSCOPY (EGD) WITH PROPOFOL N/A 07/03/2020   Procedure: ESOPHAGOGASTRODUODENOSCOPY (EGD) WITH PROPOFOL;  Surgeon: Rush Landmark Telford Nab., MD;  Location: [facility];  Service: Gastroenterology;  Laterality: N/A;   EUS N/A 07/03/2020   Procedure: UPPER ENDOSCOPIC ULTRASOUND (EUS) RADIAL;  Surgeon: Irving Copas., MD;  Location: [facility];  Service: Gastroenterology;  Laterality: N/A;   EYE SURGERY     FINE NEEDLE ASPIRATION  07/03/2020   Procedure: FINE NEEDLE ASPIRATION;  Surgeon: Rush Landmark Telford Nab., MD;  Location: [facility];  Service: Gastroenterology;;   INTRAOPERATIVE TRANSESOPHAGEAL ECHOCARDIOGRAM N/A 05/21/2014   Procedure: INTRAOPERATIVE TRANSESOPHAGEAL ECHOCARDIOGRAM;   Surgeon: Gaye Pollack, MD;  Location: 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      Ascorbic Acid (VITAMIN C) 1000 MG tablet Take 1,000 mg by mouth daily.     aspirin EC  81 MG tablet Take 81 mg by mouth at bedtime.      Cholecalciferol (VITAMIN D3) 50 MCG (2000 UT) capsule Take 2,000 Units by mouth daily.     Coenzyme Q10-Vitamin E (QUNOL ULTRA COQ10 PO) Take 200 mg by mouth at bedtime. Liquid     Cyanocobalamin (VITAMIN B12 PO) Take 3,000 mcg by mouth daily.     docusate sodium (COLACE) 100 MG capsule Take 100-200 mg by mouth See admin instructions. Take 100 mg by mouth in the morning and 200 mg at bedtime     eszopiclone (LUNESTA) 1 MG TABS tablet Take immediately before bedtime (Patient taking differently: Take 0.5-1 mg by mouth at bedtime as needed for sleep.) 30 tablet 2   famotidine (PEPCID) 20 MG tablet TAKE 1 TABLET BY MOUTH TWICE A DAY 180 tablet 2   ipratropium (ATROVENT) 0.06 % nasal spray Place 2 sprays into both nostrils daily as needed for rhinitis.     loratadine (CLARITIN) 10 MG tablet Take 10 mg by mouth daily as needed for allergies or rhinitis.     losartan (COZAAR) 50 MG tablet TAKE 1 TABLET BY MOUTH EVERY DAY 90 tablet 3   Magnesium  Cl-Calcium Carbonate (SLOW-MAG PO) Take 1 tablet by mouth 2 (two) times daily.     Multiple Vitamins-Minerals (PRESERVISION/LUTEIN PO) Take by mouth.     mupirocin ointment (BACTROBAN) 2 % 3 (three) times daily as needed.     nitroGLYCERIN (NITROSTAT) 0.4 MG SL tablet Place 1 tablet (0.4 mg total) under the tongue every 5 (five) minutes as needed for chest pain. 25 tablet 3   tetrahydrozoline-zinc (VISINE-AC) 0.05-0.25 % ophthalmic solution Place 2 drops into both eyes daily as needed (for dry or irritated eyes).      trolamine salicylate (ASPERCREME/ALOE) 10 % cream Apply 1 application topically daily as needed (bursitis).     furosemide (LASIX) 20 MG tablet TAKE 1 TABLET BY MOUTH EVERY DAY - MAY TAKE 1 ADDITIONAL TABLET AS NEEDED 180 tablet 2   Na Sulfate-K Sulfate-Mg Sulf (SUPREP BOWEL PREP KIT) 17.5-3.13-1.6 GM/177ML SOLN Take 1 kit by mouth as directed. For colonoscopy prep (Patient not taking: Reported on 02/19/2021) 354 mL 0   rosuvastatin (CRESTOR) 40 MG tablet Take 1 tablet (40 mg total) by mouth daily. (Patient taking differently: Take 40 mg by mouth at bedtime.) 90 tablet 3   No facility-administered medications prior to visit.     Allergies:   Carvedilol, Spironolactone, Demerol [meperidine], Lipitor [atorvastatin], Lisinopril, Dexilant [dexlansoprazole], Latex, Neomycin, Plavix [clopidogrel bisulfate], Sulfonamide derivatives, and Ticagrelor   Social History   Socioeconomic History   Marital status: Married    Spouse name: Not on file   Number of children: Not on file   Years of education: Not on file   Highest education level: Not on file  Occupational History   Not on file  Tobacco Use   Smoking status: Never   Smokeless tobacco: Never  Vaping Use   Vaping Use: Never used  Substance and Sexual Activity   Alcohol use: Not Currently    Comment: rare glass of wine   Drug use: No   Sexual activity: Not Currently    Birth control/protection: Post-menopausal  Other Topics  Concern   Not on file  Social History Narrative   Not on file   Social Determinants of Health   Financial Resource Strain: Low Risk    Difficulty of Paying Living Expenses: Not very hard  Food Insecurity: Not on file  Transportation Needs:  Not on file  Physical Activity: Not on file  Stress: Not on file  Social Connections: Not on file     Family History:  The patient's family history includes Atrial fibrillation in her father; Cancer in her brother, brother, and father; Colon cancer in her father; Coronary artery disease in her brother and brother; Hearing loss in her father; Heart attack in her mother; Heart disease in her mother; Hypertension in her brother, brother, brother, brother, and father; Pancreatic cancer in her brother; Peripheral vascular disease in her father; Stroke in her mother; Thyroid disease in her brother, father, and sister.   ROS:   Please see the history of present illness.    ROS All other systems reviewed and are negative.   PHYSICAL EXAM:   VS:  BP (!) 121/58   Pulse 65   Ht 5' 5.5" (1.664 m)   Wt 154 lb (69.9 kg)   SpO2 98%   BMI 25.24 kg/m    GEN: Well nourished, well developed, in no acute distress  HEENT: normal  Neck: no JVD, carotid bruits, or masses Cardiac: RRR; no murmurs, rubs, or gallops,no edema  Respiratory:  clear to auscultation bilaterally, normal work of breathing GI: soft, nontender, nondistended, + BS MS: no deformity or atrophy  Skin: warm and dry, no rash Neuro:  Alert and Oriented x 3, Strength and sensation are intact Psych: euthymic mood, full affect  Wt Readings from Last 3 Encounters:  02/19/21 154 lb (69.9 kg)  10/24/20 149 lb (67.6 kg)  08/22/20 149 lb 4 oz (67.7 kg)      Studies/Labs Reviewed:   EKG:  EKG is  not ordered today.     Recent Labs: 04/19/2020: Hemoglobin 13.9; Platelets 169 07/09/2020: ALT 17; BUN 11; Creatinine, Ser 0.82; Potassium 4.5; Sodium 141   Lipid Panel    Component Value  Date/Time   CHOL 150 07/09/2020 1022   TRIG 108 07/09/2020 1022   HDL 58 07/09/2020 1022   CHOLHDL 2.6 07/09/2020 1022   CHOLHDL 2.3 04/19/2020 1536   VLDL 25 07/14/2016 1211   LDLCALC 73 07/09/2020 1022   LDLCALC 69 04/19/2020 1536    Additional studies/ records that were reviewed today include:   Echo 08/30/2014 LV EF: 35% -   40% Study Conclusions  - Left ventricle: Poor acoustical windows with multiple wall motion   abnormalities. Recommend limited study with definity contrast   agent for more accurate assessment. There is distal septal   akinesis. The cavity size was normal. Systolic function was   moderately reduced. The estimated ejection fraction was in the   range of 35% to 40%. There is akinesis of the apical myocardium.   There is akinesis of the apicalanterior myocardium. There is   akinesis of the midanteroseptal myocardium. There is akinesis of   the apicallateral myocardium. There was a reduced contribution of   atrial contraction to ventricular filling, due to increased   ventricular diastolic pressure or atrial contractile dysfunction.   Doppler parameters are consistent with a reversible restrictive   pattern, indicative of decreased left ventricular diastolic   compliance and/or increased left atrial pressure (grade 3   diastolic dysfunction). - Aortic valve: Trileaflet; normal thickness, mildly calcified   leaflets. - Mitral valve: There was mild regurgitation. - Left atrium: The atrium was mildly dilated. - Pulmonic valve: There was mild regurgitation.   Myoview 07/10/2015 Study Highlights   Nuclear stress EF: 49%. The left ventricular ejection fraction is mildly decreased (45-54%). There was  no ST segment deviation noted during stress. Defect 1: There is a defect present in the mid anteroseptal, mid inferolateral, apical septal, apical lateral and apex location. Findings consistent with prior myocardial infarction. This is a low risk study.   1. Low  risk study 2. Scar antero septal, apical and inferolateral. 3. Low nl EF   Myoview 05/23/19: Study Highlights    The left ventricular ejection fraction is mildly decreased (45-54%). Nuclear stress EF: 49%. No T wave inversion was noted during stress. There was no ST segment deviation noted during stress. Defect 1: There is a large defect of severe severity present in the mid anterior, mid anteroseptal, mid inferolateral, apical anterior, apical septal, apical inferior, apical lateral and apex location. Findings consistent with prior myocardial infarction. This is a low risk study.   Large size, severe severity fixed distal anteroseptal, anterior, apical, inferoapical and apical lateral perfusion defect suggestive of scar without significant reversible ischemia (SDS 1). LVEF 49% with distal anterior, anteroseptal and apical akinesis. This is an intermediate risk study. No change compared to prior study in 2017.     ASSESSMENT:    1. Coronary artery disease involving native coronary artery of native heart without angina pectoris   2. Chronic systolic heart failure ([place])   3. S/P CABG x 4   4. Essential hypertension      PLAN:  In order of problems listed above:  HTN. Readings are generally well controlled but she has some low BP readings that are symptomatic. Recommend reducing lasix to every other day. If no swelling may eventually come off lasix altogether.  Continue losartan therapy.  2.   CAD: Last Myoview Nov 2020- stable. She has stable class 1 angina.  3.   Chronic systolic heart failure: appears well compensated. Last EF 49%. Adjust lasix as above.  4.   Hyperlipidemia: LDL improved to 69 with increased Crestor dose.   5.   Ischemic cardiomyopathy: On losartan, unable to tolerate beta-blocker due to baseline bradycardia.  6. Pancreatic cyst - per GI  7. Gastric ulcers. On Pepcid  Follow up 6 months with lab. Medication Adjustments/Labs and Tests Ordered: Current  medicines are reviewed at length with the patient today.  Concerns regarding medicines are outlined above.  Medication changes, Labs and Tests ordered today are listed in the Patient Instructions below. There are no Patient Instructions on file for this visit.   Signed, Eleen Litz Martinique, MD  02/19/2021 3:48 PM    [facility] [address], [place], [place]  [address] Phone: [phone]; Fax: [phone]

## 2021-02-19 ENCOUNTER — Other Ambulatory Visit: Payer: Self-pay

## 2021-02-19 ENCOUNTER — Ambulatory Visit: Payer: PPO | Admitting: Cardiology

## 2021-02-19 ENCOUNTER — Encounter: Payer: Self-pay | Admitting: Cardiology

## 2021-02-19 VITALS — BP 121/58 | HR 65 | Ht 65.5 in | Wt 154.0 lb

## 2021-02-19 DIAGNOSIS — I5022 Chronic systolic (congestive) heart failure: Secondary | ICD-10-CM

## 2021-02-19 DIAGNOSIS — I2510 Atherosclerotic heart disease of native coronary artery without angina pectoris: Secondary | ICD-10-CM | POA: Diagnosis not present

## 2021-02-19 DIAGNOSIS — Z951 Presence of aortocoronary bypass graft: Secondary | ICD-10-CM | POA: Diagnosis not present

## 2021-02-19 DIAGNOSIS — I10 Essential (primary) hypertension: Secondary | ICD-10-CM

## 2021-02-19 MED ORDER — FUROSEMIDE 20 MG PO TABS: 20.0000 mg | ORAL_TABLET | ORAL | 2 refills | Status: DC

## 2021-03-01 NOTE — Progress Notes (Deleted)
Cardiology Office Note    Date:  03/01/2021   ID:  Kelli Roy, DOB 1945-12-11, MRN [id number]  PCP:  Pablo Lawrence, NP  Cardiologist:  Dr. Martinique  No chief complaint on file.   History of Present Illness:  Kelli Roy is a 75 y.o. female is seen for follow up.  She has a PMH of CAD, chronic systolic heart failure, hypertension, hyperlipidemia, and history of ischemic cardiomyopathy.  Patient had anterior STEMI in September 2015 and underwent emergent stenting of the proximal LAD with a BMS.  She also had 50 to 70% left main disease, 70 to 80% ostial LAD disease, 50 to 70% ostial left circumflex disease, EF was 25 to 30%.  She was placed on high-dose Lipitor therapy but noted to have marked elevation of her transaminases and her statin was discontinued.  Since then, she has been tolerating Crestor.  She eventually returned in November 2015 for CABG by Dr. Cyndia Bent was LIMA to LAD, SVG to diagonal, SVG to OM, SVG to RCA.  She has a history of a rash reaction to both Brilinta and Plavix.  Echocardiogram in February 2016 showed EF 35 to 40% with akinesis to the anteroseptum and apex.  She developed acute heart failure in March 2016.  She was diuresed with IV Lasix and it was discharged home with addition of Lasix and Aldactone.  She later developed hypotension requiring the discontinuation of Coreg and Aldactone.  She had a Myoview in January 2017 that showed scar in the anteroseptal, apical and inferolateral segment without ischemia, EF 49%.  She was seen on 12/02/2017, she was having a very atypical chest discomfort.  She sought medical attention in [facility] emergency room after having persistent chest soreness for several hours.  Troponin was negative at the time.  EKG showed no significant changes.  Given the atypical nature of her symptoms no further work up recommended.  She was seen in the ED on 05/06/19 with chest pain and tightness. Ecg showed no acute change and HS  troponin negative x 2. She states she was standing at the sink and got really weak- didn't feel well. She was nauseated. She had some tightening in her chest. Still felt poorly the next day and went to the ED. Gradually she has felt better this week. No more chest pressure. Only SOB when carries laundry up stairs. Myoview was done and showed an area of scar. No ischemia. Unchanged from 2017. EF 49%.   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 EGD done showing gastric inflammation and non bleeding gastric ulcers. Pancreatic cyst biopsied. Bx was apparently benign. Was placed on Pepcid.   On follow up today she is doing well from a cardiac standpoint. She is concerned that she has some low BP readings. Feels lightheaded and fuzzy when this happens. BP down to 87/46. Has occasional high readings at 160-170.    Past Medical History:  Diagnosis Date   Allergy    Alopecia    Arthritis    CAD (coronary artery disease)    a. 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 Cyndia Bent    Cataract    Chronic systolic CHF (congestive heart failure) ([place])  a. 03/2014 Echo: EF 25-30%; Pre op TEE 05/2014 with EF of 50%   Family history of cardiovascular disease    Family history of colon cancer    History of long-term treatment with high-risk medication    Hyperlipidemia    Hypertension    Insomnia    Ischemic cardiomyopathy    a. 03/2014 Echo: EF 25-30%, mid-apical/anteroseptal and inf AK, Gr 2 DD, ? apical thrombus, Mild MR.-->Discharged with LifeVest.   Malaise and fatigue    Myocardial infarction ([place]) 03/2014   Shingles 09/18/2019   Shortness of breath dyspnea    Sinus headache     Past Surgical History:  Procedure Laterality Date   BIOPSY  07/03/2020   Procedure:  BIOPSY;  Surgeon: Irving Copas., MD;  Location: [facility];  Service: Gastroenterology;;   CARDIAC CATHETERIZATION  2001   showed no blockages   CATARACT EXTRACTION W/PHACO Left 01/04/2014   Procedure: CATARACT EXTRACTION PHACO AND INTRAOCULAR LENS PLACEMENT ([place]);  Surgeon: Tonny Branch, MD;  Location: [facility];  Service: Ophthalmology;  Laterality: Left;  CDE:5.65   CATARACT EXTRACTION W/PHACO Right 01/29/2014   Procedure: CATARACT EXTRACTION PHACO AND INTRAOCULAR LENS PLACEMENT RIGHT EYE CDE=4.49;  Surgeon: Tonny Branch, MD;  Location: [facility];  Service: Ophthalmology;  Laterality: Right;   CERVICAL SPINE SURGERY  09/27/2003   C6-67 servical fusion   COLONOSCOPY WITH PROPOFOL N/A 05/18/2016   Procedure: COLONOSCOPY WITH PROPOFOL;  Surgeon: Garlan Fair, MD;  Location: [facility];  Service: Endoscopy;  Laterality: N/A;   CORONARY ARTERY BYPASS GRAFT N/A 05/21/2014   Procedure: CORONARY ARTERY BYPASS GRAFTING (CABG);  Surgeon: Gaye Pollack, MD;  Location: [place];  Service: Open Heart Surgery;  Laterality: N/A;   CYSTECTOMY  1998   fibroid cysts   DILATION AND CURETTAGE OF UTERUS  1976   2nd to miscarriage   ESOPHAGOGASTRODUODENOSCOPY (EGD) WITH PROPOFOL N/A 07/03/2020   Procedure: ESOPHAGOGASTRODUODENOSCOPY (EGD) WITH PROPOFOL;  Surgeon: Rush Landmark Telford Nab., MD;  Location: [facility];  Service: Gastroenterology;  Laterality: N/A;   EUS N/A 07/03/2020   Procedure: UPPER ENDOSCOPIC ULTRASOUND (EUS) RADIAL;  Surgeon: Irving Copas., MD;  Location: [facility];  Service: Gastroenterology;  Laterality: N/A;   EYE SURGERY     FINE NEEDLE ASPIRATION  07/03/2020   Procedure: FINE NEEDLE ASPIRATION;  Surgeon: Rush Landmark Telford Nab., MD;  Location: [facility];  Service: Gastroenterology;;   INTRAOPERATIVE TRANSESOPHAGEAL ECHOCARDIOGRAM N/A 05/21/2014   Procedure: INTRAOPERATIVE TRANSESOPHAGEAL ECHOCARDIOGRAM;  Surgeon: Gaye Pollack, MD;  Location: 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      Ascorbic Acid (VITAMIN C) 1000 MG tablet Take 1,000 mg by mouth daily.     aspirin EC 81 MG tablet Take 81 mg by mouth at bedtime.  Cholecalciferol (VITAMIN D3) 50 MCG (2000 UT) capsule Take 2,000 Units by mouth daily.     Coenzyme Q10-Vitamin E (QUNOL ULTRA COQ10 PO) Take 200 mg by mouth at bedtime. Liquid     Cyanocobalamin (VITAMIN B12 PO) Take 3,000 mcg by mouth daily.     docusate sodium (COLACE) 100 MG capsule Take 100-200 mg by mouth See admin instructions. Take 100 mg by mouth in the morning and 200 mg at bedtime     eszopiclone (LUNESTA) 1 MG TABS tablet Take immediately before bedtime (Patient taking differently: Take 0.5-1 mg by mouth at bedtime as needed for sleep.) 30 tablet 2   famotidine (PEPCID) 20 MG tablet TAKE 1 TABLET BY MOUTH TWICE A DAY 180 tablet 2   furosemide (LASIX) 20 MG tablet Take 1 tablet (20 mg total) by mouth every other day. 180 tablet 2   ipratropium (ATROVENT) 0.06 % nasal spray Place 2 sprays into both nostrils daily as needed for rhinitis.     loratadine (CLARITIN) 10 MG tablet Take 10 mg by mouth daily as needed for allergies or rhinitis.     losartan (COZAAR) 50 MG tablet TAKE 1 TABLET BY  MOUTH EVERY DAY 90 tablet 3   Magnesium Cl-Calcium Carbonate (SLOW-MAG PO) Take 1 tablet by mouth 2 (two) times daily.     Multiple Vitamins-Minerals (PRESERVISION/LUTEIN PO) Take by mouth.     mupirocin ointment (BACTROBAN) 2 % 3 (three) times daily as needed.     Na Sulfate-K Sulfate-Mg Sulf (SUPREP BOWEL PREP KIT) 17.5-3.13-1.6 GM/177ML SOLN Take 1 kit by mouth as directed. For colonoscopy prep (Patient not taking: Reported on 02/19/2021) 354 mL 0   nitroGLYCERIN (NITROSTAT) 0.4 MG SL tablet Place 1 tablet (0.4 mg total) under the tongue every 5 (five) minutes as needed for chest pain. 25 tablet 3   rosuvastatin (CRESTOR) 40 MG tablet Take 1 tablet (40 mg total) by mouth daily. (Patient taking differently: Take 40 mg by mouth at bedtime.) 90 tablet 3   tetrahydrozoline-zinc (VISINE-AC) 0.05-0.25 % ophthalmic solution Place 2 drops into both eyes daily as needed (for dry or irritated eyes).      trolamine salicylate (ASPERCREME/ALOE) 10 % cream Apply 1 application topically daily as needed (bursitis).     No facility-administered medications prior to visit.     Allergies:   Carvedilol, Spironolactone, Demerol [meperidine], Lipitor [atorvastatin], Lisinopril, Dexilant [dexlansoprazole], Latex, Neomycin, Plavix [clopidogrel bisulfate], Sulfonamide derivatives, and Ticagrelor   Social History   Socioeconomic History   Marital status: Married    Spouse name: Not on file   Number of children: Not on file   Years of education: Not on file   Highest education level: Not on file  Occupational History   Not on file  Tobacco Use   Smoking status: Never   Smokeless tobacco: Never  Vaping Use   Vaping Use: Never used  Substance and Sexual Activity   Alcohol use: Not Currently    Comment: rare glass of wine   Drug use: No   Sexual activity: Not Currently    Birth control/protection: Post-menopausal  Other Topics Concern   Not on file  Social History Narrative   Not on file   Social  Determinants of Health   Financial Resource Strain: Low Risk    Difficulty of Paying Living Expenses: Not very hard  Food Insecurity: Not on file  Transportation Needs: Not on file  Physical Activity: Not on file  Stress: Not on file  Social Connections: Not on  file     Family History:  The patient's family history includes Atrial fibrillation in her father; Cancer in her brother, brother, and father; Colon cancer in her father; Coronary artery disease in her brother and brother; Hearing loss in her father; Heart attack in her mother; Heart disease in her mother; Hypertension in her brother, brother, brother, brother, and father; Pancreatic cancer in her brother; Peripheral vascular disease in her father; Stroke in her mother; Thyroid disease in her brother, father, and sister.   ROS:   Please see the history of present illness.    ROS All other systems reviewed and are negative.   PHYSICAL EXAM:   VS:  There were no vitals taken for this visit.   GEN: Well nourished, well developed, in no acute distress  HEENT: normal  Neck: no JVD, carotid bruits, or masses Cardiac: RRR; no murmurs, rubs, or gallops,no edema  Respiratory:  clear to auscultation bilaterally, normal work of breathing GI: soft, nontender, nondistended, + BS MS: no deformity or atrophy  Skin: warm and dry, no rash Neuro:  Alert and Oriented x 3, Strength and sensation are intact Psych: euthymic mood, full affect  Wt Readings from Last 3 Encounters:  02/19/21 154 lb (69.9 kg)  10/24/20 149 lb (67.6 kg)  08/22/20 149 lb 4 oz (67.7 kg)      Studies/Labs Reviewed:   EKG:  EKG is  not ordered today.     Recent Labs: 04/19/2020: Hemoglobin 13.9; Platelets 169 07/09/2020: ALT 17; BUN 11; Creatinine, Ser 0.82; Potassium 4.5; Sodium 141   Lipid Panel    Component Value Date/Time   CHOL 150 07/09/2020 1022   TRIG 108 07/09/2020 1022   HDL 58 07/09/2020 1022   CHOLHDL 2.6 07/09/2020 1022   CHOLHDL 2.3  04/19/2020 1536   VLDL 25 07/14/2016 1211   LDLCALC 73 07/09/2020 1022   LDLCALC 69 04/19/2020 1536    Additional studies/ records that were reviewed today include:   Echo 08/30/2014 LV EF: 35% -   40% Study Conclusions  - Left ventricle: Poor acoustical windows with multiple wall motion   abnormalities. Recommend limited study with definity contrast   agent for more accurate assessment. There is distal septal   akinesis. The cavity size was normal. Systolic function was   moderately reduced. The estimated ejection fraction was in the   range of 35% to 40%. There is akinesis of the apical myocardium.   There is akinesis of the apicalanterior myocardium. There is   akinesis of the midanteroseptal myocardium. There is akinesis of   the apicallateral myocardium. There was a reduced contribution of   atrial contraction to ventricular filling, due to increased   ventricular diastolic pressure or atrial contractile dysfunction.   Doppler parameters are consistent with a reversible restrictive   pattern, indicative of decreased left ventricular diastolic   compliance and/or increased left atrial pressure (grade 3   diastolic dysfunction). - Aortic valve: Trileaflet; normal thickness, mildly calcified   leaflets. - Mitral valve: There was mild regurgitation. - Left atrium: The atrium was mildly dilated. - Pulmonic valve: There was mild regurgitation.   Myoview 07/10/2015 Study Highlights   Nuclear stress EF: 49%. The left ventricular ejection fraction is mildly decreased (45-54%). There was no ST segment deviation noted during stress. Defect 1: There is a defect present in the mid anteroseptal, mid inferolateral, apical septal, apical lateral and apex location. Findings consistent with prior myocardial infarction. This is a low risk study.   1.  Low risk study 2. Scar antero septal, apical and inferolateral. 3. Low nl EF   Myoview 05/23/19: Study Highlights    The left  ventricular ejection fraction is mildly decreased (45-54%). Nuclear stress EF: 49%. No T wave inversion was noted during stress. There was no ST segment deviation noted during stress. Defect 1: There is a large defect of severe severity present in the mid anterior, mid anteroseptal, mid inferolateral, apical anterior, apical septal, apical inferior, apical lateral and apex location. Findings consistent with prior myocardial infarction. This is a low risk study.   Large size, severe severity fixed distal anteroseptal, anterior, apical, inferoapical and apical lateral perfusion defect suggestive of scar without significant reversible ischemia (SDS 1). LVEF 49% with distal anterior, anteroseptal and apical akinesis. This is an intermediate risk study. No change compared to prior study in 2017.     ASSESSMENT:    No diagnosis found.    PLAN:  In order of problems listed above:  HTN. Readings are generally well controlled but she has some low BP readings that are symptomatic. Recommend reducing lasix to every other day. If no swelling may eventually come off lasix altogether.  Continue losartan therapy.  2.   CAD: Last Myoview Nov 2020- stable. She has stable class 1 angina.  3.   Chronic systolic heart failure: appears well compensated. Last EF 49%. Adjust lasix as above.  4.   Hyperlipidemia: LDL improved to 69 with increased Crestor dose.   5.   Ischemic cardiomyopathy: On losartan, unable to tolerate beta-blocker due to baseline bradycardia.  6. Pancreatic cyst - per GI  7. Gastric ulcers. On Pepcid  Follow up 6 months with lab. Medication Adjustments/Labs and Tests Ordered: Current medicines are reviewed at length with the patient today.  Concerns regarding medicines are outlined above.  Medication changes, Labs and Tests ordered today are listed in the Patient Instructions below. There are no Patient Instructions on file for this visit.   Signed, Arnell Mausolf Martinique, MD  03/01/2021  4:38 PM    [facility] [address], [place], [place]  [address] Phone: [phone]; Fax: [phone]

## 2021-03-02 IMAGING — MR MR ABDOMEN WO/W CM
23 of 25 series · 46 of 48 positions shown · IV contrast (7ML Gadavist)
Comparison: Multiple prior studies most recent exam from October 18, 2019 are available for comparison

CLINICAL DATA: Follow-up of cystic pancreatic lesions.

EXAM:
MRI ABDOMEN WITHOUT AND WITH CONTRAST
TECHNIQUE: Multiplanar multisequence MR imaging of the abdomen was performed
both before and after the administration of intravenous contrast.
CONTRAST:  7mL GADAVIST GADOBUTROL 1 MMOL/ML IV SOLN

[Series 3: cor haste · coronal · 6.0mm · 1.25mm/px · 1 of 26 slices shown]
[im 1/26]
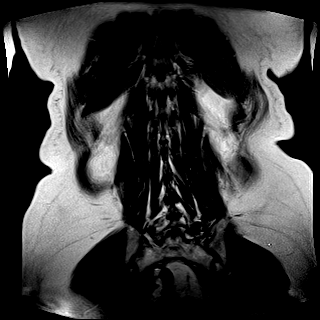

[Series 6: T2 fat-sat · axial · 6.0mm · 1.19mm/px · 1 of 30 slices shown]
[im 1/30]
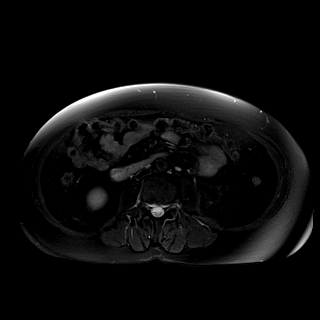

[Series 7: ax haste bh · axial · 6.0mm · 1.19mm/px · 1 of 30 slices shown]
[im 1/30]
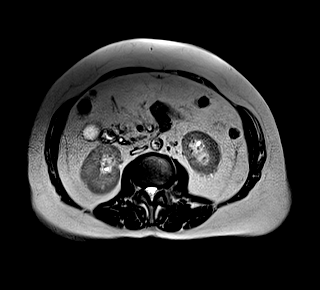

[Series 8: DWI · axial · 6.0mm · 1.42mm/px · 1 of 30 slices shown (1 of 4)]
[im 1/30]
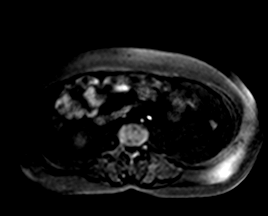

[Series 8: DWI · axial · 6.0mm · 1.42mm/px · 1 of 30 slices shown (2 of 4)]
[im 1/30]
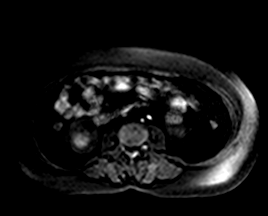

[Series 8: DWI · axial · 6.0mm · 1.42mm/px · 1 of 30 slices shown (3 of 4)]
[im 1/30]
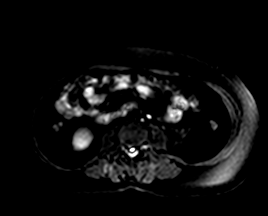

[Series 9: DWI · axial · 6.0mm · 1.42mm/px · 1 of 30 slices shown (4 of 4)]
[im 1/30]
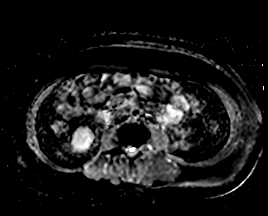

[Series 10: ax in and · axial · 3.0mm · 1.19mm/px · z∈[-66,+147]mm · 2 of 72 slices shown (1 of 2)]
[im 1/72]
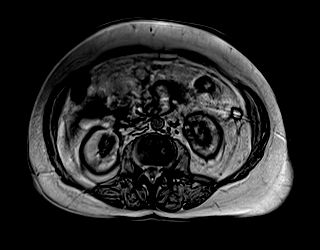
[im 72/72]
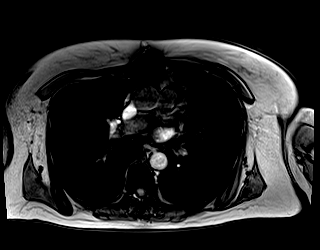

[Series 11: ax in and · axial · 3.0mm · 1.19mm/px · z∈[-66,+147]mm · 2 of 72 slices shown (2 of 2)]
[im 1/72]
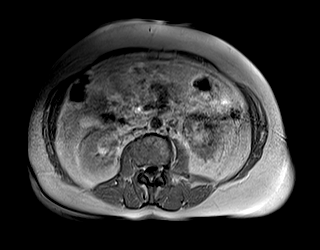
[im 72/72]
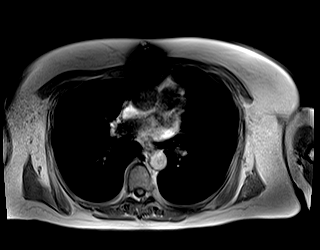

[Series 12: bSSFP · axial · 6.0mm · 0.74mm/px · 1 of 48 slices shown]
[im 1/48]
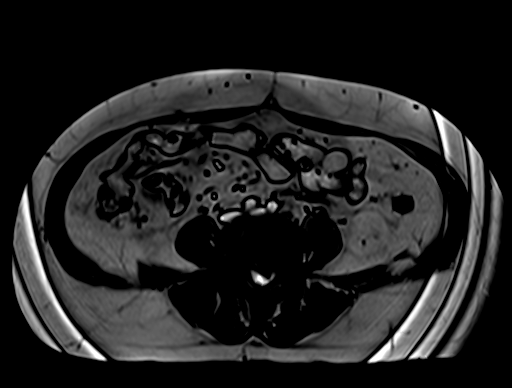

[Series 13: MRCP · coronal · 4.0mm · 1.12mm/px · 1 of 15 slices shown]
[im 1/15]
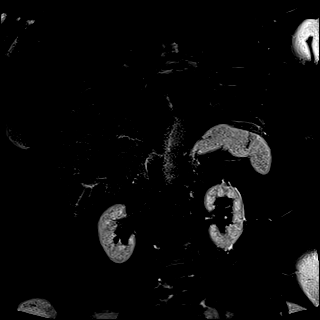

[Series 18: T1 dynamic · axial · 3.0mm · 1.19mm/px · z∈[-67,+146]mm · 2 of 72 slices shown (1 of 9)]
[im 1/72]
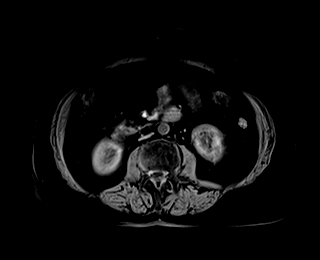
[im 72/72]
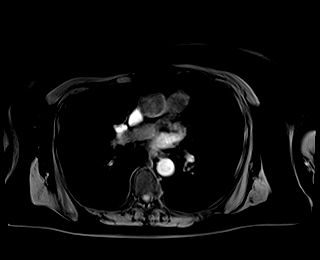

[Series 20: T1 dynamic · axial · 3.0mm · 1.19mm/px · z∈[-67,+146]mm · 2 of 72 slices shown (2 of 9)]
[im 1/72]
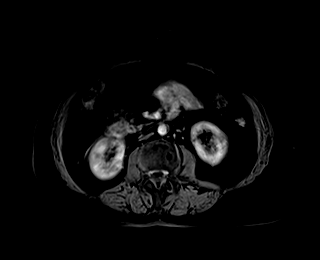
[im 72/72]
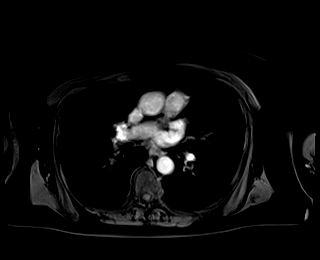

[Series 21: T1 dynamic · axial · 3.0mm · 1.19mm/px · z∈[-67,+146]mm · 2 of 72 slices shown (3 of 9)]
[im 1/72]
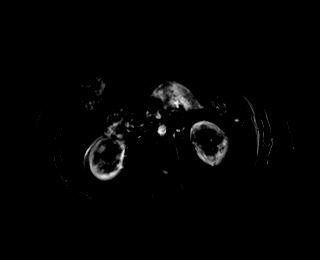
[im 72/72]
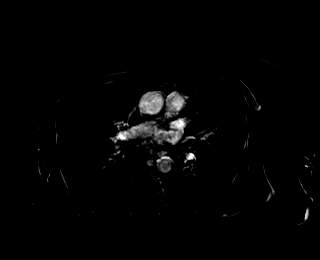

[Series 22: T1 dynamic · axial · 3.0mm · 1.19mm/px · z∈[-67,+146]mm · 3 of 72 slices shown (4 of 9)]
[im 1/72]
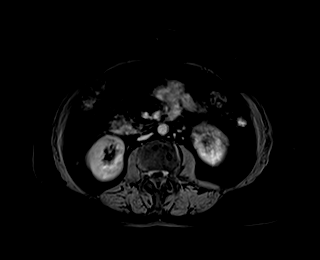
[im 36/72]
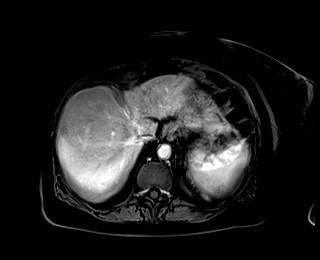
[im 72/72]
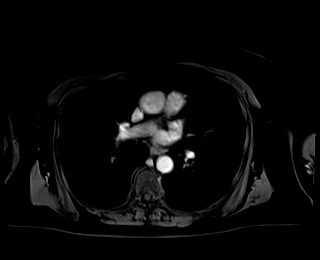

[Series 23: T1 dynamic · axial · 3.0mm · 1.19mm/px · z∈[-67,+146]mm · 3 of 72 slices shown (5 of 9)]
[im 1/72]
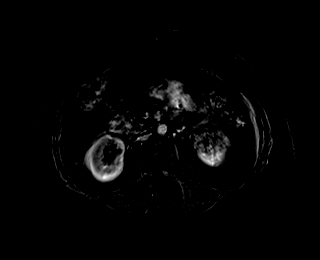
[im 36/72]
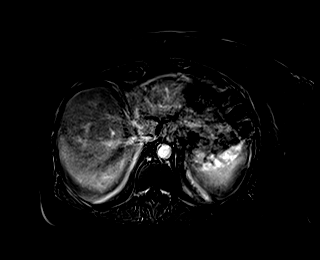
[im 72/72]
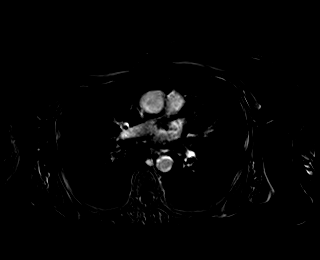

[Series 24: T1 dynamic · axial · 3.0mm · 1.19mm/px · z∈[-67,+146]mm · 3 of 72 slices shown (6 of 9)]
[im 1/72]
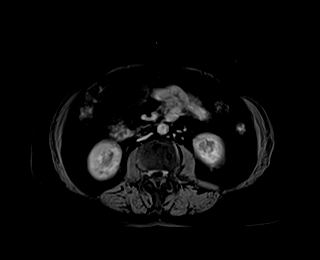
[im 36/72]
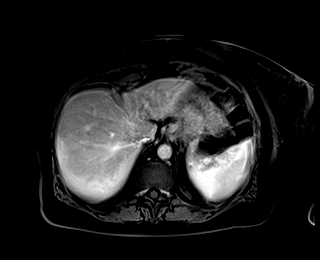
[im 72/72]
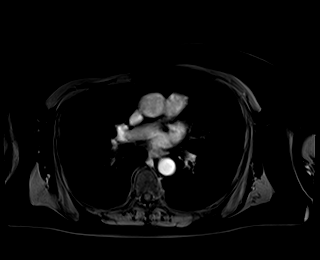

[Series 25: T1 dynamic · axial · 3.0mm · 1.19mm/px · z∈[-67,+146]mm · 3 of 72 slices shown (7 of 9)]
[im 1/72]
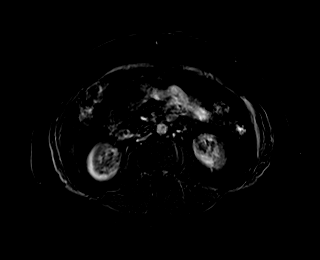
[im 36/72]
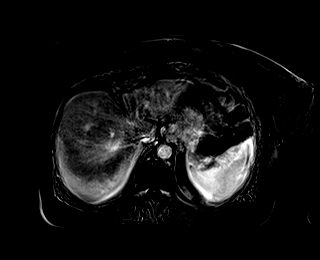
[im 72/72]
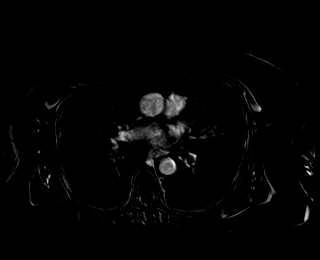

[Series 26: T1 dynamic · axial · 3.0mm · 1.19mm/px · z∈[-67,+146]mm · 3 of 72 slices shown (8 of 9)]
[im 1/72]
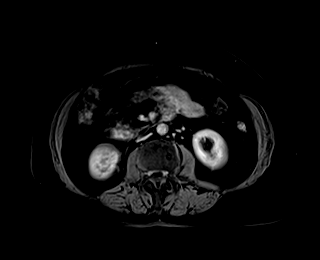
[im 36/72]
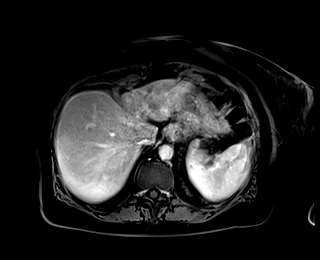
[im 72/72]
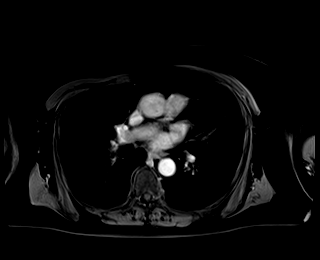

[Series 27: T1 dynamic · axial · 3.0mm · 1.19mm/px · z∈[-67,+146]mm · 3 of 72 slices shown (9 of 9)]
[im 1/72]
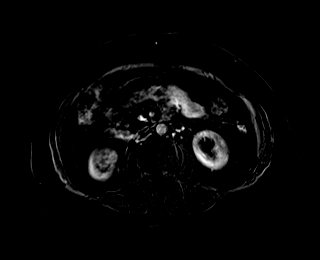
[im 36/72]
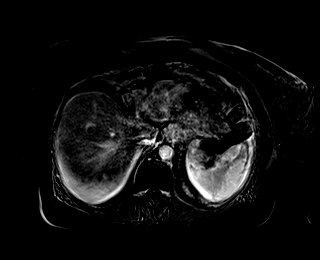
[im 72/72]
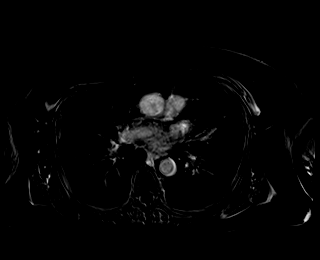

[Series 28: T1 dynamic post-contrast · coronal · 3.0mm · 1.31mm/px · 3 of 72 slices shown (1 of 3)]
[im 1/72]
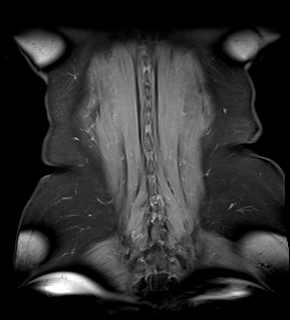
[im 36/72]
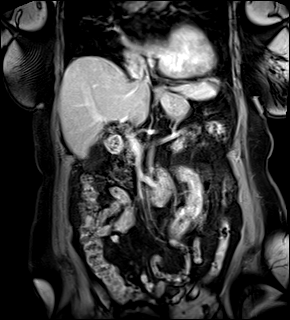
[im 72/72]
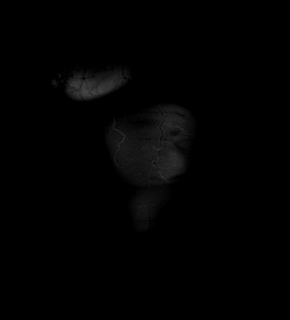

[Series 29: T1 dynamic post-contrast · axial · 3.0mm · 1.19mm/px · z∈[-67,+146]mm · 3 of 72 slices shown (2 of 3)]
[im 1/72]
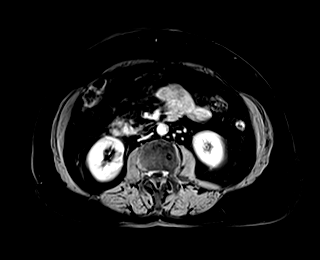
[im 36/72]
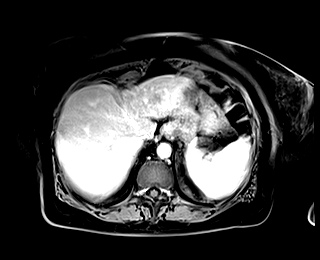
[im 72/72]
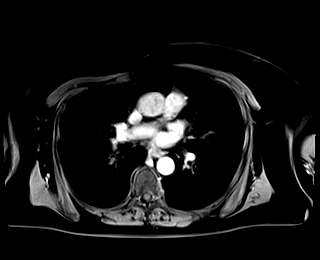

[Series 30: T1 dynamic post-contrast · axial · 3.0mm · 1.19mm/px · z∈[-67,+146]mm · 3 of 72 slices shown (3 of 3)]
[im 1/72]
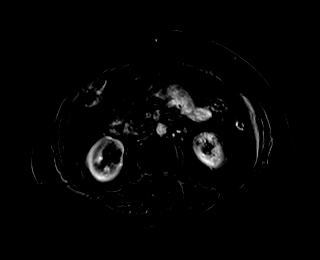
[im 36/72]
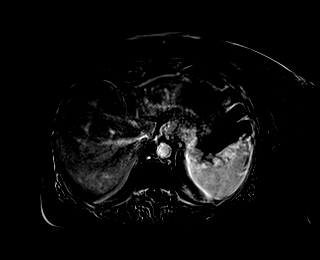
[im 72/72]
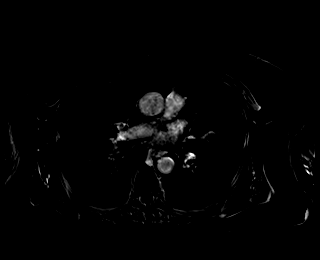

[46 of 48 positions shown; findings below may reference images not displayed]

FINDINGS: Lower chest: Heart size remains enlarged following median
sternotomy. Lung base assessment is limited without gross effusion
or sign of consolidative change.

Hepatobiliary: No focal, suspicious hepatic lesion. Portal vein is
patent. No biliary duct dilation or pericholecystic stranding.

Pancreas: Lobulated 2.3 cm lesion in the body of the pancreas (image
41, series 16) stable when measured in a similar fashion by this
observer on the prior study. The lesion communicates with the main
pancreatic duct which 1 mildly prominent is not frankly dilated and
is unchanged with respect to appearance when compared to the
previous MRCP images accounting for some mild motion on the current
exam. Ectatic side branches are noted throughout the pancreatic
parenchyma. Small cystic lesion in the head of the pancreas,
unilocular and unchanged from previous imaging not definitely
communicating with the main pancreatic duct measures approximately
7-8 mm (image 23, series 6) no peripancreatic inflammation. Mild
generalized pancreatic atrophy. No nodular areas of enhancement are
noted grossly within these areas of the pancreas though assessment
is mildly limited by motion artifact. There is some intrinsic T1
signal and remaining pancreatic parenchyma. No signs of pancreatic
divisum.

Spleen: Spleen normal in size and contour. No focal splenic
abnormality.

Adrenals/Urinary Tract: Adrenal glands are normal. Kidneys enhance
symmetrically without focal lesion.

Stomach/Bowel: Gastrointestinal tract without acute process to the
extent evaluated. Study not performed for bowel evaluation. Pelvic
bowel loops not imaged entirely.

Vascular/Lymphatic: Calcific atheromatous plaque in the abdominal
aorta. No aneurysmal dilation. There is no gastrohepatic or
hepatoduodenal ligament lymphadenopathy. No retroperitoneal or
mesenteric lymphadenopathy.

Other:  No ascites

Musculoskeletal: No suspicious bone lesions identified.
IMPRESSION: 1. Cystic pancreatic lesions are unchanged. Dominant lesion 2.3 cm
stable from previous imaging and without nodular enhancement or
other high-risk features. Six-month follow-up with MRI/MRCP with and
without contrast is suggested. Intraductal papillary mucinous
neoplasm is considered for dominant lesion.

## 2021-03-04 ENCOUNTER — Ambulatory Visit: Payer: PPO | Admitting: Cardiology

## 2021-04-02 DIAGNOSIS — I5022 Chronic systolic (congestive) heart failure: Secondary | ICD-10-CM | POA: Diagnosis not present

## 2021-04-02 DIAGNOSIS — Z23 Encounter for immunization: Secondary | ICD-10-CM | POA: Diagnosis not present

## 2021-04-02 DIAGNOSIS — G4709 Other insomnia: Secondary | ICD-10-CM | POA: Diagnosis not present

## 2021-04-02 DIAGNOSIS — R7301 Impaired fasting glucose: Secondary | ICD-10-CM | POA: Diagnosis not present

## 2021-04-02 DIAGNOSIS — M5135 Other intervertebral disc degeneration, thoracolumbar region: Secondary | ICD-10-CM | POA: Diagnosis not present

## 2021-04-02 DIAGNOSIS — Z0000 Encounter for general adult medical examination without abnormal findings: Secondary | ICD-10-CM | POA: Diagnosis not present

## 2021-04-02 DIAGNOSIS — E782 Mixed hyperlipidemia: Secondary | ICD-10-CM | POA: Diagnosis not present

## 2021-04-02 DIAGNOSIS — I10 Essential (primary) hypertension: Secondary | ICD-10-CM | POA: Diagnosis not present

## 2021-04-25 ENCOUNTER — Telehealth: Payer: Self-pay

## 2021-05-01 DIAGNOSIS — H95122 Granulation of postmastoidectomy cavity, left ear: Secondary | ICD-10-CM | POA: Diagnosis not present

## 2021-06-02 ENCOUNTER — Telehealth: Payer: Self-pay | Admitting: Cardiology

## 2021-06-02 NOTE — Telephone Encounter (Signed)
Spoke to patient she stated she tested positive for covid 11/24.Stated her symptoms have been mild.Advised to rest ,stay hydrated.Stated she has already notified PCP.Advised I will make Dr.Jordan aware.

## 2021-06-02 NOTE — Telephone Encounter (Signed)
Rec'd this message via Mychart scheduling pool:    Kelli Roy / Dr. Martinique, I wanted to let you know that I tested POSITIVE for Covid this afternoon (home test). I was not feeling well Thursday (Thanksgiving Day) morning so I opted not to accompany my husband and his mom to [place] where his sister was hosting the Thanksgiving meal. Mostly my head felt full and ears stopped up, no fever...yet.  Bad headache....didn't sleep well. My husband was cooking breakfast this morning and the smell of food made me nauseous.  I took extra strength Tylenol and a Dramamine early morning because I felt like I needed sleep. Woke up at 11:30, had some crackers and coke...and eventually a cup of coffee and a pastry. Have felt much better as the day went on. I know I need to rest and not try to do laundry, cook, etc. as I hear rest is the key. Since I did not have the Covid vaccines or booster, I am hoping this is as bad as it gets, but presumably this is only day #2 and I hear things can take a turn for the worse. Please let me know if there is anything I need to do. I realize there are two meds now that some doctors are prescribing--Paxlovid and Molnupiravir.  Considering my allergies to everything, not sure I need them.

## 2021-06-03 NOTE — Telephone Encounter (Signed)
Agree  PJ

## 2021-06-06 DIAGNOSIS — Z1231 Encounter for screening mammogram for malignant neoplasm of breast: Secondary | ICD-10-CM | POA: Diagnosis not present

## 2021-06-18 ENCOUNTER — Other Ambulatory Visit: Payer: Self-pay | Admitting: Surgery

## 2021-06-18 DIAGNOSIS — D490 Neoplasm of unspecified behavior of digestive system: Secondary | ICD-10-CM

## 2021-06-19 ENCOUNTER — Other Ambulatory Visit: Payer: Self-pay | Admitting: Surgery

## 2021-07-10 DIAGNOSIS — H353130 Nonexudative age-related macular degeneration, bilateral, stage unspecified: Secondary | ICD-10-CM | POA: Diagnosis not present

## 2021-07-17 ENCOUNTER — Ambulatory Visit
Admission: RE | Admit: 2021-07-17 | Discharge: 2021-07-17 | Disposition: A | Discharge status: Home / Self Care | Payer: PPO | Source: Ambulatory Visit | Attending: Surgery | Admitting: Surgery

## 2021-07-17 ENCOUNTER — Other Ambulatory Visit: Payer: Self-pay

## 2021-07-17 DIAGNOSIS — D490 Neoplasm of unspecified behavior of digestive system: Secondary | ICD-10-CM

## 2021-07-17 DIAGNOSIS — K862 Cyst of pancreas: Secondary | ICD-10-CM | POA: Diagnosis not present

## 2021-07-17 DIAGNOSIS — K449 Diaphragmatic hernia without obstruction or gangrene: Secondary | ICD-10-CM | POA: Diagnosis not present

## 2021-07-17 DIAGNOSIS — R935 Abnormal findings on diagnostic imaging of other abdominal regions, including retroperitoneum: Secondary | ICD-10-CM | POA: Diagnosis not present

## 2021-07-17 DIAGNOSIS — N281 Cyst of kidney, acquired: Secondary | ICD-10-CM | POA: Diagnosis not present

## 2021-07-17 MED ORDER — GADOBENATE DIMEGLUMINE 529 MG/ML IV SOLN
14.0000 mL | Freq: Once | INTRAVENOUS | Status: AC | PRN
  Administered 2021-07-17: 14 mL via INTRAVENOUS

## 2021-07-27 ENCOUNTER — Encounter: Payer: Self-pay | Admitting: Cardiology

## 2021-07-27 DIAGNOSIS — E7800 Pure hypercholesterolemia, unspecified: Secondary | ICD-10-CM

## 2021-07-27 DIAGNOSIS — I2510 Atherosclerotic heart disease of native coronary artery without angina pectoris: Secondary | ICD-10-CM

## 2021-07-31 NOTE — Addendum Note (Signed)
Addended by: Beatrix Fetters on: 07/31/2021 08:10 AM   Modules accepted: Orders

## 2021-08-03 ENCOUNTER — Other Ambulatory Visit: Payer: Self-pay | Admitting: Cardiology

## 2021-08-11 DIAGNOSIS — D490 Neoplasm of unspecified behavior of digestive system: Secondary | ICD-10-CM | POA: Diagnosis not present

## 2021-09-08 DIAGNOSIS — I10 Essential (primary) hypertension: Secondary | ICD-10-CM | POA: Diagnosis not present

## 2021-09-08 DIAGNOSIS — I5022 Chronic systolic (congestive) heart failure: Secondary | ICD-10-CM | POA: Diagnosis not present

## 2021-09-08 DIAGNOSIS — Z951 Presence of aortocoronary bypass graft: Secondary | ICD-10-CM | POA: Diagnosis not present

## 2021-09-08 DIAGNOSIS — E7800 Pure hypercholesterolemia, unspecified: Secondary | ICD-10-CM | POA: Diagnosis not present

## 2021-09-08 DIAGNOSIS — I2510 Atherosclerotic heart disease of native coronary artery without angina pectoris: Secondary | ICD-10-CM | POA: Diagnosis not present

## 2021-09-09 LAB — HEPATIC FUNCTION PANEL
ALT: 16 IU/L (ref 0–32)
AST: 20 IU/L (ref 0–40)
Albumin: 4.2 g/dL (ref 3.7–4.7)
Alkaline Phosphatase: 96 IU/L (ref 44–121)
Bilirubin Total: 0.4 mg/dL (ref 0.0–1.2)
Bilirubin, Direct: 0.12 mg/dL (ref 0.00–0.40)
Total Protein: 6.6 g/dL (ref 6.0–8.5)

## 2021-09-09 LAB — BASIC METABOLIC PANEL
BUN/Creatinine Ratio: 15 (ref 12–28)
BUN: 13 mg/dL (ref 8–27)
CO2: 26 mmol/L (ref 20–29)
Calcium: 10.2 mg/dL (ref 8.7–10.3)
Chloride: 107 mmol/L — ABNORMAL HIGH (ref 96–106)
Creatinine, Ser: 0.84 mg/dL (ref 0.57–1.00)
Glucose: 93 mg/dL (ref 70–99)
Potassium: 5.1 mmol/L (ref 3.5–5.2)
Sodium: 144 mmol/L (ref 134–144)
eGFR: 72 mL/min/{1.73_m2} (ref >59)

## 2021-09-09 LAB — TSH: TSH: 2.57 u[IU]/mL (ref 0.450–4.500)

## 2021-09-09 LAB — LIPID PANEL
Chol/HDL Ratio: 2.5 ratio (ref 0.0–4.4)
Cholesterol, Total: 158 mg/dL (ref 100–199)
HDL: 62 mg/dL (ref >39)
LDL Chol Calc (NIH): 78 mg/dL (ref 0–99)
Triglycerides: 102 mg/dL (ref 0–149)
VLDL Cholesterol Cal: 18 mg/dL (ref 5–40)

## 2021-09-09 LAB — HEMOGLOBIN A1C
Est. average glucose Bld gHb Est-mCnc: 126 mg/dL
Hgb A1c MFr Bld: 6 % — ABNORMAL HIGH (ref 4.8–5.6)

## 2021-09-11 NOTE — Progress Notes (Unsigned)
Cardiology Office Note    Date:  09/11/2021   ID:  Kelli Roy, DOB 12-11-1945, MRN [id number]  PCP:  Pablo Lawrence, NP  Cardiologist:  Dr. Martinique  No chief complaint on file.   History of Present Illness:  Kelli Roy is a 76 y.o. female is seen for follow up.  She has a PMH of CAD, chronic systolic heart failure, hypertension, hyperlipidemia, and history of ischemic cardiomyopathy.  Patient had anterior STEMI in September 2015 and underwent emergent stenting of the proximal LAD with a BMS.  She also had 50 to 70% left main disease, 70 to 80% ostial LAD disease, 50 to 70% ostial left circumflex disease, EF was 25 to 30%.  She was placed on high-dose Lipitor therapy but noted to have marked elevation of her transaminases and her statin was discontinued.  Since then, she has been tolerating Crestor.  She eventually returned in November 2015 for CABG by Dr. Cyndia Bent was LIMA to LAD, SVG to diagonal, SVG to OM, SVG to RCA.  She has a history of a rash reaction to both Brilinta and Plavix.  Echocardiogram in February 2016 showed EF 35 to 40% with akinesis to the anteroseptum and apex.  She developed acute heart failure in March 2016.  She was diuresed with IV Lasix and it was discharged home with addition of Lasix and Aldactone.  She later developed hypotension requiring the discontinuation of Coreg and Aldactone.  She had a Myoview in January 2017 that showed scar in the anteroseptal, apical and inferolateral segment without ischemia, EF 49%.  She was seen on 12/02/2017, she was having a very atypical chest discomfort.  She sought medical attention in [facility] emergency room after having persistent chest soreness for several hours.  Troponin was negative at the time.  EKG showed no significant changes.  Given the atypical nature of her symptoms no further work up recommended.  She was seen in the ED on 05/06/19 with chest pain and tightness. Ecg showed no acute change and HS  troponin negative x 2. She states she was standing at the sink and got really weak- didn't feel well. She was nauseated. She had some tightening in her chest. Still felt poorly the next day and went to the ED. Gradually she has felt better this week. No more chest pressure. Only SOB when carries laundry up stairs. Myoview was done and showed an area of scar. No ischemia. Unchanged from 2017. EF 49%.   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 EGD done showing gastric inflammation and non bleeding gastric ulcers. Pancreatic cyst biopsied. Bx was apparently benign. Was placed on Pepcid.   On follow up today she is doing well from a cardiac standpoint. She is concerned that she has some low BP readings. Feels lightheaded and fuzzy when this happens. BP down to 87/46. Has occasional high readings at 160-170.    Past Medical History:  Diagnosis Date   Allergy    Alopecia    Arthritis    CAD (coronary artery disease)    a. 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 Cyndia Bent    Cataract    Chronic systolic CHF (congestive heart failure) ([place])  a. 03/2014 Echo: EF 25-30%; Pre op TEE 05/2014 with EF of 50%   Family history of cardiovascular disease    Family history of colon cancer    History of long-term treatment with high-risk medication    Hyperlipidemia    Hypertension    Insomnia    Ischemic cardiomyopathy    a. 03/2014 Echo: EF 25-30%, mid-apical/anteroseptal and inf AK, Gr 2 DD, ? apical thrombus, Mild MR.-->Discharged with LifeVest.   Malaise and fatigue    Myocardial infarction ([place]) 03/2014   Shingles 09/18/2019   Shortness of breath dyspnea    Sinus headache     Past Surgical History:  Procedure Laterality Date   BIOPSY  07/03/2020   Procedure:  BIOPSY;  Surgeon: Irving Copas., MD;  Location: [facility];  Service: Gastroenterology;;   CARDIAC CATHETERIZATION  2001   showed no blockages   CATARACT EXTRACTION W/PHACO Left 01/04/2014   Procedure: CATARACT EXTRACTION PHACO AND INTRAOCULAR LENS PLACEMENT ([place]);  Surgeon: Tonny Branch, MD;  Location: [facility];  Service: Ophthalmology;  Laterality: Left;  CDE:5.65   CATARACT EXTRACTION W/PHACO Right 01/29/2014   Procedure: CATARACT EXTRACTION PHACO AND INTRAOCULAR LENS PLACEMENT RIGHT EYE CDE=4.49;  Surgeon: Tonny Branch, MD;  Location: [facility];  Service: Ophthalmology;  Laterality: Right;   CERVICAL SPINE SURGERY  09/27/2003   C6-67 servical fusion   COLONOSCOPY WITH PROPOFOL N/A 05/18/2016   Procedure: COLONOSCOPY WITH PROPOFOL;  Surgeon: Garlan Fair, MD;  Location: [facility];  Service: Endoscopy;  Laterality: N/A;   CORONARY ARTERY BYPASS GRAFT N/A 05/21/2014   Procedure: CORONARY ARTERY BYPASS GRAFTING (CABG);  Surgeon: Gaye Pollack, MD;  Location: [place];  Service: Open Heart Surgery;  Laterality: N/A;   CYSTECTOMY  1998   fibroid cysts   DILATION AND CURETTAGE OF UTERUS  1976   2nd to miscarriage   ESOPHAGOGASTRODUODENOSCOPY (EGD) WITH PROPOFOL N/A 07/03/2020   Procedure: ESOPHAGOGASTRODUODENOSCOPY (EGD) WITH PROPOFOL;  Surgeon: Rush Landmark Telford Nab., MD;  Location: [facility];  Service: Gastroenterology;  Laterality: N/A;   EUS N/A 07/03/2020   Procedure: UPPER ENDOSCOPIC ULTRASOUND (EUS) RADIAL;  Surgeon: Irving Copas., MD;  Location: [facility];  Service: Gastroenterology;  Laterality: N/A;   EYE SURGERY     FINE NEEDLE ASPIRATION  07/03/2020   Procedure: FINE NEEDLE ASPIRATION;  Surgeon: Rush Landmark Telford Nab., MD;  Location: [facility];  Service: Gastroenterology;;   INTRAOPERATIVE TRANSESOPHAGEAL ECHOCARDIOGRAM N/A 05/21/2014   Procedure: INTRAOPERATIVE TRANSESOPHAGEAL ECHOCARDIOGRAM;  Surgeon: Gaye Pollack, MD;  Location: [facility] OR;  Service: Open  Heart Surgery;  Laterality: N/A;   LEFT HEART CATHETERIZATION WITH CORONARY ANGIOGRAM N/A 03/08/2014   Procedure: LEFT HEART CATHETERIZATION WITH CORONARY ANGIOGRAM;  Surgeon: Amalee Olsen M Martinique, MD;  Location: [facility] CATH LAB;  Service: Cardiovascular;  Laterality: N/A;   MASTOIDECTOMY  1990   and eardrum repair (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      Ascorbic Acid (VITAMIN C) 1000 MG tablet Take 1,000 mg by mouth daily.     aspirin EC 81 MG tablet Take 81 mg by mouth at bedtime.  Cholecalciferol (VITAMIN D3) 50 MCG (2000 UT) capsule Take 2,000 Units by mouth daily.     Coenzyme Q10-Vitamin E (QUNOL ULTRA COQ10 PO) Take 200 mg by mouth at bedtime. Liquid     Cyanocobalamin (VITAMIN B12 PO) Take 3,000 mcg by mouth daily.     docusate sodium (COLACE) 100 MG capsule Take 100-200 mg by mouth See admin instructions. Take 100 mg by mouth in the morning and 200 mg at bedtime     eszopiclone (LUNESTA) 1 MG TABS tablet Take immediately before bedtime (Patient taking differently: Take 0.5-1 mg by mouth at bedtime as needed for sleep.) 30 tablet 2   famotidine (PEPCID) 20 MG tablet TAKE 1 TABLET BY MOUTH TWICE A DAY 180 tablet 2   furosemide (LASIX) 20 MG tablet TAKE 1 TABLET BY MOUTH EVERY DAY - MAY TAKE 1 ADDITIONAL TABLET AS NEEDED 180 tablet 2   ipratropium (ATROVENT) 0.06 % nasal spray Place 2 sprays into both nostrils daily as needed for rhinitis.     loratadine (CLARITIN) 10 MG tablet Take 10 mg by mouth daily as needed for allergies or rhinitis.     losartan (COZAAR) 50 MG tablet  TAKE 1 TABLET BY MOUTH EVERY DAY 90 tablet 3   Magnesium Cl-Calcium Carbonate (SLOW-MAG PO) Take 1 tablet by mouth 2 (two) times daily.     Multiple Vitamins-Minerals (PRESERVISION/LUTEIN PO) Take by mouth.     mupirocin ointment (BACTROBAN) 2 % 3 (three) times daily as needed.     Na Sulfate-K Sulfate-Mg Sulf (SUPREP BOWEL PREP KIT) 17.5-3.13-1.6 GM/177ML SOLN Take 1 kit by mouth as directed. For colonoscopy prep (Patient not taking: Reported on 02/19/2021) 354 mL 0   nitroGLYCERIN (NITROSTAT) 0.4 MG SL tablet Place 1 tablet (0.4 mg total) under the tongue every 5 (five) minutes as needed for chest pain. 25 tablet 3   rosuvastatin (CRESTOR) 40 MG tablet Take 1 tablet (40 mg total) by mouth daily. (Patient taking differently: Take 40 mg by mouth at bedtime.) 90 tablet 3   tetrahydrozoline-zinc (VISINE-AC) 0.05-0.25 % ophthalmic solution Place 2 drops into both eyes daily as needed (for dry or irritated eyes).      trolamine salicylate (ASPERCREME/ALOE) 10 % cream Apply 1 application topically daily as needed (bursitis).     No facility-administered medications prior to visit.     Allergies:   Carvedilol, Spironolactone, Demerol [meperidine], Lipitor [atorvastatin], Lisinopril, Dexilant [dexlansoprazole], Latex, Neomycin, Plavix [clopidogrel bisulfate], Sulfonamide derivatives, and Ticagrelor   Social History   Socioeconomic History   Marital status: Married    Spouse name: Not on file   Number of children: Not on file   Years of education: Not on file   Highest education level: Not on file  Occupational History   Not on file  Tobacco Use   Smoking status: Never   Smokeless tobacco: Never  Vaping Use   Vaping Use: Never used  Substance and Sexual Activity   Alcohol use: Not Currently    Comment: rare glass of wine   Drug use: No   Sexual activity: Not Currently    Birth control/protection: Post-menopausal  Other Topics Concern   Not on file  Social History Narrative   Not on file    Social Determinants of Health   Financial Resource Strain: Not on file  Food Insecurity: Not on file  Transportation Needs: Not on file  Physical Activity: Not on file  Stress: Not on file  Social Connections: Not on file     Family  History:  The patient's family history includes Atrial fibrillation in her father; Cancer in her brother, brother, and father; Colon cancer in her father; Coronary artery disease in her brother and brother; Hearing loss in her father; Heart attack in her mother; Heart disease in her mother; Hypertension in her brother, brother, brother, brother, and father; Pancreatic cancer in her brother; Peripheral vascular disease in her father; Stroke in her mother; Thyroid disease in her brother, father, and sister.   ROS:   Please see the history of present illness.    ROS All other systems reviewed and are negative.   PHYSICAL EXAM:   VS:  There were no vitals taken for this visit.   GEN: Well nourished, well developed, in no acute distress  HEENT: normal  Neck: no JVD, carotid bruits, or masses Cardiac: RRR; no murmurs, rubs, or gallops,no edema  Respiratory:  clear to auscultation bilaterally, normal work of breathing GI: soft, nontender, nondistended, + BS MS: no deformity or atrophy  Skin: warm and dry, no rash Neuro:  Alert and Oriented x 3, Strength and sensation are intact Psych: euthymic mood, full affect  Wt Readings from Last 3 Encounters:  02/19/21 154 lb (69.9 kg)  10/24/20 149 lb (67.6 kg)  08/22/20 149 lb 4 oz (67.7 kg)      Studies/Labs Reviewed:   EKG:  EKG is  not ordered today.     Recent Labs: 09/08/2021: ALT 16; BUN 13; Creatinine, Ser 0.84; Potassium 5.1; Sodium 144; TSH 2.570   Lipid Panel    Component Value Date/Time   CHOL 158 09/08/2021 0957   TRIG 102 09/08/2021 0957   HDL 62 09/08/2021 0957   CHOLHDL 2.5 09/08/2021 0957   CHOLHDL 2.3 04/19/2020 1536   VLDL 25 07/14/2016 1211   LDLCALC 78 09/08/2021 0957   LDLCALC  69 04/19/2020 1536    Additional studies/ records that were reviewed today include:   Echo 08/30/2014 LV EF: 35% -   40% Study Conclusions  - Left ventricle: Poor acoustical windows with multiple wall motion   abnormalities. Recommend limited study with definity contrast   agent for more accurate assessment. There is distal septal   akinesis. The cavity size was normal. Systolic function was   moderately reduced. The estimated ejection fraction was in the   range of 35% to 40%. There is akinesis of the apical myocardium.   There is akinesis of the apicalanterior myocardium. There is   akinesis of the midanteroseptal myocardium. There is akinesis of   the apicallateral myocardium. There was a reduced contribution of   atrial contraction to ventricular filling, due to increased   ventricular diastolic pressure or atrial contractile dysfunction.   Doppler parameters are consistent with a reversible restrictive   pattern, indicative of decreased left ventricular diastolic   compliance and/or increased left atrial pressure (grade 3   diastolic dysfunction). - Aortic valve: Trileaflet; normal thickness, mildly calcified   leaflets. - Mitral valve: There was mild regurgitation. - Left atrium: The atrium was mildly dilated. - Pulmonic valve: There was mild regurgitation.   Myoview 07/10/2015 Study Highlights   Nuclear stress EF: 49%. The left ventricular ejection fraction is mildly decreased (45-54%). There was no ST segment deviation noted during stress. Defect 1: There is a defect present in the mid anteroseptal, mid inferolateral, apical septal, apical lateral and apex location. Findings consistent with prior myocardial infarction. This is a low risk study.   1. Low risk study 2. Scar antero septal, apical and  inferolateral. 3. Low nl EF   Myoview 05/23/19: Study Highlights    The left ventricular ejection fraction is mildly decreased (45-54%). Nuclear stress EF: 49%. No T  wave inversion was noted during stress. There was no ST segment deviation noted during stress. Defect 1: There is a large defect of severe severity present in the mid anterior, mid anteroseptal, mid inferolateral, apical anterior, apical septal, apical inferior, apical lateral and apex location. Findings consistent with prior myocardial infarction. This is a low risk study.   Large size, severe severity fixed distal anteroseptal, anterior, apical, inferoapical and apical lateral perfusion defect suggestive of scar without significant reversible ischemia (SDS 1). LVEF 49% with distal anterior, anteroseptal and apical akinesis. This is an intermediate risk study. No change compared to prior study in 2017.     ASSESSMENT:    No diagnosis found.    PLAN:  In order of problems listed above:  HTN. Readings are generally well controlled but she has some low BP readings that are symptomatic. Recommend reducing lasix to every other day. If no swelling may eventually come off lasix altogether.  Continue losartan therapy.  2.   CAD: Last Myoview Nov 2020- stable. She has stable class 1 angina.  3.   Chronic systolic heart failure: appears well compensated. Last EF 49%. Adjust lasix as above.  4.   Hyperlipidemia: LDL improved to 69 with increased Crestor dose.   5.   Ischemic cardiomyopathy: On losartan, unable to tolerate beta-blocker due to baseline bradycardia.  6. Pancreatic cyst - per GI  7. Gastric ulcers. On Pepcid  Follow up 6 months with lab. Medication Adjustments/Labs and Tests Ordered: Current medicines are reviewed at length with the patient today.  Concerns regarding medicines are outlined above.  Medication changes, Labs and Tests ordered today are listed in the Patient Instructions below. There are no Patient Instructions on file for this visit.   Signed, Dayten Juba Martinique, MD  09/11/2021 8:23 AM    [facility] [place], [place], [place]   [address] Phone: [phone]; Fax: [phone]

## 2021-09-17 ENCOUNTER — Encounter: Payer: Self-pay | Admitting: Cardiology

## 2021-09-17 ENCOUNTER — Other Ambulatory Visit: Payer: Self-pay

## 2021-09-17 ENCOUNTER — Ambulatory Visit: Payer: PPO | Admitting: Cardiology

## 2021-09-17 VITALS — BP 160/70 | HR 63 | Ht 65.5 in | Wt 153.2 lb

## 2021-09-17 DIAGNOSIS — I2510 Atherosclerotic heart disease of native coronary artery without angina pectoris: Secondary | ICD-10-CM | POA: Diagnosis not present

## 2021-09-17 DIAGNOSIS — I5022 Chronic systolic (congestive) heart failure: Secondary | ICD-10-CM

## 2021-09-17 DIAGNOSIS — E7800 Pure hypercholesterolemia, unspecified: Secondary | ICD-10-CM | POA: Diagnosis not present

## 2021-09-17 DIAGNOSIS — I10 Essential (primary) hypertension: Secondary | ICD-10-CM | POA: Diagnosis not present

## 2021-09-17 MED ORDER — EZETIMIBE 10 MG PO TABS: 10.0000 mg | ORAL_TABLET | Freq: Every day | ORAL | 3 refills | Status: DC

## 2021-09-17 MED ORDER — FUROSEMIDE 20 MG PO TABS: 20.0000 mg | ORAL_TABLET | Freq: Every day | ORAL | 2 refills | Status: DC

## 2021-09-17 NOTE — Patient Instructions (Signed)
Reduce your salt intake ? ?Add Zetia 10 mg daily for cholesterol  ? ? ?

## 2021-09-18 ENCOUNTER — Other Ambulatory Visit: Payer: Self-pay | Admitting: Gastroenterology

## 2021-09-30 DIAGNOSIS — M5137 Other intervertebral disc degeneration, lumbosacral region: Secondary | ICD-10-CM | POA: Diagnosis not present

## 2021-09-30 DIAGNOSIS — K862 Cyst of pancreas: Secondary | ICD-10-CM | POA: Diagnosis not present

## 2021-09-30 DIAGNOSIS — F411 Generalized anxiety disorder: Secondary | ICD-10-CM | POA: Diagnosis not present

## 2021-09-30 DIAGNOSIS — I5022 Chronic systolic (congestive) heart failure: Secondary | ICD-10-CM | POA: Diagnosis not present

## 2021-10-03 ENCOUNTER — Encounter: Payer: Self-pay | Admitting: Cardiology

## 2021-10-03 MED ORDER — NITROGLYCERIN 0.4 MG SL SUBL: 0.4000 mg | SUBLINGUAL_TABLET | SUBLINGUAL | 3 refills | Status: DC | PRN

## 2021-12-12 ENCOUNTER — Other Ambulatory Visit: Payer: Self-pay | Admitting: Gastroenterology

## 2021-12-12 ENCOUNTER — Other Ambulatory Visit: Payer: Self-pay | Admitting: Cardiology

## 2021-12-22 DIAGNOSIS — I5022 Chronic systolic (congestive) heart failure: Secondary | ICD-10-CM | POA: Diagnosis not present

## 2021-12-22 DIAGNOSIS — E7800 Pure hypercholesterolemia, unspecified: Secondary | ICD-10-CM | POA: Diagnosis not present

## 2021-12-22 DIAGNOSIS — I2510 Atherosclerotic heart disease of native coronary artery without angina pectoris: Secondary | ICD-10-CM | POA: Diagnosis not present

## 2021-12-22 DIAGNOSIS — I10 Essential (primary) hypertension: Secondary | ICD-10-CM | POA: Diagnosis not present

## 2021-12-23 LAB — LIPID PANEL
Chol/HDL Ratio: 2.1 ratio (ref 0.0–4.4)
Cholesterol, Total: 117 mg/dL (ref 100–199)
HDL: 57 mg/dL (ref >39)
LDL Chol Calc (NIH): 43 mg/dL (ref 0–99)
Triglycerides: 88 mg/dL (ref 0–149)
VLDL Cholesterol Cal: 17 mg/dL (ref 5–40)

## 2021-12-23 LAB — HEPATIC FUNCTION PANEL
ALT: 22 IU/L (ref 0–32)
AST: 24 IU/L (ref 0–40)
Albumin: 4.2 g/dL (ref 3.7–4.7)
Alkaline Phosphatase: 92 IU/L (ref 44–121)
Bilirubin Total: 0.6 mg/dL (ref 0.0–1.2)
Bilirubin, Direct: 0.19 mg/dL (ref 0.00–0.40)
Total Protein: 6.1 g/dL (ref 6.0–8.5)

## 2021-12-31 ENCOUNTER — Telehealth: Payer: Self-pay | Admitting: Gastroenterology

## 2021-12-31 NOTE — Telephone Encounter (Signed)
Inbound call from patient Inquiring if she should continue taking Famotidine medication. Patient states she no longer has anymore refills . Please give patient a call back to advise.  Thank you

## 2022-01-01 DIAGNOSIS — L578 Other skin changes due to chronic exposure to nonionizing radiation: Secondary | ICD-10-CM | POA: Diagnosis not present

## 2022-01-01 DIAGNOSIS — D692 Other nonthrombocytopenic purpura: Secondary | ICD-10-CM | POA: Diagnosis not present

## 2022-01-01 DIAGNOSIS — Z86018 Personal history of other benign neoplasm: Secondary | ICD-10-CM | POA: Diagnosis not present

## 2022-01-01 DIAGNOSIS — L821 Other seborrheic keratosis: Secondary | ICD-10-CM | POA: Diagnosis not present

## 2022-01-01 DIAGNOSIS — Z85828 Personal history of other malignant neoplasm of skin: Secondary | ICD-10-CM | POA: Diagnosis not present

## 2022-01-01 DIAGNOSIS — D225 Melanocytic nevi of trunk: Secondary | ICD-10-CM | POA: Diagnosis not present

## 2022-01-02 NOTE — Telephone Encounter (Signed)
Returned call to pt. Pt advised that she can take Famotidine prn. Pt states that she will purchase OTC if needed.

## 2022-01-09 ENCOUNTER — Other Ambulatory Visit: Payer: Self-pay | Admitting: Gastroenterology

## 2022-03-17 ENCOUNTER — Encounter: Payer: Self-pay | Admitting: Cardiology

## 2022-03-20 NOTE — Progress Notes (Unsigned)
Cardiology Office Note    Date:  03/24/2022   ID:  Kelli Roy, DOB [birth year], MRN [id number]  PCP:  Pablo Lawrence, NP  Cardiologist:  Dr. Martinique  Chief Complaint  Patient presents with   Coronary Artery Disease    History of Present Illness:  Kelli Roy is a 76 y.o. female is seen for follow up.  She has a PMH of CAD, chronic systolic heart failure, hypertension, hyperlipidemia, and history of ischemic cardiomyopathy.  Patient had anterior STEMI in September 2015 and underwent emergent stenting of the proximal LAD with a BMS.  She also had 50 to 70% left main disease, 70 to 80% ostial LAD disease, 50 to 70% ostial left circumflex disease, EF was 25 to 30%.  She was placed on high-dose Lipitor therapy but noted to have marked elevation of her transaminases and her statin was discontinued.  Since then, she has been tolerating Crestor.  She eventually returned in November 2015 for CABG by Dr. Cyndia Bent was LIMA to LAD, SVG to diagonal, SVG to OM, SVG to RCA.  She has a history of a rash reaction to both Brilinta and Plavix.  Echocardiogram in February 2016 showed EF 35 to 40% with akinesis to the anteroseptum and apex.  She developed acute heart failure in March 2016.  She was diuresed with IV Lasix and it was discharged home with addition of Lasix and Aldactone.  She later developed hypotension requiring the discontinuation of Coreg and Aldactone.  She had a Myoview in January 2017 that showed scar in the anteroseptal, apical and inferolateral segment without ischemia, EF 49%.  She was seen on 12/02/2017, she was having a very atypical chest discomfort.  She sought medical attention in [facility] emergency room after having persistent chest soreness for several hours.  Troponin was negative at the time.  EKG showed no significant changes.  Given the atypical nature of her symptoms no further work up recommended.  She was seen in the ED on 05/06/19 with chest pain and tightness.  Ecg showed no acute change and HS troponin negative x 2.  Myoview was done and showed an area of scar. No ischemia. Unchanged from 2017. EF 49%.   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 EGD done showing gastric inflammation and non bleeding gastric ulcers. Pancreatic cyst biopsied. Bx was apparently benign. Was placed on Pepcid. MRI in January cyst was stable.   On follow up today she is doing well from a cardiac standpoint. Reports BP doing very well at home. She completed cardiac rehab. Notes some low back problems radiating to thigh. Worse if standing a long time.   Past Medical History:  Diagnosis Date   Allergy    Alopecia    Arthritis    CAD (coronary artery disease)    a. 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 Cyndia Bent    Cataract    Chronic systolic CHF (congestive heart failure) ([place])    a. 03/2014 Echo: EF 25-30%; Pre op TEE 05/2014 with EF of 50%   Family history of cardiovascular disease    Family history of colon cancer    History of long-term treatment with high-risk medication    Hyperlipidemia  Hypertension    Insomnia    Ischemic cardiomyopathy    a. 03/2014 Echo: EF 25-30%, mid-apical/anteroseptal and inf AK, Gr 2 DD, ? apical thrombus, Mild MR.-->Discharged with LifeVest.   Malaise and fatigue    Myocardial infarction ([place]) 03/2014   Shingles 09/18/2019   Shortness of breath dyspnea    Sinus headache     Past Surgical History:  Procedure Laterality Date   BIOPSY  07/03/2020   Procedure: BIOPSY;  Surgeon: Irving Copas., MD;  Location: [facility];  Service: Gastroenterology;;   CARDIAC CATHETERIZATION  2001   showed no blockages   CATARACT EXTRACTION W/PHACO Left 01/04/2014   Procedure: CATARACT EXTRACTION PHACO AND  INTRAOCULAR LENS PLACEMENT ([place]);  Surgeon: Tonny Branch, MD;  Location: [facility];  Service: Ophthalmology;  Laterality: Left;  CDE:5.65   CATARACT EXTRACTION W/PHACO Right 01/29/2014   Procedure: CATARACT EXTRACTION PHACO AND INTRAOCULAR LENS PLACEMENT RIGHT EYE CDE=4.49;  Surgeon: Tonny Branch, MD;  Location: [facility];  Service: Ophthalmology;  Laterality: Right;   CERVICAL SPINE SURGERY  09/27/2003   C6-67 servical fusion   COLONOSCOPY WITH PROPOFOL N/A 05/18/2016   Procedure: COLONOSCOPY WITH PROPOFOL;  Surgeon: Garlan Fair, MD;  Location: [facility];  Service: Endoscopy;  Laterality: N/A;   CORONARY ARTERY BYPASS GRAFT N/A 05/21/2014   Procedure: CORONARY ARTERY BYPASS GRAFTING (CABG);  Surgeon: Gaye Pollack, MD;  Location: [facility];  Service: Open Heart Surgery;  Laterality: N/A;   CYSTECTOMY  1998   fibroid cysts   DILATION AND CURETTAGE OF UTERUS  1976   2nd to miscarriage   ESOPHAGOGASTRODUODENOSCOPY (EGD) WITH PROPOFOL N/A 07/03/2020   Procedure: ESOPHAGOGASTRODUODENOSCOPY (EGD) WITH PROPOFOL;  Surgeon: Rush Landmark Telford Nab., MD;  Location: [facility];  Service: Gastroenterology;  Laterality: N/A;   EUS N/A 07/03/2020   Procedure: UPPER ENDOSCOPIC ULTRASOUND (EUS) RADIAL;  Surgeon: Irving Copas., MD;  Location: [facility];  Service: Gastroenterology;  Laterality: N/A;   EYE SURGERY     FINE NEEDLE ASPIRATION  07/03/2020   Procedure: FINE NEEDLE ASPIRATION;  Surgeon: Rush Landmark Telford Nab., MD;  Location: [facility];  Service: Gastroenterology;;   INTRAOPERATIVE TRANSESOPHAGEAL ECHOCARDIOGRAM N/A 05/21/2014   Procedure: INTRAOPERATIVE TRANSESOPHAGEAL ECHOCARDIOGRAM;  Surgeon: Gaye Pollack, MD;  Location[facility];  Service: Open Heart Surgery;  Laterality: N/A;   LEFT HEART CATHETERIZATION WITH CORONARY ANGIOGRAM N/A 03/08/2014   Procedure: LEFT HEART CATHETERIZATION WITH CORONARY ANGIOGRAM;  Surgeon: Shamyah Stantz M Martinique, MD;  Location: [facility] CATH LAB;  Service: Cardiovascular;   Laterality: N/A;   MASTOIDECTOMY  1990   and eardrum repair (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      aspirin EC 81 MG tablet Take 81 mg by mouth at bedtime.      Cholecalciferol (VITAMIN D3) 50 MCG (2000 UT) capsule Take 2,000 Units by mouth daily.     Cyanocobalamin (VITAMIN B12 PO) Take 3,000 mcg by mouth daily.     docusate sodium (COLACE) 100 MG capsule Take 100-200 mg by mouth See admin instructions. Take 100 mg by mouth in the morning and 200 mg  at bedtime     ezetimibe (ZETIA) 10 MG tablet Take 1 tablet (10 mg total) by mouth daily. 90 tablet 3   famotidine (PEPCID) 20 MG tablet TAKE 1 TABLET BY MOUTH TWICE A DAY 180 tablet 0   furosemide (LASIX) 20 MG tablet Take 1 tablet (20 mg total) by mouth daily. 180 tablet 2   ipratropium (ATROVENT) 0.06 % nasal spray Place 2 sprays into both nostrils daily as needed for rhinitis.     loratadine (CLARITIN) 10 MG tablet Take 10 mg by mouth daily as needed for allergies or rhinitis.     losartan (COZAAR) 50 MG tablet TAKE 1 TABLET BY MOUTH EVERY DAY 90 tablet 3   Magnesium Cl-Calcium Carbonate (SLOW-MAG PO) Take 1 tablet by mouth 2 (two) times daily.     Multiple Vitamins-Minerals (PRESERVISION/LUTEIN PO) Take by mouth.     mupirocin ointment (BACTROBAN) 2 % 3 (three) times daily as needed.     nitroGLYCERIN (NITROSTAT) 0.4 MG SL tablet Place 1 tablet (0.4 mg total) under the tongue every 5 (five) minutes as needed for chest pain. 25 tablet 3   rosuvastatin (CRESTOR) 40 MG tablet Take 1 tablet (40 mg total) by mouth daily.  (Patient taking differently: Take 40 mg by mouth at bedtime.) 90 tablet 3   tetrahydrozoline-zinc (VISINE-AC) 0.05-0.25 % ophthalmic solution Place 2 drops into both eyes daily as needed (for dry or irritated eyes).      trolamine salicylate (ASPERCREME/ALOE) 10 % cream Apply 1 application topically daily as needed (bursitis).     Ascorbic Acid (VITAMIN C) 1000 MG tablet Take 1,000 mg by mouth daily. (Patient not taking: Reported on 03/24/2022)     Coenzyme Q10-Vitamin E (QUNOL ULTRA COQ10 PO) Take 200 mg by mouth at bedtime. Liquid (Patient not taking: Reported on 03/24/2022)     eszopiclone (LUNESTA) 1 MG TABS tablet Take immediately before bedtime (Patient not taking: Reported on 03/24/2022) 30 tablet 2   No facility-administered medications prior to visit.     Allergies:   Carvedilol, Spironolactone, Clopidogrel, Demerol [meperidine], Lipitor [atorvastatin], Lisinopril, Meperidine hcl, Sulfamethoxazole, Thimerosal, Dexilant [dexlansoprazole], Latex, Neomycin, Plavix [clopidogrel bisulfate], Sulfonamide derivatives, and Ticagrelor   Social History   Socioeconomic History   Marital status: Married    Spouse name: Not on file   Number of children: Not on file   Years of education: Not on file   Highest education level: Not on file  Occupational History   Not on file  Tobacco Use   Smoking status: Never   Smokeless tobacco: Never  Vaping Use   Vaping Use: Never used  Substance and Sexual Activity   Alcohol use: Not Currently    Comment: rare glass of wine   Drug use: No   Sexual activity: Not Currently    Birth control/protection: Post-menopausal  Other Topics Concern   Not on file  Social History Narrative   Not on file   Social Determinants of Health   Financial Resource Strain: Low Risk  (04/19/2020)   Overall Financial Resource Strain (CARDIA)    Difficulty of Paying Living Expenses: Not very hard  Food Insecurity: Not on file  Transportation Needs: Not on file  Physical  Activity: Not on file  Stress: Not on file  Social Connections: Not on file     Family History:  The patient's family history includes Atrial fibrillation in her father; Cancer in her brother, brother, and father; Colon cancer in her father; Coronary artery disease in her brother and  brother; Hearing loss in her father; Heart attack in her mother; Heart disease in her mother; Hypertension in her brother, brother, brother, brother, and father; Pancreatic cancer in her brother; Peripheral vascular disease in her father; Stroke in her mother; Thyroid disease in her brother, father, and sister.   ROS:   Please see the history of present illness.    ROS All other systems reviewed and are negative.   PHYSICAL EXAM:   VS:  BP (!) 140/60   Pulse (!) 49   Ht 5' 5.5" (1.664 m)   Wt 148 lb (67.1 kg)   SpO2 98%   BMI 24.25 kg/m    GEN: Well nourished, well developed, in no acute distress  HEENT: normal  Neck: no JVD, carotid bruits, or masses Cardiac: RRR; no murmurs, rubs, or gallops,no edema  Respiratory:  clear to auscultation bilaterally, normal work of breathing GI: soft, nontender, nondistended, + BS MS: no deformity or atrophy  Skin: warm and dry, no rash Neuro:  Alert and Oriented x 3, Strength and sensation are intact Psych: euthymic mood, full affect  Wt Readings from Last 3 Encounters:  03/24/22 148 lb (67.1 kg)  09/17/21 153 lb 3.2 oz (69.5 kg)  02/19/21 154 lb (69.9 kg)      Studies/Labs Reviewed:   EKG:  EKG is  ordered today. NSR rate 49. Old septal infarct. I have personally reviewed and interpreted this study.  Recent Labs: 09/08/2021: BUN 13; Creatinine, Ser 0.84; Potassium 5.1; Sodium 144; TSH 2.570 12/22/2021: ALT 22   Lipid Panel    Component Value Date/Time   CHOL 117 12/22/2021 1034   TRIG 88 12/22/2021 1034   HDL 57 12/22/2021 1034   CHOLHDL 2.1 12/22/2021 1034   CHOLHDL 2.3 04/19/2020 1536   VLDL 25 07/14/2016 1211   LDLCALC 43 12/22/2021 1034    LDLCALC 69 04/19/2020 1536    Additional studies/ records that were reviewed today include:   Echo 08/30/2014 LV EF: 35% -   40% Study Conclusions  - Left ventricle: Poor acoustical windows with multiple wall motion   abnormalities. Recommend limited study with definity contrast   agent for more accurate assessment. There is distal septal   akinesis. The cavity size was normal. Systolic function was   moderately reduced. The estimated ejection fraction was in the   range of 35% to 40%. There is akinesis of the apical myocardium.   There is akinesis of the apicalanterior myocardium. There is   akinesis of the midanteroseptal myocardium. There is akinesis of   the apicallateral myocardium. There was a reduced contribution of   atrial contraction to ventricular filling, due to increased   ventricular diastolic pressure or atrial contractile dysfunction.   Doppler parameters are consistent with a reversible restrictive   pattern, indicative of decreased left ventricular diastolic   compliance and/or increased left atrial pressure (grade 3   diastolic dysfunction). - Aortic valve: Trileaflet; normal thickness, mildly calcified   leaflets. - Mitral valve: There was mild regurgitation. - Left atrium: The atrium was mildly dilated. - Pulmonic valve: There was mild regurgitation.   Myoview 07/10/2015 Study Highlights   Nuclear stress EF: 49%. The left ventricular ejection fraction is mildly decreased (45-54%). There was no ST segment deviation noted during stress. Defect 1: There is a defect present in the mid anteroseptal, mid inferolateral, apical septal, apical lateral and apex location. Findings consistent with prior myocardial infarction. This is a low risk study.   1. Low risk study 2. Scar  antero septal, apical and inferolateral. 3. Low nl EF   Myoview 05/23/19: Study Highlights    The left ventricular ejection fraction is mildly decreased (45-54%). Nuclear stress EF:  49%. No T wave inversion was noted during stress. There was no ST segment deviation noted during stress. Defect 1: There is a large defect of severe severity present in the mid anterior, mid anteroseptal, mid inferolateral, apical anterior, apical septal, apical inferior, apical lateral and apex location. Findings consistent with prior myocardial infarction. This is a low risk study.   Large size, severe severity fixed distal anteroseptal, anterior, apical, inferoapical and apical lateral perfusion defect suggestive of scar without significant reversible ischemia (SDS 1). LVEF 49% with distal anterior, anteroseptal and apical akinesis. This is an intermediate risk study. No change compared to prior study in 2017.     ASSESSMENT:    1. Coronary artery disease involving native coronary artery of native heart without angina pectoris   2. S/P CABG x 4   3. Essential hypertension   4. Pure hypercholesterolemia   5. Chronic systolic heart failure (HCC)      PLAN:  In order of problems listed above:  HTN. Now well controlled.  2.   CAD: Last Myoview Nov 2020- stable. No ischemia. EF 49%.  She has stable class 1 angina.  3.   Chronic systolic heart failure: appears well compensated. Last EF 49%. Volume status looks good.   4.   Hyperlipidemia: LDL improved to 78 on high dose Crestor. Added  Zetia 10 mg daily with LDL dropping to 43.    5.   Ischemic cardiomyopathy: On losartan, unable to tolerate beta-blocker due to baseline bradycardia.  6. Pancreatic cyst - per GI. MRI stable.   7. Gastric ulcers. On Pepcid    Medication Adjustments/Labs and Tests Ordered: Current medicines are reviewed at length with the patient today.  Concerns regarding medicines are outlined above.  Medication changes, Labs and Tests ordered today are listed in the Patient Instructions below. There are no Patient Instructions on file for this visit.    Signed, Jillianne Gamino Martinique, MD  03/24/2022 1:34 PM    [facility] [place], [place], [place]  [address] Phone: [phone]; Fax: [phone]

## 2022-03-24 ENCOUNTER — Encounter: Payer: Self-pay | Admitting: Cardiology

## 2022-03-24 ENCOUNTER — Ambulatory Visit: Payer: PPO | Attending: Cardiology | Admitting: Cardiology

## 2022-03-24 VITALS — BP 140/60 | HR 49 | Ht 65.5 in | Wt 148.0 lb

## 2022-03-24 DIAGNOSIS — I2510 Atherosclerotic heart disease of native coronary artery without angina pectoris: Secondary | ICD-10-CM | POA: Diagnosis not present

## 2022-03-24 DIAGNOSIS — Z951 Presence of aortocoronary bypass graft: Secondary | ICD-10-CM

## 2022-03-24 DIAGNOSIS — E7800 Pure hypercholesterolemia, unspecified: Secondary | ICD-10-CM | POA: Diagnosis not present

## 2022-03-24 DIAGNOSIS — I5022 Chronic systolic (congestive) heart failure: Secondary | ICD-10-CM

## 2022-03-24 DIAGNOSIS — I10 Essential (primary) hypertension: Secondary | ICD-10-CM | POA: Diagnosis not present

## 2022-04-03 ENCOUNTER — Other Ambulatory Visit (HOSPITAL_COMMUNITY): Payer: Self-pay | Admitting: Adult Health Nurse Practitioner

## 2022-04-03 ENCOUNTER — Other Ambulatory Visit: Payer: Self-pay | Admitting: Adult Health Nurse Practitioner

## 2022-04-03 DIAGNOSIS — K862 Cyst of pancreas: Secondary | ICD-10-CM | POA: Diagnosis not present

## 2022-04-03 DIAGNOSIS — Z0000 Encounter for general adult medical examination without abnormal findings: Secondary | ICD-10-CM | POA: Diagnosis not present

## 2022-04-03 DIAGNOSIS — R7303 Prediabetes: Secondary | ICD-10-CM | POA: Diagnosis not present

## 2022-04-03 DIAGNOSIS — Z23 Encounter for immunization: Secondary | ICD-10-CM | POA: Diagnosis not present

## 2022-04-09 ENCOUNTER — Ambulatory Visit (HOSPITAL_COMMUNITY)
Admission: RE | Admit: 2022-04-09 | Discharge: 2022-04-09 | Disposition: A | Discharge status: Home / Self Care | Payer: PPO | Source: Ambulatory Visit | Attending: Adult Health Nurse Practitioner | Admitting: Adult Health Nurse Practitioner

## 2022-04-09 ENCOUNTER — Other Ambulatory Visit (HOSPITAL_COMMUNITY): Payer: Self-pay | Admitting: Adult Health Nurse Practitioner

## 2022-04-09 DIAGNOSIS — R935 Abnormal findings on diagnostic imaging of other abdominal regions, including retroperitoneum: Secondary | ICD-10-CM | POA: Diagnosis not present

## 2022-04-09 DIAGNOSIS — R933 Abnormal findings on diagnostic imaging of other parts of digestive tract: Secondary | ICD-10-CM | POA: Diagnosis not present

## 2022-04-09 DIAGNOSIS — K862 Cyst of pancreas: Secondary | ICD-10-CM | POA: Diagnosis not present

## 2022-04-09 DIAGNOSIS — R932 Abnormal findings on diagnostic imaging of liver and biliary tract: Secondary | ICD-10-CM | POA: Diagnosis not present

## 2022-04-09 MED ORDER — GADOPICLENOL 0.5 MMOL/ML IV SOLN
6.7000 mL | Freq: Once | INTRAVENOUS | Status: AC | PRN
  Administered 2022-04-09: 6.7 mL via INTRAVENOUS

## 2022-05-06 ENCOUNTER — Other Ambulatory Visit: Payer: Self-pay | Admitting: Gastroenterology

## 2022-06-12 DIAGNOSIS — Z1231 Encounter for screening mammogram for malignant neoplasm of breast: Secondary | ICD-10-CM | POA: Diagnosis not present

## 2022-08-01 ENCOUNTER — Other Ambulatory Visit: Payer: Self-pay | Admitting: Gastroenterology

## 2022-08-20 ENCOUNTER — Other Ambulatory Visit: Payer: Self-pay | Admitting: Surgery

## 2022-08-20 DIAGNOSIS — D490 Neoplasm of unspecified behavior of digestive system: Secondary | ICD-10-CM

## 2022-08-29 ENCOUNTER — Other Ambulatory Visit: Payer: Self-pay | Admitting: Cardiology

## 2022-09-16 ENCOUNTER — Other Ambulatory Visit: Payer: Self-pay | Admitting: Cardiology

## 2022-10-08 ENCOUNTER — Other Ambulatory Visit: Payer: Self-pay | Admitting: Gastroenterology

## 2022-10-12 ENCOUNTER — Other Ambulatory Visit (HOSPITAL_COMMUNITY): Payer: Self-pay | Admitting: Adult Health Nurse Practitioner

## 2022-10-12 DIAGNOSIS — I5022 Chronic systolic (congestive) heart failure: Secondary | ICD-10-CM | POA: Diagnosis not present

## 2022-10-12 DIAGNOSIS — E782 Mixed hyperlipidemia: Secondary | ICD-10-CM | POA: Diagnosis not present

## 2022-10-12 DIAGNOSIS — M25552 Pain in left hip: Secondary | ICD-10-CM | POA: Diagnosis not present

## 2022-10-12 DIAGNOSIS — M8589 Other specified disorders of bone density and structure, multiple sites: Secondary | ICD-10-CM | POA: Diagnosis not present

## 2022-10-12 DIAGNOSIS — Z1330 Encounter for screening examination for mental health and behavioral disorders, unspecified: Secondary | ICD-10-CM | POA: Diagnosis not present

## 2022-10-12 DIAGNOSIS — I10 Essential (primary) hypertension: Secondary | ICD-10-CM | POA: Diagnosis not present

## 2022-10-12 DIAGNOSIS — Z1231 Encounter for screening mammogram for malignant neoplasm of breast: Secondary | ICD-10-CM

## 2022-10-27 NOTE — Progress Notes (Signed)
Cardiology Office Note    Date:  11/02/2022   ID:  Kelli Roy, DOB October 13, 1945, MRN [id number]  PCP:  Kelli Rutherford, NP  Cardiologist:  Dr. [place]  Chief Complaint  Patient presents with   Coronary Artery Disease    History of Present Illness:  Kelli Roy is a 77 y.o. female is seen for follow up.  She has a PMH of CAD, chronic systolic heart failure, hypertension, hyperlipidemia, and history of ischemic cardiomyopathy.  Patient had anterior STEMI in September 2015 and underwent emergent stenting of the proximal LAD with a BMS.  She also had 50 to 70% left main disease, 70 to 80% ostial LAD disease, 50 to 70% ostial left circumflex disease, EF was 25 to 30%.  She was placed on high-dose Lipitor therapy but noted to have marked elevation of her transaminases and her statin was discontinued.  Since then, she has been tolerating Crestor.  She eventually returned in November 2015 for CABG by Dr. Laneta Simmers was LIMA to LAD, SVG to diagonal, SVG to OM, SVG to RCA.  She has a history of a rash reaction to both Brilinta and Plavix.  Echocardiogram in February 2016 showed EF 35 to 40% with akinesis to the anteroseptum and apex.  She developed acute heart failure in March 2016.  She was diuresed with IV Lasix and it was discharged home with addition of Lasix and Aldactone.  She later developed hypotension requiring the discontinuation of Coreg and Aldactone.  She had a Myoview in January 2017 that showed scar in the anteroseptal, apical and inferolateral segment without ischemia, EF 49%.  She was seen on 12/02/2017, she was having a very atypical chest discomfort.  She sought medical attention in [facility] emergency room after having persistent chest soreness for several hours.  Troponin was negative at the time.  EKG showed no significant changes.  Given the atypical nature of her symptoms no further work up recommended.  She was seen in the ED on 05/06/19 with chest pain and tightness.  Ecg showed no acute change and HS troponin negative x 2.  Myoview was done and showed an area of scar. No ischemia. Unchanged from 2017. EF 49%.   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 EGD done showing gastric inflammation and non bleeding gastric ulcers. Pancreatic cyst biopsied. Bx was apparently benign. Was placed on Pepcid.   On follow up today she is doing well from a cardiac standpoint. Reports BP doing very well at home. She completed cardiac rehab. Notes some low back problems radiating to thigh. Worse if standing a long time. She has not been exercising as much. Has gained about 7 lbs. Notes husband retiring now. No chest tightness or pain.  Past Medical History:  Diagnosis Date   Allergy    Alopecia    Arthritis    CAD (coronary artery disease)    a. 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 Laneta Simmers    Cataract    Chronic systolic CHF (congestive heart failure) (HCC)    a. 03/2014 Echo: EF 25-30%; Pre op TEE 05/2014 with EF of 50%   Family history of cardiovascular disease    Family history of colon cancer  History of long-term treatment with high-risk medication    Hyperlipidemia    Hypertension    Insomnia    Ischemic cardiomyopathy    a. 03/2014 Echo: EF 25-30%, mid-apical/anteroseptal and inf AK, Gr 2 DD, ? apical thrombus, Mild MR.-->Discharged with LifeVest.   Malaise and fatigue    Myocardial infarction (HCC) 03/2014   Shingles 09/18/2019   Shortness of breath dyspnea    Sinus headache     Past Surgical History:  Procedure Laterality Date   BIOPSY  07/03/2020   Procedure: BIOPSY;  Surgeon: Lemar Lofty., MD;  Location: [facility];  Service: Gastroenterology;;   CARDIAC CATHETERIZATION  2001   showed no blockages    CATARACT EXTRACTION W/PHACO Left 01/04/2014   Procedure: CATARACT EXTRACTION PHACO AND INTRAOCULAR LENS PLACEMENT (IOC);  Surgeon: Gemma Payor, MD;  Location: [facility];  Service: Ophthalmology;  Laterality: Left;  CDE:5.65   CATARACT EXTRACTION W/PHACO Right 01/29/2014   Procedure: CATARACT EXTRACTION PHACO AND INTRAOCULAR LENS PLACEMENT RIGHT EYE CDE=4.49;  Surgeon: Gemma Payor, MD;  Location: [facility];  Service: Ophthalmology;  Laterality: Right;   CERVICAL SPINE SURGERY  09/27/2003   C6-67 servical fusion   COLONOSCOPY WITH PROPOFOL N/A 05/18/2016   Procedure: COLONOSCOPY WITH PROPOFOL;  Surgeon: Charolett Bumpers, MD;  Location: [facility];  Service: Endoscopy;  Laterality: N/A;   CORONARY ARTERY BYPASS GRAFT N/A 05/21/2014   Procedure: CORONARY ARTERY BYPASS GRAFTING (CABG);  Surgeon: Alleen Borne, MD;  Location: [facility] OR;  Service: Open Heart Surgery;  Laterality: N/A;   CYSTECTOMY  1998   fibroid cysts   DILATION AND CURETTAGE OF UTERUS  1976   2nd to miscarriage   ESOPHAGOGASTRODUODENOSCOPY (EGD) WITH PROPOFOL N/A 07/03/2020   Procedure: ESOPHAGOGASTRODUODENOSCOPY (EGD) WITH PROPOFOL;  Surgeon: Meridee Score Netty Starring., MD;  Location: [facility];  Service: Gastroenterology;  Laterality: N/A;   EUS N/A 07/03/2020   Procedure: UPPER ENDOSCOPIC ULTRASOUND (EUS) RADIAL;  Surgeon: Lemar Lofty., MD;  Location: [facility];  Service: Gastroenterology;  Laterality: N/A;   EYE SURGERY     FINE NEEDLE ASPIRATION  07/03/2020   Procedure: FINE NEEDLE ASPIRATION;  Surgeon: Meridee Score Netty Starring., MD;  Location: [facility];  Service: Gastroenterology;;   INTRAOPERATIVE TRANSESOPHAGEAL ECHOCARDIOGRAM N/A 05/21/2014   Procedure: INTRAOPERATIVE TRANSESOPHAGEAL ECHOCARDIOGRAM;  Surgeon: Alleen Borne, MD;  Location: [facility] OR;  Service: Open Heart Surgery;  Laterality: N/A;   LEFT HEART CATHETERIZATION WITH CORONARY ANGIOGRAM N/A 03/08/2014   Procedure: LEFT HEART CATHETERIZATION WITH CORONARY  ANGIOGRAM;  Surgeon: Brenton Joines M [place], MD;  Location: [facility] CATH LAB;  Service: Cardiovascular;  Laterality: N/A;   MASTOIDECTOMY  1990   and eardrum repair (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      aspirin EC 81 MG tablet Take 81 mg by mouth at bedtime.      Cholecalciferol (VITAMIN D3) 50 MCG (2000 UT) capsule Take 2,000 Units by mouth daily.     Cyanocobalamin (VITAMIN B12 PO) Take 3,000 mcg by mouth daily.     docusate sodium (COLACE) 100 MG capsule Take 100-200 mg by mouth  See admin instructions. Take 100 mg by mouth in the morning and 200 mg at bedtime     famotidine (PEPCID) 20 MG tablet TAKE 1 TABLET BY MOUTH TWICE A DAY 180 tablet 0   furosemide (LASIX) 20 MG tablet TAKE 1 TABLET BY MOUTH EVERY DAY - MAY TAKE 1 ADDITIONAL TABLET AS NEEDED 180 tablet 2   ezetimibe (ZETIA) 10 MG tablet TAKE 1 TABLET BY MOUTH EVERY DAY 90 tablet 3   ipratropium (ATROVENT) 0.06 % nasal spray Place 2 sprays into both nostrils daily as needed for rhinitis.     loratadine (CLARITIN) 10 MG tablet Take 10 mg by mouth daily as needed for allergies or rhinitis.     losartan (COZAAR) 50 MG tablet TAKE 1 TABLET BY MOUTH EVERY DAY 90 tablet 3   Magnesium Cl-Calcium Carbonate (SLOW-MAG PO) Take 1 tablet by mouth 2 (two) times daily.     Multiple Vitamins-Minerals (PRESERVISION/LUTEIN PO) Take by mouth.     mupirocin ointment (BACTROBAN) 2 % 3 (three) times daily as needed.     nitroGLYCERIN (NITROSTAT) 0.4 MG SL tablet Place 1 tablet (0.4 mg total) under the tongue every 5 (five) minutes as needed for  chest pain. 25 tablet 3   rosuvastatin (CRESTOR) 40 MG tablet Take 1 tablet (40 mg total) by mouth daily. (Patient taking differently: Take 40 mg by mouth at bedtime.) 90 tablet 3   tetrahydrozoline-zinc (VISINE-AC) 0.05-0.25 % ophthalmic solution Place 2 drops into both eyes daily as needed (for dry or irritated eyes).      trolamine salicylate (ASPERCREME/ALOE) 10 % cream Apply 1 application topically daily as needed (bursitis).     No facility-administered medications prior to visit.     Allergies:   Carvedilol, Spironolactone, Clopidogrel, Demerol [meperidine], Lipitor [atorvastatin], Lisinopril, Meperidine hcl, Sulfamethoxazole, Thimerosal (thiomersal), Dexilant [dexlansoprazole], Latex, Neomycin, Plavix [clopidogrel bisulfate], Sulfonamide derivatives, and Ticagrelor   Social History   Socioeconomic History   Marital status: Married    Spouse name: Not on file   Number of children: Not on file   Years of education: Not on file   Highest education level: Not on file  Occupational History   Not on file  Tobacco Use   Smoking status: Never   Smokeless tobacco: Never  Vaping Use   Vaping Use: Never used  Substance and Sexual Activity   Alcohol use: Not Currently    Comment: rare glass of wine   Drug use: No   Sexual activity: Not Currently    Birth control/protection: Post-menopausal  Other Topics Concern   Not on file  Social History Narrative   Not on file   Social Determinants of Health   Financial Resource Strain: Low Risk  (04/19/2020)   Overall Financial Resource Strain (CARDIA)    Difficulty of Paying Living Expenses: Not very hard  Food Insecurity: Not on file  Transportation Needs: Not on file  Physical Activity: Not on file  Stress: Not on file  Social Connections: Not on file     Family History:  The patient's family history includes Atrial fibrillation in her father; Cancer in her brother, brother, and father; Colon cancer in her father; Coronary artery  disease in her brother and brother; Hearing loss in her father; Heart attack in her mother; Heart disease in her mother; Hypertension in her brother, brother, brother, brother, and father; Pancreatic cancer in her brother; Peripheral vascular disease in her father; Stroke in her mother; Thyroid disease in her brother, father, and sister.  ROS:   Please see the history of present illness.    ROS All other systems reviewed and are negative.   PHYSICAL EXAM:   VS:  BP (!) 142/62   Pulse (!) 53   Ht 5' 5.5" (1.664 m)   Wt 155 lb 9.6 oz (70.6 kg)   SpO2 99%   BMI 25.50 kg/m    GEN: Well nourished, well developed, in no acute distress  HEENT: normal  Neck: no JVD, carotid bruits, or masses Cardiac: RRR; no murmurs, rubs, or gallops,no edema  Respiratory:  clear to auscultation bilaterally, normal work of breathing GI: soft, nontender, nondistended, + BS MS: no deformity or atrophy  Skin: warm and dry, no rash Neuro:  Alert and Oriented x 3, Strength and sensation are intact Psych: euthymic mood, full affect  Wt Readings from Last 3 Encounters:  11/02/22 155 lb 9.6 oz (70.6 kg)  03/24/22 148 lb (67.1 kg)  09/17/21 153 lb 3.2 oz (69.5 kg)      Studies/Labs Reviewed:   EKG:  EKG is  not ordered today.   Recent Labs: 12/22/2021: ALT 22   Lipid Panel    Component Value Date/Time   CHOL 117 12/22/2021 1034   TRIG 88 12/22/2021 1034   HDL 57 12/22/2021 1034   CHOLHDL 2.1 12/22/2021 1034   CHOLHDL 2.3 04/19/2020 1536   VLDL 25 07/14/2016 1211   LDLCALC 43 12/22/2021 1034   LDLCALC 69 04/19/2020 1536   Dated 10/12/22: CMET and CBC normal. Cholesterol 142, triglycerides 106, HDL 68, LDL 55.     Additional studies/ records that were reviewed today include:   Echo 08/30/2014 LV EF: 35% -   40% Study Conclusions  - Left ventricle: Poor acoustical windows with multiple wall motion   abnormalities. Recommend limited study with definity contrast   agent for more accurate  assessment. There is distal septal   akinesis. The cavity size was normal. Systolic function was   moderately reduced. The estimated ejection fraction was in the   range of 35% to 40%. There is akinesis of the apical myocardium.   There is akinesis of the apicalanterior myocardium. There is   akinesis of the midanteroseptal myocardium. There is akinesis of   the apicallateral myocardium. There was a reduced contribution of   atrial contraction to ventricular filling, due to increased   ventricular diastolic pressure or atrial contractile dysfunction.   Doppler parameters are consistent with a reversible restrictive   pattern, indicative of decreased left ventricular diastolic   compliance and/or increased left atrial pressure (grade 3   diastolic dysfunction). - Aortic valve: Trileaflet; normal thickness, mildly calcified   leaflets. - Mitral valve: There was mild regurgitation. - Left atrium: The atrium was mildly dilated. - Pulmonic valve: There was mild regurgitation.   Myoview 07/10/2015 Study Highlights   Nuclear stress EF: 49%. The left ventricular ejection fraction is mildly decreased (45-54%). There was no ST segment deviation noted during stress. Defect 1: There is a defect present in the mid anteroseptal, mid inferolateral, apical septal, apical lateral and apex location. Findings consistent with prior myocardial infarction. This is a low risk study.   1. Low risk study 2. Scar antero septal, apical and inferolateral. 3. Low nl EF   Myoview 05/23/19: Study Highlights    The left ventricular ejection fraction is mildly decreased (45-54%). Nuclear stress EF: 49%. No T wave inversion was noted during stress. There was no ST segment deviation noted during stress. Defect 1: There is a  large defect of severe severity present in the mid anterior, mid anteroseptal, mid inferolateral, apical anterior, apical septal, apical inferior, apical lateral and apex location. Findings  consistent with prior myocardial infarction. This is a low risk study.   Large size, severe severity fixed distal anteroseptal, anterior, apical, inferoapical and apical lateral perfusion defect suggestive of scar without significant reversible ischemia (SDS 1). LVEF 49% with distal anterior, anteroseptal and apical akinesis. This is an intermediate risk study. No change compared to prior study in 2017.     ASSESSMENT:    1. Coronary artery disease involving native coronary artery of native heart without angina pectoris   2. S/P CABG x 4   3. Essential hypertension   4. Pure hypercholesterolemia   5. Chronic systolic heart failure (HCC)      PLAN:  In order of problems listed above:  HTN. well controlled per patient report.  2.   CAD: Last Myoview Nov 2020- stable. No ischemia. EF 49%.  She has stable class 1 angina.  3.   Chronic systolic heart failure: appears well compensated. Last EF 49%. Volume status looks good.   4.   Hyperlipidemia: LDL improved to 55 on high dose Crestor and Zetia  5.   Ischemic cardiomyopathy: On losartan, unable to tolerate beta-blocker due to baseline bradycardia.  6. Pancreatic cyst - per GI. MRI stable.   7. Gastric ulcers. On Pepcid    Medication Adjustments/Labs and Tests Ordered: Current medicines are reviewed at length with the patient today.  Concerns regarding medicines are outlined above.  Medication changes, Labs and Tests ordered today are listed in the Patient Instructions below. There are no Patient Instructions on file for this visit.    Signed, Kyiesha Millward Sw[place], MD  11/02/2022 10:39 AM    [facility] [address] [place], [place], [place]  [address] Phone: [phone]; Fax: [phone]

## 2022-10-29 ENCOUNTER — Ambulatory Visit (HOSPITAL_COMMUNITY)
Admission: RE | Admit: 2022-10-29 | Discharge: 2022-10-29 | Disposition: A | Discharge status: Home / Self Care | Payer: PPO | Source: Ambulatory Visit | Attending: Adult Health Nurse Practitioner | Admitting: Adult Health Nurse Practitioner

## 2022-10-29 DIAGNOSIS — M85851 Other specified disorders of bone density and structure, right thigh: Secondary | ICD-10-CM | POA: Diagnosis not present

## 2022-10-29 DIAGNOSIS — Z13820 Encounter for screening for osteoporosis: Secondary | ICD-10-CM | POA: Insufficient documentation

## 2022-10-29 DIAGNOSIS — Z780 Asymptomatic menopausal state: Secondary | ICD-10-CM | POA: Diagnosis not present

## 2022-10-29 DIAGNOSIS — Z1231 Encounter for screening mammogram for malignant neoplasm of breast: Secondary | ICD-10-CM

## 2022-11-02 ENCOUNTER — Ambulatory Visit: Payer: PPO | Attending: Cardiology | Admitting: Cardiology

## 2022-11-02 ENCOUNTER — Encounter: Payer: Self-pay | Admitting: Cardiology

## 2022-11-02 ENCOUNTER — Other Ambulatory Visit: Payer: Self-pay | Admitting: Cardiology

## 2022-11-02 VITALS — BP 142/62 | HR 53 | Ht 65.5 in | Wt 155.6 lb

## 2022-11-02 DIAGNOSIS — I2510 Atherosclerotic heart disease of native coronary artery without angina pectoris: Secondary | ICD-10-CM

## 2022-11-02 DIAGNOSIS — I5022 Chronic systolic (congestive) heart failure: Secondary | ICD-10-CM | POA: Diagnosis not present

## 2022-11-02 DIAGNOSIS — Z951 Presence of aortocoronary bypass graft: Secondary | ICD-10-CM | POA: Diagnosis not present

## 2022-11-02 DIAGNOSIS — I10 Essential (primary) hypertension: Secondary | ICD-10-CM | POA: Diagnosis not present

## 2022-11-02 DIAGNOSIS — E7800 Pure hypercholesterolemia, unspecified: Secondary | ICD-10-CM

## 2022-11-02 NOTE — Patient Instructions (Signed)
Medication Instructions:  Continue same medications *If you need a refill on your cardiac medications before your next appointment, please call your pharmacy*   Lab Work: None ordered   Testing/Procedures: None ordered   Follow-Up: At [facility], you and your health needs are our priority.  As part of our continuing mission to provide you with exceptional heart care, we have created designated Provider Care Teams.  These Care Teams include your primary Cardiologist (physician) and Advanced Practice Providers (APPs -  Physician Assistants and Nurse Practitioners) who all work together to provide you with the care you need, when you need it.  We recommend signing up for the patient portal called "MyChart".  Sign up information is provided on this After Visit Summary.  MyChart is used to connect with patients for Virtual Visits (Telemedicine).  Patients are able to view lab/test results, encounter notes, upcoming appointments, etc.  Non-urgent messages can be sent to your provider as well.   To learn more about what you can do with MyChart, go to [email or web address].    Your next appointment:  6 months    Call Mid May to schedule Oct appointment     Provider:  Dr.Jordan

## 2023-01-28 ENCOUNTER — Other Ambulatory Visit: Payer: Self-pay | Admitting: Gastroenterology

## 2023-03-24 ENCOUNTER — Other Ambulatory Visit: Payer: Self-pay | Admitting: Cardiology

## 2023-04-05 DIAGNOSIS — M25559 Pain in unspecified hip: Secondary | ICD-10-CM | POA: Diagnosis not present

## 2023-04-05 DIAGNOSIS — M79641 Pain in right hand: Secondary | ICD-10-CM | POA: Diagnosis not present

## 2023-04-05 DIAGNOSIS — M79642 Pain in left hand: Secondary | ICD-10-CM | POA: Diagnosis not present

## 2023-04-05 DIAGNOSIS — R7309 Other abnormal glucose: Secondary | ICD-10-CM | POA: Diagnosis not present

## 2023-04-05 DIAGNOSIS — Z0000 Encounter for general adult medical examination without abnormal findings: Secondary | ICD-10-CM | POA: Diagnosis not present

## 2023-04-06 ENCOUNTER — Encounter: Payer: Self-pay | Admitting: Cardiology

## 2023-04-06 NOTE — Telephone Encounter (Signed)
Spoke to patient Dr.Jordan's advice given.Stated she starts cardiac rehab tomorrow.

## 2023-04-19 ENCOUNTER — Other Ambulatory Visit (HOSPITAL_COMMUNITY): Payer: Self-pay | Admitting: Adult Health Nurse Practitioner

## 2023-04-19 DIAGNOSIS — D490 Neoplasm of unspecified behavior of digestive system: Secondary | ICD-10-CM

## 2023-04-27 DIAGNOSIS — M25552 Pain in left hip: Secondary | ICD-10-CM | POA: Diagnosis not present

## 2023-04-30 NOTE — Progress Notes (Unsigned)
Cardiology Clinic Note   Patient Name: Kelli Roy Date of Encounter: 05/03/2023  Primary Care Provider:  Roe Rutherford, NP Primary Cardiologist:  Peter Swaziland, MD  Patient Profile    Kelli Roy 77 year old female presents to the clinic today for follow-up evaluation of her ischemic cardiomyopathy, chronic systolic CHF, and coronary artery disease.  Past Medical History    Past Medical History:  Diagnosis Date   Allergy    Alopecia    Arthritis    CAD (coronary artery disease)    a. 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 Laneta Simmers    Cataract    Chronic systolic CHF (congestive heart failure) (HCC)    a. 03/2014 Echo: EF 25-30%; Pre op TEE 05/2014 with EF of 50%   Family history of cardiovascular disease    Family history of colon cancer    History of long-term treatment with high-risk medication    Hyperlipidemia    Hypertension    Insomnia    Ischemic cardiomyopathy    a. 03/2014 Echo: EF 25-30%, mid-apical/anteroseptal and inf AK, Gr 2 DD, ? apical thrombus, Mild MR.-->Discharged with LifeVest.   Malaise and fatigue    Myocardial infarction (HCC) 03/2014   Shingles 09/18/2019   Shortness of breath dyspnea    Sinus headache    Past Surgical History:  Procedure Laterality Date   BIOPSY  07/03/2020   Procedure: BIOPSY;  Surgeon: Lemar Lofty., MD;  Location: [facility];  Service: Gastroenterology;;   CARDIAC CATHETERIZATION  2001   showed no blockages   CATARACT EXTRACTION W/PHACO Left 01/04/2014   Procedure: CATARACT EXTRACTION PHACO AND INTRAOCULAR LENS PLACEMENT (IOC);  Surgeon: Gemma Payor, MD;  Location: [facility];  Service: Ophthalmology;  Laterality: Left;  CDE:5.65   CATARACT EXTRACTION W/PHACO Right 01/29/2014   Procedure: CATARACT  EXTRACTION PHACO AND INTRAOCULAR LENS PLACEMENT RIGHT EYE CDE=4.49;  Surgeon: Gemma Payor, MD;  Location: [facility];  Service: Ophthalmology;  Laterality: Right;   CERVICAL SPINE SURGERY  09/27/2003   C6-67 servical fusion   COLONOSCOPY WITH PROPOFOL N/A 05/18/2016   Procedure: COLONOSCOPY WITH PROPOFOL;  Surgeon: Charolett Bumpers, MD;  Location: [facility];  Service: Endoscopy;  Laterality: N/A;   CORONARY ARTERY BYPASS GRAFT N/A 05/21/2014   Procedure: CORONARY ARTERY BYPASS GRAFTING (CABG);  Surgeon: Alleen Borne, MD;  Location[facility];  Service: Open Heart Surgery;  Laterality: N/A;   CYSTECTOMY  1998   fibroid cysts   DILATION AND CURETTAGE OF UTERUS  1976   2nd to miscarriage   ESOPHAGOGASTRODUODENOSCOPY (EGD) WITH PROPOFOL N/A 07/03/2020   Procedure: ESOPHAGOGASTRODUODENOSCOPY (EGD) WITH PROPOFOL;  Surgeon: Meridee Score Netty Starring., MD;  Location: [facility];  Service: Gastroenterology;  Laterality: N/A;   EUS N/A 07/03/2020   Procedure: UPPER ENDOSCOPIC ULTRASOUND (EUS) RADIAL;  Surgeon: Lemar Lofty., MD;  Location: [facility];  Service: Gastroenterology;  Laterality: N/A;   EYE SURGERY     FINE NEEDLE ASPIRATION  07/03/2020   Procedure: FINE NEEDLE ASPIRATION;  Surgeon: Meridee Score Netty Starring., MD;  Location: [facility];  Service: Gastroenterology;;   INTRAOPERATIVE TRANSESOPHAGEAL ECHOCARDIOGRAM N/A 05/21/2014   Procedure: INTRAOPERATIVE TRANSESOPHAGEAL ECHOCARDIOGRAM;  Surgeon: Alleen Borne, MD;  Location[facility];  Service: Open Heart Surgery;  Laterality: N/A;   LEFT HEART CATHETERIZATION WITH CORONARY ANGIOGRAM N/A 03/08/2014   Procedure: LEFT HEART CATHETERIZATION WITH CORONARY ANGIOGRAM;  Surgeon: Peter M Swaziland, MD;  Location: [facility];  Service: Cardiovascular;  Laterality: N/A;   MASTOIDECTOMY  1990   and eardrum repair (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)   PERCUTANEOUS CORONARY STENT INTERVENTION (PCI-S)  03/08/2014   Procedure: PERCUTANEOUS CORONARY STENT  INTERVENTION (PCI-S);  Surgeon: Peter M Swaziland, MD;  Location: [facility] CATH LAB;  Service: Cardiovascular;;  prox lad  bms    Allergies  Allergies  Allergen Reactions   Carvedilol Shortness Of Breath and Other (See Comments)    Also caused fluid to accumulate around her heart; resulted in hospitalization   Spironolactone Shortness Of Breath and Other (See Comments)    Also caused fluid to accumulate around her heart; resulted in hospitalization   Clopidogrel Rash   Demerol [Meperidine] Nausea Only   Lipitor [Atorvastatin] Itching   Lisinopril Cough   Meperidine Hcl Other (See Comments)   Sulfamethoxazole Other (See Comments)   Thimerosal (Thiomersal) Other (See Comments)   Dexilant [Dexlansoprazole] Rash   Latex Rash and Other (See Comments)   Neomycin Rash   Plavix [Clopidogrel Bisulfate] Rash   Sulfonamide Derivatives Rash   Ticagrelor Itching and Rash    Brilinta     History of Present Illness    Kelli Roy has a PMH of hyperlipidemia, HTN, STEMI, acute on chronic combined systolic and diastolic CHF EF 95-28%, ischemic cardiomyopathy, allergic rhinitis, pancreatic cyst, shingles, osteopenia, degenerative disc disease, CABG x 4, bradycardia, and history of bradycardia.  She had a rash reaction to Brilinta and Plavix.  Echocardiogram 2/16 showed an EF of 35-40% with akinesis of her anterior septum and apex.  She developed acute heart failure 3/16.  She was diuresed with IV Lasix and discharged home on p.o. Lasix and spironolactone.  She developed hypotension and required discontinuation of her spironolactone and carvedilol.  She had nuclear stress testing 1/17 which showed scar in the anterior septal, apical, and inferior lateral segment without ischemia.  Her EF was noted to be 49%.  She had anterior STEMI 9/14 and underwent emergent stenting to her LAD with bare-metal stent.  She was noted to have 50-70% left main disease, 70-80% ostial LAD disease, 50-70% ostial left circumflex  disease and EF of 25-30%.  Did not tolerate high-dose atorvastatin due to transaminitis.  Her atorvastatin was discontinued.  She was placed on rosuvastatin and has been tolerating it well.  She returned 11/15 and underwent CABG x 4 by Dr.  Laneta Simmers.  She continue to follow-up with cardiology.  She was last seen by Dr. Swaziland 11/02/2022.  At that time she remained stable from a cardiac standpoint.  Her blood pressure was well-controlled.  She completed cardiac rehab.  She reported low back problems and pain that radiated to her thigh.  Her pain was worse with standing for long periods of time.  She was not very physically active.  She had gained about 7 pounds.  She denied chest tightness and pain.  She presents to the clinic today for follow-up evaluation and states she was noticing that she was feeling weaker.  She has restarted cardiac rehab exercises after about a year.  She thinks she has gained about 5 pounds and is noticing that she is having more shortness of breath with increased physical activity.  She has normal breathing with normal activities.  We reviewed her open heart surgery and catheterization.  She expressed understanding.  She occasionally notices a brief heart flutter.  I will continue her current medication regimen.  I will refill her nitroglycerin, have her reduce the calories in her diet, have her maintain her physical activity and plan follow-up in 6 months.  Today she denies chest pain, shortness of breath, lower extremity edema, fatigue, palpitations, melena, hematuria, hemoptysis, diaphoresis, weakness, presyncope, syncope, orthopnea, and PND.    Home Medications    Prior to Admission medications   Medication Sig Start Date End Date Taking? Authorizing Provider  losartan (COZAAR) 50 MG tablet TAKE 1 TABLET BY MOUTH EVERY DAY 11/02/22   Lewayne Bunting, MD  Acetaminophen 500 MG capsule Take 500 mg by mouth every 6 (six) hours as needed for moderate pain or headache.      [provider]  aspirin EC 81 MG tablet Take 81 mg by mouth at bedtime.     [provider]  Cholecalciferol (VITAMIN D3) 50 MCG (2000 UT) capsule Take 2,000 Units by mouth daily.    [provider]  Cyanocobalamin (VITAMIN B12 PO) Take 3,000 mcg by mouth daily.    [provider]  docusate sodium (COLACE) 100 MG capsule Take 100-200 mg by mouth See admin instructions. Take 100 mg by mouth in the morning and 200 mg at bedtime    [provider]  ezetimibe (ZETIA) 10 MG tablet TAKE 1 TABLET BY MOUTH EVERY DAY 08/31/22   Swaziland, Peter M, MD  famotidine (PEPCID) 20 MG tablet TAKE 1 TABLET BY MOUTH TWICE A DAY 10/08/22   Mansouraty, Netty Starring., MD  furosemide (LASIX) 20 MG tablet TAKE 1 TABLET BY MOUTH EVERY DAY - MAY TAKE 1 ADDITIONAL TABLET AS NEEDED 03/24/23   Swaziland, Peter M, MD  ipratropium (ATROVENT) 0.06 % nasal spray Place 2 sprays into both nostrils daily as needed for rhinitis.    [provider]  loratadine (CLARITIN) 10 MG tablet Take 10 mg by mouth daily as needed for allergies or rhinitis.    [provider]  Magnesium Cl-Calcium Carbonate (SLOW-MAG PO) Take 1 tablet by mouth 2 (two) times daily.    [provider]  Multiple Vitamins-Minerals (PRESERVISION/LUTEIN PO) Take by mouth.    [provider]  mupirocin ointment (BACTROBAN) 2 % 3 (three) times daily as needed. 08/27/20   [provider]  nitroGLYCERIN (NITROSTAT) 0.4 MG SL tablet Place 1 tablet (0.4 mg total) under the tongue every 5 (five) minutes as needed for chest pain. 10/03/21   Swaziland, Peter M, MD  rosuvastatin (CRESTOR) 40 MG tablet Take 1 tablet (40 mg total) by mouth daily. Patient taking differently: Take 40 mg by mouth at bedtime. 05/07/20 03/24/22  Salley Scarlet, MD  tetrahydrozoline-zinc (VISINE-AC) 0.05-0.25 % ophthalmic solution Place 2 drops into both eyes daily as needed (for dry or irritated eyes).     [provider]   trolamine salicylate (ASPERCREME/ALOE) 10 % cream Apply 1 application topically daily as needed (bursitis).    [provider]    Family History    Family History  Problem Relation Age of Onset   Stroke Mother    Heart attack Mother    Heart disease Mother    Peripheral vascular disease Father        had stent in leg   Colon cancer Father    Atrial fibrillation Father        with pacemaker   Thyroid disease Father    Cancer Father  Hearing loss Father    Hypertension Father    Pancreatic cancer Brother    Cancer Brother    Hypertension Brother    Coronary artery disease Brother        with Stent   Cancer Brother    Hypertension Brother    Coronary artery disease Brother        with CABG in his 18's   Hypertension Brother    Thyroid disease Brother    Hypertension Brother    Thyroid disease Sister    Esophageal cancer Neg Hx    Inflammatory bowel disease Neg Hx    Liver disease Neg Hx    Stomach cancer Neg Hx    She indicated that her mother is deceased. She indicated that her father is deceased. She indicated that her sister is alive. She indicated that two of her four brothers are alive. She indicated that her maternal grandmother is deceased. She indicated that her maternal grandfather is deceased. She indicated that her paternal grandmother is deceased. She indicated that her paternal grandfather is deceased. She indicated that the status of her neg hx is unknown.  Social History    Social History   Socioeconomic History   Marital status: Married    Spouse name: Not on file   Number of children: Not on file   Years of education: Not on file   Highest education level: Not on file  Occupational History   Not on file  Tobacco Use   Smoking status: Never   Smokeless tobacco: Never  Vaping Use   Vaping status: Never Used  Substance and Sexual Activity   Alcohol use: Not Currently    Comment: rare glass of wine   Drug use: No   Sexual activity:  Not Currently    Birth control/protection: Post-menopausal  Other Topics Concern   Not on file  Social History Narrative   Not on file   Social Determinants of Health   Financial Resource Strain: Low Risk  (04/02/2023)   Received [facility]   Overall Financial Resource Strain (CARDIA)    Difficulty of Paying Living Expenses: Not very hard  Food Insecurity: No Food Insecurity (04/02/2023)   [facility]   Hunger Vital Sign    Worried About Running Out of Food in the Last Year: Never true    Ran Out of Food in the Last Year: Never true  Transportation Needs: No Transportation Needs (04/02/2023)   Received from [facility] - Transportation    Lack of Transportation (Medical): No    Lack of Transportation (Non-Medical): No  Physical Activity: Insufficiently Active (04/02/2023)   Received [facility]   Exercise Vital Sign    Days of Exercise per Week: 2 days    Minutes of Exercise per Session: 30 min  Stress: No Stress Concern Present (04/02/2023)   Received from [facility] - Occupational Stress Questionnaire    Feeling of Stress : Only a little  Social Connections: Moderately Integrated (04/02/2023)   Received from [facility]   Social Network    How would you rate your social network (family, work, friends)?: Adequate participation with social networks  Intimate Partner Violence: Not At Risk (04/02/2023)   Received from [facility]   HITS    Over the last 12 months how often did your partner physically hurt you?: 1    Over the last 12 months how often did your partner  insult you or talk down to you?: 1    Over the last 12 months how often did your partner threaten you with physical harm?: 1    Over the last 12 months how often did your partner scream or curse at you?: 1     Review of Systems    General:  No chills, fever, night sweats or weight changes.  Cardiovascular:  No chest pain,  dyspnea on exertion, edema, orthopnea, palpitations, paroxysmal nocturnal dyspnea. Dermatological: No rash, lesions/masses Respiratory: No cough, dyspnea Urologic: No hematuria, dysuria Abdominal:   No nausea, vomiting, diarrhea, bright red blood per rectum, melena, or hematemesis Neurologic:  No visual changes, wkns, changes in mental status. All other systems reviewed and are otherwise negative except as noted above.  Physical Exam    VS:  BP 136/74 (BP Location: Left Arm, Patient Position: Sitting, Cuff Size: Normal)   Pulse 61   Ht 5\' 5"  (1.651 m)   Wt 155 lb 3.2 oz (70.4 kg)   SpO2 93%   BMI 25.83 kg/m  , BMI Body mass index is 25.83 kg/m. GEN: Well nourished, well developed, in no acute distress. HEENT: normal. Neck: Supple, no JVD, carotid bruits, or masses. Cardiac: RRR, no murmurs, rubs, or gallops. No clubbing, cyanosis, edema.  Radials/DP/PT 2+ and equal bilaterally.  Respiratory:  Respirations regular and unlabored, clear to auscultation bilaterally. GI: Soft, nontender, nondistended, BS + x 4. MS: no deformity or atrophy. Skin: warm and dry, no rash. Neuro:  Strength and sensation are intact. Psych: Normal affect.  Accessory Clinical Findings    Recent Labs: No results found for requested labs within last 365 days.   Recent Lipid Panel    Component Value Date/Time   CHOL 117 12/22/2021 1034   TRIG 88 12/22/2021 1034   HDL 57 12/22/2021 1034   CHOLHDL 2.1 12/22/2021 1034   CHOLHDL 2.3 04/19/2020 1536   VLDL 25 07/14/2016 1211   LDLCALC 43 12/22/2021 1034   LDLCALC 69 04/19/2020 1536         ECG personally reviewed by me today- EKG Interpretation Date/Time:  Monday May 03 2023 11:37:24 EDT Ventricular Rate:  61 PR Interval:  166 QRS Duration:  82 QT Interval:  392 QTC Calculation: 394 R Axis:   57  Text Interpretation: Normal sinus rhythm Low voltage QRS Confirmed by Edd Fabian [phone]) on 05/03/2023 12:08:23 PM      Echocardiogram  08/30/2014  LV EF: 35% -   40%   -------------------------------------------------------------------  Indications:     CAD (I25.10).   -------------------------------------------------------------------  History:  PMH:  Ischemic Cardiomyopathy, STEMI.  Dyspnea.  Coronary artery disease.  Congestive heart failure.  PMH:  Myocardial infarction.  Risk factors:  Family history of coronary  artery disease. Hypertension. Dyslipidemia.   -------------------------------------------------------------------  Study Conclusions   - Left ventricle: Poor acoustical windows with multiple wall motion    abnormalities. Recommend limited study with definity contrast    agent for more accurate assessment. There is distal septal    akinesis. The cavity size was normal. Systolic function was    moderately reduced. The estimated ejection fraction was in the    range of 35% to 40%. There is akinesis of the apical myocardium.    There is akinesis of the apicalanterior myocardium. There is    akinesis of the midanteroseptal myocardium. There is akinesis of    the apicallateral myocardium. There was a reduced contribution of    atrial contraction to ventricular filling, due to  increased    ventricular diastolic pressure or atrial contractile dysfunction.    Doppler parameters are consistent with a reversible restrictive    pattern, indicative of decreased left ventricular diastolic    compliance and/or increased left atrial pressure (grade 3    diastolic dysfunction).  - Aortic valve: Trileaflet; normal thickness, mildly calcified    leaflets.  - Mitral valve: There was mild regurgitation.  - Left atrium: The atrium was mildly dilated.  - Pulmonic valve: There was mild regurgitation.   -------------------------------------------------------------------  Labs, prior tests, procedures, and surgery:  Coronary artery bypass grafting.   Transthoracic echocardiography.  M-mode, complete 2D, spectral   Doppler, and color Doppler.  Birthdate:  Patient birthdate:  10-Apr-1946.  Age:  Patient is 77 yr old.  Sex:  Gender: female.  BMI: 24.1 kg/m^2.  Blood pressure:     159/83  Patient status:  Outpatient.  Study date:  Study date: 08/30/2014. Study time: 10:27  AM.  Location:  Echo laboratory.   -------------------------------------------------------------------   -------------------------------------------------------------------  Left ventricle:  Poor acoustical windows with multiple wall motion  abnormalities. Recommend limited study with definity contrast agent  for more accurate assessment. There is distal septal akinesis. The  cavity size was normal. Systolic function was moderately reduced.  The estimated ejection fraction was in the range of 35% to 40%.  Regional wall motion abnormalities:   There is akinesis of the  apical myocardium.  There is akinesis of the apicalanterior  myocardium.  There is akinesis of the midanteroseptal myocardium.  There is akinesis of the apicallateral myocardium. There was a  reduced contribution of atrial contraction to ventricular filling,  due to increased ventricular diastolic pressure or atrial  contractile dysfunction. Doppler parameters are consistent with a  reversible restrictive pattern, indicative of decreased left  ventricular diastolic compliance and/or increased left atrial  pressure (grade 3 diastolic dysfunction).   -------------------------------------------------------------------  Aortic valve:   Trileaflet; normal thickness, mildly calcified  leaflets. Mobility was not restricted.  Doppler:  Transvalvular  velocity was within the normal range. There was no stenosis. There  was no regurgitation.   -------------------------------------------------------------------  Aorta: Aortic root: The aortic root was normal in size.   -------------------------------------------------------------------  Mitral valve:   Structurally normal  valve.   Mobility was not  restricted.  Doppler:  Transvalvular velocity was within the normal  range. There was no evidence for stenosis. There was mild  regurgitation.   -------------------------------------------------------------------  Left atrium:  LA Volume/BSA= 31.8 ml/m2. The atrium was mildly  dilated.   -------------------------------------------------------------------  Right ventricle:  The cavity size was normal. Wall thickness was  normal. Systolic function was normal.   -------------------------------------------------------------------  Pulmonic valve:    Structurally normal valve.   Cusp separation was  normal.  Doppler:  Transvalvular velocity was within the normal  range. There was no evidence for stenosis. There was mild  regurgitation.   -------------------------------------------------------------------  Tricuspid valve:   Structurally normal valve.    Doppler:  Transvalvular velocity was within the normal range. There was mild  regurgitation.   -------------------------------------------------------------------  Pulmonary artery:   The main pulmonary artery was normal-sized.  Systolic pressure was within the normal range.   -------------------------------------------------------------------  Right atrium:  The atrium was normal in size.   -------------------------------------------------------------------  Pericardium: There was no pericardial effusion.   -------------------------------------------------------------------  Systemic veins:  Inferior vena cava: The vessel was normal in size. The  respirophasic diameter changes were in the normal range (= 50%),  consistent with normal  central venous pressure. Diameter: 18.7 mm.    Stress testing 05/23/2019  The left ventricular ejection fraction is mildly decreased (45-54%). Nuclear stress EF: 49%. No T wave inversion was noted during stress. There was no ST segment deviation noted during  stress. Defect 1: There is a large defect of severe severity present in the mid anterior, mid anteroseptal, mid inferolateral, apical anterior, apical septal, apical inferior, apical lateral and apex location. Findings consistent with prior myocardial infarction. This is a low risk study.   Large size, severe severity fixed distal anteroseptal, anterior, apical, inferoapical and apical lateral perfusion defect suggestive of scar without significant reversible ischemia (SDS 1). LVEF 49% with distal anterior, anteroseptal and apical akinesis. This is an intermediate risk study. No change compared to prior study in 2017.   Assessment & Plan   1.  Coronary artery disease-no chest pain today.  Status post CABG x 4.  Stress testing 05/23/2019 showed no ischemia, prior MI and low risk.  Details above. Heart healthy low-sodium diet Maintain physical activity Continue current medical therapy  Essential hypertension-BP today 136/74. Maintain blood pressure log Continue losartan Ordered BMP  Hyperlipidemia-LDL 43 on 12/22/21. High-fiber diet Increase physical activity as tolerated Continue rosuvastatin Repeat fasting lipids and LFTs  Chronic systolic CHF-euvolemic.  Weight stable.  Echocardiogram 08/30/2014 showed EF 35-40%.  Stress testing 05/23/2019 showed EF 49%. Maintain weight log Continue current medical therapy Increase physical activity as tolerated  Disposition: Follow-up with Dr. Swaziland or me in 6-9 months.   Thomasene Ripple. Anel Creighton NP-C     05/03/2023, 12:07 PM [facility] [address] Office [phone] Fax [phone]    I spent 14 minutes examining this patient, reviewing medications, and using patient centered shared decision making involving her cardiac care.   I spent greater than 20 minutes reviewing her past medical history,  medications, and prior cardiac tests.

## 2023-05-01 ENCOUNTER — Other Ambulatory Visit: Payer: Self-pay | Admitting: Gastroenterology

## 2023-05-03 ENCOUNTER — Ambulatory Visit: Payer: PPO | Attending: General Practice | Admitting: General Practice

## 2023-05-03 ENCOUNTER — Encounter: Payer: Self-pay | Admitting: General Practice

## 2023-05-03 VITALS — BP 136/74 | HR 61 | Ht 65.0 in | Wt 155.2 lb

## 2023-05-03 DIAGNOSIS — Z951 Presence of aortocoronary bypass graft: Secondary | ICD-10-CM | POA: Diagnosis not present

## 2023-05-03 DIAGNOSIS — E7800 Pure hypercholesterolemia, unspecified: Secondary | ICD-10-CM

## 2023-05-03 DIAGNOSIS — I10 Essential (primary) hypertension: Secondary | ICD-10-CM | POA: Diagnosis not present

## 2023-05-03 DIAGNOSIS — I5022 Chronic systolic (congestive) heart failure: Secondary | ICD-10-CM

## 2023-05-03 DIAGNOSIS — I2510 Atherosclerotic heart disease of native coronary artery without angina pectoris: Secondary | ICD-10-CM

## 2023-05-03 MED ORDER — NITROGLYCERIN 0.4 MG SL SUBL: 0.4000 mg | SUBLINGUAL_TABLET | SUBLINGUAL | 1 refills | Status: AC | PRN

## 2023-05-03 NOTE — Patient Instructions (Addendum)
Medication Instructions:  The current medical regimen is effective;  continue present plan and medications as directed. Please refer to the Current Medication list given to you today.  *If you need a refill on your cardiac medications before your next appointment, please call your pharmacy*  Lab Work: FASTING BMET AND LIPID If you have labs (blood work) drawn today and your tests are completely normal, you will receive your results only by:    MyChart Message (if you have MyChart) OR   A paper copy in the mail If you have any lab test that is abnormal or we need to change your treatment, we will call you to review the results.  Other Instructions MAINTAIN PHYSICAL ACTIVITY-AS TOLERATED DECREASE THE CALORIES IN YOUR DIET  Follow-Up: At [facility] you and your health needs are our priority.  As part of our continuing mission to provide you with exceptional heart care, we have created designated Provider Care Teams.  These Care Teams include your primary Cardiologist (physician) and Advanced Practice Providers (APPs -  Physician Assistants and Nurse Practitioners) who all work together to provide you with the care you need, when you need it.  Your next appointment:   6 month(s)  Provider:   Peter Swaziland, MD  or Edd Fabian, FNP

## 2023-05-12 DIAGNOSIS — L578 Other skin changes due to chronic exposure to nonionizing radiation: Secondary | ICD-10-CM | POA: Diagnosis not present

## 2023-05-12 DIAGNOSIS — L821 Other seborrheic keratosis: Secondary | ICD-10-CM | POA: Diagnosis not present

## 2023-05-12 DIAGNOSIS — Z86018 Personal history of other benign neoplasm: Secondary | ICD-10-CM | POA: Diagnosis not present

## 2023-05-12 DIAGNOSIS — Z85828 Personal history of other malignant neoplasm of skin: Secondary | ICD-10-CM | POA: Diagnosis not present

## 2023-05-12 DIAGNOSIS — D225 Melanocytic nevi of trunk: Secondary | ICD-10-CM | POA: Diagnosis not present

## 2023-05-14 DIAGNOSIS — E7800 Pure hypercholesterolemia, unspecified: Secondary | ICD-10-CM | POA: Diagnosis not present

## 2023-05-14 DIAGNOSIS — I5022 Chronic systolic (congestive) heart failure: Secondary | ICD-10-CM | POA: Diagnosis not present

## 2023-05-14 DIAGNOSIS — I2510 Atherosclerotic heart disease of native coronary artery without angina pectoris: Secondary | ICD-10-CM | POA: Diagnosis not present

## 2023-05-14 DIAGNOSIS — Z951 Presence of aortocoronary bypass graft: Secondary | ICD-10-CM | POA: Diagnosis not present

## 2023-05-14 DIAGNOSIS — I10 Essential (primary) hypertension: Secondary | ICD-10-CM | POA: Diagnosis not present

## 2023-05-15 LAB — LIPID PANEL
Chol/HDL Ratio: 2 ratio (ref 0.0–4.4)
Cholesterol, Total: 123 mg/dL (ref 100–199)
HDL: 63 mg/dL (ref >39)
LDL Chol Calc (NIH): 47 mg/dL (ref 0–99)
Triglycerides: 59 mg/dL (ref 0–149)
VLDL Cholesterol Cal: 13 mg/dL (ref 5–40)

## 2023-05-15 LAB — BASIC METABOLIC PANEL
BUN/Creatinine Ratio: 17 (ref 12–28)
BUN: 16 mg/dL (ref 8–27)
CO2: 24 mmol/L (ref 20–29)
Calcium: 9.7 mg/dL (ref 8.7–10.3)
Chloride: 105 mmol/L (ref 96–106)
Creatinine, Ser: 0.93 mg/dL (ref 0.57–1.00)
Glucose: 96 mg/dL (ref 70–99)
Potassium: 4.6 mmol/L (ref 3.5–5.2)
Sodium: 142 mmol/L (ref 134–144)
eGFR: 63 mL/min/{1.73_m2} (ref >59)

## 2023-05-17 ENCOUNTER — Other Ambulatory Visit (HOSPITAL_COMMUNITY): Payer: Self-pay | Admitting: Adult Health Nurse Practitioner

## 2023-05-17 ENCOUNTER — Ambulatory Visit (HOSPITAL_COMMUNITY)
Admission: RE | Admit: 2023-05-17 | Discharge: 2023-05-17 | Disposition: A | Discharge status: Home / Self Care | Payer: PPO | Source: Ambulatory Visit | Attending: Adult Health Nurse Practitioner | Admitting: Adult Health Nurse Practitioner

## 2023-05-17 DIAGNOSIS — D490 Neoplasm of unspecified behavior of digestive system: Secondary | ICD-10-CM | POA: Insufficient documentation

## 2023-05-17 DIAGNOSIS — R109 Unspecified abdominal pain: Secondary | ICD-10-CM | POA: Diagnosis not present

## 2023-05-17 DIAGNOSIS — I700 Atherosclerosis of aorta: Secondary | ICD-10-CM | POA: Diagnosis not present

## 2023-05-17 MED ORDER — GADOBUTROL 1 MMOL/ML IV SOLN
7.0000 mL | Freq: Once | INTRAVENOUS | Status: AC | PRN
  Administered 2023-05-17: 7 mL via INTRAVENOUS

## 2023-06-14 DIAGNOSIS — Z1231 Encounter for screening mammogram for malignant neoplasm of breast: Secondary | ICD-10-CM | POA: Diagnosis not present

## 2023-07-03 ENCOUNTER — Other Ambulatory Visit: Payer: Self-pay | Admitting: Cardiology

## 2023-07-19 ENCOUNTER — Telehealth: Payer: Self-pay | Admitting: Cardiology

## 2023-07-19 NOTE — Telephone Encounter (Signed)
 Patient said that she has a bladder infection and they prescribed her the medication Macrobid. Want to know if it is ok to take

## 2023-07-19 NOTE — Telephone Encounter (Signed)
 Called and spoke to patient. Verified name and DOB. Below message relayed to patient per Laural Golden the pharmacist. Patient verbalized understanding and agree.  Pavero, Kelli Roy, RPH Yes, ok to taKE

## 2023-09-30 ENCOUNTER — Other Ambulatory Visit: Payer: Self-pay | Admitting: Cardiology

## 2023-11-13 NOTE — Progress Notes (Unsigned)
 Cardiology Office Note    Date:  11/17/2023   ID:  Kelli Roy, DOB 1946/03/09, MRN [id number]  PCP:  Lorre Rosin, NP  Cardiologist:  Dr. Swaziland  Chief Complaint  Patient presents with   Coronary Artery Disease    History of Present Illness:  Kelli Roy is a 78 y.o. female is seen for follow up.  She has a PMH of CAD, chronic systolic heart failure, hypertension, hyperlipidemia, and history of ischemic cardiomyopathy.  Patient had anterior STEMI in September 2015 and underwent emergent stenting of the proximal LAD with a BMS.  She also had 50 to 70% left main disease, 70 to 80% ostial LAD disease, 50 to 70% ostial left circumflex disease, EF was 25 to 30%.  She was placed on high-dose Lipitor  therapy but noted to have marked elevation of her transaminases and her statin was discontinued.  Since then, she has been tolerating Crestor .  She eventually returned in November 2015 for CABG by Dr. Sherene Dilling was LIMA to LAD, SVG to diagonal, SVG to OM, SVG to RCA.  She has a history of a rash reaction to both Brilinta  and Plavix .  Echocardiogram in February 2016 showed EF 35 to 40% with akinesis to the anteroseptum and apex.  She developed acute heart failure in March 2016.  She was diuresed with IV Lasix  and it was discharged home with addition of Lasix  and Aldactone .  She later developed hypotension requiring the discontinuation of Coreg  and Aldactone .  She had a Myoview  in January 2017 that showed scar in the anteroseptal, apical and inferolateral segment without ischemia, EF 49%.  She was seen on 12/02/2017, she was having a very atypical chest discomfort.  She sought medical attention in [facility]  emergency room after having persistent chest soreness for several hours.  Troponin was negative at the time.  EKG showed no significant changes.  Given the atypical nature of her symptoms no further work up recommended.  She was seen in the ED on 05/06/19 with chest pain and tightness.  Ecg showed no acute change and HS troponin negative x 2.  Myoview  was done and showed an area of scar. No ischemia. Unchanged from 2017. EF 49%.   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 EGD done showing gastric inflammation and non bleeding gastric ulcers. Pancreatic cyst biopsied. Bx was apparently benign. Was placed on Pepcid .   On follow up today she is doing OK. Notes her older brother just had bypass. Hasn't been monitoring BP as much. Notes symptoms of chest tightness at times and feels she holds her breath. Not exercising as much.   Past Medical History:  Diagnosis Date   Allergy     Alopecia    Arthritis    CAD (coronary artery disease)    a. 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 Sherene Dilling    Cataract    Chronic systolic CHF (congestive heart failure) (HCC)    a. 03/2014 Echo: EF 25-30%; Pre op TEE 05/2014 with EF of 50%   Family history of cardiovascular disease    Family history of colon cancer    History of long-term treatment with high-risk medication    Hyperlipidemia    Hypertension  Insomnia    Ischemic cardiomyopathy    a. 03/2014 Echo: EF 25-30%, mid-apical/anteroseptal and inf AK, Gr 2 DD, ? apical thrombus, Mild MR.-->Discharged with LifeVest.   Malaise and fatigue    Myocardial infarction (HCC) 03/2014   Shingles 09/18/2019   Shortness of breath dyspnea    Sinus headache     Past Surgical History:  Procedure Laterality Date   BIOPSY  07/03/2020   Procedure: BIOPSY;  Surgeon: Normie Becton., MD;  Location: [facility];  Service: Gastroenterology;;   CARDIAC CATHETERIZATION  2001   showed no blockages   CATARACT EXTRACTION W/PHACO Left 01/04/2014   Procedure: CATARACT EXTRACTION PHACO AND INTRAOCULAR LENS PLACEMENT  (IOC);  Surgeon: Anner Kill, MD;  Location: [facility];  Service: Ophthalmology;  Laterality: Left;  CDE:5.65   CATARACT EXTRACTION W/PHACO Right 01/29/2014   Procedure: CATARACT EXTRACTION PHACO AND INTRAOCULAR LENS PLACEMENT RIGHT EYE CDE=4.49;  Surgeon: Anner Kill, MD;  Location: [facility];  Service: Ophthalmology;  Laterality: Right;   CERVICAL SPINE SURGERY  09/27/2003   C6-67 servical fusion   COLONOSCOPY WITH PROPOFOL  N/A 05/18/2016   Procedure: COLONOSCOPY WITH PROPOFOL ;  Surgeon: Garrett Kallman, MD;  Location: [facility];  Service: Endoscopy;  Laterality: N/A;   CORONARY ARTERY BYPASS GRAFT N/A 05/21/2014   Procedure: CORONARY ARTERY BYPASS GRAFTING (CABG);  Surgeon: Bartley Lightning, MD;  Location[facility];  Service: Open Heart Surgery;  Laterality: N/A;   CYSTECTOMY  1998   fibroid cysts   DILATION AND CURETTAGE OF UTERUS  1976   2nd to miscarriage   ESOPHAGOGASTRODUODENOSCOPY (EGD) WITH PROPOFOL  N/A 07/03/2020   Procedure: ESOPHAGOGASTRODUODENOSCOPY (EGD) WITH PROPOFOL ;  Surgeon: Brice Campi Albino Alu., MD;  Location: [facility];  Service: Gastroenterology;  Laterality: N/A;   EUS N/A 07/03/2020   Procedure: UPPER ENDOSCOPIC ULTRASOUND (EUS) RADIAL;  Surgeon: Normie Becton., MD;  Location: [facility];  Service: Gastroenterology;  Laterality: N/A;   EYE SURGERY     FINE NEEDLE ASPIRATION  07/03/2020   Procedure: FINE NEEDLE ASPIRATION;  Surgeon: Brice Campi Albino Alu., MD;  Location: [facility];  Service: Gastroenterology;;   INTRAOPERATIVE TRANSESOPHAGEAL ECHOCARDIOGRAM N/A 05/21/2014   Procedure: INTRAOPERATIVE TRANSESOPHAGEAL ECHOCARDIOGRAM;  Surgeon: Bartley Lightning, MD;  Location: [facility] OR;  Service: Open Heart Surgery;  Laterality: N/A;   LEFT HEART CATHETERIZATION WITH CORONARY ANGIOGRAM N/A 03/08/2014   Procedure: LEFT HEART CATHETERIZATION WITH CORONARY ANGIOGRAM;  Surgeon: Nachman Sundt M Swaziland, MD;  Location: [facility] CATH LAB;  Service: Cardiovascular;  Laterality: N/A;    MASTOIDECTOMY  1990   and eardrum repair (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      aspirin  EC 81 MG tablet Take 81 mg by mouth at bedtime.      Cholecalciferol (VITAMIN D3) 50 MCG (2000 UT) capsule Take 2,000 Units by mouth daily.     Cyanocobalamin (VITAMIN B12 PO) Take 3,000 mcg by mouth daily.     docusate sodium  (COLACE) 100 MG capsule Take 100-200 mg by mouth See admin instructions. Take 100 mg by mouth in the morning and 200 mg at bedtime  ezetimibe  (ZETIA ) 10 MG tablet TAKE 1 TABLET BY MOUTH EVERY DAY 90 tablet 3   famotidine  (PEPCID ) 20 MG tablet TAKE 1 TABLET BY MOUTH TWICE A DAY 180 tablet 0   furosemide  (LASIX ) 20 MG tablet TAKE 1 TABLET BY MOUTH EVERY DAY - MAY TAKE 1 ADDITIONAL TABLET AS NEEDED 135 tablet 2   ipratropium (ATROVENT) 0.06 % nasal spray Place 2 sprays into both nostrils daily as needed for rhinitis.     loratadine (CLARITIN) 10 MG tablet Take 10 mg by mouth daily as needed for allergies or rhinitis.     losartan  (COZAAR ) 50 MG tablet TAKE 1 TABLET BY MOUTH EVERY DAY 90 tablet 2   Magnesium  Cl-Calcium  Carbonate (SLOW-MAG PO) Take 1 tablet by mouth 2 (two) times daily.     mupirocin  ointment (BACTROBAN ) 2 % 3 (three) times daily as needed.     nitroGLYCERIN  (NITROSTAT ) 0.4 MG SL tablet Place 1 tablet (0.4 mg total) under the tongue every 5 (five) minutes as needed for chest pain. 25 tablet 1   tetrahydrozoline-zinc (VISINE-AC) 0.05-0.25 % ophthalmic solution Place 2 drops into both eyes daily as needed (for dry or irritated eyes).      trolamine  salicylate (ASPERCREME/ALOE) 10 % cream Apply 1 application topically daily as needed (bursitis).     rosuvastatin  (CRESTOR ) 40 MG tablet Take 1 tablet (40 mg total) by mouth daily. (Patient taking differently: Take 40 mg by mouth at bedtime.) 90 tablet 3   No facility-administered medications prior to visit.     Allergies:   Carvedilol , Spironolactone , Clopidogrel , Demerol [meperidine], Lipitor  [atorvastatin ], Lisinopril , Meperidine hcl, Sulfamethoxazole, Thimerosal (thiomersal), Dexilant [dexlansoprazole], Latex, Neomycin , Plavix  [clopidogrel  bisulfate], Sulfonamide derivatives, and Ticagrelor    Social History   Socioeconomic History   Marital status: Married    Spouse name: Not on file   Number of children: Not on file   Years of education: Not on file   Highest education level: Not on file  Occupational History   Not on file  Tobacco Use   Smoking status: Never   Smokeless tobacco: Never  Vaping Use   Vaping status: Never Used  Substance and Sexual Activity   Alcohol use: Not Currently    Comment: rare glass of wine   Drug use: No   Sexual activity: Not Currently    Birth control/protection: Post-menopausal  Other Topics Concern   Not on file  Social History Narrative   Not on file   Social Drivers of Health   Financial Resource Strain: Low Risk  (09/20/2023)   Received from [facility]   Overall Financial Resource Strain (CARDIA)    Difficulty of Paying Living Expenses: Not very hard  Food Insecurity: No Food Insecurity (09/20/2023)   Received from [facility]   Hunger Vital Sign    Worried About Running Out of Food in the Last Year: Never true    Ran Out of Food in the Last Year: Never true  Transportation Needs: No Transportation Needs (09/20/2023)   Received from [facility] - Transportation    Lack of Transportation (Medical): No    Lack of Transportation (Non-Medical): No  Physical Activity: Insufficiently Active (04/02/2023)   Received from  [facility]   Exercise Vital Sign    Days of Exercise per Week: 2 days    Minutes of Exercise per Session: 30 min  Stress: No Stress Concern Present (04/02/2023)   Received from [facility] - Occupational Stress  Questionnaire    Feeling of Stress : Only a little  Social Connections: Moderately Integrated (04/02/2023)   Received from [facility]   Social Network    How would you rate your social network (family, work, friends)?: Adequate participation with social networks     Family History:  The patient's family history includes Atrial fibrillation in her father; Cancer in her brother, brother, and father; Colon cancer in her father; Coronary artery disease in her brother and brother; Hearing loss in her father; Heart attack in her mother; Heart disease in her mother; Hypertension in her brother, brother, brother, brother, and father; Pancreatic cancer in her brother; Peripheral vascular disease in her father; Stroke in her mother; Thyroid  disease in her brother, father, and sister.   ROS:   Please see the history of present illness.    ROS All other systems reviewed and are negative.   PHYSICAL EXAM:   VS:  BP (!) 157/74   Pulse (!) 51   Ht 5\' 5"  (1.651 m)   Wt 154 lb (69.9 kg)   SpO2 97%   BMI 25.63 kg/m    GEN: Well nourished, well developed, in no acute distress  HEENT: normal  Neck: no JVD, carotid bruits, or masses Cardiac: RRR; no murmurs, rubs, or gallops,no edema  Respiratory:  clear to auscultation bilaterally, normal work of breathing GI: soft, nontender, nondistended, + BS MS: no deformity or atrophy  Skin: warm and dry, no rash Neuro:  Alert and Oriented x 3, Strength and sensation are intact Psych: euthymic mood, full affect  Wt Readings from Last 3 Encounters:  11/17/23 154 lb (69.9 kg)  05/03/23 155 lb 3.2 oz (70.4 kg)  11/02/22 155 lb 9.6 oz (70.6 kg)      Studies/Labs Reviewed:   EKG:  EKG is  not  ordered today.   Recent Labs: 05/14/2023: BUN 16; Creatinine, Ser 0.93; Potassium 4.6; Sodium 142   Lipid Panel    Component Value Date/Time   CHOL 123 05/14/2023 0933   TRIG 59 05/14/2023 0933   HDL 63 05/14/2023 0933   CHOLHDL 2.0 05/14/2023 0933   CHOLHDL 2.3 04/19/2020 1536   VLDL 25 07/14/2016 1211   LDLCALC 47 05/14/2023 0933   LDLCALC 69 04/19/2020 1536   Dated 10/12/22: CMET and CBC normal. Cholesterol 142, triglycerides 106, HDL 68, LDL 55.     Additional studies/ records that were reviewed today include:   Echo 08/30/2014 LV EF: 35% -   40% Study Conclusions  - Left ventricle: Poor acoustical windows with multiple wall motion   abnormalities. Recommend limited study with definity  contrast   agent for more accurate assessment. There is distal septal   akinesis. The cavity size was normal. Systolic function was   moderately reduced. The estimated ejection fraction was in the   range of 35% to 40%. There is akinesis of the apical myocardium.   There is akinesis of the apicalanterior myocardium. There is   akinesis of the midanteroseptal myocardium. There is akinesis of   the apicallateral myocardium. There was a reduced contribution of   atrial contraction to ventricular filling, due to increased   ventricular diastolic pressure or atrial contractile dysfunction.   Doppler parameters are consistent with a reversible restrictive   pattern, indicative of decreased left ventricular diastolic   compliance and/or increased left atrial pressure (grade 3   diastolic dysfunction). - Aortic valve: Trileaflet; normal thickness, mildly calcified   leaflets. - Mitral valve: There was mild regurgitation. - Left  atrium: The atrium was mildly dilated. - Pulmonic valve: There was mild regurgitation.   Myoview  07/10/2015 Study Highlights   Nuclear stress EF: 49%. The left ventricular ejection fraction is mildly decreased (45-54%). There was no ST segment deviation noted during  stress. Defect 1: There is a defect present in the mid anteroseptal, mid inferolateral, apical septal, apical lateral and apex location. Findings consistent with prior myocardial infarction. This is a low risk study.   1. Low risk study 2. Scar antero septal, apical and inferolateral. 3. Low nl EF   Myoview  05/23/19: Study Highlights    The left ventricular ejection fraction is mildly decreased (45-54%). Nuclear stress EF: 49%. No T wave inversion was noted during stress. There was no ST segment deviation noted during stress. Defect 1: There is a large defect of severe severity present in the mid anterior, mid anteroseptal, mid inferolateral, apical anterior, apical septal, apical inferior, apical lateral and apex location. Findings consistent with prior myocardial infarction. This is a low risk study.   Large size, severe severity fixed distal anteroseptal, anterior, apical, inferoapical and apical lateral perfusion defect suggestive of scar without significant reversible ischemia (SDS 1). LVEF 49% with distal anterior, anteroseptal and apical akinesis. This is an intermediate risk study. No change compared to prior study in 2017.     ASSESSMENT:    1. Coronary artery disease of bypass graft of native heart with stable angina pectoris (HCC)   2. Essential hypertension   3. S/P CABG x 4   4. Pure hypercholesterolemia       PLAN:  In order of problems listed above:  HTN. BP is high today. Recommend she monitor it more closely at home. Needs to let us  know if staying > 130 systolic.   2.   CAD: Last Myoview  Nov 2020- stable. No ischemia. EF 49%.  She is experiencing some chest tightness. Will update with PET CT  3.   Chronic systolic heart failure: appears well compensated. Last EF 49%. Volume status looks good.   4.   Hyperlipidemia: LDL improved to 47 on high dose Crestor  and Zetia   5.   Ischemic cardiomyopathy: On losartan , unable to tolerate beta-blocker due to baseline  bradycardia.  6. Pancreatic cyst - per GI. MRI stable.   7. Gastric ulcers. On Pepcid     Medication Adjustments/Labs and Tests Ordered: Current medicines are reviewed at length with the patient today.  Concerns regarding medicines are outlined above.  Medication changes, Labs and Tests ordered today are listed in the Patient Instructions below. There are no Patient Instructions on file for this visit.    Signed, Felicitas Sine Swaziland, MD  11/17/2023 11:16 AM    [facility] [address] [place], [place], [place]  [address] Phone: [phone]; Fax: [phone]

## 2023-11-17 ENCOUNTER — Encounter: Payer: Self-pay | Admitting: Cardiology

## 2023-11-17 ENCOUNTER — Ambulatory Visit: Payer: PPO | Attending: Cardiology | Admitting: Cardiology

## 2023-11-17 VITALS — BP 157/74 | HR 51 | Ht 65.0 in | Wt 154.0 lb

## 2023-11-17 DIAGNOSIS — I25708 Atherosclerosis of coronary artery bypass graft(s), unspecified, with other forms of angina pectoris: Secondary | ICD-10-CM | POA: Diagnosis not present

## 2023-11-17 DIAGNOSIS — I10 Essential (primary) hypertension: Secondary | ICD-10-CM

## 2023-11-17 DIAGNOSIS — E7800 Pure hypercholesterolemia, unspecified: Secondary | ICD-10-CM | POA: Diagnosis not present

## 2023-11-17 DIAGNOSIS — Z951 Presence of aortocoronary bypass graft: Secondary | ICD-10-CM

## 2023-11-17 NOTE — Patient Instructions (Addendum)
 Medication Instructions:  Continue same medications *If you need a refill on your cardiac medications before your next appointment, please call your pharmacy*  Lab Work: None ordered  Testing/Procedures: Cardiac Pet CT will be scheduled at [facility] after approved by insurance   Follow instructions below  Follow-Up: At [facility], you and your health needs are our priority.  As part of our continuing mission to provide you with exceptional heart care, our providers are all part of one team.  This team includes your primary Cardiologist (physician) and Advanced Practice Providers or APPs (Physician Assistants and Nurse Practitioners) who all work together to provide you with the care you need, when you need it.  Your next appointment: 6 months   Call in July to schedule Nov appointment      Provider:  Dr.Jordan   Monitor blood pressure at home Call back if continues to be elevated       Please report to Radiology at the [facility] Main Entrance 30 minutes early for your test.  [address] [place], [place] [address]                         OR   Please report to Radiology at [facility] Main Entrance, medical mall, 30 mins prior to your test.  [address]  [place], [place]  How to Prepare for Your Cardiac PET/CT Stress Test:  Nothing to eat or drink, except water , 3 hours prior to arrival time.  NO caffeine/decaffeinated products, or chocolate 12 hours prior to arrival. (Please note decaffeinated beverages (teas/coffees) still contain caffeine).  If you have caffeine within 12 hours prior, the test will need to be rescheduled.  Medication instructions: Hold Lasix  morning of test Do not take nitrates (isosorbide  mononitrate, Ranexa) the day before or day of test Do not take tamsulosin the day before or morning of test Hold theophylline containing medications for 12 hours. Hold Dipyridamole 48 hours prior to  the test.    You may take your remaining medications with water .  NO perfume, cologne or lotion on chest or abdomen area. FEMALES - Please avoid wearing dresses to this appointment.  Total time is 1 to 2 hours; you may want to bring reading material for the waiting time.  IF YOU THINK YOU MAY BE PREGNANT, OR ARE NURSING PLEASE INFORM THE TECHNOLOGIST.  In preparation for your appointment, medication and supplies will be purchased.  Appointment availability is limited, so if you need to cancel or reschedule, please call the Radiology Department Scheduler at [phone] 24 hours in advance to avoid a cancellation fee of $100.00  What to Expect When you Arrive:  Once you arrive and check in for your appointment, you will be taken to a preparation room within the Radiology Department.  A technologist or Nurse will obtain your medical history, verify that you are correctly prepped for the exam, and explain the procedure.  Afterwards, an IV will be started in your arm and electrodes will be placed on your skin for EKG monitoring during the stress portion of the exam. Then you will be escorted to the PET/CT scanner.  There, staff will get you positioned on the scanner and obtain a blood pressure and EKG.  During the exam, you will continue to be connected to the EKG and blood pressure machines.  A small, safe amount of a radioactive tracer will be injected in your IV to obtain  a series of pictures of your heart along with an injection of a stress agent.    After your Exam:  It is recommended that you eat a meal and drink a caffeinated beverage to counter act any effects of the stress agent.  Drink plenty of fluids for the remainder of the day and urinate frequently for the first couple of hours after the exam.  Your doctor will inform you of your test results within 7-10 business days.  For more information and frequently asked questions, please visit our  website: [email or web address]  For questions about your test or how to prepare for your test, please call: Cardiac Imaging Nurse Navigators Office: [phone]   We recommend signing up for the patient portal called "MyChart".  Sign up information is provided on this After Visit Summary.  MyChart is used to connect with patients for Virtual Visits (Telemedicine).  Patients are able to view lab/test results, encounter notes, upcoming appointments, etc.  Non-urgent messages can be sent to your provider as well.   To learn more about what you can do with MyChart, go to [email or web address].

## 2024-02-10 ENCOUNTER — Other Ambulatory Visit: Payer: Self-pay | Admitting: Cardiology

## 2024-02-22 ENCOUNTER — Other Ambulatory Visit (HOSPITAL_COMMUNITY)

## 2024-02-24 ENCOUNTER — Telehealth (HOSPITAL_COMMUNITY): Payer: Self-pay | Admitting: *Deleted

## 2024-02-24 ENCOUNTER — Encounter (HOSPITAL_COMMUNITY): Payer: Self-pay | Admitting: *Deleted

## 2024-02-24 NOTE — Telephone Encounter (Signed)
 MPI instructions sent via [employer].

## 2024-02-29 ENCOUNTER — Encounter: Payer: Self-pay | Admitting: Cardiology

## 2024-03-07 ENCOUNTER — Other Ambulatory Visit: Payer: Self-pay | Admitting: Cardiology

## 2024-03-07 DIAGNOSIS — Z951 Presence of aortocoronary bypass graft: Secondary | ICD-10-CM

## 2024-03-07 DIAGNOSIS — E7800 Pure hypercholesterolemia, unspecified: Secondary | ICD-10-CM

## 2024-03-07 DIAGNOSIS — I25708 Atherosclerosis of coronary artery bypass graft(s), unspecified, with other forms of angina pectoris: Secondary | ICD-10-CM

## 2024-03-07 DIAGNOSIS — I10 Essential (primary) hypertension: Secondary | ICD-10-CM

## 2024-03-09 ENCOUNTER — Ambulatory Visit (HOSPITAL_COMMUNITY)
Admission: RE | Admit: 2024-03-09 | Discharge: 2024-03-09 | Disposition: A | Discharge status: Home / Self Care | Source: Ambulatory Visit | Attending: Cardiology | Admitting: Cardiology

## 2024-03-09 DIAGNOSIS — I25708 Atherosclerosis of coronary artery bypass graft(s), unspecified, with other forms of angina pectoris: Secondary | ICD-10-CM | POA: Diagnosis present

## 2024-03-09 DIAGNOSIS — Z951 Presence of aortocoronary bypass graft: Secondary | ICD-10-CM | POA: Diagnosis not present

## 2024-03-09 DIAGNOSIS — I10 Essential (primary) hypertension: Secondary | ICD-10-CM | POA: Insufficient documentation

## 2024-03-09 DIAGNOSIS — E7800 Pure hypercholesterolemia, unspecified: Secondary | ICD-10-CM | POA: Diagnosis present

## 2024-03-09 LAB — MYOCARDIAL PERFUSION IMAGING
LV dias vol: 129 mL (ref 46–106)
LV sys vol: 57 mL (ref 3.8–5.2)
Nuc Stress EF: 56 %
Peak HR: 91 {beats}/min
Rest HR: 68 {beats}/min
Rest Nuclear Isotope Dose: 10.9 mCi
SDS: 0
SRS: 19
SSS: 18
ST Depression (mm): 0 mm
Stress Nuclear Isotope Dose: 32.9 mCi
TID: 1.49

## 2024-03-09 MED ORDER — REGADENOSON 0.4 MG/5ML IV SOLN
INTRAVENOUS | Status: AC
  Filled 2024-03-09: qty 5

## 2024-03-09 MED ORDER — REGADENOSON 0.4 MG/5ML IV SOLN
0.4000 mg | Freq: Once | INTRAVENOUS | Status: AC
  Administered 2024-03-09: 0.4 mg via INTRAVENOUS

## 2024-03-09 MED ORDER — TECHNETIUM TC 99M TETROFOSMIN IV KIT
32.9000 | PACK | Freq: Once | INTRAVENOUS | Status: AC | PRN
  Administered 2024-03-09: 32.9 via INTRAVENOUS

## 2024-03-09 MED ORDER — TECHNETIUM TC 99M TETROFOSMIN IV KIT
10.9000 | PACK | Freq: Once | INTRAVENOUS | Status: AC | PRN
  Administered 2024-03-09: 10.9 via INTRAVENOUS

## 2024-03-10 ENCOUNTER — Ambulatory Visit: Payer: Self-pay | Admitting: Cardiology

## 2024-05-10 NOTE — Progress Notes (Signed)
 Cardiology Office Note    Date:  05/16/2024   ID:  Kelli Roy, DOB [birth year], MRN [id number]  PCP:  Suanne Pfeiffer, NP  Cardiologist:  Dr. Gilford Lardizabal  Chief Complaint  Patient presents with   Coronary artery disease of bypass graft of native heart wit   Follow-up    History of Present Illness:  Kelli Roy is a 78 y.o. female is seen for follow up.  She has a PMH of CAD, chronic systolic heart failure, hypertension, hyperlipidemia, and history of ischemic cardiomyopathy.  Patient had anterior STEMI in September 2015 and underwent emergent stenting of the proximal LAD with a BMS.  She also had 50 to 70% left main disease, 70 to 80% ostial LAD disease, 50 to 70% ostial left circumflex disease, EF was 25 to 30%.  She was placed on high-dose Lipitor  therapy but noted to have marked elevation of her transaminases and her statin was discontinued.  Since then, she has been tolerating Crestor .  She eventually returned in November 2015 for CABG by Dr. Lucas was LIMA to LAD, SVG to diagonal, SVG to OM, SVG to RCA.  She has a history of a rash reaction to both Brilinta  and Plavix .  Echocardiogram in February 2016 showed EF 35 to 40% with akinesis to the anteroseptum and apex.  She developed acute heart failure in March 2016.  She was diuresed with IV Lasix  and it was discharged home with addition of Lasix  and Aldactone .  She later developed hypotension requiring the discontinuation of Coreg  and Aldactone .  She had a Myoview  in January 2017 that showed scar in the anteroseptal, apical and inferolateral segment without ischemia, EF 49%.  She was seen on 12/02/2017, she was having a very atypical chest discomfort.  She sought medical attention in [facility]  emergency room after having persistent chest soreness for several hours.  Troponin was negative at the time.  EKG showed no significant changes.  Given the atypical nature of her symptoms no further work up recommended.  She was seen in  the ED on 05/06/19 with chest pain and tightness. Ecg showed no acute change and HS troponin negative x 2.  Myoview  was done and showed an area of scar. No ischemia. Unchanged from 2017. EF 49%.   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 EGD done showing gastric inflammation and non bleeding gastric ulcers. Pancreatic cyst biopsied. Bx was apparently benign. Was placed on Pepcid .   When seen last she noted symptoms of chest tightness at times and feels she holds her breath. Not exercising as much. She underwent repeat Myoview  in September and it was unchanged from prior EF 56%. Old scar. No ischemia. Low risk. She notes her sister recently had 3 stents placed.   She brings BP readings. Typically [id number] systolic/ 51-76 diastolic with rare high readings. Notes tightness in chest is better. Has learned not to push things as much.   Past Medical History:  Diagnosis Date   Allergy     Alopecia    Arthritis    CAD (coronary artery disease)    a. 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 Lucas    Cataract    Chronic systolic CHF (congestive heart failure) (  HCC)    a. 03/2014 Echo: EF 25-30%; Pre op TEE 05/2014 with EF of 50%   Family history of cardiovascular disease    Family history of colon cancer    History of long-term treatment with high-risk medication    Hyperlipidemia    Hypertension    Insomnia    Ischemic cardiomyopathy    a. 03/2014 Echo: EF 25-30%, mid-apical/anteroseptal and inf AK, Gr 2 DD, ? apical thrombus, Mild MR.-->Discharged with LifeVest.   Malaise and fatigue    Myocardial infarction (HCC) 03/2014   Shingles 09/18/2019   Shortness of breath dyspnea    Sinus headache     Past Surgical History:  Procedure Laterality Date   BIOPSY   07/03/2020   Procedure: BIOPSY;  Surgeon: Wilhelmenia Aloha Raddle., MD;  Location: [facility];  Service: Gastroenterology;;   CARDIAC CATHETERIZATION  2001   showed no blockages   CATARACT EXTRACTION W/PHACO Left 01/04/2014   Procedure: CATARACT EXTRACTION PHACO AND INTRAOCULAR LENS PLACEMENT (IOC);  Surgeon: Cherene Mania, MD;  Location: [facility];  Service: Ophthalmology;  Laterality: Left;  CDE:5.65   CATARACT EXTRACTION W/PHACO Right 01/29/2014   Procedure: CATARACT EXTRACTION PHACO AND INTRAOCULAR LENS PLACEMENT RIGHT EYE CDE=4.49;  Surgeon: Cherene Mania, MD;  Location: [facility];  Service: Ophthalmology;  Laterality: Right;   CERVICAL SPINE SURGERY  09/27/2003   C6-67 servical fusion   COLONOSCOPY WITH PROPOFOL  N/A 05/18/2016   Procedure: COLONOSCOPY WITH PROPOFOL ;  Surgeon: Gladis MARLA Louder, MD;  Location: [facility];  Service: Endoscopy;  Laterality: N/A;   CORONARY ARTERY BYPASS GRAFT N/A 05/21/2014   Procedure: CORONARY ARTERY BYPASS GRAFTING (CABG);  Surgeon: Dorise MARLA Fellers, MD;  Location: [facility] OR;  Service: Open Heart Surgery;  Laterality: N/A;   CYSTECTOMY  1998   fibroid cysts   DILATION AND CURETTAGE OF UTERUS  1976   2nd to miscarriage   ESOPHAGOGASTRODUODENOSCOPY (EGD) WITH PROPOFOL  N/A 07/03/2020   Procedure: ESOPHAGOGASTRODUODENOSCOPY (EGD) WITH PROPOFOL ;  Surgeon: Wilhelmenia Aloha Raddle., MD;  Location: [facility];  Service: Gastroenterology;  Laterality: N/A;   EUS N/A 07/03/2020   Procedure: UPPER ENDOSCOPIC ULTRASOUND (EUS) RADIAL;  Surgeon: Wilhelmenia Aloha Raddle., MD;  Location: [facility];  Service: Gastroenterology;  Laterality: N/A;   EYE SURGERY     FINE NEEDLE ASPIRATION  07/03/2020   Procedure: FINE NEEDLE ASPIRATION;  Surgeon: Wilhelmenia Aloha Raddle., MD;  Location: [facility];  Service: Gastroenterology;;   INTRAOPERATIVE TRANSESOPHAGEAL ECHOCARDIOGRAM N/A 05/21/2014   Procedure: INTRAOPERATIVE TRANSESOPHAGEAL ECHOCARDIOGRAM;  Surgeon: Dorise MARLA Fellers, MD;   Location: [facility] OR;  Service: Open Heart Surgery;  Laterality: N/A;   LEFT HEART CATHETERIZATION WITH CORONARY ANGIOGRAM N/A 03/08/2014   Procedure: LEFT HEART CATHETERIZATION WITH CORONARY ANGIOGRAM;  Surgeon: Kazimierz Springborn M Frisco Cordts, MD;  Location: [facility] CATH LAB;  Service: Cardiovascular;  Laterality: N/A;   MASTOIDECTOMY  1990   and eardrum repair (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      aspirin  EC 81 MG tablet Take 81 mg by mouth at bedtime.      Cholecalciferol (VITAMIN D3) 50 MCG (2000 UT) capsule  Take 2,000 Units by mouth daily.     Cyanocobalamin (VITAMIN B12 PO) Take 3,000 mcg by mouth daily.     docusate sodium  (COLACE) 100 MG capsule Take 100-200 mg by mouth See admin instructions. Take 100 mg by mouth in the morning and 200 mg at bedtime     ezetimibe  (ZETIA ) 10 MG tablet TAKE 1 TABLET BY MOUTH EVERY DAY 90 tablet 3   famotidine  (PEPCID ) 20 MG tablet TAKE 1 TABLET BY MOUTH TWICE A DAY 180 tablet 0   furosemide  (LASIX ) 20 MG tablet TAKE 1 TABLET BY MOUTH EVERY DAY - MAY TAKE 1 ADDITIONAL TABLET AS NEEDED 135 tablet 2   ipratropium (ATROVENT) 0.06 % nasal spray Place 2 sprays into both nostrils daily as needed for rhinitis.     loratadine (CLARITIN) 10 MG tablet Take 10 mg by mouth daily as needed for allergies or rhinitis.     losartan  (COZAAR ) 50 MG tablet TAKE 1 TABLET BY MOUTH EVERY DAY 90 tablet 2   Magnesium  Cl-Calcium  Carbonate (SLOW-MAG PO) Take 1 tablet by mouth 2 (two) times daily.     mupirocin  ointment (BACTROBAN ) 2 % 3 (three) times daily as needed.      nitroGLYCERIN  (NITROSTAT ) 0.4 MG SL tablet Place 1 tablet (0.4 mg total) under the tongue every 5 (five) minutes as needed for chest pain. 25 tablet 1   NON FORMULARY Take 200 mg by mouth daily. CO Q10 liquid     rosuvastatin  (CRESTOR ) 40 MG tablet Take 1 tablet (40 mg total) by mouth daily. 90 tablet 3   tetrahydrozoline-zinc (VISINE-AC) 0.05-0.25 % ophthalmic solution Place 2 drops into both eyes daily as needed (for dry or irritated eyes).      trolamine salicylate (ASPERCREME/ALOE) 10 % cream Apply 1 application topically daily as needed (bursitis).     No facility-administered medications prior to visit.     Allergies:   Carvedilol , Spironolactone , Clopidogrel , Demerol [meperidine], Lipitor  [atorvastatin ], Lisinopril , Meperidine hcl, Sulfamethoxazole, Thimerosal (thiomersal), Dexilant [dexlansoprazole], Latex, Neomycin , Plavix  [clopidogrel  bisulfate], Sulfonamide derivatives, and Ticagrelor    Social History   Socioeconomic History   Marital status: Married    Spouse name: Not on file   Number of children: Not on file   Years of education: Not on file   Highest education level: Not on file  Occupational History   Not on file  Tobacco Use   Smoking status: Never   Smokeless tobacco: Never  Vaping Use   Vaping status: Never Used  Substance and Sexual Activity   Alcohol use: Not Currently    Comment: rare glass of wine   Drug use: No   Sexual activity: Not Currently    Birth control/protection: Post-menopausal  Other Topics Concern   Not on file  Social History Narrative   Not on file   Social Drivers of Health   Financial Resource Strain: Low Risk  (05/01/2024)   Received from [facility]   Overall Financial Resource Strain (CARDIA)    How hard is it for you to pay for the very basics like food, housing, medical care, and heating?: Not very hard  Food Insecurity: No Food Insecurity (05/01/2024)   [facility]   Hunger Vital Sign    Within the past 12  months, you worried that your food would run out before you got the money to buy more.: Never true    Within the past 12 months, the food you bought just didn't last and you didn't have money to get  more.: Never true  Transportation Needs: No Transportation Needs (05/01/2024)   Received from [facility] - Transportation    In the past 12 months, has lack of transportation kept you from medical appointments or from getting medications?: No    In the past 12 months, has lack of transportation kept you from meetings, work, or from getting things needed for daily living?: No  Physical Activity: Insufficiently Active (05/01/2024)   Received from [facility]   Exercise Vital Sign    On average, how many days per week do you engage in moderate to strenuous exercise (like a brisk walk)?: 2 days    On average, how many minutes do you engage in exercise at this level?: 30 min  Stress: No Stress Concern Present (05/01/2024)   Received from [facility] - Occupational Stress Questionnaire    Do you feel stress - tense, restless, nervous, or anxious, or unable to sleep at night because your mind is troubled all the time - these days?: Only a little  Social Connections: Moderately Integrated (05/01/2024)   Received from [facility]   Social Network    How would you rate your social network (family, work, friends)?: Adequate participation with social networks     Family History:  The patient's family history includes Atrial fibrillation in her father; Cancer in her brother, brother, and father; Colon cancer in her father; Coronary artery disease in her brother and brother; Hearing loss in her father; Heart attack in her mother; Heart disease in her mother; Hypertension in her brother, brother, brother, brother, and father; Pancreatic cancer in her brother; Peripheral vascular disease in her father; Stroke in her mother; Thyroid  disease in her  brother, father, and sister.   ROS:   Please see the history of present illness.    ROS All other systems reviewed and are negative.   PHYSICAL EXAM:   VS:  BP (!) 160/75 (BP Location: Left Arm, Patient Position: Sitting, Cuff Size: Normal)   Pulse 66   Resp 17   Ht 5' 5 (1.651 m)   Wt 147 lb (66.7 kg)   SpO2 98%   BMI 24.46 kg/m    GEN: Well nourished, well developed, in no acute distress  HEENT: normal  Neck: no JVD, carotid bruits, or masses Cardiac: RRR; no murmurs, rubs, or gallops,no edema  Respiratory:  clear to auscultation bilaterally, normal work of breathing GI: soft, nontender, nondistended, + BS MS: no deformity or atrophy  Skin: warm and dry, no rash Neuro:  Alert and Oriented x 3, Strength and sensation are intact Psych: euthymic mood, full affect  Wt Readings from Last 3 Encounters:  05/16/24 147 lb (66.7 kg)  11/17/23 154 lb (69.9 kg)  05/03/23 155 lb 3.2 oz (70.4 kg)      Studies/Labs Reviewed:   EKG Interpretation Date/Time:  Tuesday May 16 2024 10:09:55 EST Ventricular Rate:  64 PR Interval:  162 QRS Duration:  78 QT Interval:  376 QTC Calculation: 387 R Axis:   62  Text Interpretation: Normal sinus rhythm with sinus arrhythmia Low voltage QRS Nonspecific ST abnormality When compared with ECG of 03-May-2023 11:37, No significant change was found Confirmed by Hyatt Capobianco [phone]) on 05/16/2024 10:38:42 AM    Recent Labs: No results found for requested labs within last 365 days.   Lipid Panel    Component Value Date/Time   CHOL 123 05/14/2023 0933   TRIG 59 05/14/2023 0933  HDL 63 05/14/2023 0933   CHOLHDL 2.0 05/14/2023 0933   CHOLHDL 2.3 04/19/2020 1536   VLDL 25 07/14/2016 1211   LDLCALC 47 05/14/2023 0933   LDLCALC 69 04/19/2020 1536   Dated 10/12/22: CMET and CBC normal. Cholesterol 142, triglycerides 106, HDL 68, LDL 55.  Dated 05/01/24: CBC, CMET and TSH normal. Cholesterol 121, triglycerides 77, HDL 66, LDL  40   Additional studies/ records that were reviewed today include:   Echo 08/30/2014 LV EF: 35% -   40% Study Conclusions  - Left ventricle: Poor acoustical windows with multiple wall motion   abnormalities. Recommend limited study with definity  contrast   agent for more accurate assessment. There is distal septal   akinesis. The cavity size was normal. Systolic function was   moderately reduced. The estimated ejection fraction was in the   range of 35% to 40%. There is akinesis of the apical myocardium.   There is akinesis of the apicalanterior myocardium. There is   akinesis of the midanteroseptal myocardium. There is akinesis of   the apicallateral myocardium. There was a reduced contribution of   atrial contraction to ventricular filling, due to increased   ventricular diastolic pressure or atrial contractile dysfunction.   Doppler parameters are consistent with a reversible restrictive   pattern, indicative of decreased left ventricular diastolic   compliance and/or increased left atrial pressure (grade 3   diastolic dysfunction). - Aortic valve: Trileaflet; normal thickness, mildly calcified   leaflets. - Mitral valve: There was mild regurgitation. - Left atrium: The atrium was mildly dilated. - Pulmonic valve: There was mild regurgitation.   Myoview  07/10/2015 Study Highlights   Nuclear stress EF: 49%. The left ventricular ejection fraction is mildly decreased (45-54%). There was no ST segment deviation noted during stress. Defect 1: There is a defect present in the mid anteroseptal, mid inferolateral, apical septal, apical lateral and apex location. Findings consistent with prior myocardial infarction. This is a low risk study.   1. Low risk study 2. Scar antero septal, apical and inferolateral. 3. Low nl EF   Myoview  05/23/19: Study Highlights    The left ventricular ejection fraction is mildly decreased (45-54%). Nuclear stress EF: 49%. No T wave inversion was  noted during stress. There was no ST segment deviation noted during stress. Defect 1: There is a large defect of severe severity present in the mid anterior, mid anteroseptal, mid inferolateral, apical anterior, apical septal, apical inferior, apical lateral and apex location. Findings consistent with prior myocardial infarction. This is a low risk study.   Large size, severe severity fixed distal anteroseptal, anterior, apical, inferoapical and apical lateral perfusion defect suggestive of scar without significant reversible ischemia (SDS 1). LVEF 49% with distal anterior, anteroseptal and apical akinesis. This is an intermediate risk study. No change compared to prior study in 2017.    Myoview  03/09/24: Study Highlights Show Result Comparison    Findings are consistent with no ischemia. The study is intermediate risk.   No ST deviation was noted. The ECG was not diagnostic due to pharmacologic protocol.   LV perfusion is abnormal. There is no evidence of ischemia. There is evidence of infarction. Defect 1: There is a large defect with severe reduction in uptake present in the apical to mid anterior, anteroseptal, inferoseptal, lateral, septal and apex location(s) that is fixed. There is abnormal wall motion in the defect area. Consistent with infarction.   Left ventricular function is abnormal. There were multiple regional abnormalities. Nuclear stress EF: 56%. The  left ventricular ejection fraction is normal (55-65%). End diastolic cavity size is mildly enlarged. End systolic cavity size is normal.   CT images were obtained for attenuation correction and were examined for the presence of coronary calcium  when appropriate.   Coronary calcium  assessment not performed due to prior revascularization.   Prior study available for comparison from 05/23/2019. No changes compared to prior study.   Large area of infarct, unchanged from prior, with no ischemia.   ASSESSMENT:    1. Coronary artery  disease involving native coronary artery of native heart without angina pectoris   2. Essential hypertension   3. S/P CABG x 4   4. Pure hypercholesterolemia        PLAN:  In order of problems listed above:  HTN. BP is high today but readings at home are acceptable. Continue current therapy  2.   CAD: recent Myoview  looks stable from 2020 with old scar. No ischemia. EF normal 56%. Will continue current management unless symptoms accelerate.   3.   Chronic systolic heart failure: appears well compensated. Last EF 56%. Volume status looks good.   4.   Hyperlipidemia: LDL improved to 40 on high dose Crestor  and Zetia   5.   Ischemic cardiomyopathy: On losartan , unable to tolerate beta-blocker due to baseline bradycardia.  6. Pancreatic cyst - per GI. MRI stable.   7. Gastric ulcers. On Pepcid     Follow up in 6 months\   Signed, Denora Wysocki, MD  05/16/2024 10:50 AM    [facility] [address] [place], [place], [place]  [address] Phone: [phone]; Fax: [phone]

## 2024-05-16 ENCOUNTER — Ambulatory Visit: Attending: Cardiology | Admitting: Cardiology

## 2024-05-16 ENCOUNTER — Encounter: Payer: Self-pay | Admitting: Cardiology

## 2024-05-16 VITALS — BP 160/75 | HR 66 | Resp 17 | Ht 65.0 in | Wt 147.0 lb

## 2024-05-16 DIAGNOSIS — Z951 Presence of aortocoronary bypass graft: Secondary | ICD-10-CM | POA: Diagnosis not present

## 2024-05-16 DIAGNOSIS — I10 Essential (primary) hypertension: Secondary | ICD-10-CM | POA: Diagnosis not present

## 2024-05-16 DIAGNOSIS — E7800 Pure hypercholesterolemia, unspecified: Secondary | ICD-10-CM

## 2024-05-16 DIAGNOSIS — I2510 Atherosclerotic heart disease of native coronary artery without angina pectoris: Secondary | ICD-10-CM | POA: Diagnosis not present

## 2024-05-16 NOTE — Patient Instructions (Addendum)
 Medication Instructions:  Continue same medications *If you need a refill on your cardiac medications before your next appointment, please call your pharmacy*  Lab Work: None ordered  Testing/Procedures: None ordered  Follow-Up: At [facility], you and your health needs are our priority.  As part of our continuing mission to provide you with exceptional heart care, our providers are all part of one team.  This team includes your primary Cardiologist (physician) and Advanced Practice Providers or APPs (Physician Assistants and Nurse Practitioners) who all work together to provide you with the care you need, when you need it.  Your next appointment:  6 months    Call in Feb to schedule May appointment     Provider:  Dr.Jordan   We recommend signing up for the patient portal called MyChart.  Sign up information is provided on this After Visit Summary.  MyChart is used to connect with patients for Virtual Visits (Telemedicine).  Patients are able to view lab/test results, encounter notes, upcoming appointments, etc.  Non-urgent messages can be sent to your provider as well.   To learn more about what you can do with MyChart, go to [email or web address].

## 2024-06-22 ENCOUNTER — Other Ambulatory Visit: Payer: Self-pay | Admitting: Cardiology

## 2024-06-22 ENCOUNTER — Other Ambulatory Visit: Payer: Self-pay | Admitting: General Practice
